# Patient Record
Sex: Female | Born: 1980 | Race: Black or African American | Hispanic: No | Smoking: Current every day smoker
Health system: Southern US, Community
[De-identification: ages and names within clinical notes are randomized; demographics above are authoritative.]

## PROBLEM LIST (undated history)

## (undated) ENCOUNTER — Inpatient Hospital Stay (HOSPITAL_COMMUNITY): Payer: Self-pay

## (undated) DIAGNOSIS — M1990 Unspecified osteoarthritis, unspecified site: Secondary | ICD-10-CM

## (undated) DIAGNOSIS — I10 Essential (primary) hypertension: Secondary | ICD-10-CM

## (undated) DIAGNOSIS — IMO0001 Reserved for inherently not codable concepts without codable children: Secondary | ICD-10-CM

## (undated) DIAGNOSIS — B009 Herpesviral infection, unspecified: Secondary | ICD-10-CM

## (undated) DIAGNOSIS — F419 Anxiety disorder, unspecified: Secondary | ICD-10-CM

## (undated) DIAGNOSIS — E119 Type 2 diabetes mellitus without complications: Secondary | ICD-10-CM

## (undated) DIAGNOSIS — E282 Polycystic ovarian syndrome: Secondary | ICD-10-CM

## (undated) DIAGNOSIS — F17200 Nicotine dependence, unspecified, uncomplicated: Secondary | ICD-10-CM

## (undated) DIAGNOSIS — N83202 Unspecified ovarian cyst, left side: Secondary | ICD-10-CM

## (undated) DIAGNOSIS — O139 Gestational [pregnancy-induced] hypertension without significant proteinuria, unspecified trimester: Secondary | ICD-10-CM

## (undated) HISTORY — PX: WISDOM TOOTH EXTRACTION: SHX21

## (undated) HISTORY — DX: Polycystic ovarian syndrome: E28.2

---

## 2004-05-29 HISTORY — PX: DILATION AND CURETTAGE OF UTERUS: SHX78

## 2008-02-25 ENCOUNTER — Emergency Department (HOSPITAL_COMMUNITY): Admission: EM | Admit: 2008-02-25 | Discharge: 2008-02-26 | Payer: Self-pay | Admitting: *Deleted

## 2009-01-13 ENCOUNTER — Encounter: Admission: RE | Admit: 2009-01-13 | Discharge: 2009-01-13 | Payer: Self-pay | Admitting: Emergency Medicine

## 2009-01-19 ENCOUNTER — Encounter: Admission: RE | Admit: 2009-01-19 | Discharge: 2009-01-19 | Payer: Self-pay | Admitting: Emergency Medicine

## 2009-09-07 ENCOUNTER — Inpatient Hospital Stay (HOSPITAL_COMMUNITY): Admission: AD | Admit: 2009-09-07 | Discharge: 2009-09-07 | Payer: Self-pay | Admitting: Obstetrics and Gynecology

## 2009-10-05 ENCOUNTER — Ambulatory Visit (HOSPITAL_COMMUNITY): Admission: RE | Admit: 2009-10-05 | Discharge: 2009-10-05 | Payer: Self-pay | Admitting: Obstetrics and Gynecology

## 2009-10-08 ENCOUNTER — Encounter: Admission: RE | Admit: 2009-10-08 | Discharge: 2009-10-08 | Payer: Self-pay | Admitting: Obstetrics and Gynecology

## 2009-11-03 ENCOUNTER — Ambulatory Visit (HOSPITAL_COMMUNITY): Admission: RE | Admit: 2009-11-03 | Discharge: 2009-11-03 | Payer: Self-pay | Admitting: Obstetrics and Gynecology

## 2009-12-01 ENCOUNTER — Ambulatory Visit (HOSPITAL_COMMUNITY): Admission: RE | Admit: 2009-12-01 | Discharge: 2009-12-01 | Payer: Self-pay | Admitting: Obstetrics and Gynecology

## 2009-12-08 ENCOUNTER — Encounter: Admission: RE | Admit: 2009-12-08 | Discharge: 2009-12-21 | Payer: Self-pay | Admitting: Obstetrics and Gynecology

## 2010-01-04 ENCOUNTER — Ambulatory Visit (HOSPITAL_COMMUNITY): Admission: RE | Admit: 2010-01-04 | Discharge: 2010-01-04 | Payer: Self-pay | Admitting: Obstetrics and Gynecology

## 2010-02-01 ENCOUNTER — Ambulatory Visit (HOSPITAL_COMMUNITY): Admission: RE | Admit: 2010-02-01 | Discharge: 2010-02-01 | Payer: Self-pay | Admitting: Obstetrics and Gynecology

## 2010-02-04 ENCOUNTER — Inpatient Hospital Stay (HOSPITAL_COMMUNITY): Admission: AD | Admit: 2010-02-04 | Discharge: 2010-02-19 | Payer: Self-pay | Admitting: Obstetrics and Gynecology

## 2010-02-14 ENCOUNTER — Ambulatory Visit (HOSPITAL_COMMUNITY): Admission: RE | Admit: 2010-02-14 | Discharge: 2010-02-14 | Payer: Self-pay | Admitting: Obstetrics and Gynecology

## 2010-02-21 ENCOUNTER — Inpatient Hospital Stay (HOSPITAL_COMMUNITY): Admission: AD | Admit: 2010-02-21 | Discharge: 2010-02-21 | Payer: Self-pay | Admitting: Obstetrics and Gynecology

## 2010-03-01 ENCOUNTER — Inpatient Hospital Stay (HOSPITAL_COMMUNITY): Admission: AD | Admit: 2010-03-01 | Discharge: 2010-03-01 | Payer: Self-pay | Admitting: Obstetrics and Gynecology

## 2010-03-05 ENCOUNTER — Encounter (INDEPENDENT_AMBULATORY_CARE_PROVIDER_SITE_OTHER): Payer: Self-pay | Admitting: Obstetrics and Gynecology

## 2010-03-05 ENCOUNTER — Inpatient Hospital Stay (HOSPITAL_COMMUNITY): Admission: AD | Admit: 2010-03-05 | Discharge: 2010-03-07 | Payer: Self-pay | Admitting: Obstetrics and Gynecology

## 2010-06-18 ENCOUNTER — Encounter: Payer: Self-pay | Admitting: Obstetrics and Gynecology

## 2010-06-19 ENCOUNTER — Encounter: Payer: Self-pay | Admitting: Obstetrics and Gynecology

## 2010-06-19 ENCOUNTER — Encounter: Payer: Self-pay | Admitting: Otolaryngology

## 2010-08-11 LAB — COMPREHENSIVE METABOLIC PANEL
ALT: 14 U/L (ref 0–35)
ALT: 10 U/L (ref 0–35)
ALT: 12 U/L (ref 0–35)
ALT: 18 U/L (ref 0–35)
AST: 16 U/L (ref 0–37)
AST: 14 U/L (ref 0–37)
AST: 8 U/L (ref 0–37)
Albumin: 2.7 g/dL — ABNORMAL LOW (ref 3.5–5.2)
Alkaline Phosphatase: 83 U/L (ref 39–117)
Alkaline Phosphatase: 86 U/L (ref 39–117)
BUN: 8 mg/dL (ref 6–23)
CO2: 22 mEq/L (ref 19–32)
CO2: 20 mEq/L (ref 19–32)
Calcium: 9.2 mg/dL (ref 8.4–10.5)
Calcium: 9.5 mg/dL (ref 8.4–10.5)
Calcium: 9.8 mg/dL (ref 8.4–10.5)
Chloride: 108 mEq/L (ref 96–112)
Chloride: 110 mEq/L (ref 96–112)
Creatinine, Ser: 0.38 mg/dL — ABNORMAL LOW (ref 0.4–1.2)
GFR calc Af Amer: >60 mL/min (ref >60)
GFR calc Af Amer: >60 mL/min (ref >60)
GFR calc Af Amer: >60 mL/min (ref >60)
GFR calc non Af Amer: >60 mL/min (ref >60)
GFR calc non Af Amer: >60 mL/min (ref >60)
Glucose, Bld: 142 mg/dL — ABNORMAL HIGH (ref 70–99)
Glucose, Bld: 441 mg/dL — ABNORMAL HIGH (ref 70–99)
Potassium: 3.2 mEq/L — ABNORMAL LOW (ref 3.5–5.1)
Potassium: 3.7 mEq/L (ref 3.5–5.1)
Sodium: 138 mEq/L (ref 135–145)
Sodium: 127 mEq/L — ABNORMAL LOW (ref 135–145)
Sodium: 131 mEq/L — ABNORMAL LOW (ref 135–145)
Sodium: 136 mEq/L (ref 135–145)
Total Bilirubin: 0.2 mg/dL — ABNORMAL LOW (ref 0.3–1.2)
Total Protein: 6.3 g/dL (ref 6.0–8.3)
Total Protein: 6.1 g/dL (ref 6.0–8.3)
Total Protein: 6.1 g/dL (ref 6.0–8.3)

## 2010-08-11 LAB — GLUCOSE, CAPILLARY
Glucose-Capillary: 118 mg/dL — ABNORMAL HIGH (ref 70–99)
Glucose-Capillary: 76 mg/dL (ref 70–99)
Glucose-Capillary: 78 mg/dL (ref 70–99)
Glucose-Capillary: 82 mg/dL (ref 70–99)
Glucose-Capillary: 103 mg/dL — ABNORMAL HIGH (ref 70–99)
Glucose-Capillary: 106 mg/dL — ABNORMAL HIGH (ref 70–99)
Glucose-Capillary: 113 mg/dL — ABNORMAL HIGH (ref 70–99)
Glucose-Capillary: 113 mg/dL — ABNORMAL HIGH (ref 70–99)
Glucose-Capillary: 114 mg/dL — ABNORMAL HIGH (ref 70–99)
Glucose-Capillary: 115 mg/dL — ABNORMAL HIGH (ref 70–99)
Glucose-Capillary: 117 mg/dL — ABNORMAL HIGH (ref 70–99)
Glucose-Capillary: 119 mg/dL — ABNORMAL HIGH (ref 70–99)
Glucose-Capillary: 124 mg/dL — ABNORMAL HIGH (ref 70–99)
Glucose-Capillary: 131 mg/dL — ABNORMAL HIGH (ref 70–99)
Glucose-Capillary: 132 mg/dL — ABNORMAL HIGH (ref 70–99)
Glucose-Capillary: 139 mg/dL — ABNORMAL HIGH (ref 70–99)
Glucose-Capillary: 140 mg/dL — ABNORMAL HIGH (ref 70–99)
Glucose-Capillary: 141 mg/dL — ABNORMAL HIGH (ref 70–99)
Glucose-Capillary: 142 mg/dL — ABNORMAL HIGH (ref 70–99)
Glucose-Capillary: 147 mg/dL — ABNORMAL HIGH (ref 70–99)
Glucose-Capillary: 154 mg/dL — ABNORMAL HIGH (ref 70–99)
Glucose-Capillary: 155 mg/dL — ABNORMAL HIGH (ref 70–99)
Glucose-Capillary: 161 mg/dL — ABNORMAL HIGH (ref 70–99)
Glucose-Capillary: 161 mg/dL — ABNORMAL HIGH (ref 70–99)
Glucose-Capillary: 162 mg/dL — ABNORMAL HIGH (ref 70–99)
Glucose-Capillary: 163 mg/dL — ABNORMAL HIGH (ref 70–99)
Glucose-Capillary: 170 mg/dL — ABNORMAL HIGH (ref 70–99)
Glucose-Capillary: 175 mg/dL — ABNORMAL HIGH (ref 70–99)
Glucose-Capillary: 178 mg/dL — ABNORMAL HIGH (ref 70–99)
Glucose-Capillary: 70 mg/dL (ref 70–99)
Glucose-Capillary: 81 mg/dL (ref 70–99)
Glucose-Capillary: 87 mg/dL (ref 70–99)
Glucose-Capillary: 90 mg/dL (ref 70–99)
Glucose-Capillary: 92 mg/dL (ref 70–99)
Glucose-Capillary: 94 mg/dL (ref 70–99)
Glucose-Capillary: 119 mg/dL — ABNORMAL HIGH (ref 70–99)
Glucose-Capillary: 125 mg/dL — ABNORMAL HIGH (ref 70–99)
Glucose-Capillary: 136 mg/dL — ABNORMAL HIGH (ref 70–99)
Glucose-Capillary: 138 mg/dL — ABNORMAL HIGH (ref 70–99)
Glucose-Capillary: 147 mg/dL — ABNORMAL HIGH (ref 70–99)
Glucose-Capillary: 153 mg/dL — ABNORMAL HIGH (ref 70–99)
Glucose-Capillary: 183 mg/dL — ABNORMAL HIGH (ref 70–99)
Glucose-Capillary: 190 mg/dL — ABNORMAL HIGH (ref 70–99)
Glucose-Capillary: 196 mg/dL — ABNORMAL HIGH (ref 70–99)
Glucose-Capillary: 206 mg/dL — ABNORMAL HIGH (ref 70–99)
Glucose-Capillary: 220 mg/dL — ABNORMAL HIGH (ref 70–99)
Glucose-Capillary: 252 mg/dL — ABNORMAL HIGH (ref 70–99)
Glucose-Capillary: 264 mg/dL — ABNORMAL HIGH (ref 70–99)
Glucose-Capillary: 265 mg/dL — ABNORMAL HIGH (ref 70–99)
Glucose-Capillary: 279 mg/dL — ABNORMAL HIGH (ref 70–99)
Glucose-Capillary: 301 mg/dL — ABNORMAL HIGH (ref 70–99)
Glucose-Capillary: 302 mg/dL — ABNORMAL HIGH (ref 70–99)
Glucose-Capillary: 320 mg/dL — ABNORMAL HIGH (ref 70–99)
Glucose-Capillary: 324 mg/dL — ABNORMAL HIGH (ref 70–99)
Glucose-Capillary: 333 mg/dL — ABNORMAL HIGH (ref 70–99)
Glucose-Capillary: 346 mg/dL — ABNORMAL HIGH (ref 70–99)
Glucose-Capillary: 357 mg/dL — ABNORMAL HIGH (ref 70–99)
Glucose-Capillary: 425 mg/dL — ABNORMAL HIGH (ref 70–99)

## 2010-08-11 LAB — DIFFERENTIAL
Eosinophils Absolute: 0.1 10*3/uL (ref 0.0–0.7)
Lymphocytes Relative: 18 % (ref 12–46)
Lymphs Abs: 1.6 10*3/uL (ref 0.7–4.0)
Monocytes Relative: 9 % (ref 3–12)
Neutrophils Relative %: 72 % (ref 43–77)

## 2010-08-11 LAB — URINALYSIS, ROUTINE W REFLEX MICROSCOPIC
Bilirubin Urine: NEGATIVE
Glucose, UA: >1000 mg/dL — AB
Specific Gravity, Urine: 1.025 (ref 1.005–1.030)
pH: 5.5 (ref 5.0–8.0)

## 2010-08-11 LAB — STREP B DNA PROBE

## 2010-08-11 LAB — WET PREP, GENITAL
Clue Cells Wet Prep HPF POC: NONE SEEN
Trich, Wet Prep: NONE SEEN
Yeast Wet Prep HPF POC: NONE SEEN

## 2010-08-11 LAB — FETAL FIBRONECTIN: Fetal Fibronectin: NEGATIVE

## 2010-08-11 LAB — URINE MICROSCOPIC-ADD ON

## 2010-08-11 LAB — BASIC METABOLIC PANEL
BUN: 7 mg/dL (ref 6–23)
Chloride: 108 mEq/L (ref 96–112)
Glucose, Bld: 146 mg/dL — ABNORMAL HIGH (ref 70–99)
Potassium: 3.4 mEq/L — ABNORMAL LOW (ref 3.5–5.1)

## 2010-08-11 LAB — CBC
HCT: 33 % — ABNORMAL LOW (ref 36.0–46.0)
HCT: 40.4 % (ref 36.0–46.0)
Hemoglobin: 11.1 g/dL — ABNORMAL LOW (ref 12.0–15.0)
MCHC: 33.5 g/dL (ref 30.0–36.0)
MCHC: 35.7 g/dL (ref 30.0–36.0)
MCV: 86.2 fL (ref 78.0–100.0)
RDW: 17 % — ABNORMAL HIGH (ref 11.5–15.5)
RDW: 17.2 % — ABNORMAL HIGH (ref 11.5–15.5)
RDW: 16.7 % — ABNORMAL HIGH (ref 11.5–15.5)
WBC: 11 10*3/uL — ABNORMAL HIGH (ref 4.0–10.5)

## 2010-08-11 LAB — PROTEIN, URINE, 24 HOUR
Collection Interval-UPROT: 24 hours
Urine Total Volume-UPROT: 1900 mL

## 2010-08-11 LAB — WOUND CULTURE

## 2010-08-11 LAB — LACTATE DEHYDROGENASE: LDH: 164 U/L (ref 94–250)

## 2010-08-11 LAB — AMYLASE: Amylase: 62 U/L (ref 0–105)

## 2010-08-16 LAB — BASIC METABOLIC PANEL
CO2: 24 mEq/L (ref 19–32)
Chloride: 104 mEq/L (ref 96–112)
GFR calc non Af Amer: >60 mL/min (ref >60)
Glucose, Bld: 212 mg/dL — ABNORMAL HIGH (ref 70–99)
Potassium: 3.5 mEq/L (ref 3.5–5.1)
Sodium: 134 mEq/L — ABNORMAL LOW (ref 135–145)

## 2010-08-16 LAB — CBC
HCT: 35.3 % — ABNORMAL LOW (ref 36.0–46.0)
Hemoglobin: 12 g/dL (ref 12.0–15.0)
MCHC: 34 g/dL (ref 30.0–36.0)
RDW: 16.3 % — ABNORMAL HIGH (ref 11.5–15.5)

## 2010-08-16 LAB — GLUCOSE, CAPILLARY

## 2010-08-17 LAB — WET PREP, GENITAL
Trich, Wet Prep: NONE SEEN
Yeast Wet Prep HPF POC: NONE SEEN

## 2010-08-21 ENCOUNTER — Inpatient Hospital Stay (HOSPITAL_COMMUNITY)
Admission: AD | Admit: 2010-08-21 | Discharge: 2010-08-22 | Disposition: A | Discharge status: Home / Self Care | Payer: 59 | Source: Ambulatory Visit | Attending: Obstetrics and Gynecology | Admitting: Obstetrics and Gynecology

## 2010-08-21 DIAGNOSIS — I10 Essential (primary) hypertension: Secondary | ICD-10-CM | POA: Insufficient documentation

## 2010-08-21 DIAGNOSIS — K5900 Constipation, unspecified: Secondary | ICD-10-CM | POA: Insufficient documentation

## 2010-08-21 DIAGNOSIS — G44209 Tension-type headache, unspecified, not intractable: Secondary | ICD-10-CM | POA: Insufficient documentation

## 2010-08-21 LAB — URINALYSIS, ROUTINE W REFLEX MICROSCOPIC
Glucose, UA: NEGATIVE mg/dL
Ketones, ur: NEGATIVE mg/dL
Leukocytes, UA: NEGATIVE
pH: 5.5 (ref 5.0–8.0)

## 2010-08-21 LAB — URINE MICROSCOPIC-ADD ON

## 2010-08-21 LAB — POCT PREGNANCY, URINE: Preg Test, Ur: NEGATIVE

## 2010-08-22 LAB — CBC
HCT: 40.2 % (ref 36.0–46.0)
MCHC: 32.8 g/dL (ref 30.0–36.0)
MCV: 83.9 fL (ref 78.0–100.0)
Platelets: 225 10*3/uL (ref 150–400)
RDW: 15 % (ref 11.5–15.5)

## 2010-08-22 LAB — COMPREHENSIVE METABOLIC PANEL
BUN: 10 mg/dL (ref 6–23)
Calcium: 9.3 mg/dL (ref 8.4–10.5)
Creatinine, Ser: 0.68 mg/dL (ref 0.4–1.2)
Glucose, Bld: 114 mg/dL — ABNORMAL HIGH (ref 70–99)
Total Protein: 7.3 g/dL (ref 6.0–8.3)

## 2010-08-22 LAB — WET PREP, GENITAL
Trich, Wet Prep: NONE SEEN
Yeast Wet Prep HPF POC: NONE SEEN

## 2010-08-22 LAB — LIPASE, BLOOD: Lipase: 31 U/L (ref 11–59)

## 2010-08-22 LAB — AMYLASE: Amylase: 52 U/L (ref 0–105)

## 2010-08-23 LAB — GC/CHLAMYDIA PROBE AMP, GENITAL
Chlamydia, DNA Probe: NEGATIVE
GC Probe Amp, Genital: NEGATIVE

## 2011-01-15 ENCOUNTER — Emergency Department (HOSPITAL_COMMUNITY)
Admission: EM | Admit: 2011-01-15 | Discharge: 2011-01-15 | Disposition: A | Discharge status: Home / Self Care | Payer: No Typology Code available for payment source | Attending: Emergency Medicine | Admitting: Emergency Medicine

## 2011-01-15 DIAGNOSIS — R51 Headache: Secondary | ICD-10-CM | POA: Insufficient documentation

## 2011-02-27 LAB — POCT I-STAT, CHEM 8
BUN: 9
Calcium, Ion: 1.21
Chloride: 108
Creatinine, Ser: 1
Glucose, Bld: 92

## 2011-02-27 LAB — HEMOGLOBIN AND HEMATOCRIT, BLOOD: Hemoglobin: 13.5

## 2011-02-27 LAB — APTT: aPTT: 34

## 2011-05-21 ENCOUNTER — Encounter (HOSPITAL_COMMUNITY): Payer: Self-pay | Admitting: *Deleted

## 2011-05-21 ENCOUNTER — Inpatient Hospital Stay (HOSPITAL_COMMUNITY)
Admission: AD | Admit: 2011-05-21 | Discharge: 2011-05-21 | Disposition: A | Discharge status: Home / Self Care | Payer: Medicaid Other | Source: Ambulatory Visit | Attending: Obstetrics and Gynecology | Admitting: Obstetrics and Gynecology

## 2011-05-21 DIAGNOSIS — N949 Unspecified condition associated with female genital organs and menstrual cycle: Secondary | ICD-10-CM | POA: Insufficient documentation

## 2011-05-21 DIAGNOSIS — N938 Other specified abnormal uterine and vaginal bleeding: Secondary | ICD-10-CM | POA: Insufficient documentation

## 2011-05-21 DIAGNOSIS — A5901 Trichomonal vulvovaginitis: Secondary | ICD-10-CM | POA: Insufficient documentation

## 2011-05-21 DIAGNOSIS — A599 Trichomoniasis, unspecified: Secondary | ICD-10-CM

## 2011-05-21 HISTORY — DX: Essential (primary) hypertension: I10

## 2011-05-21 HISTORY — DX: Gestational (pregnancy-induced) hypertension without significant proteinuria, unspecified trimester: O13.9

## 2011-05-21 HISTORY — DX: Herpesviral infection, unspecified: B00.9

## 2011-05-21 LAB — URINALYSIS, ROUTINE W REFLEX MICROSCOPIC
Bilirubin Urine: NEGATIVE
Glucose, UA: NEGATIVE mg/dL
Ketones, ur: NEGATIVE mg/dL
Leukocytes, UA: NEGATIVE
Nitrite: NEGATIVE
Protein, ur: NEGATIVE mg/dL
Specific Gravity, Urine: >1.03 — ABNORMAL HIGH (ref 1.005–1.030)
Urobilinogen, UA: 0.2 mg/dL (ref 0.0–1.0)
pH: 6 (ref 5.0–8.0)

## 2011-05-21 LAB — CBC
HCT: 42.4 % (ref 36.0–46.0)
MCHC: 32.3 g/dL (ref 30.0–36.0)
MCV: 85 fL (ref 78.0–100.0)
RDW: 14.8 % (ref 11.5–15.5)

## 2011-05-21 LAB — WET PREP, GENITAL

## 2011-05-21 LAB — HCG, SERUM, QUALITATIVE: Preg, Serum: NEGATIVE

## 2011-05-21 MED ORDER — METRONIDAZOLE 500 MG PO TABS: 500.0000 mg | ORAL_TABLET | Freq: Two times a day (BID) | ORAL | Status: AC

## 2011-05-21 NOTE — ED Provider Notes (Signed)
History     Chief Complaint  Patient presents with  . Vaginal Bleeding   HPI  Pt is not pregnant and is concerned that she started her period this month one week early on Dec 13 only spotting since then ; she has had mild cramps.  She is concerned that she might be pregnant.  She took 2 days ago- one negative 2 days ago and faint positive yesterday.  She denies vaginal discharge.  She had intercourse 2 days ago without pain.  She denies pain with urination or constipation or diarrhea.  She feels like she is breaking out in a sweat- which she felt like when she was pregnant with her daughter.   She is a pt of [facility]- last seen November because she was one week late for her period.  She had a serum blood test that was negative.  She has a history of PCOS with irregular periods every 3 months but has has had regular periods since Oct 2011 when her daughter was born. She had gestational diabetes.  She has a history of taking Metformin prior to her pregnancy and then through her pregnancy.   She has gained about 30 to 40 lbs since her daughter was born.  She also complains of being extra tired.  She has an appointment on Friday Dec 28 with [facility].  Past Medical History  Diagnosis Date  . Gestational diabetes   . Hypertension   . Pregnancy induced hypertension   . Herpes     Past Surgical History  Procedure Date  . Cesarean section   . Wisdom tooth extraction     Family History  Problem Relation Age of Onset  . Hypertension Mother   . Diabetes Father   . Stroke Father   . Cancer Maternal Uncle   . Cancer Maternal Grandmother     History  Substance Use Topics  . Smoking status: Current Everyday Smoker -- 0.5 packs/day for 13 years    Types: Cigarettes  . Smokeless tobacco: Not on file  . Alcohol Use: Yes     socially and rarely    Allergies: Allergies not on file  No prescriptions prior to admission    ROS Physical Exam   Blood pressure 157/86, pulse 100, temperature 98.7 F  (37.1 C), temperature source Oral, resp. rate 18, height 5' 7.5" (1.715 m), weight 310 lb (140.615 kg), last menstrual period 04/19/2011, SpO2 96.00%.  Physical Exam  Nursing note and vitals reviewed. Constitutional: She is oriented to person, place, and time. She appears well-developed and well-nourished.  Eyes: Pupils are equal, round, and reactive to light.  Neck: Normal range of motion. Neck supple.  Respiratory: Effort normal.  GI: Soft. She exhibits no distension and no mass. There is no tenderness. There is no rebound and no guarding.  Genitourinary:       Small amount of frothy blood in vault; cervix clean, NT; uterus nontender- unable to appreciate size due to habitus; adnexa without palpable enlargement; nonender  Musculoskeletal: Normal range of motion.  Neurological: She is alert and oriented to person, place, and time.  Skin: Skin is warm and dry.  Psychiatric: She has a normal mood and affect.    MAU Course  Procedures Urine pregnancy test negative but since pt has had a faint positive pregnancy test, a serum pregnancy test was ordered CBC Wet prep GC/chlamdyia-pending Care turned over to [facility], [place] G/chlm Assessment and Plan    LINEBERRY,SUSAN 05/21/2011, 8:01 PM   I accepted  care of this pt from Pamelia Hoit, FNP.  Results for orders placed during the hospital encounter of 05/21/11 (from the past 24 hour(s))  URINALYSIS, ROUTINE W REFLEX MICROSCOPIC     Status: Abnormal   Collection Time   05/21/11  7:40 PM      Component Value Range   Color, Urine YELLOW  YELLOW    APPearance CLEAR  CLEAR    Specific Gravity, Urine >1.030 (*) 1.005 - 1.030    pH 6.0  5.0 - 8.0    Glucose, UA NEGATIVE  NEGATIVE (mg/dL)   Hgb urine dipstick MODERATE (*) NEGATIVE    Bilirubin Urine NEGATIVE  NEGATIVE    Ketones, ur NEGATIVE  NEGATIVE (mg/dL)   Protein, ur NEGATIVE  NEGATIVE (mg/dL)   Urobilinogen, UA 0.2  0.0 - 1.0 (mg/dL)   Nitrite NEGATIVE  NEGATIVE     Leukocytes, UA NEGATIVE  NEGATIVE   URINE MICROSCOPIC-ADD ON     Status: Abnormal   Collection Time   05/21/11  7:40 PM      Component Value Range   Squamous Epithelial / LPF FEW (*) RARE    WBC, UA 0-2  <3 (WBC/hpf)   RBC / HPF 3-6  <3 (RBC/hpf)   Bacteria, UA FEW (*) RARE   POCT PREGNANCY, URINE     Status: Normal   Collection Time   05/21/11  8:00 PM      Component Value Range   Preg Test, Ur NEGATIVE    HCG, SERUM, QUALITATIVE     Status: Normal   Collection Time   05/21/11  8:26 PM      Component Value Range   Preg, Serum NEGATIVE  NEGATIVE   CBC     Status: Normal   Collection Time   05/21/11  8:26 PM      Component Value Range   WBC 8.7  4.0 - 10.5 (K/uL)   RBC 4.99  3.87 - 5.11 (MIL/uL)   Hemoglobin 13.7  12.0 - 15.0 (g/dL)   HCT 16.1  09.6 - 04.5 (%)   MCV 85.0  78.0 - 100.0 (fL)   MCH 27.5  26.0 - 34.0 (pg)   MCHC 32.3  30.0 - 36.0 (g/dL)   RDW 40.9  81.1 - 91.4 (%)   Platelets 203  150 - 400 (K/uL)  WET PREP, GENITAL     Status: Abnormal   Collection Time   05/21/11  8:40 PM      Component Value Range   Yeast, Wet Prep NONE SEEN  NONE SEEN    Trich, Wet Prep FEW (*) NONE SEEN    Clue Cells, Wet Prep MODERATE (*) NONE SEEN    WBC, Wet Prep HPF POC FEW (*) NONE SEEN      A/P: 1) Trichomonas: discussed with pt at length. Will give Rx for Flagyl. Warned of antabuse reaction. She has f/u scheduled with Dr. Cloretta Ned office. Discussed safe sex practices. Discussed diet, activity, risks, and precautions.  Clinton Gallant. Hatcher Froning III, DrHSc, MPAS, PA-C   Henrietta Hoover, [place] 05/21/11 2121

## 2011-05-21 NOTE — Progress Notes (Signed)
Last normal period was 18 Apr 2011, has been spotting with light cramping for the past 10 days.

## 2011-05-21 NOTE — Progress Notes (Signed)
Pt states she has had spotting for the last week-she also has nausea at times followed by sweating and tingling in her fingers

## 2011-11-01 ENCOUNTER — Emergency Department (HOSPITAL_COMMUNITY)
Admission: EM | Admit: 2011-11-01 | Discharge: 2011-11-01 | Disposition: A | Discharge status: Home / Self Care | Payer: Self-pay | Attending: Emergency Medicine | Admitting: Emergency Medicine

## 2011-11-01 ENCOUNTER — Encounter (HOSPITAL_COMMUNITY): Payer: Self-pay | Admitting: *Deleted

## 2011-11-01 DIAGNOSIS — R7309 Other abnormal glucose: Secondary | ICD-10-CM | POA: Insufficient documentation

## 2011-11-01 DIAGNOSIS — N643 Galactorrhea not associated with childbirth: Secondary | ICD-10-CM

## 2011-11-01 LAB — DIFFERENTIAL
Basophils Absolute: 0 10*3/uL (ref 0.0–0.1)
Eosinophils Relative: 1 % (ref 0–5)
Lymphocytes Relative: 30 % (ref 12–46)
Lymphs Abs: 2.6 10*3/uL (ref 0.7–4.0)
Neutro Abs: 5.5 10*3/uL (ref 1.7–7.7)

## 2011-11-01 LAB — BASIC METABOLIC PANEL
CO2: 27 mEq/L (ref 19–32)
Chloride: 104 mEq/L (ref 96–112)
Creatinine, Ser: 0.66 mg/dL (ref 0.50–1.10)
GFR calc Af Amer: >90 mL/min (ref >90)
Potassium: 4.1 mEq/L (ref 3.5–5.1)
Sodium: 138 mEq/L (ref 135–145)

## 2011-11-01 LAB — URINALYSIS, ROUTINE W REFLEX MICROSCOPIC
Glucose, UA: NEGATIVE mg/dL
Leukocytes, UA: NEGATIVE
Nitrite: NEGATIVE
Specific Gravity, Urine: 1.024 (ref 1.005–1.030)
pH: 5.5 (ref 5.0–8.0)

## 2011-11-01 LAB — CBC
HCT: 39.9 % (ref 36.0–46.0)
Hemoglobin: 13.3 g/dL (ref 12.0–15.0)
MCH: 28.1 pg (ref 26.0–34.0)
MCHC: 33.3 g/dL (ref 30.0–36.0)
Platelets: 238 10*3/uL (ref 150–400)

## 2011-11-01 LAB — URINE MICROSCOPIC-ADD ON

## 2011-11-01 LAB — GLUCOSE, CAPILLARY: Glucose-Capillary: 112 mg/dL — ABNORMAL HIGH (ref 70–99)

## 2011-11-01 LAB — POCT PREGNANCY, URINE: Preg Test, Ur: NEGATIVE

## 2011-11-01 MED ORDER — SODIUM CHLORIDE 0.9 % IV SOLN: 1000.0000 mL | Freq: Once | INTRAVENOUS | Status: DC

## 2011-11-01 MED ORDER — SODIUM CHLORIDE 0.9 % IV SOLN: 1000.0000 mL | INTRAVENOUS | Status: DC

## 2011-11-01 MED ORDER — SODIUM CHLORIDE 0.9 % IV BOLUS (SEPSIS): 1000.0000 mL | Freq: Once | INTRAVENOUS | Status: DC

## 2011-11-01 NOTE — ED Notes (Signed)
Pt reports taking her BG this am after eating cereal and was elevated, denies hx of DM

## 2011-11-01 NOTE — ED Provider Notes (Signed)
History     CSN: [id number]  Arrival date & time 11/01/11  1019   First MD Initiated Contact with Patient 11/01/11 1021      Chief Complaint  Patient presents with  . Hyperglycemia    (Consider location/radiation/quality/duration/timing/severity/associated sxs/prior treatment) HPI Comments: Patient is a 31 year old female with a history of gestational diabetes and hypertension presents to the emergency department with a chief complaint of hyperglycemia.  Patient states that she checked her blood sugar after eating cereal this morning and that it was 214 which made her nervous and she decided to come to the emergency department.  Patient denies any symptoms including abdominal pain, nausea, vomiting, nocturia, dysuria, urinary frequency, back pain, vaginal discharge, or abdominal cramping.  They're been no changes in her bowel movements and she genuinely feels asymptomatic.  Patient also denies shortness of breath, chest pain, fevers night sweats or chills.  Patient states she did not have a PCP in requests a resource guide at discharge.  The history is provided by the patient.    Past Medical History  Diagnosis Date  . Gestational diabetes   . Hypertension   . Pregnancy induced hypertension   . Herpes     Past Surgical History  Procedure Date  . Cesarean section   . Wisdom tooth extraction     Family History  Problem Relation Age of Onset  . Hypertension Mother   . Diabetes Father   . Stroke Father   . Cancer Maternal Uncle   . Cancer Maternal Grandmother     History  Substance Use Topics  . Smoking status: Current Everyday Smoker -- 0.5 packs/day for 13 years    Types: Cigarettes  . Smokeless tobacco: Not on file  . Alcohol Use: Yes     socially and rarely    OB History    Grav Para Term Preterm Abortions TAB SAB Ect Mult Living   4 2  2 1  1   2       Review of Systems  All other systems reviewed and are negative.    Allergies  Review of patient's  allergies indicates no known allergies.  Home Medications   No current outpatient prescriptions on file.  BP 159/88  Pulse 77  Temp(Src) 98.6 F (37 C) (Oral)  Resp 20  SpO2 98%  Physical Exam  Nursing note and vitals reviewed. Constitutional: She is oriented to person, place, and time. She appears well-developed and well-nourished.       Morbidly obese patient that is nondiaphoretic in no acute distress.  Patient sitting and resting comfortably.  HENT:  Head: Normocephalic and atraumatic.  Eyes: Conjunctivae and EOM are normal.  Neck: Normal range of motion.  Cardiovascular:       Regular rate and rhythm, no aberrancy and auscultation, intact distal pulses.  Pulmonary/Chest: Effort normal.  Abdominal:       Obese, soft, nontender abdomen with bowel sounds present.  Musculoskeletal: Normal range of motion.  Neurological: She is alert and oriented to person, place, and time.  Skin: Skin is warm and dry. No rash noted.  Psychiatric: She has a normal mood and affect. Her behavior is normal.    ED Course  Procedures (including critical care time)  Labs Reviewed  URINALYSIS, ROUTINE W REFLEX MICROSCOPIC - Abnormal; Notable for the following:    Hgb urine dipstick TRACE (*)    All other components within normal limits  BASIC METABOLIC PANEL - Abnormal; Notable for the following:    Glucose,  Bld 117 (*)    All other components within normal limits  GLUCOSE, CAPILLARY - Abnormal; Notable for the following:    Glucose-Capillary 112 (*)    All other components within normal limits  URINE MICROSCOPIC-ADD ON - Abnormal; Notable for the following:    Squamous Epithelial / LPF FEW (*)    Bacteria, UA FEW (*)    Crystals URIC ACID CRYSTALS (*)    All other components within normal limits  CBC  DIFFERENTIAL  POCT PREGNANCY, URINE  HEMOGLOBIN A1C   No results found.   No diagnosis found.    MDM  Hyperglycemia, asymptomatic   Patient presents emergency department for  hyperglycemia, asymptomatic throughout her hospital stay. Labs reviewed, A1C pending. She did not have a history of diabetes.  It was recommended that the patient followup with a primary care provider.  Patient has been asymptomatic throughout stay and is medically stable in no acute distress.  Patient will be discharged with resource guide.      Jaci Carrel, [place] 11/01/11 1239

## 2011-11-01 NOTE — Discharge Instructions (Signed)
RESOURCE GUIDE  Chronic Pain Problems: Contact [facility]  [phone] Patients need to be referred by their primary care doctor.  Insufficient Money for Medicine: Contact United Way:  call "211" or [facility] [phone].  No Primary Care Doctor: - Call [facility]  [phone] - can help you locate a primary care doctor that  accepts your insurance, provides certain services, etc. - Physician Referral Service- 1-800-533-3463  Agencies that provide inexpensive medical care: - [facility]  [phone] - [facility]  [phone] - [facility]  [phone] - [facility] Clinic  [phone] - Planned Parenthood  [phone] - [facility]  [phone]  Medicaid-accepting [place] Providers: - [facility]- [address]  [phone], Mon-Fri 9am-7pm, Sat 9am-1pm - [facility]- [address], [address]  [phone] - [facility]- [address], [address]  [phone] - [facility]- [address] [place] [address]  [phone] - Veita [facility]- [address], [address], [phone]  Only accepts [place] Access Medicaid patients after they have their name  applied to their card  Self Pay (no insurance) in [place]: - Sickle Cell Patients: Dr Eric Dean, [facility]  [address], [phone] - [facility]- [address]  [phone]       -     [facility]- [address]       -     [facility]- see information above (Speak to Pam H if you do not have insurance)       -  [facility]- [address], [phone]       -  [facility] [place]- [address],  [phone]       -  [facility]- [address] [place] [address], [phone]       -  Dr Osei-Bonsu-  [address], [place], [phone]       -  [facility]- [address], [phone]       -  [facility]- [address] [place] [address], [phone], also [address], [phone]       -    [facility]- [address], [phone], 1st & 3rd Saturday   every month, 10am-1pm  1) Find a Doctor and Pay Out of Pocket Although you won't have to find out who is covered by your insurance plan, it is a good idea to ask around and get recommendations. You will then need to call the office and see if the doctor you have chosen will accept you as a new patient and what types of options they offer for patients who are self-pay. Some doctors offer discounts or will set up payment plans for their patients who do not have insurance, but you will need to ask so you aren't surprised when you get to your appointment.  2) Contact Your Local Health Department Not all health departments have doctors that can see patients for sick visits, but many do, so it is worth a call to see if yours does. If you don't know where your local health department is, you can check in your phone book. The CDC also has a tool to help you locate your state's health department, and many state websites also have   listings of all of their local health departments.  3) Find a Walk-in Clinic If your illness is not likely to be very severe or complicated, you may want to try a walk in clinic. These are popping up all over the country in pharmacies, drugstores, and shopping centers. They're usually staffed by nurse practitioners or physician assistants that have been trained to treat common illnesses and complaints. They're usually fairly quick and inexpensive. However, if you have serious medical issues or chronic medical problems, these are probably not your best option  STD Testing - [place] [facility] [place], [facility] Clinic, [address], [place], phone [phone] or 1-877-539-9860.  Monday - Friday, call for an appointment. - [place]  [facility] [place], [facility] Clinic, [address], [place], phone [phone] or 1-877-539-9860.  Monday - Friday, call for an appointment.  Abuse/Neglect: - [place] Child Abuse Hotline (336) 641-3795 - [place] Child Abuse Hotline 800-378-5315 (After Hours)  Emergency Shelter:  [facility] (336) 271-5985  Maternity Homes: - [facility] (336) 275-9566 - [facility] (704) 372-4663  MRSA Hotline #:   832-7006  [place] Resources  [facility] [place]  [employer] [place] [facility] [address]                 [address]         [address]  [place]                                               [place]                              [place] Phone:  [phone]                                  Phone:  [phone]                   Phone:  [phone]  [place] [facility], [phone] - [place] Services - [facility]- 1-888-581-9988       -     [facility] in [place], [address],                                  336-349-4454, Insurance  [place] Child Abuse Hotline (336) 342-1394 or (336) 342-3537 (After Hours)   Behavioral Health Services  Substance Abuse Resources: - [facility]  336-882-2125 - [facility] 336-784-9470 - [facility] 336-285-9073 - [facility] 336-845-3988 - Residential & Outpatient Substance Abuse Program  800-659-3381  Psychological Services: - [facility]  [phone] - [facility]  [phone] - [place] [facility], [address], [place], ACCESS LINE: 1-800-853-5163 or 336-641-4981, Http://www.[facility].com/services/adult.htm  Dental Assistance  If unable to pay or uninsured, contact:  [facility] or [place] [facility] to become qualified for the adult dental  clinic.  Patients with Medicaid: [facility] [address], [phone], [phone]  If unable   to pay, or uninsured, contact [facility] ([phone]) or [place] [facility] ([phone] in [place], [phone] in [place]) to become qualified for the adult dental clinic  Other Low-Cost Community Dental Services: - [facility]- [address], [place], [place], [address], [phone], Ext. 123, 2nd and 4th Thursday of the month at 6:30am.  10 clients each day by appointment, can sometimes see walk-in patients if someone does not show for an appointment. - [facility]- [address], [place], [place], [address], [phone] - [facility]- [address], [place], [place], [address], [phone] - [place] [facility]- [phone] - [facility]- [phone] - [facility]- [phone]    

## 2011-11-02 LAB — HEMOGLOBIN A1C
Hgb A1c MFr Bld: 6.7 % — ABNORMAL HIGH (ref <5.7)
Mean Plasma Glucose: 146 mg/dL — ABNORMAL HIGH (ref <117)

## 2011-11-04 NOTE — ED Provider Notes (Signed)
History/physical exam/procedure(s) were performed by non-physician practitioner and as supervising physician I was immediately available for consultation/collaboration. I have reviewed all notes and am in agreement with care and plan.   Hilario Quarry, MD 11/04/11 [phone]

## 2011-11-28 ENCOUNTER — Encounter (HOSPITAL_COMMUNITY): Payer: Self-pay | Admitting: *Deleted

## 2011-11-28 ENCOUNTER — Inpatient Hospital Stay (HOSPITAL_COMMUNITY)
Admission: AD | Admit: 2011-11-28 | Discharge: 2011-11-29 | Disposition: A | Discharge status: Home / Self Care | Payer: Self-pay | Source: Ambulatory Visit | Attending: Obstetrics and Gynecology | Admitting: Obstetrics and Gynecology

## 2011-11-28 DIAGNOSIS — N83209 Unspecified ovarian cyst, unspecified side: Secondary | ICD-10-CM | POA: Insufficient documentation

## 2011-11-28 DIAGNOSIS — B9689 Other specified bacterial agents as the cause of diseases classified elsewhere: Secondary | ICD-10-CM | POA: Insufficient documentation

## 2011-11-28 DIAGNOSIS — I10 Essential (primary) hypertension: Secondary | ICD-10-CM | POA: Insufficient documentation

## 2011-11-28 DIAGNOSIS — E282 Polycystic ovarian syndrome: Secondary | ICD-10-CM

## 2011-11-28 DIAGNOSIS — A499 Bacterial infection, unspecified: Secondary | ICD-10-CM | POA: Insufficient documentation

## 2011-11-28 DIAGNOSIS — R109 Unspecified abdominal pain: Secondary | ICD-10-CM | POA: Insufficient documentation

## 2011-11-28 DIAGNOSIS — N83202 Unspecified ovarian cyst, left side: Secondary | ICD-10-CM

## 2011-11-28 DIAGNOSIS — N760 Acute vaginitis: Secondary | ICD-10-CM | POA: Insufficient documentation

## 2011-11-28 DIAGNOSIS — R1084 Generalized abdominal pain: Secondary | ICD-10-CM

## 2011-11-28 DIAGNOSIS — D72829 Elevated white blood cell count, unspecified: Secondary | ICD-10-CM | POA: Insufficient documentation

## 2011-11-28 DIAGNOSIS — E119 Type 2 diabetes mellitus without complications: Secondary | ICD-10-CM | POA: Insufficient documentation

## 2011-11-28 HISTORY — DX: Nicotine dependence, unspecified, uncomplicated: F17.200

## 2011-11-28 HISTORY — DX: Unspecified ovarian cyst, left side: N83.202

## 2011-11-28 LAB — URINALYSIS, ROUTINE W REFLEX MICROSCOPIC
Specific Gravity, Urine: >1.03 — ABNORMAL HIGH (ref 1.005–1.030)
Urobilinogen, UA: 0.2 mg/dL (ref 0.0–1.0)
pH: 5.5 (ref 5.0–8.0)

## 2011-11-28 LAB — URINE MICROSCOPIC-ADD ON

## 2011-11-28 LAB — POCT PREGNANCY, URINE: Preg Test, Ur: NEGATIVE

## 2011-11-28 NOTE — MAU Note (Signed)
"  i have been having real bad pains in my stomach for the last month". Worsening today per pt, states it is upper, lower, and radiating to her back. Nausea and vomiting today, dysuria. LMP  11/21/2011

## 2011-11-29 ENCOUNTER — Inpatient Hospital Stay (HOSPITAL_COMMUNITY): Payer: Self-pay

## 2011-11-29 ENCOUNTER — Encounter (HOSPITAL_COMMUNITY): Payer: Self-pay

## 2011-11-29 DIAGNOSIS — F17200 Nicotine dependence, unspecified, uncomplicated: Secondary | ICD-10-CM

## 2011-11-29 DIAGNOSIS — N83202 Unspecified ovarian cyst, left side: Secondary | ICD-10-CM

## 2011-11-29 HISTORY — DX: Unspecified ovarian cyst, left side: N83.202

## 2011-11-29 HISTORY — DX: Nicotine dependence, unspecified, uncomplicated: F17.200

## 2011-11-29 LAB — DIFFERENTIAL
Basophils Relative: 0 % (ref 0–1)
Eosinophils Absolute: 0 10*3/uL (ref 0.0–0.7)
Eosinophils Relative: 0 % (ref 0–5)
Monocytes Absolute: 0.5 10*3/uL (ref 0.1–1.0)
Monocytes Relative: 3 % (ref 3–12)

## 2011-11-29 LAB — GC/CHLAMYDIA PROBE AMP, GENITAL: Chlamydia, DNA Probe: NEGATIVE

## 2011-11-29 LAB — CBC
HCT: 40.2 % (ref 36.0–46.0)
MCHC: 32.8 g/dL (ref 30.0–36.0)
MCV: 84.5 fL (ref 78.0–100.0)
Platelets: 272 10*3/uL (ref 150–400)
RDW: 15.1 % (ref 11.5–15.5)

## 2011-11-29 MED ORDER — OXYCODONE-ACETAMINOPHEN 5-325 MG PO TABS
2.0000 | ORAL_TABLET | Freq: Once | ORAL | Status: AC
  Administered 2011-11-29: 2 via ORAL
  Filled 2011-11-29: qty 2

## 2011-11-29 MED ORDER — METRONIDAZOLE 500 MG PO TABS: 500.0000 mg | ORAL_TABLET | Freq: Two times a day (BID) | ORAL | Status: AC

## 2011-11-29 NOTE — MAU Provider Note (Signed)
History   Robyn [name] is a 31y.o. Morbidly obese BF P0222 who presents unannounced w/ CC of worsening abdominal pain over the last month.  She reports pain as "contraction-like" and when present is constant.  Pain originates in lower abdomen, varies which side, and then radiates to mid and upper abdomen.  Tried 2 Tylenol w/ last dose around 2100.  Normal appetite and no recent wt fluctuation.  Trying to lose weight and reports "walking."  No regular medications.  No UTI s/s.  Previous c/s x2, w/ h/o vertical incision w/ 1st c/s.  Oldest child is 16y.o.a. And youngest born Oct. 2011.  States," I think my PCOS is flaring up again."  Desires pregnancy (twins even).  H/o infertility despite clomid.  Reports monthly periods.  LMP 11/21/11.  No contraception.  Seen in ED 6/513 for "hyperglycemia."  Reports previous use of "Metformin," but not taking and no f/u s/p delivery for Type II DM; stopped taking med b/c of GI upset.  HgA1c=6.7 on 11/01/11.  Pt presented at that time for increased fatigue and had been checking sugars at that time and in "200's," but reports they were "normal" when she got to ED.   Hx r/f: 1.  PCOS 2.  Morbidly obese 3.  Type II DM and on insulin during last pregnancy 4.  CHTN--no meds, but aldomet last pregnancy 5.  H/o HSV positive titer (no h/o outbreak) 6.  H/o infertility 7.  H/o Positive ANA 8.  H/o PTD x2 9. H/o previous c/s x2  CSN: [id number]  Arrival date and time: 11/28/11 2311   First Provider Initiated Contact with Patient 11/29/11 0003      Chief Complaint  Patient presents with  . Abdominal Pain   HPI  OB History    Grav Para Term Preterm Abortions TAB SAB Ect Mult Living   4 2  2 1  1   2       Past Medical History  Diagnosis Date  . Gestational diabetes   . Hypertension   . Pregnancy induced hypertension   . Herpes     Past Surgical History  Procedure Date  . Cesarean section   . Wisdom tooth extraction     Family History  Problem  Relation Age of Onset  . Hypertension Mother   . Diabetes Father   . Stroke Father   . Cancer Maternal Uncle   . Cancer Maternal Grandmother     History  Substance Use Topics  . Smoking status: Current Everyday Smoker -- 0.5 packs/day for 13 years    Types: Cigarettes  . Smokeless tobacco: Not on file  . Alcohol Use: Yes     socially and rarely    Allergies: No Known Allergies  Prescriptions prior to admission  Medication Sig Dispense Refill  . acetaminophen (TYLENOL) 500 MG tablet Take 1,000 mg by mouth every 6 (six) hours as needed.        Review of Systems  Constitutional:       "cold sweat" tonight  HENT: Negative.   Eyes: Negative.   Respiratory: Negative.   Cardiovascular: Positive for leg swelling.       LLE swelling recently following trip back home from [place]; thinks related to decreased movement from drive  Gastrointestinal: Positive for nausea and abdominal pain.       No BM today; typically daily; last one yesterday and normal  Genitourinary: Negative.   Musculoskeletal: Negative.   Skin: Negative.   Neurological: Negative.    Physical  Exam   Blood pressure 146/84, pulse 92, temperature 99.6 F (37.6 C), temperature source Oral, resp. rate 20, height 5\' 7"  (1.702 m), weight 314 lb (142.429 kg), last menstrual period 11/21/2011, SpO2 98.00%.  Physical Exam  Constitutional: She is oriented to person, place, and time. She appears well-developed. No distress.       Morbidly obese  HENT:  Head: Normocephalic and atraumatic.  Eyes: Pupils are equal, round, and reactive to light.  Cardiovascular: Normal rate.   Respiratory: Effort normal.  GI: Soft. Bowel sounds are normal. She exhibits no distension. There is no tenderness. There is no rebound and no guarding.       Large pannus and difficulty assessing for organomegaly.  Genitourinary:       Scant white d/c in vault; positive whiff.  No VB; no lesions; unable to assess adnexa & ovaries secondary to  habitus. No CMT.  Neurological: She is alert and oriented to person, place, and time.  Skin: Skin is warm and dry.   *RADIOLOGY REPORT*  Clinical Data: Dysuria, abdominal pain. History of polycystic  ovarian syndrome.  TRANSABDOMINAL AND TRANSVAGINAL ULTRASOUND OF PELVIS  Technique: Both transabdominal and transvaginal ultrasound  examinations of the pelvis were performed. Transabdominal technique  was performed for global imaging of the pelvis including uterus,  ovaries, adnexal regions, and pelvic cul-de-sac.  It was necessary to proceed with endovaginal exam following the  transabdominal exam to visualize the endometrium and adnexa.  Comparison: None  Findings:  Uterus: Normal in size and appearance, measuring 9.0 x 3.9 x 4.0  cm.  Endometrium: Normal in thickness and appearance, measuring 6 mm.  Right ovary: Normal appearance/no adnexal mass. Measures 3.9 x  2.2 x 2.0 cm.  Left ovary: Measures 6.4 x 7.1 x 4.9 cm, containing an anechoic  cyst measuring 6.8 x 5.8 x 4.9 cm.  Other findings: No free fluid  IMPRESSION:  Simple appearing left ovarian cyst measures up to 6.8 cm. Per the  Society of Radiologists in Ultrasound recommendation, this is  almost certainly benign and a 1-year follow-up ultrasound is  recommended.  Original Report Authenticated By: Waneta Martins, M.D.      Marland Kitchen. Results for orders placed during the hospital encounter of 11/28/11 (from the past 24 hour(s))  URINALYSIS, ROUTINE W REFLEX MICROSCOPIC     Status: Abnormal   Collection Time   11/28/11 11:20 PM      Component Value Range   Color, Urine YELLOW  YELLOW   APPearance CLEAR  CLEAR   Specific Gravity, Urine >1.030 (*) 1.005 - 1.030   pH 5.5  5.0 - 8.0   Glucose, UA NEGATIVE  NEGATIVE mg/dL   Hgb urine dipstick MODERATE (*) NEGATIVE   Bilirubin Urine NEGATIVE  NEGATIVE   Ketones, ur NEGATIVE  NEGATIVE mg/dL   Protein, ur NEGATIVE  NEGATIVE mg/dL   Urobilinogen, UA 0.2  0.0 - 1.0 mg/dL    Nitrite NEGATIVE  NEGATIVE   Leukocytes, UA NEGATIVE  NEGATIVE  URINE MICROSCOPIC-ADD ON     Status: Abnormal   Collection Time   11/28/11 11:20 PM      Component Value Range   Squamous Epithelial / LPF FEW (*) RARE   WBC, UA 0-2  <3 WBC/hpf   RBC / HPF 3-6  <3 RBC/hpf   Bacteria, UA FEW (*) RARE  POCT PREGNANCY, URINE     Status: Normal   Collection Time   11/28/11 11:31 PM      Component Value Range  Preg Test, Ur NEGATIVE  NEGATIVE  CBC     Status: Abnormal   Collection Time   11/29/11 12:15 AM      Component Value Range   WBC 17.7 (*) 4.0 - 10.5 K/uL   RBC 4.76  3.87 - 5.11 MIL/uL   Hemoglobin 13.2  12.0 - 15.0 g/dL   HCT 11.9  14.7 - 82.9 %   MCV 84.5  78.0 - 100.0 fL   MCH 27.7  26.0 - 34.0 pg   MCHC 32.8  30.0 - 36.0 g/dL   RDW 56.2  13.0 - 86.5 %   Platelets 272  150 - 400 K/uL  WET PREP, GENITAL     Status: Abnormal   Collection Time   11/29/11 12:24 AM      Component Value Range   Yeast Wet Prep HPF POC NONE SEEN  NONE SEEN   Trich, Wet Prep NONE SEEN  NONE SEEN   Clue Cells Wet Prep HPF POC FEW (*) NONE SEEN   WBC, Wet Prep HPF POC FEW (*) NONE SEEN    MAU Course  Procedures 1.  U/a, UPT, urine cx 2.  Wet prep & gc/ct 3.  Cbc 4.  Pelvic u/s  Assessment and Plan  1.  Lt ovarian simple cyst 2.  BV 3.  Noncompliant w/ f/u r/e presumptive CHTN & Type II DM 4.  Desires pregnancy 5.  H/o PCOS/infertility 6.  Elevated WBC count  1.  D/c home per c/w AVS after 2 percocet tabs po.  Rx'd Flagyl 1 tab po bid x7d.  Rec'd motrin and tylenol prn pain.   2.  cx's pending and differential added to cbc 3.  Per AVS, f/u at [facility] in 1 month or f/u prn worsening s/s 4.  Disc'd diet at length and importance of exercise and rec'd new POC r/e CHTN, PCOS, and DM before conception.  Rec'd pt picking a PCP to follow in future.  Also disc'd importance of f/u if periods become irregular again and to call if ever longer than 3months in between bleeds.  Jacqulyn Barresi H 11/29/2011,  1:05 AM

## 2011-12-01 LAB — URINE CULTURE: Colony Count: NO GROWTH

## 2012-02-20 ENCOUNTER — Ambulatory Visit (INDEPENDENT_AMBULATORY_CARE_PROVIDER_SITE_OTHER): Payer: Managed Care, Other (non HMO) | Admitting: Obstetrics and Gynecology

## 2012-02-20 ENCOUNTER — Encounter: Payer: Self-pay | Admitting: Obstetrics and Gynecology

## 2012-02-20 VITALS — BP 170/90 | Wt 313.0 lb

## 2012-02-20 DIAGNOSIS — N926 Irregular menstruation, unspecified: Secondary | ICD-10-CM

## 2012-02-20 DIAGNOSIS — N644 Mastodynia: Secondary | ICD-10-CM

## 2012-02-20 DIAGNOSIS — E669 Obesity, unspecified: Secondary | ICD-10-CM

## 2012-02-20 MED ORDER — METFORMIN HCL 500 MG PO TABS: 500.0000 mg | ORAL_TABLET | Freq: Three times a day (TID) | ORAL | Status: DC

## 2012-02-20 NOTE — Progress Notes (Signed)
Subjective:    Robyn [name] is a 31 y.o. female, [phone], who presents for right side breast pain. Described as a tingling sensation. She denies discharge and bleeding from her breast.  She has a history of irregular menstrual cycles.  She has a history of anovulation.  Metformin regulates her cycles and also helps her to lose weight.   The following portions of the patient's history were reviewed and updated as appropriate: allergies, current medications, past family history.  Review of Systems Pertinent items are noted in HPI. Breast:Negative for breast lump,nipple discharge or nipple retraction Gastrointestinal: Negative for abdominal pain, change in bowel habits or rectal bleeding Urinary:negative   Objective:    There were no vitals taken for this visit.    Weight:  Wt Readings from Last 1 Encounters:  11/28/11 314 lb (142.429 kg)          BMI: There is no height or weight on file to calculate BMI.  General Appearance: Alert, appropriate appearance for age. No acute distress Chest: Clear Heart: Regular rate and rhythm Breasts: No masses, skin changes, or other lesions seen   Assessment:    breasts pain  Obesity Irregular cycles   Plan:   The patient will decrease her caffeine intake and also try vitamin E to help her breasts still better. Metformin 500 mg 3 times a day. Continue exercise and weight loss. Prenatal vitamins. Return to office in 3 months or as needed.  Dr. Stefano Gaul

## 2012-05-20 ENCOUNTER — Ambulatory Visit (INDEPENDENT_AMBULATORY_CARE_PROVIDER_SITE_OTHER): Payer: Managed Care, Other (non HMO) | Admitting: Obstetrics and Gynecology

## 2012-05-20 ENCOUNTER — Encounter: Payer: Self-pay | Admitting: Obstetrics and Gynecology

## 2012-05-20 VITALS — BP 130/84 | Wt 317.0 lb

## 2012-05-20 DIAGNOSIS — N644 Mastodynia: Secondary | ICD-10-CM

## 2012-05-20 DIAGNOSIS — E669 Obesity, unspecified: Secondary | ICD-10-CM

## 2012-05-20 DIAGNOSIS — N898 Other specified noninflammatory disorders of vagina: Secondary | ICD-10-CM

## 2012-05-20 DIAGNOSIS — N979 Female infertility, unspecified: Secondary | ICD-10-CM

## 2012-05-20 LAB — POCT WET PREP (WET MOUNT)
Whiff Test: NEGATIVE
pH: 4.5

## 2012-05-20 LAB — POCT URINE PREGNANCY: Preg Test, Ur: NEGATIVE

## 2012-05-20 NOTE — Progress Notes (Signed)
HISTORY OF PRESENT ILLNESS  Robyn [name] is a 31 y.o. year old female,G4P0212, who presents for a problem visit. The patient has a history of breast pain that is now improved.  The patient has a history of infertility.  She has a history of polycystic ovary syndrome.  She has anovulation that has not responded to metformin.  Her last period was in September 2013.  Subjective:  The patient reports that her breast pain is better.  She continues to have a discharge with occasional odor.   Objective:  BP 130/84  Wt 317 lb (143.79 kg)  LMP 02/05/2012   General: no distress GI: soft and nontender  External genitalia: normal general appearance Vaginal: normal without tenderness, induration or masses Cervix: normal appearance Adnexa: normal bimanual exam Uterus: difficult to outline due to obesity  Wet prep: Within normal limits  Pregnancy test: Negative  Assessment:  Improved breast pain  Irregular cycles  Polycystic ovary syndrome  Infertility  Obesity  Vaginal discharge  Plan:  Options for care were reviewed.  I recommended the patient see a reproductive endocrinologist since ovulation was not achieved with metformin nor with Clomid in the past.  The patient agrees.  Return to office in 1 month(s) for annual exam.   Leonard Schwartz M.D.  05/20/2012 9:19 AM

## 2012-06-04 ENCOUNTER — Encounter: Payer: Self-pay | Admitting: Obstetrics and Gynecology

## 2012-06-04 ENCOUNTER — Ambulatory Visit (INDEPENDENT_AMBULATORY_CARE_PROVIDER_SITE_OTHER): Payer: Managed Care, Other (non HMO) | Admitting: Obstetrics and Gynecology

## 2012-06-04 VITALS — BP 120/80 | Resp 20 | Wt 319.0 lb

## 2012-06-04 DIAGNOSIS — Z01419 Encounter for gynecological examination (general) (routine) without abnormal findings: Secondary | ICD-10-CM

## 2012-06-04 DIAGNOSIS — Z124 Encounter for screening for malignant neoplasm of cervix: Secondary | ICD-10-CM

## 2012-06-04 DIAGNOSIS — E282 Polycystic ovarian syndrome: Secondary | ICD-10-CM

## 2012-06-04 MED ORDER — MEDROXYPROGESTERONE ACETATE 10 MG PO TABS: 10.0000 mg | ORAL_TABLET | Freq: Every day | ORAL | Status: DC

## 2012-06-04 NOTE — Progress Notes (Signed)
ANNUAL GYNECOLOGIC EXAMINATION   Robyn [name] is a 32 y.o. female, [phone], who presents for an annual exam. The patient has PCO S.  She has long-standing anovulation.  She is taking metformin.  She has infertility.  She wants to postpone treatment for now.  She is scheduled for gastric surgery.    History   Social History  . Marital Status: Single    Spouse Name: N/A    Number of Children: N/A  . Years of Education: N/A   Social History Main Topics  . Smoking status: Current Every Day Smoker -- 0.5 packs/day for 13 years    Types: Cigarettes  . Smokeless tobacco: Never Used  . Alcohol Use: Yes     Comment: socially and rarely  . Drug Use: No  . Sexually Active: Yes -- Female partner(s)    Birth Control/ Protection: None   Other Topics Concern  . None   Social History Narrative  . None    Menstrual cycle:   LMP: Patient's last menstrual period was 02/05/2012.             The following portions of the patient's history were reviewed and updated as appropriate: allergies, current medications, past family history, past medical history, past social history, past surgical history and problem list.  Review of Systems Pertinent items are noted in HPI. Breast:Negative for breast lump,nipple discharge or nipple retraction Gastrointestinal: Negative for abdominal pain, change in bowel habits or rectal bleeding Urinary:negative   Objective:    BP 120/80  Resp 20  Wt 319 lb (144.697 kg)  LMP 02/05/2012    Weight:  Wt Readings from Last 1 Encounters:  06/04/12 319 lb (144.697 kg)          BMI: There is no height on file to calculate BMI.  General Appearance: Alert, appropriate appearance for age. No acute distress HEENT: Grossly normal except for hair on her face Neck / Thyroid: Supple, no masses, nodes or enlargement Lungs: clear to auscultation bilaterally Back: No CVA tenderness Breast Exam: No masses or nodes.No dimpling, nipple retraction or  discharge. Cardiovascular: Regular rate and rhythm. S1, S2, no murmur Gastrointestinal: Soft, non-tender, no masses or organomegaly  ++++++++++++++++++++++++++++++++++++++++++++++++++++++++  Pelvic Exam: External genitalia: normal general appearance Vaginal: normal without tenderness, induration or masses. Relaxation: yes Cervix: normal appearance Adnexa: normal bimanual exam Uterus: normal size, shape, and consistency Rectovaginal: not indicated  ++++++++++++++++++++++++++++++++++++++++++++++++++++++++  Lymphatic Exam: Non-palpable nodes in neck, clavicular, axillary, or inguinal regions Neurologic: Normal speech, no tremor  Psychiatric: Alert and oriented, appropriate affect.  Assessment:    Normal gyn exam   Overweight or obese: Yes   Pelvic relaxation: Yes  Polycystic ovary syndrome  Amenorrhea  Hirsutism   Plan:    pap smear return annually or prn Contraception:the patient will need contraception.  Depo-Provera is her first choice but there is concern for weight gain associated to water retention.  She will discuss this with her gastric surgeon.   Medications prescribed: Provera 10 mg each day for 10 days.  She will take this every 3 months that she does not have a cycle on her own.  STD screen request: No   The updated Pap smear screening guidelines were discussed with the patient. The patient requested that I obtain a Pap smear: Yes.  Kegel exercises discussed: Yes.  Proper diet and regular exercise were reviewed.  Annual mammograms recommended starting at age 53. Proper breast care was discussed.  Screening colonoscopy is recommended beginning at age 6.  Regular health maintenance was reviewed.  Sleep hygiene was discussed.  Adequate calcium and vitamin D intake was emphasized.  Leonard Schwartz M.D.    Regular Periods: no PCOS Mammogram: no  Monthly Breast Ex.: yes Exercise: no  Tetanus < 10 years: yes Seatbelts: yes  NI. Bladder  Functn.: yes Abuse at home: no  Daily BM's: yes Stressful Work: no  Healthy Diet: yes Sigmoid-Colonoscopy: never  Calcium: no Medical problems this year: Pt requests BC pill or Depo recommended before future Sleeve Gastrectomy.   LAST PAP: 01/10/11 WNL pap thin prep ASCUS rflx   Contraception: none  Mammogram:  never  PCP: Dr. Providence Lanius ([facility])  PMH:  No change  FMH:  No change  Last Bone Scan: never

## 2012-06-05 LAB — PAP IG W/ RFLX HPV ASCU

## 2012-07-29 ENCOUNTER — Telehealth: Payer: Self-pay | Admitting: Obstetrics and Gynecology

## 2012-07-29 NOTE — Telephone Encounter (Signed)
TC call to pt regarding RX request for [facility] pills. Pt last AEX note from 06-04-2012 return annually or prn  Contraception:the patient will need contraception. Depo-Provera is her first choice but there is concern for weight gain associated to water retention. She will discuss this with her gastric surgeon. Pt voicemail is too full to leave a message.   [facility] CMA

## 2012-07-30 ENCOUNTER — Telehealth: Payer: Self-pay

## 2012-07-30 NOTE — Telephone Encounter (Signed)
Tc to pt regarding BC pills pt states she does not want Injections She also consulted w/ her gastric surgeon and they recommend oral contraceptives. Made pt aware AVS is not in the office this month and I will have to await him to F/u through message or consult w/ another provider. Pt voiced understanding  [facility] CMA

## 2012-08-01 ENCOUNTER — Telehealth: Payer: Self-pay

## 2012-08-01 NOTE — Telephone Encounter (Signed)
Pt request for oral contraceptives Per [facility] route message to her and she will advise.  LC

## 2012-08-04 NOTE — Telephone Encounter (Signed)
Pt requesting BCPs for contraception.  Also has PCOS with irregular menses and obesity.  Has a prior hx of cigarette smoking Rec: Robyn [name] 1 po daily, starting first day of next menses. Disp #1 cycle with 2 refills   BUM with condoms for 1 month  Smoking cessation as cigarette smoking increases the risk of clotting complications with BCPs  Follow up with Dr. Stefano Gaul in 3 months to evaluate for continuation of BCPs

## 2012-08-06 ENCOUNTER — Telehealth: Payer: Self-pay

## 2012-08-06 ENCOUNTER — Other Ambulatory Visit: Payer: Self-pay

## 2012-08-06 MED ORDER — LEVONORGESTREL-ETHINYL ESTRAD 0.1-20 MG-MCG PO TABS: 1.0000 | ORAL_TABLET | Freq: Every day | ORAL | Status: DC

## 2012-08-06 NOTE — Telephone Encounter (Signed)
Alesse sent to pt pharm per [facility] Pt called to be made aware Pt not ava. LMOVM for pt to call back  To be made aware of further details from [facility].  [facility] CMA

## 2012-08-07 ENCOUNTER — Telehealth: Payer: Self-pay

## 2012-08-07 NOTE — Telephone Encounter (Signed)
Pt made aware that BC pills has been sent to pharm. And made aware of [facility] response. Pt agreeable. [facility] CMA

## 2013-06-09 ENCOUNTER — Emergency Department (HOSPITAL_BASED_OUTPATIENT_CLINIC_OR_DEPARTMENT_OTHER): Payer: PRIVATE HEALTH INSURANCE

## 2013-06-09 ENCOUNTER — Encounter (HOSPITAL_BASED_OUTPATIENT_CLINIC_OR_DEPARTMENT_OTHER): Payer: Self-pay | Admitting: Emergency Medicine

## 2013-06-09 ENCOUNTER — Emergency Department (HOSPITAL_BASED_OUTPATIENT_CLINIC_OR_DEPARTMENT_OTHER)
Admission: EM | Admit: 2013-06-09 | Discharge: 2013-06-09 | Disposition: A | Discharge status: Home / Self Care | Payer: PRIVATE HEALTH INSURANCE | Attending: Emergency Medicine | Admitting: Emergency Medicine

## 2013-06-09 DIAGNOSIS — Z8632 Personal history of gestational diabetes: Secondary | ICD-10-CM | POA: Insufficient documentation

## 2013-06-09 DIAGNOSIS — Z3202 Encounter for pregnancy test, result negative: Secondary | ICD-10-CM | POA: Insufficient documentation

## 2013-06-09 DIAGNOSIS — I10 Essential (primary) hypertension: Secondary | ICD-10-CM | POA: Insufficient documentation

## 2013-06-09 DIAGNOSIS — R0789 Other chest pain: Secondary | ICD-10-CM | POA: Insufficient documentation

## 2013-06-09 DIAGNOSIS — Z862 Personal history of diseases of the blood and blood-forming organs and certain disorders involving the immune mechanism: Secondary | ICD-10-CM | POA: Insufficient documentation

## 2013-06-09 DIAGNOSIS — Z8742 Personal history of other diseases of the female genital tract: Secondary | ICD-10-CM | POA: Insufficient documentation

## 2013-06-09 DIAGNOSIS — Z8639 Personal history of other endocrine, nutritional and metabolic disease: Secondary | ICD-10-CM | POA: Insufficient documentation

## 2013-06-09 DIAGNOSIS — R0602 Shortness of breath: Secondary | ICD-10-CM | POA: Insufficient documentation

## 2013-06-09 DIAGNOSIS — Z79899 Other long term (current) drug therapy: Secondary | ICD-10-CM | POA: Insufficient documentation

## 2013-06-09 DIAGNOSIS — Z8619 Personal history of other infectious and parasitic diseases: Secondary | ICD-10-CM | POA: Insufficient documentation

## 2013-06-09 DIAGNOSIS — F17200 Nicotine dependence, unspecified, uncomplicated: Secondary | ICD-10-CM | POA: Insufficient documentation

## 2013-06-09 LAB — BASIC METABOLIC PANEL
BUN: 11 mg/dL (ref 6–23)
CO2: 27 mEq/L (ref 19–32)
CREATININE: 0.8 mg/dL (ref 0.50–1.10)
Calcium: 9.6 mg/dL (ref 8.4–10.5)
Chloride: 103 mEq/L (ref 96–112)
Glucose, Bld: 99 mg/dL (ref 70–99)
Potassium: 3.9 mEq/L (ref 3.7–5.3)
Sodium: 143 mEq/L (ref 137–147)

## 2013-06-09 LAB — CBC WITH DIFFERENTIAL/PLATELET
BASOS ABS: 0 10*3/uL (ref 0.0–0.1)
BASOS PCT: 0 % (ref 0–1)
EOS ABS: 0.1 10*3/uL (ref 0.0–0.7)
EOS PCT: 1 % (ref 0–5)
HEMATOCRIT: 41.9 % (ref 36.0–46.0)
Hemoglobin: 13.4 g/dL (ref 12.0–15.0)
Lymphocytes Relative: 27 % (ref 12–46)
Lymphs Abs: 2.6 10*3/uL (ref 0.7–4.0)
MCH: 27.2 pg (ref 26.0–34.0)
MCHC: 32 g/dL (ref 30.0–36.0)
MCV: 85 fL (ref 78.0–100.0)
MONO ABS: 0.7 10*3/uL (ref 0.1–1.0)
Monocytes Relative: 7 % (ref 3–12)
Neutro Abs: 6.3 10*3/uL (ref 1.7–7.7)
Neutrophils Relative %: 66 % (ref 43–77)
PLATELETS: 232 10*3/uL (ref 150–400)
RBC: 4.93 MIL/uL (ref 3.87–5.11)
RDW: 15.3 % (ref 11.5–15.5)
WBC: 9.7 10*3/uL (ref 4.0–10.5)

## 2013-06-09 LAB — PREGNANCY, URINE: Preg Test, Ur: NEGATIVE

## 2013-06-09 LAB — TROPONIN I

## 2013-06-09 LAB — D-DIMER, QUANTITATIVE: D-Dimer, Quant: <0.27 ug/mL-FEU (ref 0.00–0.48)

## 2013-06-09 MED ORDER — ASPIRIN 81 MG PO CHEW
324.0000 mg | CHEWABLE_TABLET | Freq: Once | ORAL | Status: AC
  Administered 2013-06-09: 324 mg via ORAL
  Filled 2013-06-09: qty 4

## 2013-06-09 NOTE — Discharge Instructions (Signed)
Your caregiver has diagnosed you as having chest pain that is not specific for one problem, but does not require admission.  You are at low risk for an acute heart condition or other serious illness. Chest pain comes from many different causes.  °SEEK IMMEDIATE MEDICAL ATTENTION IF: °You have severe chest pain, especially if the pain is crushing or pressure-like and spreads to the arms, back, neck, or jaw, or if you have sweating, nausea (feeling sick to your stomach), or shortness of breath. THIS IS AN EMERGENCY. Don't wait to see if the pain will go away. Get medical help at once. Call 911 or 0 (operator). DO NOT drive yourself to the hospital.  °Your chest pain gets worse and does not go away with rest.  °You have an attack of chest pain lasting longer than usual, despite rest and treatment with the medications your caregiver has prescribed.  °You wake from sleep with chest pain or shortness of breath.  °You feel dizzy or faint.  °You have chest pain not typical of your usual pain for which you originally saw your caregiver. ° °Chest Pain (Nonspecific) °It is often hard to give a specific diagnosis for the cause of chest pain. There is always a chance that your pain could be related to something serious, such as a heart attack or a blood clot in the lungs. You need to follow up with your caregiver for further evaluation. °CAUSES  °· Heartburn. °· Pneumonia or bronchitis. °· Anxiety or stress. °· Inflammation around your heart (pericarditis) or lung (pleuritis or pleurisy). °· A blood clot in the lung. °· A collapsed lung (pneumothorax). It can develop suddenly on its own (spontaneous pneumothorax) or from injury (trauma) to the chest. °· Shingles infection (herpes zoster virus). °The chest wall is composed of bones, muscles, and cartilage. Any of these can be the source of the pain. °· The bones can be bruised by injury. °· The muscles or cartilage can be strained by coughing or overwork. °· The cartilage can be  affected by inflammation and become sore (costochondritis). °DIAGNOSIS  °Lab tests or other studies, such as X-rays, electrocardiography, stress testing, or cardiac imaging, may be needed to find the cause of your pain.  °TREATMENT  °· Treatment depends on what may be causing your chest pain. Treatment may include: °· Acid blockers for heartburn. °· Anti-inflammatory medicine. °· Pain medicine for inflammatory conditions. °· Antibiotics if an infection is present. °· You may be advised to change lifestyle habits. This includes stopping smoking and avoiding alcohol, caffeine, and chocolate. °· You may be advised to keep your head raised (elevated) when sleeping. This reduces the chance of acid going backward from your stomach into your esophagus. °· Most of the time, nonspecific chest pain will improve within 2 to 3 days with rest and mild pain medicine. °HOME CARE INSTRUCTIONS  °· If antibiotics were prescribed, take your antibiotics as directed. Finish them even if you start to feel better. °· For the next few days, avoid physical activities that bring on chest pain. Continue physical activities as directed. °· Do not smoke. °· Avoid drinking alcohol. °· Only take over-the-counter or prescription medicine for pain, discomfort, or fever as directed by your caregiver. °· Follow your caregiver's suggestions for further testing if your chest pain does not go away. °· Keep any follow-up appointments you made. If you do not go to an appointment, you could develop lasting (chronic) problems with pain. If there is any problem keeping an appointment,   you must call to reschedule. °SEEK MEDICAL CARE IF:  °· You think you are having problems from the medicine you are taking. Read your medicine instructions carefully. °· Your chest pain does not go away, even after treatment. °· You develop a rash with blisters on your chest. °SEEK IMMEDIATE MEDICAL CARE IF:  °· You have increased chest pain or pain that spreads to your arm,  neck, jaw, back, or abdomen. °· You develop shortness of breath, an increasing cough, or you are coughing up blood. °· You have severe back or abdominal pain, feel nauseous, or vomit. °· You develop severe weakness, fainting, or chills. °· You have a fever. °THIS IS AN EMERGENCY. Do not wait to see if the pain will go away. Get medical help at once. Call your local emergency services (911 in [place] Do not drive yourself to the hospital. °MAKE SURE YOU:  °· Understand these instructions. °· Will watch your condition. °· Will get help right away if you are not doing well or get worse. °Document Released: 02/22/2005 Document Revised: 08/07/2011 Document Reviewed: 12/19/2007 °[employer]® Patient Information ©2014 [employer]. ° °

## 2013-06-09 NOTE — ED Notes (Signed)
Patient transported to X-ray 

## 2013-06-09 NOTE — ED Notes (Signed)
Pt c/o right sided chest pain and SOB x 3 days

## 2013-06-09 NOTE — ED Notes (Signed)
MD at bedside. 

## 2013-06-09 NOTE — ED Provider Notes (Signed)
CSN: [id number]     Arrival date & time 06/09/13  1730 History  This chart was scribed for Audree Camel, MD by Greggory Stallion, ED Scribe. This patient was seen in room MH09/MH09 and the patient's care was started at 6:00 PM.   Chief Complaint  Patient presents with  . Chest Pain   The history is provided by the patient. No language interpreter was used.   HPI Comments: Robyn [name] is a 33 y.o. female who presents to the Emergency Department complaining of intermittent right sided pressurized chest pain and SOB that started 2 days ago. She states the pain has been constant since yesterday. Rates the pain 5/10. Laying down makes the pain a little better. Certain movements worsen the pain. Denies fever, cough, leg swelling, rash, abdominal pain, nausea. Denies recently travel or illness. Pt states she is not currently on birth control. The last time she took birth control was 10/2012.  Past Medical History  Diagnosis Date  . Gestational diabetes   . Hypertension   . Pregnancy induced hypertension   . Herpes   . Left ovarian cyst 11/29/2011  . Smoker 11/29/2011  . PCOS (polycystic ovarian syndrome)    Past Surgical History  Procedure Laterality Date  . Cesarean section    . Wisdom tooth extraction     Family History  Problem Relation Age of Onset  . Hypertension Mother   . Diabetes Father   . Stroke Father   . Cancer Maternal Uncle   . Cancer Maternal Grandmother    History  Substance Use Topics  . Smoking status: Current Every Day Smoker -- 0.50 packs/day for 13 years    Types: Cigarettes  . Smokeless tobacco: Never Used  . Alcohol Use: Yes     Comment: socially and rarely   OB History   Grav Para Term Preterm Abortions TAB SAB Ect Mult Living   4 2  2 1  1   2      Review of Systems  Constitutional: Negative for fever.  Respiratory: Positive for shortness of breath. Negative for cough.   Cardiovascular: Positive for chest pain. Negative for leg swelling.   Gastrointestinal: Negative for nausea and abdominal pain.  Skin: Negative for rash.  All other systems reviewed and are negative.    Allergies  Review of patient's allergies indicates no known allergies.  Home Medications   Current Outpatient Rx  Name  Route  Sig  Dispense  Refill  . acetaminophen (TYLENOL) 500 MG tablet   Oral   Take 1,000 mg by mouth every 6 (six) hours as needed.         Marland Kitchen levonorgestrel-ethinyl estradiol (AVIANE,ALESSE,LESSINA) 0.1-20 MG-MCG tablet   Oral   Take 1 tablet by mouth daily.   1 Package   2   . medroxyPROGESTERone (PROVERA) 10 MG tablet   Oral   Take 1 tablet (10 mg total) by mouth daily.   30 tablet   3   . metFORMIN (GLUCOPHAGE) 500 MG tablet   Oral   Take 1 tablet (500 mg total) by mouth 3 (three) times daily after meals.   100 tablet   5    BP 191/94  Pulse 95  Temp(Src) 99.3 F (37.4 C) (Oral)  Resp 18  Ht 5\' 7"  (1.702 m)  Wt 314 lb (142.429 kg)  BMI 49.17 kg/m2  SpO2 100%  LMP 05/20/2013  Physical Exam  Nursing note and vitals reviewed. Constitutional: She is oriented to person, place, and time. She  appears well-developed and well-nourished. No distress.  HENT:  Head: Normocephalic and atraumatic.  Eyes: EOM are normal.  Neck: Neck supple. No tracheal deviation present.  Cardiovascular: Normal rate, regular rhythm and normal heart sounds.   No murmur heard. Pulmonary/Chest: Effort normal and breath sounds normal. No respiratory distress. She has no wheezes. She has no rales. She exhibits no tenderness.  Abdominal: Soft. There is no tenderness.  Musculoskeletal: Normal range of motion.  Neurological: She is alert and oriented to person, place, and time.  Skin: Skin is warm and dry.  Psychiatric: She has a normal mood and affect. Her behavior is normal.    ED Course  Procedures (including critical care time)  DIAGNOSTIC STUDIES: Oxygen Saturation is 100% on RA, normal by my interpretation.    COORDINATION  OF CARE: 6:05 PM-Discussed treatment plan which includes blood work with pt at bedside and pt agreed to plan.   Labs Review Labs Reviewed  CBC WITH DIFFERENTIAL  BASIC METABOLIC PANEL  TROPONIN I  D-DIMER, QUANTITATIVE  PREGNANCY, URINE   Imaging Review Dg Chest 2 View  06/09/2013   CLINICAL DATA:  Right-sided chest pain.  EXAM: CHEST  2 VIEW  COMPARISON:  None.  FINDINGS: Cardiopericardial silhouette within normal limits. Mediastinal contours normal. Trachea midline. No airspace disease or effusion.  IMPRESSION: No active cardiopulmonary disease.   Electronically Signed   By: Andreas NewportGeoffrey  Lamke M.D.   On: 06/09/2013 18:56    EKG Interpretation    Date/Time:  Monday June 09 2013 17:44:15 EST Ventricular Rate:  93 PR Interval:  158 QRS Duration: 94 QT Interval:  360 QTC Calculation: 447 R Axis:   42 Text Interpretation:  Normal sinus rhythm Nonspecific T wave abnormality Abnormal ECG No old tracing to compare Confirmed by Kacee Koren  MD, Sheldon Sem ([id number]) on 06/09/2013 7:18:04 PM            MDM   1. Atypical chest pain    Patient is low risk for PE, and has negative Ddimer. Feel this effectively rules out PE. CXR benign. EKG nonspecific with some nonspecific T waves. No signs of MI as she's had over 12 hours of pain with negative troponin. I discussed her case with Dr. Diona BrownerMcDowell of cards, who feels EKG is benign and there is no old to compare to. Recommends PCP f/u for outpatient workup. I estimate she is low risk for ACS and safe to be discharged at this time.   I personally performed the services described in this documentation, which was scribed in my presence. The recorded information has been reviewed and is accurate.   Audree CamelScott T Jasmyn Picha, MD 06/10/13 [phone]

## 2013-08-19 DIAGNOSIS — E1165 Type 2 diabetes mellitus with hyperglycemia: Secondary | ICD-10-CM | POA: Insufficient documentation

## 2014-03-30 ENCOUNTER — Encounter (HOSPITAL_BASED_OUTPATIENT_CLINIC_OR_DEPARTMENT_OTHER): Payer: Self-pay | Admitting: Emergency Medicine

## 2014-08-11 ENCOUNTER — Encounter (HOSPITAL_COMMUNITY): Payer: Self-pay | Admitting: *Deleted

## 2014-08-11 ENCOUNTER — Inpatient Hospital Stay (HOSPITAL_COMMUNITY): Payer: Medicaid Other

## 2014-08-11 ENCOUNTER — Inpatient Hospital Stay (HOSPITAL_COMMUNITY)
Admission: AD | Admit: 2014-08-11 | Discharge: 2014-08-11 | Disposition: A | Discharge status: Home / Self Care | Payer: Medicaid Other | Source: Ambulatory Visit | Attending: Obstetrics & Gynecology | Admitting: Obstetrics & Gynecology

## 2014-08-11 DIAGNOSIS — R51 Headache: Secondary | ICD-10-CM | POA: Insufficient documentation

## 2014-08-11 DIAGNOSIS — Z3201 Encounter for pregnancy test, result positive: Secondary | ICD-10-CM | POA: Insufficient documentation

## 2014-08-11 DIAGNOSIS — R1030 Lower abdominal pain, unspecified: Secondary | ICD-10-CM | POA: Insufficient documentation

## 2014-08-11 DIAGNOSIS — O26899 Other specified pregnancy related conditions, unspecified trimester: Secondary | ICD-10-CM

## 2014-08-11 DIAGNOSIS — O26851 Spotting complicating pregnancy, first trimester: Secondary | ICD-10-CM

## 2014-08-11 DIAGNOSIS — R109 Unspecified abdominal pain: Secondary | ICD-10-CM

## 2014-08-11 HISTORY — DX: Type 2 diabetes mellitus without complications: E11.9

## 2014-08-11 LAB — URINE MICROSCOPIC-ADD ON

## 2014-08-11 LAB — URINALYSIS, ROUTINE W REFLEX MICROSCOPIC
Bilirubin Urine: NEGATIVE
Glucose, UA: NEGATIVE mg/dL
KETONES UR: NEGATIVE mg/dL
LEUKOCYTES UA: NEGATIVE
Nitrite: NEGATIVE
Protein, ur: NEGATIVE mg/dL
Specific Gravity, Urine: 1.015 (ref 1.005–1.030)
Urobilinogen, UA: 0.2 mg/dL (ref 0.0–1.0)
pH: 6 (ref 5.0–8.0)

## 2014-08-11 LAB — OB RESULTS CONSOLE ABO/RH: RH TYPE: POSITIVE

## 2014-08-11 LAB — CBC
HEMATOCRIT: 39 % (ref 36.0–46.0)
HEMOGLOBIN: 12.6 g/dL (ref 12.0–15.0)
MCH: 27.2 pg (ref 26.0–34.0)
MCHC: 32.3 g/dL (ref 30.0–36.0)
MCV: 84.2 fL (ref 78.0–100.0)
Platelets: 244 10*3/uL (ref 150–400)
RBC: 4.63 MIL/uL (ref 3.87–5.11)
RDW: 15.7 % — ABNORMAL HIGH (ref 11.5–15.5)
WBC: 13 10*3/uL — ABNORMAL HIGH (ref 4.0–10.5)

## 2014-08-11 LAB — WET PREP, GENITAL
Clue Cells Wet Prep HPF POC: NONE SEEN
Trich, Wet Prep: NONE SEEN
Yeast Wet Prep HPF POC: NONE SEEN

## 2014-08-11 LAB — OB RESULTS CONSOLE HIV ANTIBODY (ROUTINE TESTING): HIV: NONREACTIVE

## 2014-08-11 LAB — POCT PREGNANCY, URINE: Preg Test, Ur: POSITIVE — AB

## 2014-08-11 LAB — GLUCOSE, CAPILLARY: GLUCOSE-CAPILLARY: 106 mg/dL — AB (ref 70–99)

## 2014-08-11 LAB — HCG, QUANTITATIVE, PREGNANCY: HCG, BETA CHAIN, QUANT, S: 1102 m[IU]/mL — AB (ref <5)

## 2014-08-11 LAB — OB RESULTS CONSOLE GC/CHLAMYDIA: GC PROBE AMP, GENITAL: NEGATIVE

## 2014-08-11 NOTE — MAU Note (Signed)
Pt reports lower abd cramping and spotting for the last week off/on. Also reports headaches off.on. LMP 06/23/2014, had a positive preg test at home.

## 2014-08-11 NOTE — Discharge Instructions (Signed)
Abdominal Pain During Pregnancy Abdominal pain is common in pregnancy. Most of the time, it does not cause harm. There are many causes of abdominal pain. Some causes are more serious than others. Some of the causes of abdominal pain in pregnancy are easily diagnosed. Occasionally, the diagnosis takes time to understand. Other times, the cause is not determined. Abdominal pain can be a sign that something is very wrong with the pregnancy, or the pain may have nothing to do with the pregnancy at all. For this reason, always tell your health care provider if you have any abdominal discomfort. HOME CARE INSTRUCTIONS  Monitor your abdominal pain for any changes. The following actions may help to alleviate any discomfort you are experiencing:  Do not have sexual intercourse or put anything in your vagina until your symptoms go away completely.  Get plenty of rest until your pain improves.  Drink clear fluids if you feel nauseous. Avoid solid food as long as you are uncomfortable or nauseous.  Only take over-the-counter or prescription medicine as directed by your health care provider.  Keep all follow-up appointments with your health care provider. SEEK IMMEDIATE MEDICAL CARE IF:  You are bleeding, leaking fluid, or passing tissue from the vagina.  You have increasing pain or cramping.  You have persistent vomiting.  You have painful or bloody urination.  You have a fever.  You notice a decrease in your baby's movements.  You have extreme weakness or feel faint.  You have shortness of breath, with or without abdominal pain.  You develop a severe headache with abdominal pain.  You have abnormal vaginal discharge with abdominal pain.  You have persistent diarrhea.  You have abdominal pain that continues even after rest, or gets worse. MAKE SURE YOU:   Understand these instructions.  Will watch your condition.  Will get help right away if you are not doing well or get  worse. Document Released: 05/15/2005 Document Revised: 03/05/2013 Document Reviewed: 12/12/2012 [facility] Patient Information 2015 [place], [place]. This information is not intended to replace advice given to you by your health care provider. Make sure you discuss any questions you have with your health care provider.  Abdominal Pain During Pregnancy Abdominal pain is common in pregnancy. Most of the time, it does not cause harm. There are many causes of abdominal pain. Some causes are more serious than others. Some of the causes of abdominal pain in pregnancy are easily diagnosed. Occasionally, the diagnosis takes time to understand. Other times, the cause is not determined. Abdominal pain can be a sign that something is very wrong with the pregnancy, or the pain may have nothing to do with the pregnancy at all. For this reason, always tell your health care provider if you have any abdominal discomfort. HOME CARE INSTRUCTIONS  Monitor your abdominal pain for any changes. The following actions may help to alleviate any discomfort you are experiencing:  Do not have sexual intercourse or put anything in your vagina until your symptoms go away completely.  Get plenty of rest until your pain improves.  Drink clear fluids if you feel nauseous. Avoid solid food as long as you are uncomfortable or nauseous.  Only take over-the-counter or prescription medicine as directed by your health care provider.  Keep all follow-up appointments with your health care provider. SEEK IMMEDIATE MEDICAL CARE IF:  You are bleeding, leaking fluid, or passing tissue from the vagina.  You have increasing pain or cramping.  You have persistent vomiting.  You have painful  or bloody urination.  You have a fever.  You notice a decrease in your baby's movements.  You have extreme weakness or feel faint.  You have shortness of breath, with or without abdominal pain.  You develop a severe headache with abdominal  pain.  You have abnormal vaginal discharge with abdominal pain.  You have persistent diarrhea.  You have abdominal pain that continues even after rest, or gets worse. MAKE SURE YOU:   Understand these instructions.  Will watch your condition.  Will get help right away if you are not doing well or get worse. Document Released: 05/15/2005 Document Revised: 03/05/2013 Document Reviewed: 12/12/2012 [facility] Patient [facility] 2015 [place], [place]. This information is not intended to replace advice given to you by your health care provider. Make sure you discuss any questions you have with your health care provider.  Abdominal Pain During Pregnancy Abdominal pain is common in pregnancy. Most of the time, it does not cause harm. There are many causes of abdominal pain. Some causes are more serious than others. Some of the causes of abdominal pain in pregnancy are easily diagnosed. Occasionally, the diagnosis takes time to understand. Other times, the cause is not determined. Abdominal pain can be a sign that something is very wrong with the pregnancy, or the pain may have nothing to do with the pregnancy at all. For this reason, always tell your health care provider if you have any abdominal discomfort. HOME CARE INSTRUCTIONS  Monitor your abdominal pain for any changes. The following actions may help to alleviate any discomfort you are experiencing:  Do not have sexual intercourse or put anything in your vagina until your symptoms go away completely.  Get plenty of rest until your pain improves.  Drink clear fluids if you feel nauseous. Avoid solid food as long as you are uncomfortable or nauseous.  Only take over-the-counter or prescription medicine as directed by your health care provider.  Keep all follow-up appointments with your health care provider. SEEK IMMEDIATE MEDICAL CARE IF:  You are bleeding, leaking fluid, or passing tissue from the vagina.  You have increasing pain or  cramping.  You have persistent vomiting.  You have painful or bloody urination.  You have a fever.  You notice a decrease in your baby's movements.  You have extreme weakness or feel faint.  You have shortness of breath, with or without abdominal pain.  You develop a severe headache with abdominal pain.  You have abnormal vaginal discharge with abdominal pain.  You have persistent diarrhea.  You have abdominal pain that continues even after rest, or gets worse. MAKE SURE YOU:   Understand these instructions.  Will watch your condition.  Will get help right away if you are not doing well or get worse. Document Released: 05/15/2005 Document Revised: 03/05/2013 Document Reviewed: 12/12/2012 [facility] Patient Information 2015 [place], [place]. This information is not intended to replace advice given to you by your health care provider. Make sure you discuss any questions you have with your health care provider.  Abdominal Pain During Pregnancy Abdominal pain is common in pregnancy. Most of the time, it does not cause harm. There are many causes of abdominal pain. Some causes are more serious than others. Some of the causes of abdominal pain in pregnancy are easily diagnosed. Occasionally, the diagnosis takes time to understand. Other times, the cause is not determined. Abdominal pain can be a sign that something is very wrong with the pregnancy, or the pain may have nothing to do  with the pregnancy at all. For this reason, always tell your health care provider if you have any abdominal discomfort. HOME CARE INSTRUCTIONS  Monitor your abdominal pain for any changes. The following actions may help to alleviate any discomfort you are experiencing:  Do not have sexual intercourse or put anything in your vagina until your symptoms go away completely.  Get plenty of rest until your pain improves.  Drink clear fluids if you feel nauseous. Avoid solid food as long as you are uncomfortable  or nauseous.  Only take over-the-counter or prescription medicine as directed by your health care provider.  Keep all follow-up appointments with your health care provider. SEEK IMMEDIATE MEDICAL CARE IF:  You are bleeding, leaking fluid, or passing tissue from the vagina.  You have increasing pain or cramping.  You have persistent vomiting.  You have painful or bloody urination.  You have a fever.  You notice a decrease in your baby's movements.  You have extreme weakness or feel faint.  You have shortness of breath, with or without abdominal pain.  You develop a severe headache with abdominal pain.  You have abnormal vaginal discharge with abdominal pain.  You have persistent diarrhea.  You have abdominal pain that continues even after rest, or gets worse. MAKE SURE YOU:   Understand these instructions.  Will watch your condition.  Will get help right away if you are not doing well or get worse. Document Released: 05/15/2005 Document Revised: 03/05/2013 Document Reviewed: 12/12/2012 [facility] Patient Information 2015 [place], [place]. This information is not intended to replace advice given to you by your health care provider. Make sure you discuss any questions you have with your health care provider. First Trimester of Pregnancy The first trimester of pregnancy is from week 1 until the end of week 12 (months 1 through 3). A week after a sperm fertilizes an egg, the egg will implant on the wall of the uterus. This embryo will begin to develop into a baby. Genes from you and your partner are forming the baby. The female genes determine whether the baby is a boy or a girl. At 6-8 weeks, the eyes and face are formed, and the heartbeat can be seen on ultrasound. At the end of 12 weeks, all the baby's organs are formed.  Now that you are pregnant, you will want to do everything you can to have a healthy baby. Two of the most important things are to get good prenatal care and to  follow your health care provider's instructions. Prenatal care is all the medical care you receive before the baby's birth. This care will help prevent, find, and treat any problems during the pregnancy and childbirth. BODY CHANGES Your body goes through many changes during pregnancy. The changes vary from woman to woman.   You may gain or lose a couple of pounds at first.  You may feel sick to your stomach (nauseous) and throw up (vomit). If the vomiting is uncontrollable, call your health care provider.  You may tire easily.  You may develop headaches that can be relieved by medicines approved by your health care provider.  You may urinate more often. Painful urination may mean you have a bladder infection.  You may develop heartburn as a result of your pregnancy.  You may develop constipation because certain hormones are causing the muscles that push waste through your intestines to slow down.  You may develop hemorrhoids or swollen, bulging veins (varicose veins).  Your breasts may begin to  grow larger and become tender. Your nipples may stick out more, and the tissue that surrounds them (areola) may become darker.  Your gums may bleed and may be sensitive to brushing and flossing.  Dark spots or blotches (chloasma, mask of pregnancy) may develop on your face. This will likely fade after the baby is born.  Your menstrual periods will stop.  You may have a loss of appetite.  You may develop cravings for certain kinds of food.  You may have changes in your emotions from day to day, such as being excited to be pregnant or being concerned that something may go wrong with the pregnancy and baby.  You may have more vivid and strange dreams.  You may have changes in your hair. These can include thickening of your hair, rapid growth, and changes in texture. Some women also have hair loss during or after pregnancy, or hair that feels dry or thin. Your hair will most likely return to  normal after your baby is born. WHAT TO EXPECT AT YOUR PRENATAL VISITS During a routine prenatal visit:  You will be weighed to make sure you and the baby are growing normally.  Your blood pressure will be taken.  Your abdomen will be measured to track your baby's growth.  The fetal heartbeat will be listened to starting around week 10 or 12 of your pregnancy.  Test results from any previous visits will be discussed. Your health care provider may ask you:  How you are feeling.  If you are feeling the baby move.  If you have had any abnormal symptoms, such as leaking fluid, bleeding, severe headaches, or abdominal cramping.  If you have any questions. Other tests that may be performed during your first trimester include:  Blood tests to find your blood type and to check for the presence of any previous infections. They will also be used to check for low iron levels (anemia) and Rh antibodies. Later in the pregnancy, blood tests for diabetes will be done along with other tests if problems develop.  Urine tests to check for infections, diabetes, or protein in the urine.  An ultrasound to confirm the proper growth and development of the baby.  An amniocentesis to check for possible genetic problems.  Fetal screens for spina bifida and Down syndrome.  You may need other tests to make sure you and the baby are doing well. HOME CARE INSTRUCTIONS  Medicines  Follow your health care provider's instructions regarding medicine use. Specific medicines may be either safe or unsafe to take during pregnancy.  Take your prenatal vitamins as directed.  If you develop constipation, try taking a stool softener if your health care provider approves. Diet  Eat regular, well-balanced meals. Choose a variety of foods, such as meat or vegetable-based protein, fish, milk and low-fat dairy products, vegetables, fruits, and whole grain breads and cereals. Your health care provider will help you  determine the amount of weight gain that is right for you.  Avoid raw meat and uncooked cheese. These carry germs that can cause birth defects in the baby.  Eating four or five small meals rather than three large meals a day may help relieve nausea and vomiting. If you start to feel nauseous, eating a few soda crackers can be helpful. Drinking liquids between meals instead of during meals also seems to help nausea and vomiting.  If you develop constipation, eat more high-fiber foods, such as fresh vegetables or fruit and whole grains. Drink enough fluids  to keep your urine clear or pale yellow. Activity and Exercise  Exercise only as directed by your health care provider. Exercising will help you:  Control your weight.  Stay in shape.  Be prepared for labor and delivery.  Experiencing pain or cramping in the lower abdomen or low back is a good sign that you should stop exercising. Check with your health care provider before continuing normal exercises.  Try to avoid standing for long periods of time. Move your legs often if you must stand in one place for a long time.  Avoid heavy lifting.  Wear low-heeled shoes, and practice good posture.  You may continue to have sex unless your health care provider directs you otherwise. Relief of Pain or Discomfort  Wear a good support bra for breast tenderness.   Take warm sitz baths to soothe any pain or discomfort caused by hemorrhoids. Use hemorrhoid cream if your health care provider approves.   Rest with your legs elevated if you have leg cramps or low back pain.  If you develop varicose veins in your legs, wear support hose. Elevate your feet for 15 minutes, 3-4 times a day. Limit salt in your diet. Prenatal Care  Schedule your prenatal visits by the twelfth week of pregnancy. They are usually scheduled monthly at first, then more often in the last 2 months before delivery.  Write down your questions. Take them to your prenatal  visits.  Keep all your prenatal visits as directed by your health care provider. Safety  Wear your seat belt at all times when driving.  Make a list of emergency phone numbers, including numbers for family, friends, the hospital, and police and fire departments. General Tips  Ask your health care provider for a referral to a local prenatal education class. Begin classes no later than at the beginning of month 6 of your pregnancy.  Ask for help if you have counseling or nutritional needs during pregnancy. Your health care provider can offer advice or refer you to specialists for help with various needs.  Do not use hot tubs, steam rooms, or saunas.  Do not douche or use tampons or scented sanitary pads.  Do not cross your legs for long periods of time.  Avoid cat litter boxes and soil used by cats. These carry germs that can cause birth defects in the baby and possibly loss of the fetus by miscarriage or stillbirth.  Avoid all smoking, herbs, alcohol, and medicines not prescribed by your health care provider. Chemicals in these affect the formation and growth of the baby.  Schedule a dentist appointment. At home, brush your teeth with a soft toothbrush and be gentle when you floss. SEEK MEDICAL CARE IF:   You have dizziness.  You have mild pelvic cramps, pelvic pressure, or nagging pain in the abdominal area.  You have persistent nausea, vomiting, or diarrhea.  You have a bad smelling vaginal discharge.  You have pain with urination.  You notice increased swelling in your face, hands, legs, or ankles. SEEK IMMEDIATE MEDICAL CARE IF:   You have a fever.  You are leaking fluid from your vagina.  You have spotting or bleeding from your vagina.  You have severe abdominal cramping or pain.  You have rapid weight gain or loss.  You vomit blood or material that looks like coffee grounds.  You are exposed to [place] measles and have never had them.  You are exposed to  fifth disease or chickenpox.  You develop a severe  headache.  You have shortness of breath.  You have any kind of trauma, such as from a fall or a car accident. Document Released: 05/09/2001 Document Revised: 09/29/2013 Document Reviewed: 03/25/2013 [facility] Patient Information 2015 [place], [place]. This information is not intended to replace advice given to you by your health care provider. Make sure you discuss any questions you have with your health care provider. Abdominal Pain During Pregnancy Abdominal pain is common in pregnancy. Most of the time, it does not cause harm. There are many causes of abdominal pain. Some causes are more serious than others. Some of the causes of abdominal pain in pregnancy are easily diagnosed. Occasionally, the diagnosis takes time to understand. Other times, the cause is not determined. Abdominal pain can be a sign that something is very wrong with the pregnancy, or the pain may have nothing to do with the pregnancy at all. For this reason, always tell your health care provider if you have any abdominal discomfort. HOME CARE INSTRUCTIONS  Monitor your abdominal pain for any changes. The following actions may help to alleviate any discomfort you are experiencing:  Do not have sexual intercourse or put anything in your vagina until your symptoms go away completely.  Get plenty of rest until your pain improves.  Drink clear fluids if you feel nauseous. Avoid solid food as long as you are uncomfortable or nauseous.  Only take over-the-counter or prescription medicine as directed by your health care provider.  Keep all follow-up appointments with your health care provider. SEEK IMMEDIATE MEDICAL CARE IF:  You are bleeding, leaking fluid, or passing tissue from the vagina.  You have increasing pain or cramping.  You have persistent vomiting.  You have painful or bloody urination.  You have a fever.  You notice a decrease in your baby's  movements.  You have extreme weakness or feel faint.  You have shortness of breath, with or without abdominal pain.  You develop a severe headache with abdominal pain.  You have abnormal vaginal discharge with abdominal pain.  You have persistent diarrhea.  You have abdominal pain that continues even after rest, or gets worse. MAKE SURE YOU:   Understand these instructions.  Will watch your condition.  Will get help right away if you are not doing well or get worse. Document Released: 05/15/2005 Document Revised: 03/05/2013 Document Reviewed: 12/12/2012 [facility] Patient Information 2015 [place], [place]. This information is not intended to replace advice given to you by your health care provider. Make sure you discuss any questions you have with your health care provider. Ectopic Pregnancy An ectopic pregnancy is when the fertilized egg attaches (implants) outside the uterus. Most ectopic pregnancies occur in the fallopian tube. Rarely do ectopic pregnancies occur on the ovary, intestine, pelvis, or cervix. In an ectopic pregnancy, the fertilized egg does not have the ability to develop into a normal, healthy baby.  A ruptured ectopic pregnancy is one in which the fallopian tube gets torn or bursts and results in internal bleeding. Often there is intense abdominal pain, and sometimes, vaginal bleeding. Having an ectopic pregnancy can be life threatening. If left untreated, this dangerous condition can lead to a blood transfusion, abdominal surgery, or even death. CAUSES  Damage to the fallopian tubes is the suspected cause in most ectopic pregnancies.  RISK FACTORS Depending on your circumstances, the risk of having an ectopic pregnancy will vary. The level of risk can be divided into three categories. High Risk  You have gone through infertility treatment.  You have had a previous ectopic pregnancy.  You have had previous tubal surgery.  You have had previous surgery to have the  fallopian tubes tied (tubal ligation).  You have tubal problems or diseases.  You have been exposed to DES. DES is a medicine that was used until 1971 and had effects on babies whose mothers took the medicine.  You become pregnant while using an intrauterine device (IUD) for birth control. Moderate Risk  You have a history of infertility.  You have a history of a sexually transmitted infection (STI).  You have a history of pelvic inflammatory disease (PID).  You have scarring from endometriosis.  You have multiple sexual partners.  You smoke. Low Risk  You have had previous pelvic surgery.  You use vaginal douching.  You became sexually active before 35 years of age. SIGNS AND SYMPTOMS  An ectopic pregnancy should be suspected in anyone who has missed a period and has abdominal pain or bleeding.  You may experience normal pregnancy symptoms, such as:  Nausea.  Tiredness.  Breast tenderness.  Other symptoms may include:  Pain with intercourse.  Irregular vaginal bleeding or spotting.  Cramping or pain on one side or in the lower abdomen.  Fast heartbeat.  Passing out while having a bowel movement.  Symptoms of a ruptured ectopic pregnancy and internal bleeding may include:  Sudden, severe pain in the abdomen and pelvis.  Dizziness or fainting.  Pain in the shoulder area. DIAGNOSIS  Tests that may be performed include:  A pregnancy test.  An ultrasound test.  Testing the specific level of pregnancy hormone in the bloodstream.  Taking a sample of uterus tissue (dilation and curettage, D&C).  Surgery to perform a visual exam of the inside of the abdomen using a thin, lighted tube with a tiny camera on the end (laparoscope). TREATMENT  An injection of a medicine called methotrexate may be given. This medicine causes the pregnancy tissue to be absorbed. It is given if:  The diagnosis is made early.  The fallopian tube has not ruptured.  You are  considered to be a good candidate for the medicine. Usually, pregnancy hormone blood levels are checked after methotrexate treatment. This is to be sure the medicine is effective. It may take 4-6 weeks for the pregnancy to be absorbed (though most pregnancies will be absorbed by 3 weeks). Surgical treatment may be needed. A laparoscope may be used to remove the pregnancy tissue. If severe internal bleeding occurs, a cut (incision) may be made in the lower abdomen (laparotomy), and the ectopic pregnancy is removed. This stops the bleeding. Part of the fallopian tube, or the whole tube, may be removed as well (salpingectomy). After surgery, pregnancy hormone tests may be done to be sure there is no pregnancy tissue left. You may receive a Rho (D) immune globulin shot if you are Rh negative and the father is Rh positive, or if you do not know the Rh type of the father. This is to prevent problems with any future pregnancy. SEEK IMMEDIATE MEDICAL CARE IF:  You have any symptoms of an ectopic pregnancy. This is a medical emergency. MAKE SURE YOU:  Understand these instructions.  Will watch your condition.  Will get help right away if you are not doing well or get worse. Document Released: 06/22/2004 Document Revised: 09/29/2013 Document Reviewed: 12/12/2012 [facility] Patient Information 2015 [place], [place]. This information is not intended to replace advice given to you by your health care provider. Make sure you  discuss any questions you have with your health care provider. ° °

## 2014-08-11 NOTE — MAU Note (Signed)
Pt states she kept feeling like she was going  To pass out.Pt states she has been having cramping and a little bit of spotting

## 2014-08-12 LAB — GC/CHLAMYDIA PROBE AMP (~~LOC~~) NOT AT ARMC
Chlamydia: NEGATIVE
NEISSERIA GONORRHEA: NEGATIVE

## 2014-08-12 LAB — ABO/RH: ABO/RH(D): O POS

## 2014-08-12 LAB — HIV ANTIBODY (ROUTINE TESTING W REFLEX): HIV Screen 4th Generation wRfx: NONREACTIVE

## 2014-08-13 ENCOUNTER — Inpatient Hospital Stay (HOSPITAL_COMMUNITY)
Admission: AD | Admit: 2014-08-13 | Discharge: 2014-08-13 | Disposition: A | Discharge status: Home / Self Care | Payer: Medicaid Other | Source: Ambulatory Visit | Attending: Family Medicine | Admitting: Family Medicine

## 2014-08-13 DIAGNOSIS — O10011 Pre-existing essential hypertension complicating pregnancy, first trimester: Secondary | ICD-10-CM | POA: Diagnosis not present

## 2014-08-13 DIAGNOSIS — R109 Unspecified abdominal pain: Secondary | ICD-10-CM | POA: Diagnosis not present

## 2014-08-13 DIAGNOSIS — Z3A01 Less than 8 weeks gestation of pregnancy: Secondary | ICD-10-CM | POA: Diagnosis not present

## 2014-08-13 DIAGNOSIS — O9989 Other specified diseases and conditions complicating pregnancy, childbirth and the puerperium: Secondary | ICD-10-CM | POA: Insufficient documentation

## 2014-08-13 DIAGNOSIS — O10911 Unspecified pre-existing hypertension complicating pregnancy, first trimester: Secondary | ICD-10-CM

## 2014-08-13 DIAGNOSIS — O26899 Other specified pregnancy related conditions, unspecified trimester: Secondary | ICD-10-CM

## 2014-08-13 LAB — HCG, QUANTITATIVE, PREGNANCY: hCG, Beta Chain, Quant, S: 3568 m[IU]/mL — ABNORMAL HIGH (ref <5)

## 2014-08-13 MED ORDER — CONCEPT OB 130-92.4-1 MG PO CAPS: 1.0000 | ORAL_CAPSULE | Freq: Every day | ORAL | Status: DC

## 2014-08-13 MED ORDER — LABETALOL HCL 200 MG PO TABS: 200.0000 mg | ORAL_TABLET | Freq: Two times a day (BID) | ORAL | Status: DC

## 2014-08-13 NOTE — MAU Provider Note (Signed)
S: 34 y.o. [id number] @[redacted]w[redacted]d  by LMP presents to MAU for repeat hcg.  She denies abdominal pain or vaginal bleeding today.    Her quant hcg on 3/15 was 1102 and ultrasound showed no GS or YS and soft tissue abnormality near right ovary.  O: BP 157/80 mmHg  Pulse 89  Temp(Src) 98.6 F (37 C) (Oral)  Resp 18  Ht 5\' 7"  (1.702 m)  Wt 136.986 kg (302 lb)  BMI 47.29 kg/m2  LMP 06/23/2014  Patient Vitals for the past 24 hrs:  BP Temp Temp src Pulse Resp Height Weight  08/13/14 1858 157/80 mmHg - - 89 - 5\' 7"  (1.702 m) (!) 136.986 kg (302 lb)  08/13/14 1856 166/91 mmHg 98.6 F (37 C) Oral 91 18 - -    Physical Examination: General appearance - alert, well appearing, and in no distress, oriented to person, place, and time and acyanotic, in no respiratory distress  Results for orders placed or performed during the hospital encounter of 08/13/14 (from the past 24 hour(s))  hCG, quantitative, pregnancy     Status: Abnormal   Collection Time: 08/13/14  7:13 PM  Result Value Ref Range   hCG, Beta Chain, Quant, S 3568 (H) <5 mIU/mL    A:  1. Abdominal pain affecting pregnancy   2. 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, first trimester     P: D/C home with ectopic/bleeding precautions F/U with outpatient ultrasound as ordered Prenatal vitamins D/C Lisinopril (pt has not taken in 1 month) Labetalol 200 mg BID Return to MAU as needed for emergencies  LEFTWICH-KIRBY, Zaedyn Covin, CNM 2:18 PM

## 2014-08-13 NOTE — Discharge Instructions (Signed)
First Trimester of Pregnancy The first trimester of pregnancy is from week 1 until the end of week 12 (months 1 through 3). A week after a sperm fertilizes an egg, the egg will implant on the wall of the uterus. This embryo will begin to develop into a baby. Genes from you and your partner are forming the baby. The female genes determine whether the baby is a boy or a girl. At 6-8 weeks, the eyes and face are formed, and the heartbeat can be seen on ultrasound. At the end of 12 weeks, all the baby's organs are formed.  Now that you are pregnant, you will want to do everything you can to have a healthy baby. Two of the most important things are to get good prenatal care and to follow your health care provider's instructions. Prenatal care is all the medical care you receive before the baby's birth. This care will help prevent, find, and treat any problems during the pregnancy and childbirth. BODY CHANGES Your body goes through many changes during pregnancy. The changes vary from woman to woman.   You may gain or lose a couple of pounds at first.  You may feel sick to your stomach (nauseous) and throw up (vomit). If the vomiting is uncontrollable, call your health care provider.  You may tire easily.  You may develop headaches that can be relieved by medicines approved by your health care provider.  You may urinate more often. Painful urination may mean you have a bladder infection.  You may develop heartburn as a result of your pregnancy.  You may develop constipation because certain hormones are causing the muscles that push waste through your intestines to slow down.  You may develop hemorrhoids or swollen, bulging veins (varicose veins).  Your breasts may begin to grow larger and become tender. Your nipples may stick out more, and the tissue that surrounds them (areola) may become darker.  Your gums may bleed and may be sensitive to brushing and flossing.  Dark spots or blotches (chloasma,  mask of pregnancy) may develop on your face. This will likely fade after the baby is born.  Your menstrual periods will stop.  You may have a loss of appetite.  You may develop cravings for certain kinds of food.  You may have changes in your emotions from day to day, such as being excited to be pregnant or being concerned that something may go wrong with the pregnancy and baby.  You may have more vivid and strange dreams.  You may have changes in your hair. These can include thickening of your hair, rapid growth, and changes in texture. Some women also have hair loss during or after pregnancy, or hair that feels dry or thin. Your hair will most likely return to normal after your baby is born. WHAT TO EXPECT AT YOUR PRENATAL VISITS During a routine prenatal visit:  You will be weighed to make sure you and the baby are growing normally.  Your blood pressure will be taken.  Your abdomen will be measured to track your baby's growth.  The fetal heartbeat will be listened to starting around week 10 or 12 of your pregnancy.  Test results from any previous visits will be discussed. Your health care provider may ask you:  How you are feeling.  If you are feeling the baby move.  If you have had any abnormal symptoms, such as leaking fluid, bleeding, severe headaches, or abdominal cramping.  If you have any questions. Other tests   that may be performed during your first trimester include:  Blood tests to find your blood type and to check for the presence of any previous infections. They will also be used to check for low iron levels (anemia) and Rh antibodies. Later in the pregnancy, blood tests for diabetes will be done along with other tests if problems develop.  Urine tests to check for infections, diabetes, or protein in the urine.  An ultrasound to confirm the proper growth and development of the baby.  An amniocentesis to check for possible genetic problems.  Fetal screens for  spina bifida and Down syndrome.  You may need other tests to make sure you and the baby are doing well. HOME CARE INSTRUCTIONS  Medicines  Follow your health care provider's instructions regarding medicine use. Specific medicines may be either safe or unsafe to take during pregnancy.  Take your prenatal vitamins as directed.  If you develop constipation, try taking a stool softener if your health care provider approves. Diet  Eat regular, well-balanced meals. Choose a variety of foods, such as meat or vegetable-based protein, fish, milk and low-fat dairy products, vegetables, fruits, and whole grain breads and cereals. Your health care provider will help you determine the amount of weight gain that is right for you.  Avoid raw meat and uncooked cheese. These carry germs that can cause birth defects in the baby.  Eating four or five small meals rather than three large meals a day may help relieve nausea and vomiting. If you start to feel nauseous, eating a few soda crackers can be helpful. Drinking liquids between meals instead of during meals also seems to help nausea and vomiting.  If you develop constipation, eat more high-fiber foods, such as fresh vegetables or fruit and whole grains. Drink enough fluids to keep your urine clear or pale yellow. Activity and Exercise  Exercise only as directed by your health care provider. Exercising will help you:  Control your weight.  Stay in shape.  Be prepared for labor and delivery.  Experiencing pain or cramping in the lower abdomen or low back is a good sign that you should stop exercising. Check with your health care provider before continuing normal exercises.  Try to avoid standing for long periods of time. Move your legs often if you must stand in one place for a long time.  Avoid heavy lifting.  Wear low-heeled shoes, and practice good posture.  You may continue to have sex unless your health care provider directs you  otherwise. Relief of Pain or Discomfort  Wear a good support bra for breast tenderness.   Take warm sitz baths to soothe any pain or discomfort caused by hemorrhoids. Use hemorrhoid cream if your health care provider approves.   Rest with your legs elevated if you have leg cramps or low back pain.  If you develop varicose veins in your legs, wear support hose. Elevate your feet for 15 minutes, 3-4 times a day. Limit salt in your diet. Prenatal Care  Schedule your prenatal visits by the twelfth week of pregnancy. They are usually scheduled monthly at first, then more often in the last 2 months before delivery.  Write down your questions. Take them to your prenatal visits.  Keep all your prenatal visits as directed by your health care provider. Safety  Wear your seat belt at all times when driving.  Make a list of emergency phone numbers, including numbers for family, friends, the hospital, and police and fire departments. General Tips    Ask your health care provider for a referral to a local prenatal education class. Begin classes no later than at the beginning of month 6 of your pregnancy.  Ask for help if you have counseling or nutritional needs during pregnancy. Your health care provider can offer advice or refer you to specialists for help with various needs.  Do not use hot tubs, steam rooms, or saunas.  Do not douche or use tampons or scented sanitary pads.  Do not cross your legs for long periods of time.  Avoid cat litter boxes and soil used by cats. These carry germs that can cause birth defects in the baby and possibly loss of the fetus by miscarriage or stillbirth.  Avoid all smoking, herbs, alcohol, and medicines not prescribed by your health care provider. Chemicals in these affect the formation and growth of the baby.  Schedule a dentist appointment. At home, brush your teeth with a soft toothbrush and be gentle when you floss. SEEK MEDICAL CARE IF:   You have  dizziness.  You have mild pelvic cramps, pelvic pressure, or nagging pain in the abdominal area.  You have persistent nausea, vomiting, or diarrhea.  You have a bad smelling vaginal discharge.  You have pain with urination.  You notice increased swelling in your face, hands, legs, or ankles. SEEK IMMEDIATE MEDICAL CARE IF:   You have a fever.  You are leaking fluid from your vagina.  You have spotting or bleeding from your vagina.  You have severe abdominal cramping or pain.  You have rapid weight gain or loss.  You vomit blood or material that looks like coffee grounds.  You are exposed to German measles and have never had them.  You are exposed to fifth disease or chickenpox.  You develop a severe headache.  You have shortness of breath.  You have any kind of trauma, such as from a fall or a car accident. Document Released: 05/09/2001 Document Revised: 09/29/2013 Document Reviewed: 03/25/2013 [employer] Patient Information 2015 [employer]. This information is not intended to replace advice given to you by your health care provider. Make sure you discuss any questions you have with your health care provider.  

## 2014-08-13 NOTE — MAU Note (Signed)
Here for repeat blood work. No pain or bleeding.

## 2014-08-16 ENCOUNTER — Inpatient Hospital Stay (HOSPITAL_COMMUNITY): Payer: Medicaid Other

## 2014-08-16 ENCOUNTER — Inpatient Hospital Stay (HOSPITAL_COMMUNITY)
Admission: AD | Admit: 2014-08-16 | Discharge: 2014-08-16 | Disposition: A | Discharge status: Home / Self Care | Payer: Medicaid Other | Source: Ambulatory Visit | Attending: Obstetrics & Gynecology | Admitting: Obstetrics & Gynecology

## 2014-08-16 ENCOUNTER — Encounter (HOSPITAL_COMMUNITY): Payer: Self-pay

## 2014-08-16 DIAGNOSIS — Z3A01 Less than 8 weeks gestation of pregnancy: Secondary | ICD-10-CM | POA: Insufficient documentation

## 2014-08-16 DIAGNOSIS — R109 Unspecified abdominal pain: Secondary | ICD-10-CM | POA: Diagnosis present

## 2014-08-16 DIAGNOSIS — O4690 Antepartum hemorrhage, unspecified, unspecified trimester: Secondary | ICD-10-CM | POA: Diagnosis present

## 2014-08-16 DIAGNOSIS — O3421 Maternal care for scar from previous cesarean delivery: Secondary | ICD-10-CM | POA: Insufficient documentation

## 2014-08-16 DIAGNOSIS — Z87891 Personal history of nicotine dependence: Secondary | ICD-10-CM | POA: Diagnosis not present

## 2014-08-16 DIAGNOSIS — O99211 Obesity complicating pregnancy, first trimester: Secondary | ICD-10-CM | POA: Insufficient documentation

## 2014-08-16 DIAGNOSIS — O3431 Maternal care for cervical incompetence, first trimester: Secondary | ICD-10-CM | POA: Insufficient documentation

## 2014-08-16 DIAGNOSIS — O10011 Pre-existing essential hypertension complicating pregnancy, first trimester: Secondary | ICD-10-CM | POA: Insufficient documentation

## 2014-08-16 DIAGNOSIS — O209 Hemorrhage in early pregnancy, unspecified: Secondary | ICD-10-CM

## 2014-08-16 LAB — URINALYSIS, ROUTINE W REFLEX MICROSCOPIC
Bilirubin Urine: NEGATIVE
Glucose, UA: NEGATIVE mg/dL
KETONES UR: NEGATIVE mg/dL
LEUKOCYTES UA: NEGATIVE
Nitrite: NEGATIVE
PH: 5 (ref 5.0–8.0)
Protein, ur: 30 mg/dL — AB
Urobilinogen, UA: 0.2 mg/dL (ref 0.0–1.0)

## 2014-08-16 LAB — URINE MICROSCOPIC-ADD ON

## 2014-08-16 LAB — HCG, QUANTITATIVE, PREGNANCY: HCG, BETA CHAIN, QUANT, S: 7500 m[IU]/mL — AB (ref <5)

## 2014-08-16 LAB — CBC WITH DIFFERENTIAL/PLATELET
Basophils Absolute: 0 10*3/uL (ref 0.0–0.1)
Basophils Relative: 0 % (ref 0–1)
Eosinophils Absolute: 0.1 10*3/uL (ref 0.0–0.7)
Eosinophils Relative: 0 % (ref 0–5)
HCT: 36.5 % (ref 36.0–46.0)
Hemoglobin: 11.9 g/dL — ABNORMAL LOW (ref 12.0–15.0)
LYMPHS PCT: 23 % (ref 12–46)
Lymphs Abs: 2.9 10*3/uL (ref 0.7–4.0)
MCH: 27.4 pg (ref 26.0–34.0)
MCHC: 32.6 g/dL (ref 30.0–36.0)
MCV: 84.1 fL (ref 78.0–100.0)
Monocytes Absolute: 0.9 10*3/uL (ref 0.1–1.0)
Monocytes Relative: 7 % (ref 3–12)
NEUTROS ABS: 8.9 10*3/uL — AB (ref 1.7–7.7)
Neutrophils Relative %: 70 % (ref 43–77)
Platelets: 210 10*3/uL (ref 150–400)
RBC: 4.34 MIL/uL (ref 3.87–5.11)
RDW: 15.5 % (ref 11.5–15.5)
WBC: 12.7 10*3/uL — AB (ref 4.0–10.5)

## 2014-08-16 MED ORDER — ACETAMINOPHEN 325 MG PO TABS
650.0000 mg | ORAL_TABLET | Freq: Once | ORAL | Status: AC
  Administered 2014-08-16: 650 mg via ORAL
  Filled 2014-08-16: qty 2

## 2014-08-16 NOTE — Discharge Instructions (Signed)
Vaginal Bleeding During Pregnancy, First Trimester  A small amount of bleeding (spotting) from the vagina is relatively common in early pregnancy. It usually stops on its own. Various things may cause bleeding or spotting in early pregnancy. Some bleeding may be related to the pregnancy, and some may not. In most cases, the bleeding is normal and is not a problem. However, bleeding can also be a sign of something serious. Be sure to tell your health care provider about any vaginal bleeding right away.  Some possible causes of vaginal bleeding during the first trimester include:  · Infection or inflammation of the cervix.  · Growths (polyps) on the cervix.  · Miscarriage or threatened miscarriage.  · Pregnancy tissue has developed outside of the uterus and in a fallopian tube (tubal pregnancy).  · Tiny cysts have developed in the uterus instead of pregnancy tissue (molar pregnancy).  HOME CARE INSTRUCTIONS   Watch your condition for any changes. The following actions may help to lessen any discomfort you are feeling:  · Follow your health care provider's instructions for limiting your activity. If your health care provider orders bed rest, you may need to stay in bed and only get up to use the bathroom. However, your health care provider may allow you to continue light activity.  · If needed, make plans for someone to help with your regular activities and responsibilities while you are on bed rest.  · Keep track of the number of pads you use each day, how often you change pads, and how soaked (saturated) they are. Write this down.  · Do not use tampons. Do not douche.  · Do not have sexual intercourse or orgasms until approved by your health care provider.  · If you pass any tissue from your vagina, save the tissue so you can show it to your health care provider.  · Only take over-the-counter or prescription medicines as directed by your health care provider.  · Do not take aspirin because it can make you  bleed.  · Keep all follow-up appointments as directed by your health care provider.  SEEK MEDICAL CARE IF:  · You have any vaginal bleeding during any part of your pregnancy.  · You have cramps or labor pains.  · You have a fever, not controlled by medicine.  SEEK IMMEDIATE MEDICAL CARE IF:   · You have severe cramps in your back or belly (abdomen).  · You pass large clots or tissue from your vagina.  · Your bleeding increases.  · You feel light-headed or weak, or you have fainting episodes.  · You have chills.  · You are leaking fluid or have a gush of fluid from your vagina.  · You pass out while having a bowel movement.  MAKE SURE YOU:  · Understand these instructions.  · Will watch your condition.  · Will get help right away if you are not doing well or get worse.  Document Released: 02/22/2005 Document Revised: 05/20/2013 Document Reviewed: 01/20/2013  Exit[employer]® Patient Information ©2015 [employer]. This information is not intended to replace advice given to you by your health care provider. Make sure you discuss any questions you have with your health care provider.

## 2014-08-16 NOTE — MAU Note (Signed)
Yesterday I was cramping some. This morning woke up with worse cramping on bottom L side of stomach and pink spotting. Have hx PCOS.

## 2014-08-16 NOTE — MAU Provider Note (Signed)
Robyn [name] is a 34 y.o. G5P2 at 7.5 weeks presents to MAU unannounced c/o cramping and VB   History     Patient Active Problem List   Diagnosis Date Noted  . Obesity 05/20/2012  . Smoker 11/29/2011    Chief Complaint  Patient presents with  . Vaginal Bleeding  . Abdominal Cramping   HPI  OB History    Gravida Para Term Preterm AB TAB SAB Ectopic Multiple Living   5 2  2 1  1   2       Past Medical History  Diagnosis Date  . Hypertension   . Pregnancy induced hypertension   . Herpes   . Left ovarian cyst 11/29/2011  . Smoker 11/29/2011  . PCOS (polycystic ovarian syndrome)   . Diabetes mellitus without complication     Past Surgical History  Procedure Laterality Date  . Cesarean section    . Wisdom tooth extraction      Family History  Problem Relation Age of Onset  . Hypertension Mother   . Diabetes Father   . Stroke Father   . Cancer Maternal Uncle   . Cancer Maternal Grandmother     History  Substance Use Topics  . Smoking status: Former Smoker -- 0.50 packs/day for 13 years    Types: Cigarettes    Quit date: 08/14/2014  . Smokeless tobacco: Never Used  . Alcohol Use: Yes     Comment: socially and rarely    Allergies: No Known Allergies  Prescriptions prior to admission  Medication Sig Dispense Refill Last Dose  . labetalol (NORMODYNE) 200 MG tablet Take 1 tablet (200 mg total) by mouth 2 (two) times daily. 60 tablet 2 08/15/2014 at Unknown time  . Prenat w/o A Vit-FeFum-FePo-FA (CONCEPT OB) 130-92.4-1 MG CAPS Take 1 capsule by mouth daily. 30 capsule 5 08/15/2014 at Unknown time    ROS See HPI above, all other systems are negative  Physical Exam   Blood pressure 140/81, pulse 87, temperature 98.9 F (37.2 C), resp. rate 20, height 5\' 7"  (1.702 m), weight 304 lb 9.6 oz (138.166 kg), last menstrual period 06/23/2014.  Physical Exam Ext:  WNL ABD: Soft, non tender to palpation, no rebound or guarding SVE: cervix closed, bright red blood in  the vault   ED Course  Assessment: IUP at  7.5 weeks Membranes: intact    Plan:  US [employer]   Venus Standard, CNM, MSN 08/16/2014. 5:32 AM  Addendum: Returned from US--minimal bleeding now, cramping subsiding.  Filed Vitals:   08/16/14 0514 08/16/14 0625  BP: 140/81 142/53  Pulse: 87 77  Temp: 98.9 F (37.2 C)   Resp: 20 16  Height: 5\' 7"  (1.702 m)   Weight: 304 lb 9.6 oz (138.166 kg)     Results for orders placed or performed during the hospital encounter of 08/16/14 (from the past 24 hour(s))  Urinalysis, Routine w reflex microscopic     Status: Abnormal   Collection Time: 08/16/14  5:20 AM  Result Value Ref Range   Color, Urine YELLOW YELLOW   APPearance TURBID (A) CLEAR   Specific Gravity, Urine >1.030 (H) 1.005 - 1.030   pH 5.0 5.0 - 8.0   Glucose, UA NEGATIVE NEGATIVE mg/dL   Hgb urine dipstick LARGE (A) NEGATIVE   Bilirubin Urine NEGATIVE NEGATIVE   Ketones, ur NEGATIVE NEGATIVE mg/dL   Protein, ur 30 (A) NEGATIVE mg/dL   Urobilinogen, UA 0.2 0.0 - 1.0 mg/dL   Nitrite NEGATIVE NEGATIVE   Leukocytes,  UA NEGATIVE NEGATIVE  Urine microscopic-add on     Status: Abnormal   Collection Time: 08/16/14  5:20 AM  Result Value Ref Range   Squamous Epithelial / LPF FEW (A) RARE   WBC, UA 0-2 <3 WBC/hpf   RBC / HPF TOO NUMEROUS TO COUNT <3 RBC/hpf   Bacteria, UA FEW (A) RARE  hCG, quantitative, pregnancy     Status: Abnormal   Collection Time: 08/16/14  6:25 AM  Result Value Ref Range   hCG, Beta Chain, Quant, S 7500 (H) <5 mIU/mL  CBC with Differential     Status: Abnormal   Collection Time: 08/16/14  6:25 AM  Result Value Ref Range   WBC 12.7 (H) 4.0 - 10.5 K/uL   RBC 4.34 3.87 - 5.11 MIL/uL   Hemoglobin 11.9 (L) 12.0 - 15.0 g/dL   HCT 16.1 09.6 - 04.5 %   MCV 84.1 78.0 - 100.0 fL   MCH 27.4 26.0 - 34.0 pg   MCHC 32.6 30.0 - 36.0 g/dL   RDW 40.9 81.1 - 91.4 %   Platelets 210 150 - 400 K/uL   Neutrophils Relative % 70 43 - 77 %   Neutro Abs 8.9 (H) 1.7  - 7.7 K/uL   Lymphocytes Relative 23 12 - 46 %   Lymphs Abs 2.9 0.7 - 4.0 K/uL   Monocytes Relative 7 3 - 12 %   Monocytes Absolute 0.9 0.1 - 1.0 K/uL   Eosinophils Relative 0 0 - 5 %   Eosinophils Absolute 0.1 0.0 - 0.7 K/uL   Basophils Relative 0 0 - 1 %   Basophils Absolute 0.0 0.0 - 0.1 K/uL    [place] results: FINDINGS: Intrauterine gestational sac: Small probable gestational sac is now evident in the upper uterine segment.  Yolk sac: No  Embryo: No  Cardiac Activity: Not applicable  MSD: 7 mm 5 w 2 d  Maternal uterus/adnexae: Area soft tissue seen abutting the right ovary on the prior study appears more consistent with an area of normal ovarian tissue on the current exam. There is no convincing extra ovarian mass. Right ovary measures 4.8 cm x 3.1 cm by 2.3 cm. Left ovary not visualized. No left adnexal mass. No subchorionic hemorrhage or uterine abnormality.  IMPRESSION: 1. Small cystic structure in the upper uterine segment is consistent with an early intrauterine gestational sac. This supports the normal evolution of an early intrauterine pregnancy although at this time no yolk sac or embryo seen. 2. Soft tissue abnormality seen abutting the right ovary on the prior study now appears more consistent with an area of normal ovarian tissue. No convincing ectopic pregnancy.  3. Probable early intrauterine gestational sac, but no yolk sac, fetal pole, or cardiac activity yet visualized. Recommend follow-up quantitative B-HCG levels and follow-up US in 14 days to confirm and assess viability. This recommendation follows SRU consensus guidelines: Diagnostic Criteria for Nonviable Pregnancy Early in the First Trimester. Malva Limes Med 2013; 782:9562-13.   Electronically Signed  By: Amie Portland M.D.  On: 08/16/2014 07:18  Consulted with Dr. Su Hilt.  Impression: 1st trimester bleeding Chronic hypertension--currently on Labetalol 200 mg po BID,  started 3/17. Previously on Lisinopril, had not taken in several weeks. Type 2 DM--no meds Morbid obesity Hx + ANA titer Previous C/S x 2, with hx vertical incision with 1st surgery Hx incompetent cervix Hx infertility Hx HSV PCOS, with irregular cycles O+  Plan to repeat US in 1 week Bleeding precautions reviewed.  Discussed findings of  early IUGS, no evidence ectopic, but no ability to predict outcome of pregnancy. Office will contact patient tomorrow to schedule f/u US on 08/24/14.  Nigel Bridgeman, CNM 08/16/14 8:45a

## 2014-08-17 LAB — URINE CULTURE

## 2014-08-20 ENCOUNTER — Ambulatory Visit (HOSPITAL_COMMUNITY)
Admission: RE | Admit: 2014-08-20 | Discharge: 2014-08-20 | Disposition: A | Discharge status: Home / Self Care | Payer: Medicaid Other | Source: Ambulatory Visit | Attending: Advanced Practice Midwife | Admitting: Advanced Practice Midwife

## 2014-08-20 ENCOUNTER — Inpatient Hospital Stay (HOSPITAL_COMMUNITY)
Admission: AD | Admit: 2014-08-20 | Discharge: 2014-08-20 | Disposition: A | Discharge status: Home / Self Care | Payer: Medicaid Other | Source: Ambulatory Visit | Attending: Obstetrics and Gynecology | Admitting: Obstetrics and Gynecology

## 2014-08-20 DIAGNOSIS — E669 Obesity, unspecified: Secondary | ICD-10-CM | POA: Diagnosis not present

## 2014-08-20 DIAGNOSIS — B009 Herpesviral infection, unspecified: Secondary | ICD-10-CM | POA: Diagnosis not present

## 2014-08-20 DIAGNOSIS — Z36 Encounter for antenatal screening of mother: Secondary | ICD-10-CM | POA: Insufficient documentation

## 2014-08-20 DIAGNOSIS — O99211 Obesity complicating pregnancy, first trimester: Secondary | ICD-10-CM | POA: Insufficient documentation

## 2014-08-20 DIAGNOSIS — R768 Other specified abnormal immunological findings in serum: Secondary | ICD-10-CM

## 2014-08-20 DIAGNOSIS — Z3A01 Less than 8 weeks gestation of pregnancy: Secondary | ICD-10-CM | POA: Diagnosis not present

## 2014-08-20 DIAGNOSIS — Z8751 Personal history of pre-term labor: Secondary | ICD-10-CM | POA: Diagnosis not present

## 2014-08-20 DIAGNOSIS — E282 Polycystic ovarian syndrome: Secondary | ICD-10-CM | POA: Diagnosis not present

## 2014-08-20 DIAGNOSIS — R109 Unspecified abdominal pain: Secondary | ICD-10-CM

## 2014-08-20 DIAGNOSIS — O9989 Other specified diseases and conditions complicating pregnancy, childbirth and the puerperium: Secondary | ICD-10-CM | POA: Diagnosis not present

## 2014-08-20 DIAGNOSIS — O161 Unspecified maternal hypertension, first trimester: Secondary | ICD-10-CM | POA: Insufficient documentation

## 2014-08-20 DIAGNOSIS — O26851 Spotting complicating pregnancy, first trimester: Secondary | ICD-10-CM | POA: Insufficient documentation

## 2014-08-20 DIAGNOSIS — O09299 Supervision of pregnancy with other poor reproductive or obstetric history, unspecified trimester: Secondary | ICD-10-CM

## 2014-08-20 DIAGNOSIS — I10 Essential (primary) hypertension: Secondary | ICD-10-CM | POA: Diagnosis present

## 2014-08-20 DIAGNOSIS — IMO0001 Reserved for inherently not codable concepts without codable children: Secondary | ICD-10-CM

## 2014-08-20 DIAGNOSIS — O24911 Unspecified diabetes mellitus in pregnancy, first trimester: Secondary | ICD-10-CM | POA: Diagnosis not present

## 2014-08-20 DIAGNOSIS — E1165 Type 2 diabetes mellitus with hyperglycemia: Secondary | ICD-10-CM

## 2014-08-20 DIAGNOSIS — O34219 Maternal care for unspecified type scar from previous cesarean delivery: Secondary | ICD-10-CM

## 2014-08-20 DIAGNOSIS — O26899 Other specified pregnancy related conditions, unspecified trimester: Secondary | ICD-10-CM

## 2014-08-20 LAB — US OB FOLLOW UP

## 2014-08-20 LAB — HCG, QUANTITATIVE, PREGNANCY: hCG, Beta Chain, Quant, S: 21322 m[IU]/mL — ABNORMAL HIGH (ref <5)

## 2014-08-21 ENCOUNTER — Encounter (HOSPITAL_COMMUNITY): Payer: Self-pay | Admitting: Obstetrics and Gynecology

## 2014-08-21 DIAGNOSIS — B009 Herpesviral infection, unspecified: Secondary | ICD-10-CM | POA: Diagnosis not present

## 2014-08-21 DIAGNOSIS — R768 Other specified abnormal immunological findings in serum: Secondary | ICD-10-CM

## 2014-08-21 DIAGNOSIS — E282 Polycystic ovarian syndrome: Secondary | ICD-10-CM | POA: Diagnosis not present

## 2014-08-21 DIAGNOSIS — O34219 Maternal care for unspecified type scar from previous cesarean delivery: Secondary | ICD-10-CM

## 2014-08-21 DIAGNOSIS — D6862 Lupus anticoagulant syndrome: Secondary | ICD-10-CM | POA: Insufficient documentation

## 2014-08-21 NOTE — MAU Note (Signed)
Patient here for f/u US for continuing assessment of viability. 34 yo [id number] with occasional spotting.   Patient Active Problem List   Diagnosis Date Noted  . PCOS (polycystic ovarian syndrome) 08/21/2014  . Hypertension 08/21/2014  . Herpes 08/21/2014  . Diabetes mellitus without complication 08/21/2014  . ANA positive 08/21/2014  . Hx of incompetent cervix, currently pregnant 08/21/2014  . Previous cesarean delivery, antepartum condition or complication x 2--previous vertical incision 08/21/2014  . History of preterm delivery x 2 08/21/2014  . Vaginal bleeding in pregnancy 08/16/2014  . Obesity 05/20/2012  . Smoker 11/29/2011   Previous evaluations/results: MAU 08/12/14:  Positive UPT, QHCG 1102, CBC WNL, O+, HIV NR, CBG 106, wet prep negative, GC/chlamydia negative.  US showed no GS or YS, and "soft tissue abnormality near right ovary.  Seen by [facility].  MAU 08/13/14:  Repeat QHCG 3568.  Scheduled for US 3/24. Instructed to D/C lisinopril, Rx'd Labetalol 200 mg po BID. BP 157/80, 166/91  MAU 08/16/14--presented with increased bleeding.  Seen by [facility] (previous patient, had scheduled NOB w/u).  QHCG 7500, CBC WNL, UA + for high SG, 30 protein, blood.  Culture f/u >100K mixed species.  BP 140/81.  Pelvic showed bright red blood in vault. US results: IMPRESSION: 1. Small cystic structure in the upper uterine segment is consistent with an early intrauterine gestational sac. This supports the normal evolution of an early intrauterine pregnancy although at this time no yolk sac or embryo seen. 2. Soft tissue abnormality seen abutting the right ovary on the prior study now appears more consistent with an area of normal ovarian tissue. No convincing ectopic pregnancy.  3. Probable early intrauterine gestational sac, but no yolk sac, fetal pole, or cardiac activity yet visualized. Recommend follow-up quantitative B-HCG levels and follow-up US in 14 days to confirm and assess  viability. This recommendation follows SRU consensus guidelines: Diagnostic Criteria for Nonviable Pregnancy Early in the First Trimester. Malva Limes Engl J Med 2013; 454:0981-19; 369:1443-51.  Plan made for repeat US in 1 week at [facility]--scheduled 08/31/14.  [facility] previously scheduled by [facility] staff. Reports small amount spotting, no pain.  US today: FINDINGS: Intrauterine gestational sac: Visualized/normal in shape.  Yolk sac: Present  Embryo: Not visualized  MSD: 15.8 mm 6 w 3 d  Maternal uterus/adnexae: No subchronic hemorrhage.  Bilateral ovaries are within normal limits.  No free fluid.  IMPRESSION: Single intrauterine gestational sac with yolk sac. No fetal pole is visualized.  Consider follow-up pelvic ultrasound in 14 days as clinically Warranted.  [id number]  Reviewed findings with patient. [facility] Has NOB w/u scheduled same week as US. Precautions reviewed. Will f/u with [facility] with any increase in bleeding or pain.  Nigel BridgemanVicki Arelie Kuzel, CNM 08/20/14 6p

## 2014-08-26 ENCOUNTER — Inpatient Hospital Stay (HOSPITAL_COMMUNITY)
Admission: AD | Admit: 2014-08-26 | Discharge: 2014-08-26 | Disposition: A | Discharge status: Home / Self Care | Payer: Medicaid Other | Source: Ambulatory Visit | Attending: Obstetrics & Gynecology | Admitting: Obstetrics & Gynecology

## 2014-08-26 ENCOUNTER — Encounter (HOSPITAL_COMMUNITY): Payer: Self-pay | Admitting: *Deleted

## 2014-08-26 ENCOUNTER — Inpatient Hospital Stay (HOSPITAL_COMMUNITY): Payer: Medicaid Other

## 2014-08-26 DIAGNOSIS — E669 Obesity, unspecified: Secondary | ICD-10-CM | POA: Insufficient documentation

## 2014-08-26 DIAGNOSIS — O24311 Unspecified pre-existing diabetes mellitus in pregnancy, first trimester: Secondary | ICD-10-CM | POA: Diagnosis not present

## 2014-08-26 DIAGNOSIS — O99331 Smoking (tobacco) complicating pregnancy, first trimester: Secondary | ICD-10-CM | POA: Insufficient documentation

## 2014-08-26 DIAGNOSIS — E119 Type 2 diabetes mellitus without complications: Secondary | ICD-10-CM | POA: Diagnosis not present

## 2014-08-26 DIAGNOSIS — O26851 Spotting complicating pregnancy, first trimester: Secondary | ICD-10-CM | POA: Diagnosis not present

## 2014-08-26 DIAGNOSIS — O9989 Other specified diseases and conditions complicating pregnancy, childbirth and the puerperium: Secondary | ICD-10-CM | POA: Insufficient documentation

## 2014-08-26 DIAGNOSIS — R1013 Epigastric pain: Secondary | ICD-10-CM | POA: Diagnosis present

## 2014-08-26 DIAGNOSIS — O99211 Obesity complicating pregnancy, first trimester: Secondary | ICD-10-CM | POA: Diagnosis not present

## 2014-08-26 DIAGNOSIS — F17210 Nicotine dependence, cigarettes, uncomplicated: Secondary | ICD-10-CM | POA: Diagnosis not present

## 2014-08-26 DIAGNOSIS — N939 Abnormal uterine and vaginal bleeding, unspecified: Secondary | ICD-10-CM

## 2014-08-26 DIAGNOSIS — Z3A01 Less than 8 weeks gestation of pregnancy: Secondary | ICD-10-CM | POA: Insufficient documentation

## 2014-08-26 LAB — URINALYSIS, ROUTINE W REFLEX MICROSCOPIC
BILIRUBIN URINE: NEGATIVE
Glucose, UA: NEGATIVE mg/dL
KETONES UR: NEGATIVE mg/dL
Leukocytes, UA: NEGATIVE
NITRITE: NEGATIVE
PH: 6 (ref 5.0–8.0)
PROTEIN: NEGATIVE mg/dL
SPECIFIC GRAVITY, URINE: 1.025 (ref 1.005–1.030)
UROBILINOGEN UA: 1 mg/dL (ref 0.0–1.0)

## 2014-08-26 LAB — COMPREHENSIVE METABOLIC PANEL
ALT: 13 U/L (ref 0–35)
AST: 13 U/L (ref 0–37)
Albumin: 3.7 g/dL (ref 3.5–5.2)
Alkaline Phosphatase: 53 U/L (ref 39–117)
Anion gap: 6 (ref 5–15)
BUN: 11 mg/dL (ref 6–23)
CO2: 25 mmol/L (ref 19–32)
Calcium: 9.2 mg/dL (ref 8.4–10.5)
Chloride: 106 mmol/L (ref 96–112)
Creatinine, Ser: 0.57 mg/dL (ref 0.50–1.10)
GFR calc Af Amer: >90 mL/min (ref >90)
GLUCOSE: 97 mg/dL (ref 70–99)
Potassium: 3.4 mmol/L — ABNORMAL LOW (ref 3.5–5.1)
SODIUM: 137 mmol/L (ref 135–145)
Total Bilirubin: 0.2 mg/dL — ABNORMAL LOW (ref 0.3–1.2)
Total Protein: 7.4 g/dL (ref 6.0–8.3)

## 2014-08-26 LAB — CBC
HCT: 34.9 % — ABNORMAL LOW (ref 36.0–46.0)
Hemoglobin: 11.2 g/dL — ABNORMAL LOW (ref 12.0–15.0)
MCH: 27.2 pg (ref 26.0–34.0)
MCHC: 32.1 g/dL (ref 30.0–36.0)
MCV: 84.7 fL (ref 78.0–100.0)
PLATELETS: 242 10*3/uL (ref 150–400)
RBC: 4.12 MIL/uL (ref 3.87–5.11)
RDW: 16 % — AB (ref 11.5–15.5)
WBC: 14.1 10*3/uL — AB (ref 4.0–10.5)

## 2014-08-26 LAB — AMYLASE: Amylase: 46 U/L (ref 0–105)

## 2014-08-26 LAB — LIPASE, BLOOD: Lipase: 25 U/L (ref 11–59)

## 2014-08-26 LAB — URINE MICROSCOPIC-ADD ON

## 2014-08-26 MED ORDER — PANTOPRAZOLE SODIUM 20 MG PO TBEC
20.0000 mg | DELAYED_RELEASE_TABLET | Freq: Every day | ORAL | Status: DC
  Administered 2014-08-26: 20 mg via ORAL
  Filled 2014-08-26 (×2): qty 1

## 2014-08-26 MED ORDER — OXYCODONE-ACETAMINOPHEN 5-325 MG PO TABS: 2.0000 | ORAL_TABLET | Freq: Once | ORAL | Status: DC

## 2014-08-26 MED ORDER — ACETAMINOPHEN 500 MG PO TABS
1000.0000 mg | ORAL_TABLET | Freq: Once | ORAL | Status: AC
  Administered 2014-08-26: 1000 mg via ORAL
  Filled 2014-08-26: qty 2

## 2014-08-26 MED ORDER — MORPHINE SULFATE 4 MG/ML IJ SOLN
4.0000 mg | Freq: Once | INTRAMUSCULAR | Status: AC
  Administered 2014-08-26: 4 mg via INTRAMUSCULAR
  Filled 2014-08-26: qty 1

## 2014-08-26 NOTE — MAU Note (Signed)
For the last 2 or 3 days, every time she  Eats, her stomach hurts and cramps at the top. Doesn't want to keep taking tylenol if something is wrong.

## 2014-08-26 NOTE — Discharge Instructions (Signed)

## 2014-08-26 NOTE — MAU Provider Note (Signed)
MAU Addendum Note  Results for orders placed or performed during the hospital encounter of 08/26/14 (from the past 24 hour(s))  Urinalysis, Routine w reflex microscopic     Status: Abnormal   Collection Time: 08/26/14 12:30 PM  Result Value Ref Range   Color, Urine YELLOW YELLOW   APPearance CLEAR CLEAR   Specific Gravity, Urine 1.025 1.005 - 1.030   pH 6.0 5.0 - 8.0   Glucose, UA NEGATIVE NEGATIVE mg/dL   Hgb urine dipstick LARGE (A) NEGATIVE   Bilirubin Urine NEGATIVE NEGATIVE   Ketones, ur NEGATIVE NEGATIVE mg/dL   Protein, ur NEGATIVE NEGATIVE mg/dL   Urobilinogen, UA 1.0 0.0 - 1.0 mg/dL   Nitrite NEGATIVE NEGATIVE   Leukocytes, UA NEGATIVE NEGATIVE  Urine microscopic-add on     Status: Abnormal   Collection Time: 08/26/14 12:30 PM  Result Value Ref Range   Squamous Epithelial / LPF FEW (A) RARE   RBC / HPF 21-50 <3 RBC/hpf  CBC     Status: Abnormal   Collection Time: 08/26/14  5:00 PM  Result Value Ref Range   WBC 14.1 (H) 4.0 - 10.5 K/uL   RBC 4.12 3.87 - 5.11 MIL/uL   Hemoglobin 11.2 (L) 12.0 - 15.0 g/dL   HCT 10.9 (L) 60.4 - 54.0 %   MCV 84.7 78.0 - 100.0 fL   MCH 27.2 26.0 - 34.0 pg   MCHC 32.1 30.0 - 36.0 g/dL   RDW 98.1 (H) 19.1 - 47.8 %   Platelets 242 150 - 400 K/uL  Comprehensive metabolic panel     Status: Abnormal   Collection Time: 08/26/14  5:00 PM  Result Value Ref Range   Sodium 137 135 - 145 mmol/L   Potassium 3.4 (L) 3.5 - 5.1 mmol/L   Chloride 106 96 - 112 mmol/L   CO2 25 19 - 32 mmol/L   Glucose, Bld 97 70 - 99 mg/dL   BUN 11 6 - 23 mg/dL   Creatinine, Ser 2.95 0.50 - 1.10 mg/dL   Calcium 9.2 8.4 - 62.1 mg/dL   Total Protein 7.4 6.0 - 8.3 g/dL   Albumin 3.7 3.5 - 5.2 g/dL   AST 13 0 - 37 U/L   ALT 13 0 - 35 U/L   Alkaline Phosphatase 53 39 - 117 U/L   Total Bilirubin 0.2 (L) 0.3 - 1.2 mg/dL   GFR calc non Af Amer >90 >90 mL/min   GFR calc Af Amer >90 >90 mL/min   Anion gap 6 5 - 15  Amylase     Status: None   Collection Time: 08/26/14   5:00 PM  Result Value Ref Range   Amylase 46 0 - 105 U/L  Lipase, blood     Status: None   Collection Time: 08/26/14  5:00 PM  Result Value Ref Range   Lipase 25 11 - 59 U/L   US FINDINGS: Gallbladder: No gallstones, wall thickening or pericholecystic fluid.  Common bile duct: Diameter: 4.8 mm. No evidence of choledocholithiasis.  Liver: No focal lesion identified. Within normal limits in parenchymal echogenicity. Examination is mildly limited by body habitus.  IMPRESSION: Negative right upper quadrant abdominal ultrasound. No evidence of gallbladder or biliary disease.    Plan: -encouraged to eat small meals -do not lay down for 60 minutes after eating -limit chocolate and dairy products -Discussed need to follow up in office on August 31, 2014 -Bleeding and cramping precautions -tylenol ok but limit to  q6hr PRN -Encouraged to call if any  questions or concerns arise prior to next scheduled office visit.  -Discharged to home in stable condition   Savannaha Stonerock, CNM, MSN 08/26/2014. 8:01 PM

## 2014-08-26 NOTE — MAU Note (Signed)
Pt agrees to stay for abd U/S around 1830.  Aware of NPO status.

## 2014-08-26 NOTE — Progress Notes (Signed)
Dr. Sallye OberKulwa informed that pt has had no relief from her meds, orders received.

## 2014-08-26 NOTE — Progress Notes (Signed)
Dr. Sallye OberKulwa informed that pt wants to leave, says she has appt on Monday & that she just wants to be seen then.

## 2014-08-26 NOTE — MAU Provider Note (Signed)
History    34 year old G5 para 6102 22 with early pregnancy (about [redacted] weeks pregnant) who presented complaining of epigastric pain after eating that had been present for the past 2 days. She also complained of vaginal spotting. She had presented to the MAU several days ago with complaints of cramping and spotting and had had follow-up quant hCGs which had been rising appropriately, last HCG on 08/16/14 was 7500 and the last ultrasound on 3/20 showed an intrauterine gestational sac consistent with 5 weeks 2 days EGA but no fetal pole or yolk sac. Patient has an appointment on 08/31/14 for another ultrasound at the office. She reports that her current pain usually improves with Tylenol use.  Pain has achy sensation. 6/10 intensity now.   Patient Active Problem List   Diagnosis Date Noted  . Abdominal pain, epigastric 08/26/2014  . Vaginal spotting 08/26/2014  . PCOS (polycystic ovarian syndrome) 08/21/2014  . Hypertension 08/21/2014  . Herpes 08/21/2014  . Diabetes mellitus without complication 08/21/2014  . ANA positive 08/21/2014  . Hx of incompetent cervix, currently pregnant 08/21/2014  . Previous cesarean delivery, antepartum condition or complication x 2--previous vertical incision 08/21/2014  . History of preterm delivery x 2 08/21/2014  . Vaginal bleeding in pregnancy 08/16/2014  . Obesity 05/20/2012  . Smoker 11/29/2011    Chief Complaint  Patient presents with  . Abdominal Pain   HPI  OB History    Gravida Para Term Preterm AB TAB SAB Ectopic Multiple Living   5 2  2 2 1 1   2       Past Medical History  Diagnosis Date  . Hypertension   . Pregnancy induced hypertension   . Herpes   . Left ovarian cyst 11/29/2011  . Smoker 11/29/2011  . PCOS (polycystic ovarian syndrome)   . Diabetes mellitus without complication     Past Surgical History  Procedure Laterality Date  . Wisdom tooth extraction    . Cesarean section      C/S x 2    Family History  Problem Relation Age of  Onset  . Hypertension Mother   . Diabetes Father   . Stroke Father   . Cancer Maternal Uncle   . Cancer Maternal Grandmother     History  Substance Use Topics  . Smoking status: Current Every Day Smoker -- 0.25 packs/day for 13 years    Types: Cigarettes    Last Attempt to Quit: 08/14/2014  . Smokeless tobacco: Never Used  . Alcohol Use: No     Comment: socially and rarely    Allergies: No Known Allergies  Prescriptions prior to admission  Medication Sig Dispense Refill Last Dose  . calcium carbonate (TUMS - DOSED IN MG ELEMENTAL CALCIUM) 500 MG chewable tablet Chew 2 tablets by mouth daily as needed for indigestion or heartburn.   08/15/2014 at Unknown time  . labetalol (NORMODYNE) 200 MG tablet Take 1 tablet (200 mg total) by mouth 2 (two) times daily. 60 tablet 2 08/15/2014 at Unknown time  . Prenat w/o A Vit-FeFum-FePo-FA (CONCEPT OB) 130-92.4-1 MG CAPS Take 1 capsule by mouth daily. 30 capsule 5 08/15/2014 at Unknown time    ROS Physical Exam   Blood pressure 154/69, pulse 91, temperature 98.9 F (37.2 C), temperature source Oral, resp. rate 18, weight 307 lb (139.254 kg), last menstrual period 06/23/2014.    Physical Exam  Gen.: Moderate distress complaining of pain Abdomen: Soft, obese, tender to palpation in epigastric region, no rebound, no guarding. No tenderness  on other parts of the abdomen. Bimanual exam: Cervix closed and long. Small dark red blood on glove when removed.  Blood group: O pos.   ED Course  Tylenol and Protonix were ordered for suspected gastroesophageal reflux disease.  Patient did not have any improvement in her symptoms. Therefore further workup was obtained including CBC CMP and also upper abdominal ultrasounds were ordered. She also received intramuscular morphine for pain management.   Assessment: Upper abdominal pain likely gastroesophageal reflux disease need to rule out other causes, also with first trimester vaginal  spotting   Plan: Has upcoming office appointment on 08/31/2014 for a follow-up ultrasound and provider visits. Pain and bleeding precautions reviewed  I discussed gastroesophageal reflux disease prevention and management  Follow-up on pending labs and also abdominal ultrasound. This case was signed out to CNM Standard who will follow on the results.   Konrad Felix MD.  08/26/2014 2:56 PM

## 2014-10-01 ENCOUNTER — Other Ambulatory Visit: Payer: Self-pay | Admitting: Obstetrics and Gynecology

## 2014-10-02 ENCOUNTER — Encounter (HOSPITAL_COMMUNITY): Payer: Self-pay

## 2014-10-02 ENCOUNTER — Encounter (HOSPITAL_COMMUNITY)
Admission: RE | Admit: 2014-10-02 | Discharge: 2014-10-02 | Disposition: A | Discharge status: Home / Self Care | Payer: BLUE CROSS/BLUE SHIELD | Source: Ambulatory Visit | Attending: Obstetrics and Gynecology | Admitting: Obstetrics and Gynecology

## 2014-10-02 DIAGNOSIS — Z01818 Encounter for other preprocedural examination: Secondary | ICD-10-CM | POA: Insufficient documentation

## 2014-10-02 DIAGNOSIS — N883 Incompetence of cervix uteri: Secondary | ICD-10-CM | POA: Insufficient documentation

## 2014-10-02 HISTORY — DX: Reserved for inherently not codable concepts without codable children: IMO0001

## 2014-10-02 LAB — BASIC METABOLIC PANEL
ANION GAP: 7 (ref 5–15)
BUN: 7 mg/dL (ref 6–20)
CO2: 24 mmol/L (ref 22–32)
CREATININE: 0.58 mg/dL (ref 0.44–1.00)
Calcium: 8.9 mg/dL (ref 8.9–10.3)
Chloride: 105 mmol/L (ref 101–111)
GFR calc Af Amer: >60 mL/min (ref >60)
GFR calc non Af Amer: >60 mL/min (ref >60)
GLUCOSE: 194 mg/dL — AB (ref 70–99)
POTASSIUM: 3.9 mmol/L (ref 3.5–5.1)
Sodium: 136 mmol/L (ref 135–145)

## 2014-10-02 LAB — CBC
HCT: 36.3 % (ref 36.0–46.0)
Hemoglobin: 11.9 g/dL — ABNORMAL LOW (ref 12.0–15.0)
MCH: 27.4 pg (ref 26.0–34.0)
MCHC: 32.8 g/dL (ref 30.0–36.0)
MCV: 83.6 fL (ref 78.0–100.0)
PLATELETS: 233 10*3/uL (ref 150–400)
RBC: 4.34 MIL/uL (ref 3.87–5.11)
RDW: 15.1 % (ref 11.5–15.5)
WBC: 11.3 10*3/uL — ABNORMAL HIGH (ref 4.0–10.5)

## 2014-10-02 NOTE — Patient Instructions (Addendum)
Your procedure is scheduled on: Oct 09, 2014  [facility] at:11:30  Pick up the phone at the desk and dial [phone].  Call this number if you have problems the morning of surgery: [phone].  Remember: Do NOT eat food: after midnight on Thursday  Do NOT drink clear liquids after: 0900 day of surgery Take these medicines the morning of surgery with a SIP OF WATER:  Labetolol   Do NOT wear jewelry (body piercing), metal hair clips/bobby pins, make-up, or nail polish. Do NOT wear lotions, powders, or perfumes.  You may wear deoderant. Do NOT shave for 48 hours prior to surgery. Do NOT bring valuables to the hospital. Contacts, dentures, or bridgework may not be worn into surgery.. Have a responsible adult drive you home and stay with you for 24 hours after your procedure.

## 2014-10-08 MED ORDER — LACTATED RINGERS IV SOLN: INTRAVENOUS | Status: DC

## 2014-10-08 NOTE — Anesthesia Preprocedure Evaluation (Addendum)
Anesthesia Evaluation  Patient identified by MRN, date of birth, ID band Patient awake    Reviewed: Allergy & Precautions, NPO status , Patient's Chart, lab work & pertinent test results  History of Anesthesia Complications Negative for: history of anesthetic complications  Airway Mallampati: III  TM Distance: >3 FB Neck ROM: Full    Dental no notable dental hx. (+) Dental Advisory Given   Pulmonary Current Smoker,  breath sounds clear to auscultation  Pulmonary exam normal       Cardiovascular hypertension, Pt. on medications and Pt. on home beta blockers Normal cardiovascular examRhythm:Regular Rate:Normal     Neuro/Psych negative neurological ROS  negative psych ROS   GI/Hepatic negative GI ROS, Neg liver ROS,   Endo/Other  diabetesMorbid obesity  Renal/GU negative Renal ROS  negative genitourinary   Musculoskeletal negative musculoskeletal ROS (+)   Abdominal   Peds negative pediatric ROS (+)  Hematology negative hematology ROS (+)   Anesthesia Other Findings   Reproductive/Obstetrics (+) Pregnancy                            Anesthesia Physical Anesthesia Plan  ASA: III  Anesthesia Plan: Spinal   Post-op Pain Management:    Induction:   Airway Management Planned:   Additional Equipment:   Intra-op Plan:   Post-operative Plan:   Informed Consent: I have reviewed the patients History and Physical, chart, labs and discussed the procedure including the risks, benefits and alternatives for the proposed anesthesia with the patient or authorized representative who has indicated his/her understanding and acceptance.   Dental advisory given  Plan Discussed with: CRNA  Anesthesia Plan Comments:         Anesthesia Quick Evaluation

## 2014-10-09 ENCOUNTER — Encounter (HOSPITAL_COMMUNITY): Payer: Self-pay | Admitting: Anesthesiology

## 2014-10-09 ENCOUNTER — Ambulatory Visit (HOSPITAL_COMMUNITY)
Admission: RE | Admit: 2014-10-09 | Discharge: 2014-10-09 | Disposition: A | Discharge status: Home / Self Care | Payer: BLUE CROSS/BLUE SHIELD | Source: Ambulatory Visit | Attending: Obstetrics and Gynecology | Admitting: Obstetrics and Gynecology

## 2014-10-09 ENCOUNTER — Ambulatory Visit (HOSPITAL_COMMUNITY): Payer: BLUE CROSS/BLUE SHIELD | Admitting: Anesthesiology

## 2014-10-09 ENCOUNTER — Encounter (HOSPITAL_COMMUNITY)
Admission: RE | Disposition: A | Discharge status: Home / Self Care | Payer: Self-pay | Source: Ambulatory Visit | Attending: Obstetrics and Gynecology

## 2014-10-09 DIAGNOSIS — O3432 Maternal care for cervical incompetence, second trimester: Secondary | ICD-10-CM | POA: Insufficient documentation

## 2014-10-09 DIAGNOSIS — O162 Unspecified maternal hypertension, second trimester: Secondary | ICD-10-CM | POA: Insufficient documentation

## 2014-10-09 DIAGNOSIS — O99332 Smoking (tobacco) complicating pregnancy, second trimester: Secondary | ICD-10-CM | POA: Diagnosis not present

## 2014-10-09 DIAGNOSIS — O3482 Maternal care for other abnormalities of pelvic organs, second trimester: Secondary | ICD-10-CM | POA: Diagnosis not present

## 2014-10-09 DIAGNOSIS — O99212 Obesity complicating pregnancy, second trimester: Secondary | ICD-10-CM | POA: Insufficient documentation

## 2014-10-09 DIAGNOSIS — Z3A17 17 weeks gestation of pregnancy: Secondary | ICD-10-CM | POA: Diagnosis not present

## 2014-10-09 DIAGNOSIS — Z6841 Body Mass Index (BMI) 40.0 and over, adult: Secondary | ICD-10-CM | POA: Diagnosis not present

## 2014-10-09 DIAGNOSIS — O24410 Gestational diabetes mellitus in pregnancy, diet controlled: Secondary | ICD-10-CM | POA: Insufficient documentation

## 2014-10-09 DIAGNOSIS — E282 Polycystic ovarian syndrome: Secondary | ICD-10-CM | POA: Diagnosis not present

## 2014-10-09 HISTORY — PX: CERVICAL CERCLAGE: SHX1329

## 2014-10-09 LAB — GLUCOSE, CAPILLARY
GLUCOSE-CAPILLARY: 108 mg/dL — AB (ref 65–99)
Glucose-Capillary: 81 mg/dL (ref 65–99)

## 2014-10-09 SURGERY — CERCLAGE, CERVIX, VAGINAL APPROACH
Anesthesia: Spinal | Site: Vagina

## 2014-10-09 MED ORDER — BUPIVACAINE IN DEXTROSE 0.75-8.25 % IT SOLN
INTRATHECAL | Status: DC | PRN
  Administered 2014-10-09: 1.2 mg via INTRATHECAL

## 2014-10-09 MED ORDER — LACTATED RINGERS IV SOLN
INTRAVENOUS | Status: DC
  Administered 2014-10-09 (×2): via INTRAVENOUS

## 2014-10-09 MED ORDER — SODIUM CHLORIDE 0.9 % IJ SOLN
Freq: Once | INTRAMUSCULAR | Status: AC
  Administered 2014-10-09: 15:00:00 via VAGINAL
  Filled 2014-10-09: qty 1

## 2014-10-09 SURGICAL SUPPLY — 19 items
CLOTH BEACON ORANGE TIMEOUT ST (SAFETY) ×3 IMPLANT
COUNTER NEEDLE 1200 MAGNETIC (NEEDLE) IMPLANT
GLOVE BIO SURGEON STRL SZ7.5 (GLOVE) ×3 IMPLANT
GLOVE BIOGEL PI IND STRL 7.5 (GLOVE) ×1 IMPLANT
GLOVE BIOGEL PI INDICATOR 7.5 (GLOVE) ×2
GOWN STRL REUS W/TWL LRG LVL3 (GOWN DISPOSABLE) ×6 IMPLANT
NS IRRIG 1000ML POUR BTL (IV SOLUTION) ×3 IMPLANT
PACK VAGINAL MINOR WOMEN LF (CUSTOM PROCEDURE TRAY) ×3 IMPLANT
PAD OB MATERNITY 4.3X12.25 (PERSONAL CARE ITEMS) ×3 IMPLANT
PAD PREP 24X48 CUFFED NSTRL (MISCELLANEOUS) ×3 IMPLANT
SUT MERSILENE 5MM BP 1 12 (SUTURE) ×3 IMPLANT
SUT PROLENE 1 CT 1 30 (SUTURE) ×3 IMPLANT
SYR BULB IRRIGATION 50ML (SYRINGE) ×3 IMPLANT
TOWEL OR 17X24 6PK STRL BLUE (TOWEL DISPOSABLE) ×6 IMPLANT
TRAY FOLEY CATH SILVER 14FR (SET/KITS/TRAYS/PACK) ×3 IMPLANT
TUBING NON-CON 1/4 X 20 CONN (TUBING) IMPLANT
TUBING NON-CON 1/4 X 20' CONN (TUBING)
WATER STERILE IRR 1000ML POUR (IV SOLUTION) ×3 IMPLANT
YANKAUER SUCT BULB TIP NO VENT (SUCTIONS) IMPLANT

## 2014-10-09 NOTE — H&P (Signed)
Robyn [name] is an 34 y.o. female.  Pt with h/o 17wk delivery and 2 prior c/s with type 2 DM and CHTN.  OB History: G5, P2   Patient's last menstrual period was 06/23/2014.    Past Medical History  Diagnosis Date  . Hypertension   . Pregnancy induced hypertension   . Herpes   . Left ovarian cyst 11/29/2011  . Smoker 11/29/2011  . PCOS (polycystic ovarian syndrome)   . Shortness of breath dyspnea     with exertion  . Diabetes mellitus without complication     diet controlled     Past Surgical History  Procedure Laterality Date  . Wisdom tooth extraction    . Cesarean section      C/S x 2    Family History  Problem Relation Age of Onset  . Hypertension Mother   . Diabetes Father   . Stroke Father   . Cancer Maternal Uncle   . Cancer Maternal Grandmother     Social History:  reports that she has been smoking Cigarettes.  She has a 3.25 pack-year smoking history. She has never used smokeless tobacco. She reports that she does not drink alcohol or use illicit drugs.  Allergies: No Known Allergies  Prescriptions prior to admission  Medication Sig Dispense Refill Last Dose  . labetalol (NORMODYNE) 200 MG tablet Take 1 tablet (200 mg total) by mouth 2 (two) times daily. 60 tablet 2 10/09/2014 at 0830  . Prenat w/o A Vit-FeFum-FePo-FA (CONCEPT OB) 130-92.4-1 MG CAPS Take 1 capsule by mouth daily. 30 capsule 5 10/08/2014 at Unknown time  . calcium carbonate (TUMS - DOSED IN MG ELEMENTAL CALCIUM) 500 MG chewable tablet Chew 2 tablets by mouth daily as needed for indigestion or heartburn.   Past Week at Unknown time    ROS Non-contributory  Blood pressure 131/73, pulse 86, temperature 99 F (37.2 C), temperature source Oral, resp. rate 16, last menstrual period 06/23/2014, SpO2 99 %. Physical Exam Lungs CTA bilaterally CV RRR Abd soft, NT FHT  150 by doppler Results for orders placed or performed during the hospital encounter of 10/09/14 (from the past 24 hour(s))   Glucose, capillary     Status: Abnormal   Collection Time: 10/09/14 11:59 AM  Result Value Ref Range   Glucose-Capillary 108 (H) 65 - 99 mg/dL    No results found.  Assessment/Plan: P2 at 3413 1/7wks with h/o incomptenet cervix scheduled for cerclage today.  Risks benefits and alternatives discussed with the patient including but not limited to bleeding infection and injury.  Questions answered and consent signed and witnessed.   Purcell NailsROBERTS,Ola Raap Y 10/09/2014, 1:38 PM

## 2014-10-09 NOTE — Anesthesia Postprocedure Evaluation (Signed)
  Anesthesia Post-op Note  Patient: Robyn RubensteinRasherra [name]  Procedure(s) Performed: Procedure(s): CERCLAGE CERVICAL (N/A)  Patient Location: PACU  Anesthesia Type:Spinal  Level of Consciousness: awake, alert  and oriented  Airway and Oxygen Therapy: Patient Spontanous Breathing  Post-op Pain: none  Post-op Assessment: Post-op Vital signs reviewed, Patient's Cardiovascular Status Stable, Respiratory Function Stable, Patent Airway, No signs of Nausea or vomiting, Adequate PO intake, Pain level controlled, No headache, No backache, No residual numbness and No residual motor weakness  Post-op Vital Signs: Reviewed and stable  Last Vitals:  Filed Vitals:   10/09/14 1630  BP: 143/66  Pulse: 79  Temp:   Resp: 25    Complications: No apparent anesthesia complications

## 2014-10-09 NOTE — Op Note (Signed)
Preop Diagnosis: 1.13 1/7wks 2.Cervical Incompetence   Postop Diagnosis: 1.13 1/7wks 2.Cervical Incompetence   Procedure: CERCLAGE CERVICAL   Anesthesia: Spinal   Anesthesiologist: Mal AmabileMichael Foster, MD   Attending: Osborn CohoAngela Laikyn Gewirtz, MD   Assistant: N/a  Findings:  Pathology: N/a  Fluids: 1500 cc  UOP: 200 cc  EBL: 10cc  Complications: None  Procedure:Then patient was taken to the operating room after the risks, benefits and alternatives discussed with the patient and consent signed and witnessed.  The patient was given a spinal per anesthesia and placed in the dorsal lithotomy position.  The patient was prepped and draped in the usual sterile fashion.  A cervical cerclage stitch was placed using Mersilene and the knot was tied anteriorly on the cervix with a stitch of 1 prolene at the base to help elevate knot if necessary when it comes time for removal.  Clindamycin douche was performed.  Membranes remained intact and post procedure fetal heart rate was 158.  Sponge, lap and needle count was correct and the patient was transferred to the recovery room in good condition.

## 2014-10-09 NOTE — Anesthesia Procedure Notes (Signed)
Spinal Patient location during procedure: OR Staffing Anesthesiologist: Korion Cuevas Performed by: anesthesiologist  Preanesthetic Checklist Completed: patient identified, site marked, surgical consent, pre-op evaluation, timeout performed, IV checked, risks and benefits discussed and monitors and equipment checked Spinal Block Patient position: sitting Prep: ChloraPrep Patient monitoring: continuous pulse ox, blood pressure and heart rate Approach: midline Location: L3-4 Injection technique: single-shot Needle Needle type: Sprotte  Needle gauge: 24 G Needle length: 9 cm Additional Notes Functioning IV was confirmed and monitors were applied. Sterile prep and drape, including hand hygiene and sterile gloves were used. The patient was positioned and the spine was prepped. The skin was anesthetized with lidocaine.  Free flow of clear CSF was obtained prior to injecting local anesthetic into the CSF.  The spinal needle aspirated freely following injection.  The needle was carefully withdrawn.  The patient tolerated the procedure well.  Maycel Riffe, MD    

## 2014-10-09 NOTE — Transfer of Care (Signed)
Immediate Anesthesia Transfer of Care Note  Patient: Robyn [name]  Procedure(s) Performed: Procedure(s): CERCLAGE CERVICAL (N/A)  Patient Location: PACU  Anesthesia Type:Regional and Spinal  Level of Consciousness: awake, alert , oriented and patient cooperative  Airway & Oxygen Therapy: Patient Spontanous Breathing  Post-op Assessment: Report given to RN and Post -op Vital signs reviewed and stable  Post vital signs: Reviewed and stable  Last Vitals:  Filed Vitals:   10/09/14 1141  BP: 131/73  Pulse: 86  Temp: 37.2 C  Resp: 16    Complications: No apparent anesthesia complications

## 2014-10-12 ENCOUNTER — Encounter (HOSPITAL_COMMUNITY): Payer: Self-pay | Admitting: Obstetrics and Gynecology

## 2014-11-19 ENCOUNTER — Encounter: Payer: BLUE CROSS/BLUE SHIELD | Attending: Family Medicine | Admitting: *Deleted

## 2014-11-19 ENCOUNTER — Encounter: Payer: Self-pay | Admitting: *Deleted

## 2014-11-19 VITALS — Ht 67.0 in | Wt 315.2 lb

## 2014-11-19 DIAGNOSIS — E1165 Type 2 diabetes mellitus with hyperglycemia: Secondary | ICD-10-CM | POA: Insufficient documentation

## 2014-11-19 DIAGNOSIS — IMO0001 Reserved for inherently not codable concepts without codable children: Secondary | ICD-10-CM

## 2014-11-19 DIAGNOSIS — Z713 Dietary counseling and surveillance: Secondary | ICD-10-CM | POA: Diagnosis not present

## 2014-11-19 DIAGNOSIS — Z6841 Body Mass Index (BMI) 40.0 and over, adult: Secondary | ICD-10-CM | POA: Diagnosis not present

## 2014-11-19 DIAGNOSIS — Z794 Long term (current) use of insulin: Secondary | ICD-10-CM | POA: Diagnosis not present

## 2014-11-19 NOTE — Progress Notes (Signed)
Insulin Instruction  Patient was seen on 11/22/14 for insulin instruction.  The following learning objectives were met by the patient during this visit:   Insulin Action of Regular and NPH insulins  Reviewed syringe & vial VS pen including # units per syringe,    length of needles, vial VS Pen cartridge and needles  Hygiene and storage  Drawing up single and mixed doses if using vials   Single dose   Mixed dose:   Rotation of Sites  Hypoglycemia- symptoms, causes , treatment choices  Record keeping and MD follow up  Hypoglycemia, causes, symptoms and treatment   Patient demonstrated understanding of insulin administration by return demonstration.  Patient received the following handouts:  Insulin Instruction Handout  Insulin Injection Site Handout                                        Patient to start on insulin as Rx'd by MD  Patient will be seen for follow-up as needed.

## 2014-11-22 ENCOUNTER — Inpatient Hospital Stay (HOSPITAL_COMMUNITY)
Admission: AD | Admit: 2014-11-22 | Discharge: 2014-11-22 | Disposition: A | Discharge status: Home / Self Care | Payer: BLUE CROSS/BLUE SHIELD | Source: Ambulatory Visit | Attending: Obstetrics and Gynecology | Admitting: Obstetrics and Gynecology

## 2014-11-22 ENCOUNTER — Encounter (HOSPITAL_COMMUNITY): Payer: Self-pay

## 2014-11-22 DIAGNOSIS — IMO0001 Reserved for inherently not codable concepts without codable children: Secondary | ICD-10-CM | POA: Diagnosis present

## 2014-11-22 DIAGNOSIS — Z8751 Personal history of pre-term labor: Secondary | ICD-10-CM

## 2014-11-22 DIAGNOSIS — O09299 Supervision of pregnancy with other poor reproductive or obstetric history, unspecified trimester: Secondary | ICD-10-CM

## 2014-11-22 DIAGNOSIS — Z3493 Encounter for supervision of normal pregnancy, unspecified, third trimester: Secondary | ICD-10-CM | POA: Diagnosis present

## 2014-11-22 DIAGNOSIS — O09529 Supervision of elderly multigravida, unspecified trimester: Secondary | ICD-10-CM

## 2014-11-22 DIAGNOSIS — Z3A19 19 weeks gestation of pregnancy: Secondary | ICD-10-CM | POA: Insufficient documentation

## 2014-11-22 DIAGNOSIS — I10 Essential (primary) hypertension: Secondary | ICD-10-CM | POA: Diagnosis present

## 2014-11-22 DIAGNOSIS — E1165 Type 2 diabetes mellitus with hyperglycemia: Secondary | ICD-10-CM

## 2014-11-22 LAB — URINALYSIS, ROUTINE W REFLEX MICROSCOPIC
Bilirubin Urine: NEGATIVE
KETONES UR: 15 mg/dL — AB
LEUKOCYTES UA: NEGATIVE
Nitrite: NEGATIVE
Protein, ur: NEGATIVE mg/dL
SPECIFIC GRAVITY, URINE: 1.025 (ref 1.005–1.030)
UROBILINOGEN UA: 1 mg/dL (ref 0.0–1.0)
pH: 6 (ref 5.0–8.0)

## 2014-11-22 LAB — BASIC METABOLIC PANEL
Anion gap: 4 — ABNORMAL LOW (ref 5–15)
BUN: 6 mg/dL (ref 6–20)
CO2: 22 mmol/L (ref 22–32)
Calcium: 9 mg/dL (ref 8.9–10.3)
Chloride: 109 mmol/L (ref 101–111)
Creatinine, Ser: 0.42 mg/dL — ABNORMAL LOW (ref 0.44–1.00)
GFR calc Af Amer: >60 mL/min (ref >60)
GFR calc non Af Amer: >60 mL/min (ref >60)
GLUCOSE: 179 mg/dL — AB (ref 65–99)
POTASSIUM: 3.6 mmol/L (ref 3.5–5.1)
SODIUM: 135 mmol/L (ref 135–145)

## 2014-11-22 LAB — URINE MICROSCOPIC-ADD ON

## 2014-11-22 MED ORDER — SODIUM CHLORIDE 0.9 % IV BOLUS (SEPSIS)
1000.0000 mL | Freq: Once | INTRAVENOUS | Status: AC
  Administered 2014-11-22: 1000 mL via INTRAVENOUS

## 2014-11-22 NOTE — Discharge Instructions (Signed)

## 2014-11-22 NOTE — MAU Note (Signed)
Pt here with c/o high blood sugar on her last check at 1830, 262. Had eaten at 1500.

## 2014-11-22 NOTE — MAU Provider Note (Signed)
History  The pt is a 34 yo [id number] @ 19.3 wks w/ h/o Type 2 DM who presents to MAU w/ c/o BG 262 at 18:30 this evening. States fatigue is what prompted the fingerstick.  Normally checks sugar fasting, midday and at night. Did not bring log to review.  Reports fasting sugars are normally 160-180, midday 150-190 and night 190-248. Fasting this morning was 165. Did not check midday because she was asleep.  Last meal was late morning and consisted of eggs, bacon, waffles and biscuit.  Saw Dietician on 6/23 for consultation. States will soon be starting Regular Insulin. Currently takes NPH 18 units every am and at night.   Denies N/V, abdominal pain, hyperventilation or obtunded mental state. Does report polyuria and polydipsia.  Stopped smoking 4 mos ago and was praised for doing so.  +Quickening. Denies leaking, bleeding or cramping.  Patient Active Problem List   Diagnosis Date Noted  . Hypertension - on Labetalol 200 mg po bid 11/22/2014  . Diabetes mellitus type 2, uncontrolled, without complications -- dx'd in 2011 11/22/2014  . Hx of incompetent cervix, currently pregnant - cerclage placed on 10/09/14 by Dr. Su Hilt 11/22/2014  . History of preterm delivery x 2 - weekly 17P injections - first injection at 16.5 wks 11/22/2014  . Elderly multigravida 11/22/2014  . PCOS (polycystic ovarian syndrome) 08/21/2014  . Herpes 08/21/2014  . ANA positive 08/21/2014  . Previous cesarean delivery, antepartum condition or complication x 2--previous vertical incision 08/21/2014  . Obesity 05/20/2012    Chief Complaint  Patient presents with  . Hyperglycemia   HPI As above OB History    Gravida Para Term Preterm AB TAB SAB Ectopic Multiple Living   5 2  2 2 1 1   2       Past Medical History  Diagnosis Date  . Hypertension   . Pregnancy induced hypertension   . Herpes   . Left ovarian cyst 11/29/2011  . Smoker 11/29/2011  . PCOS (polycystic ovarian syndrome)   . Shortness of breath  dyspnea     with exertion  . Diabetes mellitus without complication     diet controlled    Quit smoking 4 months ago  Past Surgical History  Procedure Laterality Date  . Wisdom tooth extraction    . Cesarean section      C/S x 2  . Cervical cerclage N/A 10/09/2014    Procedure: CERCLAGE CERVICAL;  Surgeon: Osborn Coho, MD;  Location: [facility];  Service: Gynecology;  Laterality: N/A;    Family History  Problem Relation Age of Onset  . Hypertension Mother   . Diabetes Father   . Stroke Father   . Cancer Maternal Uncle   . Cancer Maternal Grandmother     History  Substance Use Topics  . Smoking status: Current Every Day Smoker -- 0.25 packs/day for 13 years    Types: Cigarettes    Last Attempt to Quit: 08/14/2014  . Smokeless tobacco: Never Used  . Alcohol Use: No     Comment: socially and rarely    Allergies: No Known Allergies  No prescriptions prior to admission    ROS  As detailed in History Physical Exam   Results for orders placed or performed during the hospital encounter of 11/22/14 (from the past 24 hour(s))  Urinalysis, Routine w reflex microscopic (not at [facility])     Status: Abnormal   Collection Time: 11/22/14  8:35 PM  Result Value Ref Range   Color, Urine YELLOW  YELLOW   APPearance CLEAR CLEAR   Specific Gravity, Urine 1.025 1.005 - 1.030   pH 6.0 5.0 - 8.0   Glucose, UA >1000 (A) NEGATIVE mg/dL   Hgb urine dipstick TRACE (A) NEGATIVE   Bilirubin Urine NEGATIVE NEGATIVE   Ketones, ur 15 (A) NEGATIVE mg/dL   Protein, ur NEGATIVE NEGATIVE mg/dL   Urobilinogen, UA 1.0 0.0 - 1.0 mg/dL   Nitrite NEGATIVE NEGATIVE   Leukocytes, UA NEGATIVE NEGATIVE  Urine microscopic-add on     Status: Abnormal   Collection Time: 11/22/14  8:35 PM  Result Value Ref Range   Squamous Epithelial / LPF FEW (A) RARE   WBC, UA 3-6 <3 WBC/hpf   Bacteria, UA FEW (A) RARE  Basic metabolic panel     Status: Abnormal   Collection Time: 11/22/14  9:30 PM  Result Value Ref  Range   Sodium 135 135 - 145 mmol/L   Potassium 3.6 3.5 - 5.1 mmol/L   Chloride 109 101 - 111 mmol/L   CO2 22 22 - 32 mmol/L   Glucose, Bld 179 (H) 65 - 99 mg/dL   BUN 6 6 - 20 mg/dL   Creatinine, Ser 1.61 (L) 0.44 - 1.00 mg/dL   Calcium 9.0 8.9 - 09.6 mg/dL   GFR calc non Af Amer >60 >60 mL/min   GFR calc Af Amer >60 >60 mL/min   Anion gap 4 (L) 5 - 15   Blood pressure 131/59, pulse 81, temperature 98.1 F (36.7 C), temperature source Oral, resp. rate 20, height  (1.702 m), weight 144.697 kg (319 lb), last menstrual period 06/23/2014.  Today's Vitals   11/22/14 2041 11/22/14 2045 11/22/14 2141 11/22/14 2237  BP:  126/63  131/59  Pulse:  85  81  Temp: 98.1 F (36.7 C)   98.1 F (36.7 C)  TempSrc: Oral   Oral  Resp: 20   20  Height:    (1.702 m)   Weight:   144.697 kg (319 lb)     Physical Exam Gen: NAD Doptones: 162 bpm ED Course  Assessment: Poorly controlled pregestational DM. No evidence of DKA. IVFs administered.  Plan: Await lab results.  ADDENDUM: Elevated blood glucose with otherwise normal BMP. Discussed w/ Dr. Stefano Gaul -- no additional management at this time. Pt d/c'd home with SAB precautions. She is to keep appt on Tuesday 11/24/14 for 17P injection. Continue diabetic regimen, but encouraged dietary modification/better food choices.   Sherre Scarlet CNM, MS 11/22/2014 11:12 PM

## 2014-11-25 LAB — CULTURE, OB URINE

## 2014-12-01 LAB — OB RESULTS CONSOLE ANTIBODY SCREEN: ANTIBODY SCREEN: NEGATIVE

## 2014-12-08 ENCOUNTER — Ambulatory Visit: Payer: BLUE CROSS/BLUE SHIELD | Attending: Obstetrics and Gynecology

## 2014-12-08 DIAGNOSIS — M5431 Sciatica, right side: Secondary | ICD-10-CM | POA: Insufficient documentation

## 2014-12-08 DIAGNOSIS — O2690 Pregnancy related conditions, unspecified, unspecified trimester: Secondary | ICD-10-CM | POA: Insufficient documentation

## 2014-12-08 DIAGNOSIS — M545 Low back pain: Secondary | ICD-10-CM

## 2014-12-08 DIAGNOSIS — M5387 Other specified dorsopathies, lumbosacral region: Secondary | ICD-10-CM

## 2014-12-08 DIAGNOSIS — O26899 Other specified pregnancy related conditions, unspecified trimester: Secondary | ICD-10-CM

## 2014-12-08 DIAGNOSIS — M25552 Pain in left hip: Secondary | ICD-10-CM | POA: Diagnosis present

## 2014-12-08 NOTE — Therapy (Signed)
[facility] [address] [place], [place], [address] Phone: [phone]   Fax:  [phone]  Physical Therapy Evaluation  Patient Details  Name: Robyn [name] MRN: [id number] Date of Birth: [birth year] Referring Provider:  Osborn Coho, MD  Encounter Date: 12/08/2014      PT End of Session - 12/08/14 1139    Visit Number 1   Date for PT Re-Evaluation 02/02/15   PT Start Time 1100   PT Stop Time 1135   PT Time Calculation (min) 35 min   Activity Tolerance Patient tolerated treatment well   Behavior During Therapy [facility] for tasks assessed/performed      Past Medical History  Diagnosis Date  . Hypertension   . Pregnancy induced hypertension   . Herpes   . Left ovarian cyst 11/29/2011  . Smoker 11/29/2011  . PCOS (polycystic ovarian syndrome)   . Shortness of breath dyspnea     with exertion  . Diabetes mellitus without complication     diet controlled     Past Surgical History  Procedure Laterality Date  . Wisdom tooth extraction    . Cesarean section      C/S x 2  . Cervical cerclage N/A 10/09/2014    Procedure: CERCLAGE CERVICAL;  Surgeon: Osborn Coho, MD;  Location: [facility];  Service: Gynecology;  Laterality: N/A;    There were no vitals filed for this visit.  Visit Diagnosis:  Low back pain during pregnancy, unspecified trimester - Plan: PT plan of care cert/re-cert  Sciatica of right side associated with disorder of lumbosacral spine - Plan: PT plan of care cert/re-cert      Subjective Assessment - 12/08/14 1104    Subjective Pain started a month ago with insidious onset. Had previous sciatic pain with the last pregancy as well.    Limitations Sitting   How long can you sit comfortably? Varies day to day; can come on at 1 hour or at 30 minutes    How long can you stand comfortably? 30 minutes    How long can you walk comfortably? 30 minutes    Patient Stated Goals Reduce the pain, grocery  store, standing, sitting on hard surfaces,    Currently in Pain? Yes   Pain Score 0-No pain   Pain Location Hip   Pain Orientation Right   Pain Descriptors / Indicators Shooting   Pain Type Acute pain   Pain Radiating Towards Lt hip to lt. knee    Pain Onset More than a month ago   Pain Frequency Intermittent   Aggravating Factors  walking, sitting, standing    Pain Relieving Factors lay down, Tylenol,             [facility] PT Assessment - 12/08/14 0001    Assessment   Medical Diagnosis sciatica Lt side (M54.32)   Onset Date/Surgical Date 11/08/14   Next MD Visit Next Tuesday, see DR. every other week    Prior Therapy 4 years, sciatica pain    Precautions   Precautions Other (comment)  Pregnant    Restrictions   Weight Bearing Restrictions No   Balance Screen   Has the patient fallen in the past 6 months No   Has the patient had a decrease in activity level because of a fear of falling?  No   Is the patient reluctant to leave their home because of a fear of falling?  No   Home [employer] information [employer] residence  Living Arrangements Spouse/significant other;Children   Type of Home House   Home Access Stairs to enter  3 steps    Prior Function   Level of Independence Independent   Vocation Unemployed   Leisure Watch TV   Cognition   Overall Cognitive Status Within Functional Limits for tasks assessed   Observation/Other Assessments   Focus on Therapeutic Outcomes (FOTO)  54% limitation   ROM / Strength   AROM / PROM / Strength AROM;PROM;Strength   AROM   Overall AROM  Within functional limits for tasks performed;Deficits   Overall AROM Comments 50% deficit with forward flexion    PROM   Overall PROM  Deficits   Overall PROM Comments Bil. hip IR limited by 50%    Strength   Overall Strength Deficits   Overall Strength Comments 4/5 Rt. hamstring strength; limited due to pain   5/5 for all other LE    Special Tests    Special Tests --  Slump test:  + on Rt.    Ambulation/Gait   Ambulation/Gait Yes   Gait Comments Wide BOS                            PT Education - 12/08/14 1138    Education provided Yes   Education Details Body mechanics for the back, piriformis and hamstring stretch    Person(s) Educated Patient   Methods Explanation;Demonstration;Handout   Comprehension Verbalized understanding;Returned demonstration          PT Short Term Goals - 12/08/14 1307    PT SHORT TERM GOAL #1   Title Be independent with HEP    Time 4   Period Weeks   Status New   PT SHORT TERM GOAL #2   Title Verbalize understanding of proper spine body mechanics    Time 4   Period Weeks   Status New   PT SHORT TERM GOAL #3   Title Report 30% decrease in low back and Rt. leg pain with walking activites    Time 4   Period Weeks   Status New   PT SHORT TERM GOAL #4   Title Increase standing time to 35 minutes without being limited by Rt. leg pain    Time 4   Period Weeks   Status New           PT Long Term Goals - 12/08/14 1313    PT LONG TERM GOAL #1   Title Be independent of advanced HEP    Time 8   Period Weeks   Status New   PT LONG TERM GOAL #2   Title Reduce FOTO limitation to > or = 36% limitation    Time 8   Period Weeks   Status New   PT LONG TERM GOAL #3   Title Report 50% decrease in Rt. leg pain with walking at the grocery store    Time 8   Period Weeks   Status New   PT LONG TERM GOAL #4   Title Increase standing time to 45 minutes without increase in Rt. leg pain.    Time 8   Period Weeks   Status New               Plan - 12/08/14 1139    Clinical Impression Statement 34 y.o woman presents with sciatica pain due to pregnancy. Pain increases with increased standing/walking for more than 30 minutes; comes intermittently when she is sitting and  typically radiates from right hip to above the rt. knee. Pt reports that pain started about a month ago; she had the same sciatica pain  with previous pregancy 4 years ago. Pain is reproduced with Slump test and is alleviated with lumbar extension. Will benefit from skilled PT for flexiblity exercises and lumbar/core stabiliazation to decrease symptoms.    Pt will benefit from skilled therapeutic intervention in order to improve on the following deficits Obesity;Decreased endurance;Decreased activity tolerance;Pain;Decreased strength   PT Frequency 2x / week   PT Duration 8 weeks   PT Treatment/Interventions Moist Heat;Therapeutic exercise;Therapeutic activities;Neuromuscular re-education;Functional mobility training;Patient/family education;Passive range of motion;Energy conservation   PT Next Visit Plan Review use of ice for pain relief, review stretches, begin core stabilization in sitting/standing, begin with arm bike on sit fit    Consulted and Agree with Plan of Care Patient         Problem List Patient Active Problem List   Diagnosis Date Noted  . Hypertension - on Labetalol 200 mg po bid 11/22/2014  . Diabetes mellitus type 2, uncontrolled, without complications -- dx'd in 2011 11/22/2014  . Hx of incompetent cervix, currently pregnant - cerclage placed on 10/09/14 by Dr. Su Hiltoberts 11/22/2014  . History of preterm delivery x 2 - weekly 17P injections - first injection at 16.5 wks 11/22/2014  . Elderly multigravida 11/22/2014  . PCOS (polycystic ovarian syndrome) 08/21/2014  . Herpes 08/21/2014  . ANA positive 08/21/2014  . Previous cesarean delivery, antepartum condition or complication x 2--previous vertical incision 08/21/2014  . Obesity 05/20/2012   Reyes IvanMarian Ramondo Dietze, SPT 12/08/2014 2:34 PM During this treatment session, the therapist was present, participating in, and directing the treatment. TAKACS,KELLY, PT 12/08/2014, 2:34 PM  [facility] [address] [place], [place], [address] Phone: [phone]   Fax:  [phone]

## 2014-12-08 NOTE — Patient Instructions (Addendum)
   Lifting Principles  .Maintain proper posture and head alignment. .Slide object as close as possible before lifting. .Move obstacles out of the way. .Test before lifting; ask for help if too heavy. .Tighten stomach muscles without holding breath. .Use smooth movements; do not jerk. .Use legs to do the work, and pivot with feet. .Distribute the work load symmetrically and close to the center of trunk. .Push instead of pull whenever possible.   Squat down and hold basket close to stand. Use leg muscles to do the work.    Avoid twisting or bending back. Pivot around using foot movements, and bend at knees if needed when reaching for articles.        Getting Into / Out of Bed   Lower self to lie down on one side by raising legs and lowering head at the same time. Use arms to assist moving without twisting. Bend both knees to roll onto back if desired. To sit up, start from lying on side, and use same move-ments in reverse. Keep trunk aligned with legs.    Shift weight from front foot to back foot as item is lifted off shelf.    When leaning forward to pick object up from floor, extend one leg out behind. Keep back straight. Hold onto a sturdy support with other hand.      Sit upright, head facing forward. Try using a roll to support lower back. Keep shoulders relaxed, and avoid rounded back. Keep hips level with knees. Avoid crossing legs for long periods.  HIP: Hamstrings - Short Sitting   Rest leg on raised surface. Keep knee straight. Lift chest. Hold _20__ seconds. _2__ reps per set, 3x a day.   Copyright  VHI. All rights reserved.  Piriformis (Supine)   Put right ankle of left knee.  Hold __20__ seconds. Repeat __2__ times per set. Do __3__ sets per session. Do _3x___ sessions per day.  http://orth.exer.us/677   Copyright  VHI. All rights reserved.      [facility] [address], [address] [place], [place] [address] Phone #  [phone] Fax [phone]

## 2014-12-10 ENCOUNTER — Ambulatory Visit: Payer: BLUE CROSS/BLUE SHIELD

## 2014-12-16 ENCOUNTER — Ambulatory Visit: Payer: BLUE CROSS/BLUE SHIELD

## 2014-12-16 DIAGNOSIS — O26899 Other specified pregnancy related conditions, unspecified trimester: Principal | ICD-10-CM

## 2014-12-16 DIAGNOSIS — M5387 Other specified dorsopathies, lumbosacral region: Secondary | ICD-10-CM

## 2014-12-16 DIAGNOSIS — O2690 Pregnancy related conditions, unspecified, unspecified trimester: Secondary | ICD-10-CM | POA: Diagnosis not present

## 2014-12-16 DIAGNOSIS — M545 Low back pain, unspecified: Secondary | ICD-10-CM

## 2014-12-16 NOTE — Therapy (Signed)
[facility] [address], [address] [place], [place], [address] Phone: [phone]   Fax:  [phone]  Physical Therapy Treatment  Patient Details  Name: Robyn [name] MRN: [id number] Date of Birth: [birth year] Referring Provider:  Osborn Coho, MD  Encounter Date: 12/16/2014      PT End of Session - 12/16/14 1218    Visit Number 2   Date for PT Re-Evaluation 02/02/15   PT Start Time 1150   PT Stop Time 1230   PT Time Calculation (min) 40 min   Activity Tolerance Patient tolerated treatment well   Behavior During Therapy [facility] for tasks assessed/performed      Past Medical History  Diagnosis Date  . Hypertension   . Pregnancy induced hypertension   . Herpes   . Left ovarian cyst 11/29/2011  . Smoker 11/29/2011  . PCOS (polycystic ovarian syndrome)   . Shortness of breath dyspnea     with exertion  . Diabetes mellitus without complication     diet controlled     Past Surgical History  Procedure Laterality Date  . Wisdom tooth extraction    . Cesarean section      C/S x 2  . Cervical cerclage N/A 10/09/2014    Procedure: CERCLAGE CERVICAL;  Surgeon: Robyn Coho, MD;  Location: [facility];  Service: Gynecology;  Laterality: N/A;    There were no vitals filed for this visit.  Visit Diagnosis:  Low back pain during pregnancy, unspecified trimester  Sciatica of right side associated with disorder of lumbosacral spine      Subjective Assessment - 12/16/14 1154    Subjective Pt is currently in 2nd trimester. Has been doing stretches at home and they have been helpful. Has been doing a lot of lying down.    How long can you stand comfortably? 30 minutes    How long can you walk comfortably? 30 minutes    Currently in Pain? Other (Comment)  6/10 pain walking in the door, but 0/10 with sitting                          [facility] Adult PT Treatment/Exercise - 12/16/14 0001    Exercises   Exercises  Lumbar;Knee/Hip   Lumbar Exercises: Stretches   Active Hamstring Stretch 20 seconds;2 reps   Piriformis Stretch 20 seconds;2 reps  each side   Lumbar Exercises: Aerobic   UBE (Upper Arm Bike) 3 min forward/3 min backward, resistance 0    Lumbar Exercises: Standing   Heel Raises 10 reps  3 sets    Other Standing Lumbar Exercises step ups 3x10/lateral step up 3x10   1 UE support; rest breaks between each set    Other Standing Lumbar Exercises standing hip abduction/extension 3x10   rest break in between sets    Lumbar Exercises: Supine   Other Supine Lumbar Exercises TA activation; 5x hold for 5 seconds    Knee/Hip Exercises: Standing   SLS --                  PT Short Term Goals - 12/16/14 1157    PT SHORT TERM GOAL #1   Title Be independent with HEP   Reports performing stretching exercises at home daily    Time 4   Period Weeks   Status Achieved   PT SHORT TERM GOAL #2   Title Verbalize understanding of proper spine body mechanics   Verbalized understanding of proper body mechanics;  reports that she knew had this information from a previous job    Time 4   Period Weeks   Status On-going   PT SHORT TERM GOAL #3   Title Report 30% decrease in low back and Rt. leg pain with walking activites   Does not report any decrease in pain.    Time 4   Period Weeks   Status On-going   PT SHORT TERM GOAL #4   Title Increase standing time to 35 minutes without being limited by Rt. leg pain   Only able to stand for about 20-30 minutes before pain sets in    Time 4   Period Weeks   Status On-going           PT Long Term Goals - 12/08/14 1313    PT LONG TERM GOAL #1   Title Be independent of advanced HEP    Time 8   Period Weeks   Status New   PT LONG TERM GOAL #2   Title Reduce FOTO limitation to > or = 36% limitation    Time 8   Period Weeks   Status New   PT LONG TERM GOAL #3   Title Report 50% decrease in Rt. leg pain with walking at the grocery store     Time 8   Period Weeks   Status New   PT LONG TERM GOAL #4   Title Increase standing time to 45 minutes without increase in Rt. leg pain.    Time 8   Period Weeks   Status New               Plan - 12/16/14 1224    Clinical Impression Statement Reports that stretching has helped a little bit but continues to have pain with standing and walking. Reports increased pain with weight shifting when standing. Performed standing LE exercises; pt required frequent rest breaks secondary to fatigue and reported 1/10 low back pain after 30 minutes of standing exercises. Pt demonstrated appropriate stretching exercises for HEP. Will continue to benefit from continued core stabilization exericses and LE strengthening to increase tolerance for standing/ambulation activities.   Pt will benefit from skilled therapeutic intervention in order to improve on the following deficits Obesity;Decreased endurance;Decreased activity tolerance;Pain;Decreased strength   Rehab Potential Good   PT Frequency 2x / week   PT Duration 8 weeks   PT Treatment/Interventions Moist Heat;Therapeutic exercise;Therapeutic activities;Neuromuscular re-education;Functional mobility training;Patient/family education;Passive range of motion;Energy conservation   PT Next Visit Plan Try arm bike with sit fit, continue with core stabilization exercises in supine (TA with abduction), standing child's pose stretch, pelvic tilts at the wall, SLS    Consulted and Agree with Plan of Care Patient        Problem List Patient Active Problem List   Diagnosis Date Noted  . Hypertension - on Labetalol 200 mg po bid 11/22/2014  . Diabetes mellitus type 2, uncontrolled, without complications -- dx'd in 2011 11/22/2014  . Hx of incompetent cervix, currently pregnant - cerclage placed on 10/09/14 by Dr. Su Hilt 11/22/2014  . History of preterm delivery x 2 - weekly 17P injections - first injection at 16.5 wks 11/22/2014  . Elderly multigravida  11/22/2014  . PCOS (polycystic ovarian syndrome) 08/21/2014  . Herpes 08/21/2014  . ANA positive 08/21/2014  . Previous cesarean delivery, antepartum condition or complication x 2--previous vertical incision 08/21/2014  . Obesity 05/20/2012   Reyes Ivan, SPT 12/16/2014 1:17 PM   During this treatment session, the  therapist was present, participating in, and directing the treatment. TAKACS,KELLY, PT 12/16/2014, 1:17 PM  [facility] [address], [address] [place], [place], [address] Phone: [phone]   Fax:  [phone]

## 2014-12-17 ENCOUNTER — Other Ambulatory Visit (HOSPITAL_COMMUNITY): Payer: Self-pay | Admitting: Obstetrics and Gynecology

## 2014-12-17 DIAGNOSIS — O3432 Maternal care for cervical incompetence, second trimester: Secondary | ICD-10-CM

## 2014-12-17 DIAGNOSIS — Z794 Long term (current) use of insulin: Secondary | ICD-10-CM

## 2014-12-17 DIAGNOSIS — E119 Type 2 diabetes mellitus without complications: Secondary | ICD-10-CM

## 2014-12-17 DIAGNOSIS — Z3A24 24 weeks gestation of pregnancy: Secondary | ICD-10-CM

## 2014-12-17 DIAGNOSIS — IMO0001 Reserved for inherently not codable concepts without codable children: Secondary | ICD-10-CM

## 2014-12-17 DIAGNOSIS — O09212 Supervision of pregnancy with history of pre-term labor, second trimester: Secondary | ICD-10-CM

## 2014-12-17 DIAGNOSIS — Z3689 Encounter for other specified antenatal screening: Secondary | ICD-10-CM

## 2014-12-23 ENCOUNTER — Ambulatory Visit: Payer: BLUE CROSS/BLUE SHIELD | Admitting: Physical Therapy

## 2014-12-23 ENCOUNTER — Encounter: Payer: Self-pay | Admitting: Physical Therapy

## 2014-12-23 DIAGNOSIS — O2690 Pregnancy related conditions, unspecified, unspecified trimester: Secondary | ICD-10-CM | POA: Diagnosis not present

## 2014-12-23 DIAGNOSIS — M5387 Other specified dorsopathies, lumbosacral region: Secondary | ICD-10-CM

## 2014-12-23 DIAGNOSIS — M545 Low back pain: Secondary | ICD-10-CM

## 2014-12-23 DIAGNOSIS — O26899 Other specified pregnancy related conditions, unspecified trimester: Principal | ICD-10-CM

## 2014-12-23 NOTE — Therapy (Signed)
[facility] [address], [address] [place], [place], [address] Phone: [phone]   Fax:  [phone]  Physical Therapy Treatment  Patient Details  Name: Robyn [name] MRN: [id number] Date of Birth: 1980-12-09 Referring Provider:  Osborn Coho, MD  Encounter Date: 12/23/2014      PT End of Session - 12/23/14 1158    Visit Number 3   Date for PT Re-Evaluation 02/02/15   PT Start Time 1106   PT Stop Time 1147   PT Time Calculation (min) 41 min   Activity Tolerance Patient tolerated treatment well   Behavior During Therapy [facility] for tasks assessed/performed      Past Medical History  Diagnosis Date  . Hypertension   . Pregnancy induced hypertension   . Herpes   . Left ovarian cyst 11/29/2011  . Smoker 11/29/2011  . PCOS (polycystic ovarian syndrome)   . Shortness of breath dyspnea     with exertion  . Diabetes mellitus without complication     diet controlled     Past Surgical History  Procedure Laterality Date  . Wisdom tooth extraction    . Cesarean section      C/S x 2  . Cervical cerclage N/A 10/09/2014    Procedure: CERCLAGE CERVICAL;  Surgeon: Osborn Coho, MD;  Location: [facility];  Service: Gynecology;  Laterality: N/A;    There were no vitals filed for this visit.  Visit Diagnosis:  Low back pain during pregnancy, unspecified trimester  Sciatica of right side associated with disorder of lumbosacral spine      Subjective Assessment - 12/23/14 1128    Subjective Pt is in 2nd trimester (24 weeks). she complains of Left lhip and leg pain, what disturbs her night sleep   Limitations Sitting  slleping   Pain Score 8    Pain Location Hip   Pain Orientation Left   Pain Descriptors / Indicators Aching   Pain Type Acute pain   Pain Radiating Towards Lt hip to left knee   Pain Onset More than a month ago   Pain Frequency Intermittent   Multiple Pain Sites No                          [facility] Adult PT Treatment/Exercise - 12/23/14 0001    Exercises   Exercises Lumbar;Knee/Hip   Lumbar Exercises: Stretches   Active Hamstring Stretch 20 seconds;2 reps   Piriformis Stretch 20 seconds;2 reps  in supine   Lumbar Exercises: Aerobic   UBE (Upper Arm Bike) 8 min (4/4)   Lumbar Exercises: Seated   Other Seated Lumbar Exercises sitting on green ball pelvic ant/post, side/side x 1 min x 2    Other Seated Lumbar Exercises breathing awarness, pt was with wrong pattern   Lumbar Exercises: Supine   Other Supine Lumbar Exercises Sitting on green physioball D2 2x10 each side, and horizontal abduction 2x10   Manual Therapy   Manual Therapy Soft tissue mobilization  to Left gluteal and hip area in supine and RT sidelying                  PT Short Term Goals - 12/16/14 1157    PT SHORT TERM GOAL #1   Title Be independent with HEP   Reports performing stretching exercises at home daily    Time 4   Period Weeks   Status Achieved   PT SHORT TERM GOAL #2   Title Verbalize understanding of  proper spine body mechanics   Verbalized understanding of proper body mechanics; reports that she knew had this information from a previous job    Time 4   Period Weeks   Status On-going   PT SHORT TERM GOAL #3   Title Report 30% decrease in low back and Rt. leg pain with walking activites   Does not report any decrease in pain.    Time 4   Period Weeks   Status On-going   PT SHORT TERM GOAL #4   Title Increase standing time to 35 minutes without being limited by Rt. leg pain   Only able to stand for about 20-30 minutes before pain sets in    Time 4   Period Weeks   Status On-going           PT Long Term Goals - 12/08/14 1313    PT LONG TERM GOAL #1   Title Be independent of advanced HEP    Time 8   Period Weeks   Status New   PT LONG TERM GOAL #2   Title Reduce FOTO limitation to > or = 36% limitation    Time 8   Period Weeks   Status New   PT LONG TERM GOAL #3    Title Report 50% decrease in Rt. leg pain with walking at the grocery store    Time 8   Period Weeks   Status New   PT LONG TERM GOAL #4   Title Increase standing time to 45 minutes without increase in Rt. leg pain.    Time 8   Period Weeks   Status New               Plan - 12/23/14 1159    Clinical Impression Statement Pt reports sleep is disturbed, and standing and walking is limited due to pain. Pt felt relieve with sitting on physioball and perfroming activities   Pt will benefit from skilled therapeutic intervention in order to improve on the following deficits Obesity;Decreased endurance;Decreased activity tolerance;Pain;Decreased strength   Rehab Potential Good   PT Frequency 2x / week   PT Duration 8 weeks   PT Treatment/Interventions Moist Heat;Therapeutic exercise;Therapeutic activities;Neuromuscular re-education;Functional mobility training;Patient/family education;Passive range of motion;Energy conservation   PT Next Visit Plan Use sit fit with UBE, continue with core stabilization exercises in supine (TA with abduction), and physioball    Consulted and Agree with Plan of Care Patient        Problem List Patient Active Problem List   Diagnosis Date Noted  . Hypertension - on Labetalol 200 mg po bid 11/22/2014  . Diabetes mellitus type 2, uncontrolled, without complications -- dx'd in 2011 11/22/2014  . Hx of incompetent cervix, currently pregnant - cerclage placed on 10/09/14 by Dr. Su Hilt 11/22/2014  . History of preterm delivery x 2 - weekly 17P injections - first injection at 16.5 wks 11/22/2014  . Elderly multigravida 11/22/2014  . PCOS (polycystic ovarian syndrome) 08/21/2014  . Herpes 08/21/2014  . ANA positive 08/21/2014  . Previous cesarean delivery, antepartum condition or complication x 2--previous vertical incision 08/21/2014  . Obesity 05/20/2012    NAUMANN-HOUEGNIFIO,Jesson Foskey PTA 12/23/2014, 12:18 PM  [facility] [address] [place], [place], [address] Phone: [phone]   Fax:  [phone]

## 2014-12-24 ENCOUNTER — Ambulatory Visit: Payer: BLUE CROSS/BLUE SHIELD

## 2014-12-24 ENCOUNTER — Ambulatory Visit: Payer: BLUE CROSS/BLUE SHIELD | Admitting: Physical Therapy

## 2014-12-24 DIAGNOSIS — O2690 Pregnancy related conditions, unspecified, unspecified trimester: Secondary | ICD-10-CM | POA: Diagnosis not present

## 2014-12-24 DIAGNOSIS — O26899 Other specified pregnancy related conditions, unspecified trimester: Principal | ICD-10-CM

## 2014-12-24 DIAGNOSIS — M545 Low back pain: Secondary | ICD-10-CM

## 2014-12-24 DIAGNOSIS — M5387 Other specified dorsopathies, lumbosacral region: Secondary | ICD-10-CM

## 2014-12-24 DIAGNOSIS — M25552 Pain in left hip: Secondary | ICD-10-CM

## 2014-12-24 NOTE — Therapy (Signed)
[facility] [address] [place], [place], [address] Phone: [phone]   Fax:  [phone]  Physical Therapy Treatment  Patient Details  Name: Robyn [name] MRN: [id number] Date of Birth: [birth year] Referring Provider:  Devra Dopp, MD  Encounter Date: 12/24/2014      PT End of Session - 12/24/14 1614    Visit Number 4   Date for PT Re-Evaluation 02/02/15   PT Start Time 1535   PT Stop Time 1614   PT Time Calculation (min) 39 min   Activity Tolerance Patient tolerated treatment well   Behavior During Therapy [facility] for tasks assessed/performed      Past Medical History  Diagnosis Date  . Hypertension   . Pregnancy induced hypertension   . Herpes   . Left ovarian cyst 11/29/2011  . Smoker 11/29/2011  . PCOS (polycystic ovarian syndrome)   . Shortness of breath dyspnea     with exertion  . Diabetes mellitus without complication     diet controlled     Past Surgical History  Procedure Laterality Date  . Wisdom tooth extraction    . Cesarean section      C/S x 2  . Cervical cerclage N/A 10/09/2014    Procedure: CERCLAGE CERVICAL;  Surgeon: Osborn Coho, MD;  Location: [facility];  Service: Gynecology;  Laterality: N/A;    There were no vitals filed for this visit.  Visit Diagnosis:  Low back pain during pregnancy, unspecified trimester  Sciatica of right side associated with disorder of lumbosacral spine  Hip pain, left      Subjective Assessment - 12/24/14 1537    Subjective Pt felt better after last session but pain returned at night.     Currently in Pain? Other (Comment)  9/10 pain with sleep on the Lt side.   Pain Score 9    Pain Location Hip   Pain Orientation Left   Pain Descriptors / Indicators Aching   Pain Type Acute pain                         [facility] Adult PT Treatment/Exercise - 12/24/14 0001    Lumbar Exercises: Stretches   Active Hamstring Stretch 20  seconds;2 reps   Lumbar Exercises: Aerobic   UBE (Upper Arm Bike) Level 1x 8 min (4/4)   Lumbar Exercises: Seated   Other Seated Lumbar Exercises sitting on green ball pelvic ant/post, side/side x 1 min x 2    Lumbar Exercises: Supine   Other Supine Lumbar Exercises Sitting on green physioball D2 2x10 each side, and horizontal abduction 2x10   Manual Therapy   Manual Therapy Soft tissue mobilization  to Left gluteal and hip area in supine and RT sidelying   Manual therapy comments used orange roller                  PT Short Term Goals - 12/24/14 1538    PT SHORT TERM GOAL #1   Title Be independent with HEP    Time 4   Period Weeks   Status On-going  independent in current HEP   PT SHORT TERM GOAL #2   Title Verbalize understanding of proper spine body mechanics    Time 4   Status Achieved   PT SHORT TERM GOAL #3   Title Report 30% decrease in low back and Rt. leg pain with walking activites    Time 4   Period Weeks  Status On-going  no change in Rt leg pain with standing and walking           PT Long Term Goals - 12/08/14 1313    PT LONG TERM GOAL #1   Title Be independent of advanced HEP    Time 8   Period Weeks   Status New   PT LONG TERM GOAL #2   Title Reduce FOTO limitation to > or = 36% limitation    Time 8   Period Weeks   Status New   PT LONG TERM GOAL #3   Title Report 50% decrease in Rt. leg pain with walking at the grocery store    Time 8   Period Weeks   Status New   PT LONG TERM GOAL #4   Title Increase standing time to 45 minutes without increase in Rt. leg pain.    Time 8   Period Weeks   Status New               Plan - 12/24/14 1542    Clinical Impression Statement Pt with continued Lt hip pain with sleep at night that is rated 9/10 and Rt LE pain with standing activities.  Pt denies any change in pain levels yet with PT.  Pt is independent in HEP. Pt will benefit from skilled PT for continued flexibility, core strength  and mobility.   Pt will benefit from skilled therapeutic intervention in order to improve on the following deficits Obesity;Decreased endurance;Decreased activity tolerance;Pain;Decreased strength   Rehab Potential Good   PT Frequency 2x / week   PT Duration 8 weeks   PT Treatment/Interventions Moist Heat;Therapeutic exercise;Therapeutic activities;Neuromuscular re-education;Functional mobility training;Patient/family education;Passive range of motion;Energy conservation   PT Next Visit Plan Core stab, physioball, stretching   Consulted and Agree with Plan of Care Patient        Problem List Patient Active Problem List   Diagnosis Date Noted  . Hypertension - on Labetalol 200 mg po bid 11/22/2014  . Diabetes mellitus type 2, uncontrolled, without complications -- dx'd in 2011 11/22/2014  . Hx of incompetent cervix, currently pregnant - cerclage placed on 10/09/14 by Dr. Su Hilt 11/22/2014  . History of preterm delivery x 2 - weekly 17P injections - first injection at 16.5 wks 11/22/2014  . Elderly multigravida 11/22/2014  . PCOS (polycystic ovarian syndrome) 08/21/2014  . Herpes 08/21/2014  . ANA positive 08/21/2014  . Previous cesarean delivery, antepartum condition or complication x 2--previous vertical incision 08/21/2014  . Obesity 05/20/2012    Laylee Schooley, PT7/28/2016, 4:16 PM  [facility] [address], [address] [place], [place], [address] Phone: [phone]   Fax:  [phone]

## 2014-12-29 ENCOUNTER — Ambulatory Visit: Payer: BLUE CROSS/BLUE SHIELD | Attending: Obstetrics and Gynecology | Admitting: Physical Therapy

## 2014-12-29 ENCOUNTER — Other Ambulatory Visit (HOSPITAL_COMMUNITY): Payer: BLUE CROSS/BLUE SHIELD

## 2014-12-29 ENCOUNTER — Encounter: Payer: Self-pay | Admitting: Physical Therapy

## 2014-12-29 DIAGNOSIS — M5431 Sciatica, right side: Secondary | ICD-10-CM | POA: Insufficient documentation

## 2014-12-29 DIAGNOSIS — M25552 Pain in left hip: Secondary | ICD-10-CM | POA: Diagnosis present

## 2014-12-29 DIAGNOSIS — M545 Low back pain, unspecified: Secondary | ICD-10-CM

## 2014-12-29 DIAGNOSIS — O2690 Pregnancy related conditions, unspecified, unspecified trimester: Secondary | ICD-10-CM | POA: Insufficient documentation

## 2014-12-29 DIAGNOSIS — O26899 Other specified pregnancy related conditions, unspecified trimester: Secondary | ICD-10-CM

## 2014-12-29 DIAGNOSIS — M5387 Other specified dorsopathies, lumbosacral region: Secondary | ICD-10-CM

## 2014-12-29 NOTE — Therapy (Addendum)
[facility] [address], [place], [place], [address] Phone: [phone]   Fax:  [phone]  Physical Therapy Treatment  Patient Details  Name: Robyn [name] MRN: [id number] Date of Birth: 1980/07/23 Referring Provider:  Everett Graff, MD  Encounter Date: 12/29/2014      PT End of Session - 12/29/14 1319    Visit Number 5   Date for PT Re-Evaluation 02/02/15   PT Start Time 1243  pt arrived late   PT Stop Time 1316   PT Time Calculation (min) 33 min   Activity Tolerance Patient tolerated treatment well   Behavior During Therapy [facility] for tasks assessed/performed      Past Medical History  Diagnosis Date  . Hypertension   . Pregnancy induced hypertension   . Herpes   . Left ovarian cyst 11/29/2011  . Smoker 11/29/2011  . PCOS (polycystic ovarian syndrome)   . Shortness of breath dyspnea     with exertion  . Diabetes mellitus without complication     diet controlled     Past Surgical History  Procedure Laterality Date  . Wisdom tooth extraction    . Cesarean section      C/S x 2  . Cervical cerclage N/A 10/09/2014    Procedure: CERCLAGE CERVICAL;  Surgeon: Everett Graff, MD;  Location: [facility];  Service: Gynecology;  Laterality: N/A;    There were no vitals filed for this visit.  Visit Diagnosis:  Low back pain during pregnancy, unspecified trimester  Sciatica of right side associated with disorder of lumbosacral spine  Hip pain, left      Subjective Assessment - 12/29/14 1254    Subjective Pt reports 505 improvement with low back pain.  Pt reports has bought a physioball.   Limitations Sitting   Currently in Pain? Other (Comment)  last night 8-9/10   Pain Score 8    Pain Location Leg   Pain Orientation Left   Pain Descriptors / Indicators Aching   Pain Type Acute pain   Pain Radiating Towards Left hip to left knee   Pain Onset More than a month ago   Pain Frequency Intermittent   Multiple Pain Sites No                         [facility] Adult PT Treatment/Exercise - 12/29/14 0001    Exercises   Exercises Lumbar;Knee/Hip   Lumbar Exercises: Stretches   Active Hamstring Stretch 20 seconds;2 reps   Lumbar Exercises: Aerobic   UBE (Upper Arm Bike) Level 1x 49mn (3/3)  sitting on green physioball   Lumbar Exercises: Seated   Other Seated Lumbar Exercises sitting on green ball pelvic ant/post, side/side x 1 min x 2    Lumbar Exercises: Supine   Other Supine Lumbar Exercises Sitting on green physioball D2 2x10 each side, and horizontal abduction 2x10   Manual Therapy   Manual Therapy Soft tissue mobilization  to left gluteal and hip area in Rt sidelying   Manual therapy comments used orange roller                  PT Short Term Goals - 12/29/14 1303    PT SHORT TERM GOAL #1   Title Be independent with HEP    Time 4   Period Weeks   Status On-going   PT SHORT TERM GOAL #2   Title Verbalize understanding of proper spine body mechanics    Time 4  Period Weeks   Status Achieved   PT SHORT TERM GOAL #3   Title Report 30% decrease in low back and Rt. leg pain with walking activites    Time 4   Period Weeks   Status Achieved   PT SHORT TERM GOAL #4   Title Increase standing time to 35 minutes without being limited by Rt. leg pain    Time 4   Period Weeks   Status On-going           PT Long Term Goals - 12/29/14 1304    PT LONG TERM GOAL #1   Title Be independent of advanced HEP    Time 8   Period Weeks   Status On-going   PT LONG TERM GOAL #2   Title Reduce FOTO limitation to > or = 36% limitation    Time 8   Period Weeks   Status On-going   PT LONG TERM GOAL #3   Title Report 50% decrease in Rt. leg pain with walking at the grocery store    Time 8   Period Weeks   Status On-going   PT LONG TERM GOAL #4   Title Increase standing time to 45 minutes without increase in Rt. leg pain.    Time 8   Period Weeks   Status  On-going               Plan - 12/29/14 1320    Clinical Impression Statement Pt continues to have disturb sleep due to pain in left hip down to knee rated as 8-9/10 despite changing position and positioning with pillows. Pt reports all over improvement as 50%. Pt will continue to benefit from skilled PT  to address core strength, flexibility and mobility.   Pt will benefit from skilled therapeutic intervention in order to improve on the following deficits Obesity;Decreased endurance;Decreased activity tolerance;Pain;Decreased strength   Rehab Potential Good   PT Frequency 2x / week   PT Duration 8 weeks   PT Treatment/Interventions Moist Heat;Therapeutic exercise;Therapeutic activities;Neuromuscular re-education;Functional mobility training;Patient/family education;Passive range of motion;Energy conservation   PT Next Visit Plan Core stab, physioball, stretching   Consulted and Agree with Plan of Care Patient        Problem List Patient Active Problem List   Diagnosis Date Noted  . Hypertension - on Labetalol 200 mg po bid 11/22/2014  . Diabetes mellitus type 2, uncontrolled, without complications -- dx'd in 2011 11/22/2014  . Hx of incompetent cervix, currently pregnant - cerclage placed on 10/09/14 by Dr. Mancel Bale 11/22/2014  . History of preterm delivery x 2 - weekly 17P injections - first injection at 16.5 wks 11/22/2014  . Elderly multigravida 11/22/2014  . PCOS (polycystic ovarian syndrome) 08/21/2014  . Herpes 08/21/2014  . ANA positive 08/21/2014  . Previous cesarean delivery, antepartum condition or complication x 2--previous vertical incision 08/21/2014  . Obesity 05/20/2012    NAUMANN-HOUEGNIFIO,Symphoni Helbling PTA 12/29/2014, 1:24 PM PHYSICAL THERAPY DISCHARGE SUMMARY  Visits from Start of Care: 5  Current functional level related to goals / functional outcomes: See above for most current status.    Remaining deficits: Unknown as pt didn't return after appointment on  12/29/14.     Education / Equipment: HEP, Economist education Plan: Patient agrees to discharge.  Patient goals were partially met. Patient is being discharged due to not returning since the last visit.  ?????   Sigurd Sos, PT 02/02/2015 7:36 AM  [facility] [address]  [place], [place], [address] Phone: [phone]   Fax:  [phone]

## 2014-12-30 ENCOUNTER — Ambulatory Visit (HOSPITAL_COMMUNITY)
Admission: RE | Admit: 2014-12-30 | Discharge: 2014-12-30 | Disposition: A | Discharge status: Home / Self Care | Payer: BLUE CROSS/BLUE SHIELD | Source: Ambulatory Visit | Attending: Obstetrics and Gynecology | Admitting: Obstetrics and Gynecology

## 2014-12-30 ENCOUNTER — Other Ambulatory Visit (HOSPITAL_COMMUNITY): Payer: Self-pay | Admitting: Obstetrics and Gynecology

## 2014-12-30 ENCOUNTER — Encounter (HOSPITAL_COMMUNITY): Payer: Self-pay

## 2014-12-30 DIAGNOSIS — E119 Type 2 diabetes mellitus without complications: Secondary | ICD-10-CM | POA: Insufficient documentation

## 2014-12-30 DIAGNOSIS — O09212 Supervision of pregnancy with history of pre-term labor, second trimester: Secondary | ICD-10-CM | POA: Insufficient documentation

## 2014-12-30 DIAGNOSIS — Z794 Long term (current) use of insulin: Secondary | ICD-10-CM | POA: Insufficient documentation

## 2014-12-30 DIAGNOSIS — O3432 Maternal care for cervical incompetence, second trimester: Secondary | ICD-10-CM | POA: Insufficient documentation

## 2014-12-30 DIAGNOSIS — Z36 Encounter for antenatal screening of mother: Secondary | ICD-10-CM | POA: Diagnosis not present

## 2014-12-30 DIAGNOSIS — IMO0001 Reserved for inherently not codable concepts without codable children: Secondary | ICD-10-CM

## 2014-12-30 DIAGNOSIS — Z3A24 24 weeks gestation of pregnancy: Secondary | ICD-10-CM | POA: Diagnosis not present

## 2014-12-30 DIAGNOSIS — Z3689 Encounter for other specified antenatal screening: Secondary | ICD-10-CM

## 2014-12-30 DIAGNOSIS — O24312 Unspecified pre-existing diabetes mellitus in pregnancy, second trimester: Secondary | ICD-10-CM | POA: Diagnosis not present

## 2014-12-31 ENCOUNTER — Other Ambulatory Visit (HOSPITAL_COMMUNITY): Payer: Self-pay | Admitting: Obstetrics and Gynecology

## 2014-12-31 ENCOUNTER — Encounter (HOSPITAL_COMMUNITY): Payer: Self-pay | Admitting: Obstetrics and Gynecology

## 2015-01-01 ENCOUNTER — Ambulatory Visit: Payer: BLUE CROSS/BLUE SHIELD | Admitting: Physical Therapy

## 2015-01-13 ENCOUNTER — Ambulatory Visit (INDEPENDENT_AMBULATORY_CARE_PROVIDER_SITE_OTHER): Payer: BLUE CROSS/BLUE SHIELD | Admitting: Endocrinology

## 2015-01-13 VITALS — BP 142/62 | HR 93 | Temp 98.1°F | Wt 319.0 lb

## 2015-01-13 DIAGNOSIS — E119 Type 2 diabetes mellitus without complications: Secondary | ICD-10-CM | POA: Diagnosis not present

## 2015-01-13 LAB — POCT GLYCOSYLATED HEMOGLOBIN (HGB A1C): Hemoglobin A1C: 8.2

## 2015-01-13 MED ORDER — INSULIN ASPART PROT & ASPART (70-30 MIX) 100 UNIT/ML PEN: 40.0000 [IU] | PEN_INJECTOR | Freq: Two times a day (BID) | SUBCUTANEOUS | Status: DC

## 2015-01-13 MED ORDER — GLUCOSE BLOOD VI STRP: 1.0000 | ORAL_STRIP | Freq: Three times a day (TID) | Status: DC

## 2015-01-13 NOTE — Patient Instructions (Addendum)
good diet and exercise significantly improve the control of your diabetes.  please let me know if you wish to be referred to a dietician.  high blood sugar is very risky  to your health.  you should see an eye doctor and dentist every year.  It is very important to get all recommended vaccinations.  controlling your blood pressure and cholesterol drastically reduces the damage diabetes does to your body.  Those who smoke should quit.  please discuss these  with your doctor.  check your blood sugar 4 times a day: before the 3 meals, and at bedtime.  also check if you have symptoms of your blood sugar being too high or too low.  please  keep a record of the readings and bring it to your next appointment here.  You can write it on any piece of paper.  please call us sooner if your blood sugar goes  below 70, or if you have a lot of readings over 200.   During pregnancy, we want to push the sugar lower than outside of pregnancy, and we need to get there faster.  i have sent a prescription to your pharmacy, to change to "70/30."  Here is a discount card. Please come back for a follow-up appointment in 1 week.

## 2015-01-13 NOTE — Progress Notes (Signed)
Pre visit review using our clinic review tool, if applicable. No additional management support is needed unless otherwise documented below in the visit note. 

## 2015-01-13 NOTE — Progress Notes (Signed)
Subjective:    Patient ID: Robyn [name], female    DOB: [birth year], 34 y.o.   MRN: [id number]  HPI pt states DM was dx'ed in 2011, during a pregnancy, but it persisted afterward; she has mild if any neuropathy of the lower extremities; she is unaware of any associated chronic complications; she has been back on insulin since mid-2016, for another pregnancy (now 27 weeks); pt says her diet and exercise are "ok."; she has never had pancreatitis, severe hypoglycemia or DKA.  She takes NPH, 28 units bid.  She says cbg's vary from 118-267.  There is no trend throughout the day.   Past Medical History  Diagnosis Date  . Hypertension   . Pregnancy induced hypertension   . Herpes   . Left ovarian cyst 11/29/2011  . Smoker 11/29/2011  . PCOS (polycystic ovarian syndrome)   . Shortness of breath dyspnea     with exertion  . Diabetes mellitus without complication     diet controlled     Past Surgical History  Procedure Laterality Date  . Wisdom tooth extraction    . Cesarean section      C/S x 2  . Cervical cerclage N/A 10/09/2014    Procedure: CERCLAGE CERVICAL;  Surgeon: Osborn Coho, MD;  Location: [facility];  Service: Gynecology;  Laterality: N/A;    Social History   Social History  . Marital Status: Single    Spouse Name: N/A  . Number of Children: N/A  . Years of Education: N/A   Occupational History  . Not on file.   Social History Main Topics  . Smoking status: Current Every Day Smoker -- 0.25 packs/day for 13 years    Types: Cigarettes    Last Attempt to Quit: 08/14/2014  . Smokeless tobacco: Never Used  . Alcohol Use: No     Comment: socially and rarely  . Drug Use: No  . Sexual Activity:    Partners: Male    Birth Control/ Protection: None   Other Topics Concern  . Not on file   Social History Narrative    Current Outpatient Prescriptions on File Prior to Visit  Medication Sig Dispense Refill  . labetalol (NORMODYNE) 200 MG tablet Take 1 tablet (200 mg  total) by mouth 2 (two) times daily. 60 tablet 2  . Prenat w/o A Vit-FeFum-FePo-FA (CONCEPT OB) 130-92.4-1 MG CAPS Take 1 capsule by mouth daily. 30 capsule 5  . calcium carbonate (TUMS - DOSED IN MG ELEMENTAL CALCIUM) 500 MG chewable tablet Chew 2 tablets by mouth daily as needed for indigestion or heartburn.     No current facility-administered medications on file prior to visit.    No Known Allergies  Family History  Problem Relation Age of Onset  . Hypertension Mother   . Diabetes Father   . Stroke Father   . Cancer Maternal Uncle   . Cancer Maternal Grandmother     BP 142/62 mmHg  Pulse 93  Temp(Src) 98.1 F (36.7 C) (Oral)  Wt 319 lb (144.697 kg)  SpO2 95%  LMP 06/23/2014  Review of Systems denies blurry vision, headache, chest pain, sob, n/v, urinary frequency, muscle cramps, depression, cold intolerance, rhinorrhea, and easy bruising.  she has gained 20 lbs with this pregnancy .  She has intermittent excessive diaphoresis.     Objective:   Physical Exam VS: see vs page GEN: no distress.  Morbid obesity HEAD: head: no deformity eyes: no periorbital swelling, no proptosis external nose and ears are normal  mouth: no lesion seen NECK: supple, thyroid is not enlarged CHEST WALL: no deformity LUNGS:  Clear to auscultation CV: reg rate and rhythm, no murmur ABD: gravid; soft, nontender.  no hepatosplenomegaly.  not distended.  no hernia MUSCULOSKELETAL: muscle bulk and strength are grossly normal.  no obvious joint swelling.  gait is normal and steady EXTEMITIES: no deformity.  no ulcer on the feet.  feet are of normal color and temp.  1+ bilat leg edema. PULSES: dorsalis pedis intact bilat.  no carotid bruit.   NEURO:  cn 2-12 grossly intact.   readily moves all 4's.  sensation is intact to touch on the feet SKIN:  Normal texture and temperature.  No rash or suspicious lesion is visible.   NODES:  None palpable at the neck PSYCH: alert, well-oriented.  Does not  appear anxious nor depressed.    A1c=8.2%  i personally reviewed electrocardiogram tracing (06/09/13): a few inverted t-waves.  Radiol: fetal US: increase amniotic fluid    Assessment & Plan:  DM: severe exacerbation, considering her pregnancy.  HTN: pt will continue to follow up with OB.  Patient is advised the following: Patient Instructions  good diet and exercise significantly improve the control of your diabetes.  please let me know if you wish to be referred to a dietician.  high blood sugar is very risky  to your health.  you should see an eye doctor and dentist every year.  It is very important to get all recommended vaccinations.  controlling your blood pressure and cholesterol drastically reduces the damage diabetes does to your body.  Those who smoke should quit.  please discuss these  with your doctor.  check your blood sugar 4 times a day: before the 3 meals, and at bedtime.  also check if you have symptoms of your blood sugar being too high or too low.  please  keep a record of the readings and bring it to your next appointment here.  You can write it on any piece of paper.  please call us sooner if your blood sugar goes  below 70, or if you have a lot of readings over 200.   During pregnancy, we want to push the sugar lower than outside of pregnancy, and we need to get there faster.  i have sent a prescription to your pharmacy, to change to "70/30."  Here is a discount card. Please come back for a follow-up appointment in 1 week.

## 2015-01-20 ENCOUNTER — Ambulatory Visit: Payer: BLUE CROSS/BLUE SHIELD | Admitting: Endocrinology

## 2015-01-22 ENCOUNTER — Ambulatory Visit (HOSPITAL_COMMUNITY)
Admission: RE | Admit: 2015-01-22 | Discharge: 2015-01-22 | Disposition: A | Discharge status: Home / Self Care | Payer: BLUE CROSS/BLUE SHIELD | Source: Ambulatory Visit | Attending: Obstetrics and Gynecology | Admitting: Obstetrics and Gynecology

## 2015-01-22 ENCOUNTER — Encounter (HOSPITAL_COMMUNITY): Payer: Self-pay

## 2015-01-22 DIAGNOSIS — Z9641 Presence of insulin pump (external) (internal): Secondary | ICD-10-CM | POA: Diagnosis not present

## 2015-01-22 DIAGNOSIS — O3433 Maternal care for cervical incompetence, third trimester: Secondary | ICD-10-CM | POA: Diagnosis not present

## 2015-01-22 DIAGNOSIS — O10913 Unspecified pre-existing hypertension complicating pregnancy, third trimester: Secondary | ICD-10-CM | POA: Insufficient documentation

## 2015-01-22 DIAGNOSIS — Z3A28 28 weeks gestation of pregnancy: Secondary | ICD-10-CM | POA: Diagnosis not present

## 2015-01-22 DIAGNOSIS — O24113 Pre-existing diabetes mellitus, type 2, in pregnancy, third trimester: Secondary | ICD-10-CM | POA: Diagnosis not present

## 2015-01-22 DIAGNOSIS — F17210 Nicotine dependence, cigarettes, uncomplicated: Secondary | ICD-10-CM | POA: Insufficient documentation

## 2015-01-22 DIAGNOSIS — O99333 Smoking (tobacco) complicating pregnancy, third trimester: Secondary | ICD-10-CM | POA: Insufficient documentation

## 2015-01-22 DIAGNOSIS — O3421 Maternal care for scar from previous cesarean delivery: Secondary | ICD-10-CM | POA: Diagnosis not present

## 2015-01-22 DIAGNOSIS — E1165 Type 2 diabetes mellitus with hyperglycemia: Secondary | ICD-10-CM | POA: Diagnosis not present

## 2015-01-22 NOTE — Consult Note (Signed)
Maternal Fetal Medicine Consultation  Requesting Provider(s): Osborn Coho, MD  Primary OB: [facility]  Reason for consultation: History of previous low vertical C-section - recommendations on timing of delivery and use of betamethasone  HPI: Robyn [name] is a 34 year old [id number], EDD 04/16/2015 who is currently at 28w 0d - seen for consultation due to a previous history of a low vertical C-section.  Her OB history is as follows:  1) 1996 - 34 week delivery via low vertical C-section; PROM, chorioamnionitis 2) 1998 - TAB at 12 weeks 3) 2006 - PROM at 25 weeks, induction of labor, D&C 4) 2011 - Repeat C-section at 36 weeks. cerclage  Robyn [name] underwent a prophylactic cerclage this pregnancy and has done well.  She has a history of type 2 diabetes that has been difficult to control.  She was recently seen by endocrinology and started on 70/30 insulin BID.  She has a scheduled follow up with Endocrinology early next week for insulin adjustments.  The patient also has a history of chronic hypertension and is currently on Labetalol 200 mg BID.  She is without complaints today.  OB History: OB History    Gravida Para Term Preterm AB TAB SAB Ectopic Multiple Living   5 2  2 2 1 1   2       PMH:  Past Medical History  Diagnosis Date  . Hypertension   . Pregnancy induced hypertension   . Herpes   . Left ovarian cyst 11/29/2011  . Smoker 11/29/2011  . PCOS (polycystic ovarian syndrome)   . Shortness of breath dyspnea     with exertion  . Diabetes mellitus without complication     diet controlled     PSH:  Past Surgical History  Procedure Laterality Date  . Wisdom tooth extraction    . Cesarean section      C/S x 2  . Cervical cerclage N/A 10/09/2014    Procedure: CERCLAGE CERVICAL;  Surgeon: Osborn Coho, MD;  Location: [facility];  Service: Gynecology;  Laterality: N/A;   Meds:  Current Outpatient Prescriptions on File Prior to Encounter  Medication Sig Dispense Refill  . BD  INSULIN SYRINGE ULTRAFINE 31G X 5/16" 0.3 ML MISC 2 (two) times daily.  6  . calcium carbonate (TUMS - DOSED IN MG ELEMENTAL CALCIUM) 500 MG chewable tablet Chew 2 tablets by mouth daily as needed for indigestion or heartburn.    Marland Kitchen glucose blood (ONETOUCH VERIO) test strip 1 each by Other route 4 (four) times daily -  before meals and at bedtime. And lancets 4/day 120 each 5  . insulin aspart protamine - aspart (NOVOLOG 70/30 MIX) (70-30) 100 UNIT/ML FlexPen Inject 0.4 mLs (40 Units total) into the skin 2 (two) times daily. And pen needles 2/day 30 mL 11  . labetalol (NORMODYNE) 200 MG tablet Take 1 tablet (200 mg total) by mouth 2 (two) times daily. 60 tablet 2  . Prenat w/o A Vit-FeFum-FePo-FA (CONCEPT OB) 130-92.4-1 MG CAPS Take 1 capsule by mouth daily. 30 capsule 5   No current facility-administered medications on file prior to encounter.   Allergies: No Known Allergies   FH:  Family History  Problem Relation Age of Onset  . Hypertension Mother   . Diabetes Father   . Stroke Father   . Cancer Maternal Uncle   . Cancer Maternal Grandmother    Soc:  Social History   Social History  . Marital Status: Single    Spouse Name: N/A  .  Number of Children: N/A  . Years of Education: N/A   Occupational History  . Not on file.   Social History Main Topics  . Smoking status: Current Every Day Smoker -- 0.25 packs/day for 13 years    Types: Cigarettes    Last Attempt to Quit: 08/14/2014  . Smokeless tobacco: Never Used  . Alcohol Use: No     Comment: socially and rarely  . Drug Use: No  . Sexual Activity:    Partners: Male    Birth Control/ Protection: None   Other Topics Concern  . Not on file   Social History Narrative   PE:   Filed Vitals:   01/22/15 1304  BP: 139/70  Pulse: 84   A/P: 1) Single IUP at 28w 0d  2) Hx of cervical insufficiency s/p cerclage  3) Chronic hypertension on Labetalol 200 mg BID  4) Type 2 diabetes - HbA1C in April was 6.5.  The patient  reports that she has become increasingly difficult to manage her blood sugars and was recently sent to Endocrinology for further evaluation and treatment.  She was recently placed on 70/30 insulin and has follow up with Endocrinology next week.  I will defer management of her diabetes to them. If unable to get the patient is reasonable control, would entertain a brief hospitalization for diabetic patterning.  Please let us know if we can be of further assistance in the is area.  5) Advanced maternal age - low risk NIPS  6) History of previous low vertical C-section and Repeat C-section x 1  - With this history, the patient is not a candidate for vaginal delivery, and would recommend a repeat C-section prior to the onset of labor.  In our group, we generally offer scheduled C-section at 36 weeks - although I am not sure that there is any significant benefit for offering a scheduled C-section at 36 as opposed to 37 weeks.  I explained this to the patient, and she would prefer to proceed at 36 weeks which is very reasonable given her history.  I would offer a course of late preterm betamethasone some 48 hours prior to her scheduled C-section.  I feel that this can be offered as an outpatient, but may result in a temporary worsening of her blood sugars - some patients may even require an insulin drip as an inpatient.  Would recommend very close follow up if this is done as an outpatient - consider inpatient management if blood sugars acutely worsen (BS >200-250 range).  If the patient is stable, would remove the cerclage immediately following her C-section while still under anesthesia.   Thank you for the opportunity to be a part of the care of Robyn [name]. Please contact our office if we can be of further assistance.   I spent approximately 30 minutes with this patient with over 50% of time spent in face-to-face counseling.  Alpha Gula, MD Maternal Fetal Medicine

## 2015-01-25 ENCOUNTER — Ambulatory Visit (INDEPENDENT_AMBULATORY_CARE_PROVIDER_SITE_OTHER): Payer: BLUE CROSS/BLUE SHIELD | Admitting: Endocrinology

## 2015-01-25 ENCOUNTER — Encounter: Payer: Self-pay | Admitting: Endocrinology

## 2015-01-25 VITALS — BP 138/80 | HR 83 | Temp 98.3°F | Ht 67.0 in | Wt 322.0 lb

## 2015-01-25 DIAGNOSIS — E1165 Type 2 diabetes mellitus with hyperglycemia: Secondary | ICD-10-CM | POA: Diagnosis not present

## 2015-01-25 DIAGNOSIS — IMO0001 Reserved for inherently not codable concepts without codable children: Secondary | ICD-10-CM

## 2015-01-25 MED ORDER — INSULIN ASPART PROT & ASPART (70-30 MIX) 100 UNIT/ML PEN: PEN_INJECTOR | SUBCUTANEOUS | Status: DC

## 2015-01-25 NOTE — Patient Instructions (Addendum)
check your blood sugar 4 times a day: before the 3 meals, and at bedtime.  also check if you have symptoms of your blood sugar being too high or too low.  please  keep a record of the readings and bring it to your next appointment here.  You can write it on any piece of paper.  please call us sooner if your blood sugar goes  below 70, or if you have a lot of readings over 200.   During pregnancy, we want to push the sugar lower than outside of pregnancy, and we need to get there faster.  Please increase the insulin to 60 units with breakfast, and 40 units with the evening meal.   Please come back for a follow-up appointment in 4 days.

## 2015-01-25 NOTE — Progress Notes (Signed)
Subjective:    Patient ID: Robyn [name], female    DOB: 1980-08-18, 34 y.o.   MRN: [id number]  HPI Pt returns for f/u of diabetes mellitus: DM type: Insulin-requiring type 2 Dx'ed: 2011, during a pregnancy, but it persisted afterward Complications: none Therapy: back on insulin since mid-2016 pregnancy DKA: never Severe hypoglycemia: never Pancreatitis: never Other: she is on a simple BID insulin regimen. Interval history:  She is now at 29 weeks.  pt states she feels well in general.  She says she takes the insulin as rx'ed, but is overdue for f/u, as has not called as advised for high cbg's.  Past Medical History  Diagnosis Date  . Hypertension   . Pregnancy induced hypertension   . Herpes   . Left ovarian cyst 11/29/2011  . Smoker 11/29/2011  . PCOS (polycystic ovarian syndrome)   . Shortness of breath dyspnea     with exertion  . Diabetes mellitus without complication     diet controlled     Past Surgical History  Procedure Laterality Date  . Wisdom tooth extraction    . Cesarean section      C/S x 2  . Cervical cerclage N/A 10/09/2014    Procedure: CERCLAGE CERVICAL;  Surgeon: Osborn Coho, MD;  Location: [facility];  Service: Gynecology;  Laterality: N/A;    Social History   Social History  . Marital Status: Single    Spouse Name: N/A  . Number of Children: N/A  . Years of Education: N/A   Occupational History  . Not on file.   Social History Main Topics  . Smoking status: Current Every Day Smoker -- 0.25 packs/day for 13 years    Types: Cigarettes    Last Attempt to Quit: 08/14/2014  . Smokeless tobacco: Never Used  . Alcohol Use: No     Comment: socially and rarely  . Drug Use: No  . Sexual Activity:    Partners: Male    Birth Control/ Protection: None   Other Topics Concern  . Not on file   Social History Narrative    Current Outpatient Prescriptions on File Prior to Visit  Medication Sig Dispense Refill  . BD INSULIN SYRINGE ULTRAFINE 31G X  5/16" 0.3 ML MISC 2 (two) times daily.  6  . glucose blood (ONETOUCH VERIO) test strip 1 each by Other route 4 (four) times daily -  before meals and at bedtime. And lancets 4/day 120 each 5  . labetalol (NORMODYNE) 200 MG tablet Take 1 tablet (200 mg total) by mouth 2 (two) times daily. 60 tablet 2  . Prenat w/o A Vit-FeFum-FePo-FA (CONCEPT OB) 130-92.4-1 MG CAPS Take 1 capsule by mouth daily. 30 capsule 5   No current facility-administered medications on file prior to visit.   No Known Allergies  Family History  Problem Relation Age of Onset  . Hypertension Mother   . Diabetes Father   . Stroke Father   . Cancer Maternal Uncle   . Cancer Maternal Grandmother     BP 138/80 mmHg  Pulse 83  Temp(Src) 98.3 F (36.8 C) (Oral)  Ht  (1.702 m)  Wt 322 lb (146.058 kg)  BMI 50.42 kg/m2  SpO2 95%  LMP 06/23/2014  Review of Systems She denies hypoglycemia    Objective:   Physical Exam VITAL SIGNS:  See vs page GENERAL: no distress Pulses: dorsalis pedis intact bilat.   MSK: no deformity of the feet CV: 1+ bilat leg edema Skin:  no ulcer  on the feet.  normal color and temp on the feet. Neuro: sensation is intact to touch on the feet   she brings meter today, which is downloaded, and scanned into the record.  cbg varies from 120-247.  It is in general higher as the day goes on  Lab Results  Component Value Date   HGBA1C 8.2 01/13/2015      Assessment & Plan:  DM: she needs increased rx.  Pt declines pump rx.  i don't think this is right for her either, as she needs to concentrate on taking enough insulin, and calling if cbg's are high  Patient is advised the following: Patient Instructions  check your blood sugar 4 times a day: before the 3 meals, and at bedtime.  also check if you have symptoms of your blood sugar being too high or too low.  please  keep a record of the readings and bring it to your next appointment here.  You can write it on any piece of paper.   please call us sooner if your blood sugar goes  below 70, or if you have a lot of readings over 200.   During pregnancy, we want to push the sugar lower than outside of pregnancy, and we need to get there faster.  Please increase the insulin to 60 units with breakfast, and 40 units with the evening meal.   Please come back for a follow-up appointment in 4 days.

## 2015-01-29 ENCOUNTER — Encounter (HOSPITAL_COMMUNITY): Payer: Self-pay | Admitting: Obstetrics and Gynecology

## 2015-02-15 ENCOUNTER — Other Ambulatory Visit: Payer: Self-pay | Admitting: Obstetrics and Gynecology

## 2015-02-26 ENCOUNTER — Telehealth: Payer: Self-pay | Admitting: Endocrinology

## 2015-02-26 NOTE — Telephone Encounter (Signed)
I contacted the pt. Pt scheduled for 10/3 at 9:00 am.

## 2015-02-26 NOTE — Telephone Encounter (Signed)
please call patient: Ov mon or tues please

## 2015-03-01 ENCOUNTER — Ambulatory Visit (INDEPENDENT_AMBULATORY_CARE_PROVIDER_SITE_OTHER): Payer: BLUE CROSS/BLUE SHIELD | Admitting: Endocrinology

## 2015-03-01 ENCOUNTER — Encounter: Payer: Self-pay | Admitting: Endocrinology

## 2015-03-01 VITALS — BP 144/88 | HR 87 | Temp 97.0°F | Ht 67.0 in | Wt 319.0 lb

## 2015-03-01 DIAGNOSIS — E1165 Type 2 diabetes mellitus with hyperglycemia: Secondary | ICD-10-CM | POA: Diagnosis not present

## 2015-03-01 DIAGNOSIS — IMO0001 Reserved for inherently not codable concepts without codable children: Secondary | ICD-10-CM

## 2015-03-01 LAB — GLUCOSE, POCT (MANUAL RESULT ENTRY): POC Glucose: 186 mg/dl — AB (ref 70–99)

## 2015-03-01 MED ORDER — INSULIN ASPART PROT & ASPART (70-30 MIX) 100 UNIT/ML PEN: PEN_INJECTOR | SUBCUTANEOUS | Status: DC

## 2015-03-01 NOTE — Patient Instructions (Addendum)
Please check your blood sugar 4 times a day: before the 3 meals, and at bedtime.  also check if you have symptoms of your blood sugar being too high or too low.  Please keep a record of the readings and bring it to your next appointment here.  You can write it on any piece of paper.  please call us sooner if your blood sugar goes below 70, or if you have a lot of readings over 200.   During pregnancy, we want to push the sugar lower than outside of pregnancy, and we need to get there faster.  Please increase the insulin to 70 units with breakfast, and 50 units with the evening meal.   Please come back for a follow-up appointment in 4 days.   Please continue the same blood pressure medication for now, but please see Dr Su Hilt in a few days to recheck it.  You may need to have the medication increased.

## 2015-03-01 NOTE — Progress Notes (Signed)
Subjective:    Patient ID: Robyn [name], female    DOB: 1981-02-08, 34 y.o.   MRN: [id number]  HPI Pt returns for f/u of diabetes mellitus: DM type: Insulin-requiring type 2 Dx'ed: 2011, during a pregnancy, but it persisted afterward Complications: none Therapy: back on insulin since mid-2016 pregnancy DKA: never Severe hypoglycemia: never Pancreatitis: never Other: she is on a BID insulin regimen. She has declined pump rx.  Interval history:  She is now at 34 weeks.  pt states she feels well in general.  She says she takes the insulin as rx'ed, but is again overdue for f/u, as has not called as advised for high cbg's.  Pt forgot her meter today, but says cbg's are in the mid-100's.  There is no trend throughout the day.  Past Medical History  Diagnosis Date  . Hypertension   . Pregnancy induced hypertension   . Herpes   . Left ovarian cyst 11/29/2011  . Smoker 11/29/2011  . PCOS (polycystic ovarian syndrome)   . Shortness of breath dyspnea     with exertion  . Diabetes mellitus without complication (HCC)     diet controlled     Past Surgical History  Procedure Laterality Date  . Wisdom tooth extraction    . Cesarean section      C/S x 2  . Cervical cerclage N/A 10/09/2014    Procedure: CERCLAGE CERVICAL;  Surgeon: Osborn Coho, MD;  Location: [facility];  Service: Gynecology;  Laterality: N/A;    Social History   Social History  . Marital Status: Single    Spouse Name: N/A  . Number of Children: N/A  . Years of Education: N/A   Occupational History  . Not on file.   Social History Main Topics  . Smoking status: Current Every Day Smoker -- 0.25 packs/day for 13 years    Types: Cigarettes    Last Attempt to Quit: 08/14/2014  . Smokeless tobacco: Never Used  . Alcohol Use: No     Comment: socially and rarely  . Drug Use: No  . Sexual Activity:    Partners: Male    Birth Control/ Protection: None   Other Topics Concern  . Not on file   Social History  Narrative    Current Outpatient Prescriptions on File Prior to Visit  Medication Sig Dispense Refill  . BD INSULIN SYRINGE ULTRAFINE 31G X 5/16" 0.3 ML MISC 2 (two) times daily.  6  . glucose blood (ONETOUCH VERIO) test strip 1 each by Other route 4 (four) times daily -  before meals and at bedtime. And lancets 4/day 120 each 5  . labetalol (NORMODYNE) 200 MG tablet Take 1 tablet (200 mg total) by mouth 2 (two) times daily. 60 tablet 2  . Prenat w/o A Vit-FeFum-FePo-FA (CONCEPT OB) 130-92.4-1 MG CAPS Take 1 capsule by mouth daily. 30 capsule 5   No current facility-administered medications on file prior to visit.    No Known Allergies  Family History  Problem Relation Age of Onset  . Hypertension Mother   . Diabetes Father   . Stroke Father   . Cancer Maternal Uncle   . Cancer Maternal Grandmother     BP 144/88 mmHg  Pulse 87  Temp(Src) 97 F (36.1 C) (Oral)  Ht  (1.702 m)  Wt 319 lb (144.697 kg)  BMI 49.95 kg/m2  SpO2 97%  LMP 06/23/2014  Review of Systems She denies hypoglycemia.      Objective:   Physical Exam  VITAL SIGNS:  See vs page GENERAL: no distress Pulses: dorsalis pedis intact bilat.   MSK: no deformity of the feet CV: no leg edema Skin:  no ulcer on the feet.  normal color and temp on the feet. Neuro: sensation is intact to touch on the feet     Assessment & Plan:  DM: she needs increased rx. Pregnancy: I told pt in this context, we have more aggressive goals of glycemic control than outside of pregnancy HTN: slightly worse.  Patient is advised the following: Patient Instructions  Please check your blood sugar 4 times a day: before the 3 meals, and at bedtime.  also check if you have symptoms of your blood sugar being too high or too low.  Please keep a record of the readings and bring it to your next appointment here.  You can write it on any piece of paper.  please call us sooner if your blood sugar goes below 70, or if you have a lot of  readings over 200.   During pregnancy, we want to push the sugar lower than outside of pregnancy, and we need to get there faster.  Please increase the insulin to 70 units with breakfast, and 50 units with the evening meal.   Please come back for a follow-up appointment in 4 days.   Please continue the same blood pressure medication for now, but please see Dr Su Hilt in a few days to recheck it.  You may need to have the medication increased.

## 2015-03-03 ENCOUNTER — Telehealth: Payer: Self-pay | Admitting: Endocrinology

## 2015-03-03 NOTE — Telephone Encounter (Signed)
Robyn [name] from [facility] called stating that they have received the wrong information needing to be sent to them   Please advise   Call back: [phone]

## 2015-03-03 NOTE — Telephone Encounter (Signed)
[phone] °

## 2015-03-03 NOTE — Telephone Encounter (Signed)
Requested call back from [facility] to discuss.

## 2015-03-03 NOTE — Telephone Encounter (Signed)
Adrienne calling back for insulin instructions

## 2015-03-04 NOTE — Telephone Encounter (Signed)
I contacted [facility]. Adrieene stated the day of the pt's C-section she will have to fast 8 hours before. Patient is scheduled to have the C-Section done at 2:30. Adrieene needs a written order on our letter head what the patient insulin instructions will be leading up to the C-Section and after. I advised the Adrieene the patient had a office visit tomorrow.

## 2015-03-05 ENCOUNTER — Ambulatory Visit: Payer: BLUE CROSS/BLUE SHIELD | Admitting: Endocrinology

## 2015-03-05 DIAGNOSIS — Z0289 Encounter for other administrative examinations: Secondary | ICD-10-CM

## 2015-03-08 ENCOUNTER — Telehealth: Payer: Self-pay | Admitting: Endocrinology

## 2015-03-08 NOTE — Telephone Encounter (Signed)
Left voicemail advising the pt to call our office and schedule a office visit.

## 2015-03-08 NOTE — Telephone Encounter (Signed)
please call patient: Ov is needed this week

## 2015-03-08 NOTE — Telephone Encounter (Signed)
Left voicemail advising the patient to call back and schedule appointment.

## 2015-03-09 NOTE — Telephone Encounter (Signed)
Noted, thanks!

## 2015-03-09 NOTE — Telephone Encounter (Signed)
Patient rescheduled for 8 am on 10/12.

## 2015-03-09 NOTE — Telephone Encounter (Signed)
Robyn [name] from [facility] informed Robyn [name] that the pt did not arrive her her appt last Friday and that she rescheduled for tomorrow

## 2015-03-10 ENCOUNTER — Encounter: Payer: Self-pay | Admitting: Endocrinology

## 2015-03-10 ENCOUNTER — Ambulatory Visit (INDEPENDENT_AMBULATORY_CARE_PROVIDER_SITE_OTHER): Payer: BLUE CROSS/BLUE SHIELD | Admitting: Endocrinology

## 2015-03-10 VITALS — BP 130/74 | HR 99 | Temp 97.9°F | Ht 67.0 in | Wt 319.0 lb

## 2015-03-10 DIAGNOSIS — IMO0001 Reserved for inherently not codable concepts without codable children: Secondary | ICD-10-CM

## 2015-03-10 DIAGNOSIS — E1165 Type 2 diabetes mellitus with hyperglycemia: Secondary | ICD-10-CM

## 2015-03-10 LAB — POCT GLYCOSYLATED HEMOGLOBIN (HGB A1C): HEMOGLOBIN A1C: 8.3

## 2015-03-10 MED ORDER — INSULIN ASPART PROT & ASPART (70-30 MIX) 100 UNIT/ML PEN: PEN_INJECTOR | SUBCUTANEOUS | Status: DC

## 2015-03-10 NOTE — Progress Notes (Signed)
Subjective:    Patient ID: Robyn [name], female    DOB: [birth year], 34 y.o.   MRN: [id number]  HPI Pt returns for f/u of diabetes mellitus: DM type: Insulin-requiring type 2 Dx'ed: 2011, during a pregnancy, but it persisted afterward Complications: none Therapy: back on insulin since mid-2016 pregnancy DKA: never Severe hypoglycemia: never Pancreatitis: never Other: she is on a BID insulin regimen. She has declined pump rx.  Interval history:  She is now at 35 weeks.  no cbg record, but states cbg's vary from 109-180.  There is no trend throughout the day.  She will have scheduled C-section on 03/23/15.  She will receive steroids on 03/21/15.  pt states she feels well in general.  Pt says she never misses the insulin.   Past Medical History  Diagnosis Date  . Hypertension   . Pregnancy induced hypertension   . Herpes   . Left ovarian cyst 11/29/2011  . Smoker 11/29/2011  . PCOS (polycystic ovarian syndrome)   . Shortness of breath dyspnea     with exertion  . Diabetes mellitus without complication (HCC)     diet controlled     Past Surgical History  Procedure Laterality Date  . Wisdom tooth extraction    . Cesarean section      C/S x 2  . Cervical cerclage N/A 10/09/2014    Procedure: CERCLAGE CERVICAL;  Surgeon: Osborn Coho, MD;  Location: [facility];  Service: Gynecology;  Laterality: N/A;    Social History   Social History  . Marital Status: Single    Spouse Name: N/A  . Number of Children: N/A  . Years of Education: N/A   Occupational History  . Not on file.   Social History Main Topics  . Smoking status: Current Every Day Smoker -- 0.25 packs/day for 13 years    Types: Cigarettes    Last Attempt to Quit: 08/14/2014  . Smokeless tobacco: Never Used  . Alcohol Use: No     Comment: socially and rarely  . Drug Use: No  . Sexual Activity:    Partners: Male    Birth Control/ Protection: None   Other Topics Concern  . Not on file   Social History  Narrative    Current Outpatient Prescriptions on File Prior to Visit  Medication Sig Dispense Refill  . BD INSULIN SYRINGE ULTRAFINE 31G X 5/16" 0.3 ML MISC 2 (two) times daily.  6  . glucose blood (ONETOUCH VERIO) test strip 1 each by Other route 4 (four) times daily -  before meals and at bedtime. And lancets 4/day 120 each 5  . labetalol (NORMODYNE) 200 MG tablet Take 1 tablet (200 mg total) by mouth 2 (two) times daily. 60 tablet 2  . Prenat w/o A Vit-FeFum-FePo-FA (CONCEPT OB) 130-92.4-1 MG CAPS Take 1 capsule by mouth daily. 30 capsule 5   No current facility-administered medications on file prior to visit.    No Known Allergies  Family History  Problem Relation Age of Onset  . Hypertension Mother   . Diabetes Father   . Stroke Father   . Cancer Maternal Uncle   . Cancer Maternal Grandmother     BP 130/74 mmHg  Pulse 99  Temp(Src) 97.9 F (36.6 C) (Oral)  Ht  (1.702 m)  Wt 319 lb (144.697 kg)  BMI 49.95 kg/m2  SpO2 95%  LMP 06/23/2014  Review of Systems She denies hypoglycemia    Objective:   Physical Exam VITAL SIGNS:  See vs  page GENERAL: no distress Pulses: dorsalis pedis intact bilat.   MSK: no deformity of the feet CV: no leg edema Skin:  no ulcer on the feet.  normal color and temp on the feet. Neuro: sensation is intact to touch on the feet  Lab Results  Component Value Date   HGBA1C 8.3 03/10/2015      Assessment & Plan:  DM: she needs increased rx  Patient is advised the following: Patient Instructions  Please check your blood sugar 4 times a day: before the 3 meals, and at bedtime.  also check if you have symptoms of your blood sugar being too high or too low.  Please keep a record of the readings and bring it to your next appointment here.  You can write it on any piece of paper.  please call us sooner if your blood sugar goes below 70, or if you have a lot of readings over 200.   During pregnancy, we want to push the sugar lower than  outside of pregnancy, and we need to get there faster.  Please increase the insulin to 90 units with breakfast, and 70 units with the evening meal.   Please come back for a follow-up appointment in 1 week.

## 2015-03-10 NOTE — Patient Instructions (Addendum)
Please check your blood sugar 4 times a day: before the 3 meals, and at bedtime.  also check if you have symptoms of your blood sugar being too high or too low.  Please keep a record of the readings and bring it to your next appointment here.  You can write it on any piece of paper.  please call us sooner if your blood sugar goes below 70, or if you have a lot of readings over 200.   During pregnancy, we want to push the sugar lower than outside of pregnancy, and we need to get there faster.  Please increase the insulin to 90 units with breakfast, and 70 units with the evening meal.   Please come back for a follow-up appointment in 1 week.

## 2015-03-12 ENCOUNTER — Encounter: Payer: Self-pay | Admitting: Endocrinology

## 2015-03-19 ENCOUNTER — Encounter (HOSPITAL_COMMUNITY): Payer: Self-pay

## 2015-03-20 ENCOUNTER — Inpatient Hospital Stay (HOSPITAL_COMMUNITY)
Admission: AD | Admit: 2015-03-20 | Discharge: 2015-03-20 | Disposition: A | Discharge status: Home / Self Care | Payer: Medicaid Other | Source: Ambulatory Visit | Attending: Obstetrics and Gynecology | Admitting: Obstetrics and Gynecology

## 2015-03-20 ENCOUNTER — Other Ambulatory Visit: Payer: Self-pay | Admitting: Obstetrics and Gynecology

## 2015-03-20 MED ORDER — BETAMETHASONE SOD PHOS & ACET 6 (3-3) MG/ML IJ SUSP
12.5000 mg | Freq: Once | INTRAMUSCULAR | Status: DC
  Filled 2015-03-20: qty 2.1

## 2015-03-20 MED ORDER — BETAMETHASONE SOD PHOS & ACET 6 (3-3) MG/ML IJ SUSP
12.0000 mg | Freq: Once | INTRAMUSCULAR | Status: AC
  Administered 2015-03-20: 12 mg via INTRAMUSCULAR
  Filled 2015-03-20: qty 2

## 2015-03-20 NOTE — MAU Note (Signed)
Scheduled for c/s on Tues;  Here for betamethasone injection.

## 2015-03-21 ENCOUNTER — Inpatient Hospital Stay (HOSPITAL_COMMUNITY)
Admission: AD | Admit: 2015-03-21 | Discharge: 2015-03-21 | Disposition: A | Discharge status: Home / Self Care | Payer: Medicaid Other | Source: Ambulatory Visit | Attending: Obstetrics and Gynecology | Admitting: Obstetrics and Gynecology

## 2015-03-21 MED ORDER — BETAMETHASONE SOD PHOS & ACET 6 (3-3) MG/ML IJ SUSP
12.0000 mg | Freq: Once | INTRAMUSCULAR | Status: AC
  Administered 2015-03-21: 12 mg via INTRAMUSCULAR
  Filled 2015-03-21: qty 2

## 2015-03-21 NOTE — H&P (Signed)
Robyn [name] is a 34 y.o. female, [phone] at [redacted]w[redacted]d weeks, presenting for removal cerclage, scheduled cesarean section and sterilization,   S/P Betamethasone 03/20/15 & 03/21/15.   Currently under the care of endocrinologist for Type 2 diabetes mellitus.  Chronic hypertension, and Hx Cesarean x 2, initial Emergent C/S low vertical incision which was closed in 3 layers in 1996 at 34wks  . Patient Active Problem List   Diagnosis Date Noted  . Hypertension - on Labetalol 200 mg po bid 11/22/2014  . Diabetes mellitus type 2, uncontrolled, without complications -- dx'd in 2011 11/22/2014  . Hx of incompetent cervix, currently pregnant - cerclage placed on 10/09/14 by Dr. Su Hilt 11/22/2014  . History of preterm delivery x 2 - weekly 17P injections - first injection at 16.5 wks 11/22/2014  . Elderly multigravida 11/22/2014  . PCOS (polycystic ovarian syndrome) 08/21/2014  . Herpes 08/21/2014  . ANA positive 08/21/2014  . Previous cesarean delivery, antepartum condition or complication x 2--previous vertical incision 08/21/2014  . Obesity 05/20/2012    History of present pregnancy: Patient entered care at [redacted]w[redacted]d.   EDC of 04/15/15 was established by early [place],  [redacted]w[redacted]d.   Anatomy scan 11/17/14: [redacted]w[redacted]d  weeks, with normal findings, some anatomy not seen and a posterior placenta.   EFW 0 oz.= 59%tile. 156 bpm. Transverse pres. Head Right. Mason Jim. Posterior placenta. No previa.  Placental edge to cx= 4.0 cm. PCI seen. WNL fluid. P pocket= 4.8 cm. Meas: concordant with established  GA. Anat: Open hands, 5th digit seen, NB seen. Female gender.  Anatomy not seen: RVOT, LVOT, DA, AA  . Need better kidnet views.- technically difficult scan due to Maternal obesity. Cx closed. Cerclage  visualized, measured transvaginally= 4.66 cm.  Additional [place] evaluations:  [redacted]w[redacted]d - 08/20/14- Viability-   Singleton intra-uterine gestational sac with yolk sac, no fetal pole is visualized.  [redacted]w[redacted]d -  08/31/14 - [facility]/Spotting :  anteverted uterus, singleton IUP +FHTs, CRL= GA of [redacted]w[redacted]d, EDD= 04/16/15;  Small  SCH on the left- measures 2.2cm x 0.87cm x 1.4cm; RVOT- WNLs, right adnexa, LTOV/adnexa-  solid mass seen- degenerating suberosal fibroid vs ovarian mass. No color flow seen. Measures 5.9cmX  6.8cmX5.9cm. Study is technically difficult to see due to obesity. No free fluid.  [redacted]w[redacted]d  -09/28/14-  [facility]- Trimester 1  : OB <14  eval LTOV. FHT's 162 bpm. Singleton IUP. GA [redacted]w[redacted]d. CRL=  4.95 cm. It is c/w established GA .  Anteverted Ut. Normal fluid. RTOV- unremarkable. LTOV- complex  cyst. No nodularity. No enhanced color flow.. Meas: 3.4cmx2.4cmx3.4cm. Previously, it was unsure if this  was a fibroid or a cyst. It appears to be an ovarian cyst. Adnexas WNL. No Free fluid. [redacted]w[redacted]d - 12/15/14 - F/U anatomy- Vertex, posterior placenta, fluid- wnls; AP pocket=7.3cm, appropriate interval  growth, technically difficult and limited study due to obesity. Anatomy again not seen RVOT, LOT, AA, DA,  and fetal kidneys.  Cx closed, measured translabially. Cerclage visualized 24 weeks -12/30/14- [facility] OB US detail : FHR 141 Cx length= 3.3 cm. Measured TV. Cerclage visualized;  appears closed, without funneling. Ovaries/adnexas WNL. Single IUP @ [redacted]w[redacted]d. Somewhat limited views  of the fetal heart were obtained. RVOT, DA, AA. Remainder of fetal anatomy appears WNL. EFW @  78%tile. AFI is subjectively increased (max VP: 9.5 cm). TV U/S- Cx length= 3.3 cm Cerclage appears  intact. Fetal echo due to Type 2 DM . Recommend serial U/S's q 4 weeks for growth due to DM and HTN  Please  let us know if you would like these U/S's to be done at [facility]. Antenatal testing starting no later than  32 wks. Gestation. [redacted]w[redacted]d - 02/05/15 -  OB US follow up: EFW= 4 lbs 6 ox= 98 %tile. Position: Cephalic. Spine maternal L. Posterior  placenta. AC meas: Large for dates. AFI= 16.9cm= 75th % tile. Cx seen TV; meas: 2.1-2.4 cm Minimal  funneling seen at internal cervix. 02/12/15- Short  cervix -  Posterior cephalic, cervix measures 2.1-2.2 cm, fundal pressure applied. Minimal  funneling seen at internal cervical os. No previa seen.  32 w0d - 02/18/15- BPP : 8/8 in 20 min, AFI: 12.5, 35%; placental fundal/posterior; position maternal spine right, cervix 2.3 cm, slight funneling seen at the internal cervical os. Cerclage noted.   [redacted]w[redacted]d- 02/24/15 - BPP- Cephalic maternal right, AFI: 08.6 cm, 25%, BOO 6/8, no breathing movements seen, cervix measures 2.3-2.6cm. Slight funneling at internal cervical os. Cerclage noted.  [redacted]W[redacted]D -02/26/15 - BPP 8/8. Single. Vertex. Posterior placenta. AFI normal 65th%. Cx measured TV - mild funneling. Cerclage seen  [redacted]w[redacted]d -03/05/15- BPP-  8/8. Vertex presentation, AFI 65%  [redacted]w[redacted]d- 03/11/15- BPP-   8/8 . Vertex, AFI 75% [redacted]w[redacted]d- 03/18/15- BPP-   8/8  Vertex, AFi 55% Significant prenatal events: Cerclage placement 10/09/14,  Fetal Echo-0902/16- fetal heart appears normal, Last evaluation:  03/18/15  OB History    Gravida Para Term Preterm AB TAB SAB Ectopic Multiple Living   Past Medical History  Diagnosis Date  . Hypertension   . Pregnancy induced hypertension   . Herpes   . Left ovarian cyst 11/29/2011  . Smoker 11/29/2011  . PCOS (polycystic ovarian syndrome)   . Shortness of breath dyspnea     with exertion  . Diabetes mellitus without complication (HCC)     diet controlled    Past Surgical History  Procedure Laterality Date  . Wisdom tooth extraction    . Cesarean section      C/S x 2  . Cervical cerclage N/A 10/09/2014    Procedure: CERCLAGE CERVICAL;  Surgeon: Osborn Coho, MD;  Location: [facility];  Service: Gynecology;  Laterality: N/A;   Family History: family history includes Cancer in her maternal grandmother and maternal uncle; Diabetes in her father; Hypertension in her mother; Stroke in her father. Social History:  reports that she has been smoking Cigarettes.  She has a 3.25 pack-year smoking history. She has never  used smokeless tobacco. She reports that she does not drink alcohol or use illicit drugs.  African American,  [employer]  Maternal Diabetes: Yes:  Diabetes Type:  Insulin/Medication controlled Genetic Screening: Normal AFP maternal screen Maternal Ultrasounds/Referrals: Normal Fetal Ultrasounds or other Referrals:  Fetal echo, Referred to Materal Fetal Medicine  Maternal Substance Abuse:  No Significant Maternal Medications:  Meds include: Other:  labetalol 200 mg twice per day, Novolin N 100 unit/ml, Novolog Mix 70-30 FlexPen 100unit/nl subcutaneous pen Significant Maternal Lab Results: Lab values include: Group B Strep positive  TDAP 01/21/15  Flu no record  ROS:  See above  No Known Allergies     Last menstrual period 06/23/2014.  Chest clear Heart RRR without murmur Abd gravid, NT, FH 38 cm on 03/18/15 Pelvic: deferred Ext: wnl  FHR: 133 bpm on 03/18/15 UCs:  none  Prenatal labs: ABO, Rh: --/--/O POS (03/15 2023) Antibody: Negative (07/05 0000) Rubella:    drawn pre-op results  pending RPR:    Drawn pre-op results pending HBsAg:   drawn pre-op results pending HIV: Non-reactive (03/15 0000)  GBS:   postive Sickle cell/Hgb electrophoresis:   Pap:   06/04/12 Wnl GC:  negative Chlamydia:  negative Genetic screenings: normal Panorama, AFP negative,  Glucola:  Previous diabetic type 2 Other:   Hgb 12.0 at NOB,  at 28 weeks Lupus anticoagulant planel : normal    Assessment/Plan: IUP at [redacted]W[redacted]d Type 2 diabetes mellitus- Sq insulin Morbid obesity Smoker Hx Cervical incompetence - onset 10/02/14- Cerclage intact Chronic Hypertension- labetalol 200 mg po bid Hx Cesarean deliveries x2     Plan: Admit to Birthing Suite per consult with Dr. Su Hiltoberts Routine [facility] orders Removal cerclage, scheduled cesarean section and sterilization   Beatrix Fettersachel StallCNM, [place] 03/21/2015, 2:53 PM

## 2015-03-22 ENCOUNTER — Encounter (HOSPITAL_COMMUNITY): Payer: Self-pay

## 2015-03-22 ENCOUNTER — Encounter (HOSPITAL_COMMUNITY)
Admission: RE | Admit: 2015-03-22 | Discharge: 2015-03-22 | Disposition: A | Discharge status: Home / Self Care | Payer: Medicaid Other | Source: Ambulatory Visit | Attending: Obstetrics and Gynecology | Admitting: Obstetrics and Gynecology

## 2015-03-22 HISTORY — DX: Anxiety disorder, unspecified: F41.9

## 2015-03-22 LAB — CBC
HEMATOCRIT: 36.6 % (ref 36.0–46.0)
Hemoglobin: 12.1 g/dL (ref 12.0–15.0)
MCH: 27.6 pg (ref 26.0–34.0)
MCHC: 33.1 g/dL (ref 30.0–36.0)
MCV: 83.4 fL (ref 78.0–100.0)
Platelets: 242 10*3/uL (ref 150–400)
RBC: 4.39 MIL/uL (ref 3.87–5.11)
RDW: 15.8 % — ABNORMAL HIGH (ref 11.5–15.5)
WBC: 12.2 10*3/uL — ABNORMAL HIGH (ref 4.0–10.5)

## 2015-03-22 LAB — BASIC METABOLIC PANEL
Anion gap: 5 (ref 5–15)
BUN: 8 mg/dL (ref 6–20)
CHLORIDE: 106 mmol/L (ref 101–111)
CO2: 21 mmol/L — ABNORMAL LOW (ref 22–32)
CREATININE: 0.45 mg/dL (ref 0.44–1.00)
Calcium: 9.1 mg/dL (ref 8.9–10.3)
GFR calc Af Amer: >60 mL/min (ref >60)
GFR calc non Af Amer: >60 mL/min (ref >60)
GLUCOSE: 235 mg/dL — AB (ref 65–99)
POTASSIUM: 3.7 mmol/L (ref 3.5–5.1)
SODIUM: 132 mmol/L — AB (ref 135–145)

## 2015-03-22 MED ORDER — DEXTROSE 5 % IV SOLN
3.0000 g | INTRAVENOUS | Status: AC
  Administered 2015-03-23: 3 g via INTRAVENOUS
  Filled 2015-03-22: qty 3000

## 2015-03-22 NOTE — Patient Instructions (Signed)
Your procedure is scheduled on:03/23/15  Enter through the Main Entrance at :1pm Pick up desk phone and dial [phone] and inform us of your arrival.  Please call [phone] if you have any problems the morning of surgery.  Remember: Do not eat food after midnight:tonight Clear liquids are ok until:10 am on Tuesday   You may brush your teeth the morning of surgery.  Take these meds the morning of surgery with a sip of water:Labetalol  DO NOT wear jewelry, eye make-up, lipstick,body lotion, or dark fingernail polish.  (Polished toes are ok) You may wear deodorant.  If you are to be admitted after surgery, leave suitcase in car until your room has been assigned. Patients discharged on the day of surgery will not be allowed to drive home. Wear loose fitting, comfortable clothes for your ride home.

## 2015-03-22 NOTE — Pre-Procedure Instructions (Signed)
Dr. Delrae AlfredM. Judd made aware of pt's glucose result of 235.

## 2015-03-23 ENCOUNTER — Inpatient Hospital Stay (HOSPITAL_COMMUNITY): Payer: Medicaid Other | Admitting: Anesthesiology

## 2015-03-23 ENCOUNTER — Encounter (HOSPITAL_COMMUNITY)
Admission: RE | Disposition: A | Discharge status: Home / Self Care | Payer: Self-pay | Source: Ambulatory Visit | Attending: Obstetrics and Gynecology

## 2015-03-23 ENCOUNTER — Other Ambulatory Visit: Payer: Self-pay | Admitting: Obstetrics and Gynecology

## 2015-03-23 ENCOUNTER — Inpatient Hospital Stay (HOSPITAL_COMMUNITY)
Admission: RE | Admit: 2015-03-23 | Discharge: 2015-03-26 | DRG: 765 | Disposition: A | Discharge status: Home / Self Care | Payer: Medicaid Other | Source: Ambulatory Visit | Attending: Obstetrics and Gynecology | Admitting: Obstetrics and Gynecology

## 2015-03-23 ENCOUNTER — Encounter (HOSPITAL_COMMUNITY): Payer: Self-pay

## 2015-03-23 DIAGNOSIS — O34212 Maternal care for vertical scar from previous cesarean delivery: Principal | ICD-10-CM | POA: Diagnosis present

## 2015-03-23 DIAGNOSIS — Z6841 Body Mass Index (BMI) 40.0 and over, adult: Secondary | ICD-10-CM

## 2015-03-23 DIAGNOSIS — E119 Type 2 diabetes mellitus without complications: Secondary | ICD-10-CM | POA: Diagnosis present

## 2015-03-23 DIAGNOSIS — N736 Female pelvic peritoneal adhesions (postinfective): Secondary | ICD-10-CM | POA: Diagnosis present

## 2015-03-23 DIAGNOSIS — Z3A36 36 weeks gestation of pregnancy: Secondary | ICD-10-CM

## 2015-03-23 DIAGNOSIS — N858 Other specified noninflammatory disorders of uterus: Secondary | ICD-10-CM | POA: Diagnosis present

## 2015-03-23 DIAGNOSIS — O99334 Smoking (tobacco) complicating childbirth: Secondary | ICD-10-CM | POA: Diagnosis present

## 2015-03-23 DIAGNOSIS — Z3483 Encounter for supervision of other normal pregnancy, third trimester: Secondary | ICD-10-CM | POA: Diagnosis present

## 2015-03-23 DIAGNOSIS — O1092 Unspecified pre-existing hypertension complicating childbirth: Secondary | ICD-10-CM | POA: Diagnosis present

## 2015-03-23 DIAGNOSIS — O2412 Pre-existing diabetes mellitus, type 2, in childbirth: Secondary | ICD-10-CM | POA: Diagnosis present

## 2015-03-23 DIAGNOSIS — O99824 Streptococcus B carrier state complicating childbirth: Secondary | ICD-10-CM | POA: Diagnosis present

## 2015-03-23 DIAGNOSIS — O99214 Obesity complicating childbirth: Secondary | ICD-10-CM | POA: Diagnosis present

## 2015-03-23 DIAGNOSIS — O9989 Other specified diseases and conditions complicating pregnancy, childbirth and the puerperium: Secondary | ICD-10-CM | POA: Diagnosis present

## 2015-03-23 DIAGNOSIS — Z98891 History of uterine scar from previous surgery: Secondary | ICD-10-CM

## 2015-03-23 LAB — GLUCOSE, CAPILLARY
GLUCOSE-CAPILLARY: 110 mg/dL — AB (ref 65–99)
GLUCOSE-CAPILLARY: 159 mg/dL — AB (ref 65–99)
Glucose-Capillary: 134 mg/dL — ABNORMAL HIGH (ref 65–99)
Glucose-Capillary: 121 mg/dL — ABNORMAL HIGH (ref 65–99)

## 2015-03-23 LAB — HEPATITIS B SURFACE ANTIGEN: Hepatitis B Surface Ag: NEGATIVE

## 2015-03-23 LAB — PREPARE RBC (CROSSMATCH)

## 2015-03-23 LAB — RPR: RPR: NONREACTIVE

## 2015-03-23 SURGERY — Surgical Case
Anesthesia: Spinal | Laterality: Bilateral

## 2015-03-23 MED ORDER — SIMETHICONE 80 MG PO CHEW
80.0000 mg | CHEWABLE_TABLET | Freq: Three times a day (TID) | ORAL | Status: DC
  Administered 2015-03-23 – 2015-03-26 (×8): 80 mg via ORAL
  Filled 2015-03-23 (×8): qty 1

## 2015-03-23 MED ORDER — DIPHENHYDRAMINE HCL 50 MG/ML IJ SOLN: 12.5000 mg | INTRAMUSCULAR | Status: DC | PRN

## 2015-03-23 MED ORDER — IBUPROFEN 600 MG PO TABS
600.0000 mg | ORAL_TABLET | Freq: Four times a day (QID) | ORAL | Status: DC
Start: 2015-03-24 — End: 2015-03-26
  Administered 2015-03-23 – 2015-03-26 (×10): 600 mg via ORAL
  Filled 2015-03-23 (×12): qty 1

## 2015-03-23 MED ORDER — OXYTOCIN 40 UNITS IN LACTATED RINGERS INFUSION - SIMPLE MED: 62.5000 mL/h | INTRAVENOUS | Status: AC

## 2015-03-23 MED ORDER — PHENYLEPHRINE 40 MCG/ML (10ML) SYRINGE FOR IV PUSH (FOR BLOOD PRESSURE SUPPORT)
PREFILLED_SYRINGE | INTRAVENOUS | Status: AC
  Filled 2015-03-23: qty 10

## 2015-03-23 MED ORDER — FENTANYL CITRATE (PF) 100 MCG/2ML IJ SOLN
INTRAMUSCULAR | Status: AC
  Filled 2015-03-23: qty 4

## 2015-03-23 MED ORDER — DIPHENHYDRAMINE HCL 25 MG PO CAPS
25.0000 mg | ORAL_CAPSULE | Freq: Four times a day (QID) | ORAL | Status: DC | PRN
Start: 2015-03-23 — End: 2015-03-26

## 2015-03-23 MED ORDER — NALBUPHINE HCL 10 MG/ML IJ SOLN
5.0000 mg | INTRAMUSCULAR | Status: DC | PRN
Start: 2015-03-23 — End: 2015-03-26

## 2015-03-23 MED ORDER — LIDOCAINE HCL (PF) 1 % IJ SOLN: INTRAMUSCULAR | Status: DC | PRN

## 2015-03-23 MED ORDER — PRENATAL MULTIVITAMIN CH
1.0000 | ORAL_TABLET | Freq: Every day | ORAL | Status: DC
  Administered 2015-03-24 – 2015-03-25 (×2): 1 via ORAL
  Filled 2015-03-23 (×2): qty 1

## 2015-03-23 MED ORDER — METOCLOPRAMIDE HCL 5 MG/ML IJ SOLN
INTRAMUSCULAR | Status: AC
  Filled 2015-03-23: qty 2

## 2015-03-23 MED ORDER — PHENYLEPHRINE 8 MG IN D5W 100 ML (0.08MG/ML) PREMIX OPTIME
INJECTION | INTRAVENOUS | Status: AC
  Filled 2015-03-23: qty 100

## 2015-03-23 MED ORDER — NALOXONE HCL 2 MG/2ML IJ SOSY: 1.0000 ug/kg/h | PREFILLED_SYRINGE | INTRAVENOUS | Status: DC | PRN

## 2015-03-23 MED ORDER — SCOPOLAMINE 1 MG/3DAYS TD PT72
1.0000 | MEDICATED_PATCH | Freq: Once | TRANSDERMAL | Status: DC
Start: 2015-03-23 — End: 2015-03-23
  Administered 2015-03-23: 1.5 mg via TRANSDERMAL

## 2015-03-23 MED ORDER — LABETALOL HCL 200 MG PO TABS
200.0000 mg | ORAL_TABLET | Freq: Two times a day (BID) | ORAL | Status: DC
  Administered 2015-03-23 – 2015-03-26 (×6): 200 mg via ORAL
  Filled 2015-03-23 (×6): qty 1

## 2015-03-23 MED ORDER — EPINEPHRINE HCL 0.1 MG/ML IJ SOSY
PREFILLED_SYRINGE | INTRAMUSCULAR | Status: AC
  Filled 2015-03-23: qty 10

## 2015-03-23 MED ORDER — WITCH HAZEL-GLYCERIN EX PADS: 1.0000 "application " | MEDICATED_PAD | CUTANEOUS | Status: DC | PRN

## 2015-03-23 MED ORDER — NALBUPHINE HCL 10 MG/ML IJ SOLN: 5.0000 mg | Freq: Once | INTRAMUSCULAR | Status: DC | PRN

## 2015-03-23 MED ORDER — INSULIN ASPART PROT & ASPART (70-30 MIX) 100 UNIT/ML ~~LOC~~ SUSP
45.0000 [IU] | Freq: Every day | SUBCUTANEOUS | Status: DC
  Administered 2015-03-24 – 2015-03-26 (×3): 45 [IU] via SUBCUTANEOUS

## 2015-03-23 MED ORDER — MEPERIDINE HCL 25 MG/ML IJ SOLN: 6.2500 mg | INTRAMUSCULAR | Status: DC | PRN

## 2015-03-23 MED ORDER — GLYCOPYRROLATE 0.2 MG/ML IJ SOLN
INTRAMUSCULAR | Status: AC
  Filled 2015-03-23: qty 1

## 2015-03-23 MED ORDER — KETOROLAC TROMETHAMINE 30 MG/ML IJ SOLN
30.0000 mg | Freq: Four times a day (QID) | INTRAMUSCULAR | Status: AC | PRN
  Administered 2015-03-23: 30 mg via INTRAMUSCULAR

## 2015-03-23 MED ORDER — NALOXONE HCL 0.4 MG/ML IJ SOLN: 0.4000 mg | INTRAMUSCULAR | Status: DC | PRN

## 2015-03-23 MED ORDER — OXYTOCIN 10 UNIT/ML IJ SOLN
INTRAMUSCULAR | Status: AC
  Filled 2015-03-23: qty 4

## 2015-03-23 MED ORDER — ONDANSETRON HCL 4 MG/2ML IJ SOLN
INTRAMUSCULAR | Status: AC
  Filled 2015-03-23: qty 2

## 2015-03-23 MED ORDER — ZOLPIDEM TARTRATE 5 MG PO TABS: 5.0000 mg | ORAL_TABLET | Freq: Every evening | ORAL | Status: DC | PRN

## 2015-03-23 MED ORDER — CARBOPROST TROMETHAMINE 250 MCG/ML IM SOLN
INTRAMUSCULAR | Status: DC | PRN
  Administered 2015-03-23: 250 ug via INTRAMUSCULAR

## 2015-03-23 MED ORDER — SCOPOLAMINE 1 MG/3DAYS TD PT72: 1.0000 | MEDICATED_PATCH | Freq: Once | TRANSDERMAL | Status: DC

## 2015-03-23 MED ORDER — PROMETHAZINE HCL 25 MG/ML IJ SOLN
6.2500 mg | INTRAMUSCULAR | Status: DC | PRN
Start: 2015-03-23 — End: 2015-03-23

## 2015-03-23 MED ORDER — LACTATED RINGERS IV SOLN: INTRAVENOUS | Status: DC

## 2015-03-23 MED ORDER — LANOLIN HYDROUS EX OINT: 1.0000 "application " | TOPICAL_OINTMENT | CUTANEOUS | Status: DC | PRN

## 2015-03-23 MED ORDER — SCOPOLAMINE 1 MG/3DAYS TD PT72
MEDICATED_PATCH | TRANSDERMAL | Status: AC
  Filled 2015-03-23: qty 1

## 2015-03-23 MED ORDER — HYDROCOD POLST-CPM POLST ER 10-8 MG/5ML PO SUER
5.0000 mL | Freq: Two times a day (BID) | ORAL | Status: DC
  Administered 2015-03-23 – 2015-03-26 (×6): 5 mL via ORAL
  Filled 2015-03-23 (×6): qty 5

## 2015-03-23 MED ORDER — BETAMETHASONE SOD PHOS & ACET 6 (3-3) MG/ML IJ SUSP
12.0000 mg | Freq: Once | INTRAMUSCULAR | Status: DC
  Filled 2015-03-23: qty 2

## 2015-03-23 MED ORDER — OXYTOCIN 10 UNIT/ML IJ SOLN
40.0000 [IU] | INTRAMUSCULAR | Status: DC | PRN
  Administered 2015-03-23: 40 [IU] via INTRAVENOUS

## 2015-03-23 MED ORDER — LACTATED RINGERS IV SOLN
INTRAVENOUS | Status: DC
Start: 2015-03-23 — End: 2015-03-23
  Administered 2015-03-23 (×4): via INTRAVENOUS

## 2015-03-23 MED ORDER — ACETAMINOPHEN 325 MG PO TABS
650.0000 mg | ORAL_TABLET | ORAL | Status: DC | PRN
  Administered 2015-03-26: 650 mg via ORAL
  Filled 2015-03-23: qty 2

## 2015-03-23 MED ORDER — LACTATED RINGERS IV SOLN
INTRAVENOUS | Status: DC
  Administered 2015-03-23: 23:00:00 via INTRAVENOUS

## 2015-03-23 MED ORDER — TETANUS-DIPHTH-ACELL PERTUSSIS 5-2.5-18.5 LF-MCG/0.5 IM SUSP: 0.5000 mL | Freq: Once | INTRAMUSCULAR | Status: DC

## 2015-03-23 MED ORDER — MORPHINE SULFATE (PF) 0.5 MG/ML IJ SOLN
INTRAMUSCULAR | Status: DC | PRN
  Administered 2015-03-23: .1 mg via INTRATHECAL

## 2015-03-23 MED ORDER — SODIUM CHLORIDE 0.9 % IJ SOLN: 3.0000 mL | INTRAMUSCULAR | Status: DC | PRN

## 2015-03-23 MED ORDER — GLYCOPYRROLATE 0.2 MG/ML IJ SOLN
INTRAMUSCULAR | Status: DC | PRN
  Administered 2015-03-23: 0.1 mg via INTRAVENOUS

## 2015-03-23 MED ORDER — DIBUCAINE 1 % RE OINT: 1.0000 "application " | TOPICAL_OINTMENT | RECTAL | Status: DC | PRN

## 2015-03-23 MED ORDER — ONDANSETRON HCL 4 MG/2ML IJ SOLN
INTRAMUSCULAR | Status: DC | PRN
  Administered 2015-03-23: 4 mg via INTRAVENOUS

## 2015-03-23 MED ORDER — SIMETHICONE 80 MG PO CHEW
80.0000 mg | CHEWABLE_TABLET | ORAL | Status: DC | PRN
  Filled 2015-03-23: qty 1

## 2015-03-23 MED ORDER — SODIUM BICARBONATE 8.4 % IV SOLN
INTRAVENOUS | Status: AC
  Filled 2015-03-23: qty 50

## 2015-03-23 MED ORDER — KETOROLAC TROMETHAMINE 30 MG/ML IJ SOLN: 30.0000 mg | Freq: Four times a day (QID) | INTRAMUSCULAR | Status: AC | PRN

## 2015-03-23 MED ORDER — OXYCODONE-ACETAMINOPHEN 5-325 MG PO TABS: 2.0000 | ORAL_TABLET | ORAL | Status: DC | PRN

## 2015-03-23 MED ORDER — INSULIN ASPART PROT & ASPART (70-30 MIX) 100 UNIT/ML ~~LOC~~ SUSP
35.0000 [IU] | Freq: Every day | SUBCUTANEOUS | Status: DC
  Administered 2015-03-23 – 2015-03-25 (×3): 35 [IU] via SUBCUTANEOUS
  Filled 2015-03-23: qty 10

## 2015-03-23 MED ORDER — SODIUM BICARBONATE 8.4 % IV SOLN
INTRAVENOUS | Status: DC | PRN
  Administered 2015-03-23: 3 mL via EPIDURAL
  Administered 2015-03-23: 2 mL via EPIDURAL

## 2015-03-23 MED ORDER — MORPHINE SULFATE (PF) 0.5 MG/ML IJ SOLN
INTRAMUSCULAR | Status: AC
  Filled 2015-03-23: qty 100

## 2015-03-23 MED ORDER — INSULIN ASPART 100 UNIT/ML ~~LOC~~ SOLN
0.0000 [IU] | Freq: Three times a day (TID) | SUBCUTANEOUS | Status: DC
  Administered 2015-03-24 – 2015-03-26 (×4): 3 [IU] via SUBCUTANEOUS

## 2015-03-23 MED ORDER — PHENYLEPHRINE 8 MG IN D5W 100 ML (0.08MG/ML) PREMIX OPTIME
INJECTION | INTRAVENOUS | Status: DC | PRN
  Administered 2015-03-23: 30 ug/min via INTRAVENOUS

## 2015-03-23 MED ORDER — BUPIVACAINE IN DEXTROSE 0.75-8.25 % IT SOLN
INTRATHECAL | Status: AC
  Filled 2015-03-23: qty 2

## 2015-03-23 MED ORDER — MENTHOL 3 MG MT LOZG
1.0000 | LOZENGE | OROMUCOSAL | Status: DC | PRN
Start: 2015-03-23 — End: 2015-03-26

## 2015-03-23 MED ORDER — PHENYLEPHRINE HCL 10 MG/ML IJ SOLN
INTRAMUSCULAR | Status: DC | PRN
  Administered 2015-03-23: 40 ug via INTRAVENOUS

## 2015-03-23 MED ORDER — SIMETHICONE 80 MG PO CHEW
80.0000 mg | CHEWABLE_TABLET | ORAL | Status: DC
  Administered 2015-03-23 – 2015-03-25 (×3): 80 mg via ORAL
  Filled 2015-03-23 (×3): qty 1

## 2015-03-23 MED ORDER — FENTANYL CITRATE (PF) 100 MCG/2ML IJ SOLN
INTRAMUSCULAR | Status: DC | PRN
  Administered 2015-03-23: 25 ug via INTRATHECAL

## 2015-03-23 MED ORDER — LIDOCAINE-EPINEPHRINE (PF) 2 %-1:200000 IJ SOLN
INTRAMUSCULAR | Status: AC
  Filled 2015-03-23: qty 20

## 2015-03-23 MED ORDER — METOCLOPRAMIDE HCL 5 MG/ML IJ SOLN
INTRAMUSCULAR | Status: DC | PRN
  Administered 2015-03-23: 10 mg via INTRAVENOUS

## 2015-03-23 MED ORDER — OXYCODONE-ACETAMINOPHEN 5-325 MG PO TABS
1.0000 | ORAL_TABLET | ORAL | Status: DC | PRN
  Administered 2015-03-25: 1 via ORAL
  Filled 2015-03-23: qty 1

## 2015-03-23 MED ORDER — BUPIVACAINE IN DEXTROSE 0.75-8.25 % IT SOLN
INTRATHECAL | Status: DC | PRN
  Administered 2015-03-23: 1.6 mL via INTRATHECAL

## 2015-03-23 MED ORDER — KETOROLAC TROMETHAMINE 30 MG/ML IJ SOLN
INTRAMUSCULAR | Status: AC
Start: 2015-03-23 — End: 2015-03-24
  Filled 2015-03-23: qty 1

## 2015-03-23 MED ORDER — DIPHENHYDRAMINE HCL 25 MG PO CAPS: 25.0000 mg | ORAL_CAPSULE | ORAL | Status: DC | PRN

## 2015-03-23 MED ORDER — SENNOSIDES-DOCUSATE SODIUM 8.6-50 MG PO TABS
2.0000 | ORAL_TABLET | ORAL | Status: DC
  Administered 2015-03-23 – 2015-03-25 (×3): 2 via ORAL
  Filled 2015-03-23 (×3): qty 2

## 2015-03-23 MED ORDER — ONDANSETRON HCL 4 MG/2ML IJ SOLN: 4.0000 mg | Freq: Three times a day (TID) | INTRAMUSCULAR | Status: DC | PRN

## 2015-03-23 SURGICAL SUPPLY — 37 items
BARRIER ADHS 3X4 INTERCEED (GAUZE/BANDAGES/DRESSINGS) ×3 IMPLANT
BENZOIN TINCTURE PRP APPL 2/3 (GAUZE/BANDAGES/DRESSINGS) ×3 IMPLANT
CLAMP CORD UMBIL (MISCELLANEOUS) IMPLANT
CLOSURE WOUND 1/2 X4 (GAUZE/BANDAGES/DRESSINGS) ×1
CLOTH BEACON ORANGE TIMEOUT ST (SAFETY) ×3 IMPLANT
CONTAINER PREFILL 10% NBF 15ML (MISCELLANEOUS) IMPLANT
DRAPE SHEET LG 3/4 BI-LAMINATE (DRAPES) IMPLANT
DRSG OPSITE 6X11 MED (GAUZE/BANDAGES/DRESSINGS) ×3 IMPLANT
DRSG OPSITE POSTOP 4X10 (GAUZE/BANDAGES/DRESSINGS) ×3 IMPLANT
DURAPREP 26ML APPLICATOR (WOUND CARE) ×3 IMPLANT
ELECT REM PT RETURN 9FT ADLT (ELECTROSURGICAL) ×3
ELECTRODE REM PT RTRN 9FT ADLT (ELECTROSURGICAL) ×1 IMPLANT
EXTRACTOR VACUUM M CUP 4 TUBE (SUCTIONS) IMPLANT
EXTRACTOR VACUUM M CUP 4' TUBE (SUCTIONS)
GLOVE BIO SURGEON STRL SZ7.5 (GLOVE) ×3 IMPLANT
GLOVE BIOGEL PI IND STRL 7.5 (GLOVE) ×1 IMPLANT
GLOVE BIOGEL PI INDICATOR 7.5 (GLOVE) ×2
GOWN STRL REUS W/TWL LRG LVL3 (GOWN DISPOSABLE) ×6 IMPLANT
KIT ABG SYR 3ML LUER SLIP (SYRINGE) IMPLANT
NEEDLE HYPO 22GX1.5 SAFETY (NEEDLE) IMPLANT
NEEDLE HYPO 25X5/8 SAFETYGLIDE (NEEDLE) IMPLANT
NS IRRIG 1000ML POUR BTL (IV SOLUTION) ×3 IMPLANT
PACK C SECTION WH (CUSTOM PROCEDURE TRAY) ×3 IMPLANT
PAD OB MATERNITY 4.3X12.25 (PERSONAL CARE ITEMS) ×3 IMPLANT
PENCIL SMOKE EVAC W/HOLSTER (ELECTROSURGICAL) ×3 IMPLANT
RTRCTR C-SECT PINK 25CM LRG (MISCELLANEOUS) ×3 IMPLANT
STRIP CLOSURE SKIN 1/2X4 (GAUZE/BANDAGES/DRESSINGS) ×2 IMPLANT
SUT CHROMIC 2 0 CT 1 (SUTURE) ×6 IMPLANT
SUT MNCRL AB 3-0 PS2 27 (SUTURE) ×3 IMPLANT
SUT PLAIN 0 NONE (SUTURE) IMPLANT
SUT PLAIN 2 0 XLH (SUTURE) ×6 IMPLANT
SUT VIC AB 0 CT1 36 (SUTURE) ×3 IMPLANT
SUT VIC AB 0 CTX 36 (SUTURE) ×6
SUT VIC AB 0 CTX36XBRD ANBCTRL (SUTURE) ×3 IMPLANT
SYR CONTROL 10ML LL (SYRINGE) IMPLANT
TOWEL OR 17X24 6PK STRL BLUE (TOWEL DISPOSABLE) ×3 IMPLANT
TRAY FOLEY CATH SILVER 14FR (SET/KITS/TRAYS/PACK) ×3 IMPLANT

## 2015-03-23 NOTE — Consult Note (Signed)
Neonatology Note:   Attendance at C-section:   I was asked by Dr. Roberts to attend this repeat C/S at 36 4/7 weeks due to gestational hypertension and Type 2 DM. The mother is a G5P2A2 O pos, GBS pos with incompetent cervix with cervical cerclage, chronic HTN (labetalol), Type 2 DM (insulin), cigarette smoking, a history of HSV, and obesity. She was given BMZ on 10/22-23. ROM at delivery, fluid clear. Infant vigorous with good spontaneous cry and tone. Needed only minimal bulb suctioning. Ap 8/9. Lungs clear to ausc in DR. Placed a pulse oximeter at 6 minutes, O2 saturation 77% in room air, so we gave BBO2 for about 1 minute, with prompt increase in O2 sats. Withdrew supplemental O2, baby maintained O2 sats > 90%. Instructed RN to take baby to CN if O2 sats drop below 88% during skin to skin time. To CN to care of Pediatrician.  Adea Geisel C. Princessa Lesmeister, MD 

## 2015-03-23 NOTE — Anesthesia Preprocedure Evaluation (Addendum)
Anesthesia Evaluation  Patient identified by MRN, date of birth, ID band Patient awake    Reviewed: Allergy & Precautions, NPO status , Patient's Chart, lab work & pertinent test results, reviewed documented beta blocker date and time   Airway Mallampati: II  TM Distance: >3 FB Neck ROM: Full    Dental  (+) Teeth Intact   Pulmonary shortness of breath, Current Smoker,    breath sounds clear to auscultation       Cardiovascular hypertension, Pt. on medications and Pt. on home beta blockers  Rhythm:Regular Rate:Normal     Neuro/Psych PSYCHIATRIC DISORDERS Anxiety negative neurological ROS     GI/Hepatic negative GI ROS, Neg liver ROS,   Endo/Other  diabetes, Type 2, Insulin Dependent  Renal/GU negative Renal ROS  negative genitourinary   Musculoskeletal negative musculoskeletal ROS (+)   Abdominal   Peds negative pediatric ROS (+)  Hematology negative hematology ROS (+)   Anesthesia Other Findings   Reproductive/Obstetrics (+) Pregnancy                            Anesthesia Physical Anesthesia Plan  ASA: III  Anesthesia Plan: Spinal   Post-op Pain Management:    Induction:   Airway Management Planned:   Additional Equipment:   Intra-op Plan:   Post-operative Plan:   Informed Consent:   Plan Discussed with:   Anesthesia Plan Comments:         Anesthesia Quick Evaluation

## 2015-03-23 NOTE — Op Note (Signed)
Cesarean Section Procedure Note  Indications: P2 at 51 4/7wks with h/o classical c-section presenting for scheduled repeat c-section, sterilization and removal of cerclage.  Pre-operative Diagnosis: Prior Cesarean Section with Desire for Surgical Sterilzation   Post-operative Diagnosis: Repeat Cesarean Section with NO Bitubal Ligation due to Scar Tissue Cerclage removal  Procedure: 1.CESAREAN SECTION 2.LOA 3.REMOVAL OF CERCLAGE  Surgeon: Osborn Coho, MD    Assistants: 1st Assist - Alphonzo Severance, CNM and 2nd Assist - Nigel Bridgeman, CNM  Anesthesia: Spinal  Anesthesiologist: Dr. Hart Rochester  Procedure Details  The patient was taken to the operating room secondary to h/o classical c-section after the risks, benefits, complications, treatment options, and expected outcomes were discussed with the patient.  The patient concurred with the proposed plan, giving informed consent which was signed and witnessed. The patient was taken to Operating Room C-Section Suite, identified as Robyn [name] and the procedure verified as C-Section Delivery. A Time Out was held and the above information confirmed.  After induction of anesthesia by obtaining a spinal, the patient was prepped and draped in the usual sterile manner. A Pfannenstiel skin incision was made and carried down through the subcutaneous tissue to the underlying layer of fascia.  The fascia was incised bilaterally and extended transversely bilaterally with the Mayo scissors. Kocher clamps were placed on the inferior aspect of the fascial incision and the underlying rectus muscle was separated from the fascia. The same was done on the superior aspect of the fascial incision.  The peritoneum was identified, entered bluntly and extended manually.  An Alexis self-retaining retractor was placed after lysing as many adhesions as safely possible.  Some bleeding was noted upon placement.  The utero-vesical peritoneal reflection was incised transversely and  the bladder flap was sharply dissected from the lower uterine segment. A low transverse uterine incision was made with the scalpel and extended bilaterally with the bandage scissors.  The infant was delivered in vertex position without difficulty.  After the umbilical cord was clamped and cut, the infant was handed to the awaiting pediatricians.  Cord blood was obtained for evaluation.  The placenta was removed intact and appeared to be within normal limits. The uterus was cleared of all clots and debris.  There was some atony noted and hemabate was given with good results.  The uterine incision was closed with running interlocking sutures of 0 Vicryl and a second imbricating layer was performed as well.   Bilateral tubes and ovaries were unable to be identified secondary to many adhesions of bowel and omentum to sides of uterus and fundus.  Good hemostasis was noted of the uterine incision after copious irrigation was performed.  The peritoneum was repaired with 2-0 chromic via a running suture that in part incorported some of the uterine serosa due to adhesions.  The fascia was reapproximated with a running suture of 0 Vicryl. The subcutaneous tissue was reapproximated with 3 interrupted sutures of 2-0 plain.  The skin was reapproximated with a subcuticular suture of 3-0 monocryl.  Steristrips were applied with benzoin.  Instrument, sponge, and needle counts were correct prior to abdominal closure and at the conclusion of the case.  The patient was awaiting transfer to the recovery room in good condition.  Findings: Live female infant with Apgars 8 at one minute and 9 at five minutes.  Bilateral ovaries and fallopian tubes were unable to be identified secondary to adhesions.  Estimated Blood Loss:  900 ml         Drains: foley to  gravity of 300 cc of clear urine         Total IV Fluids: 2400 ml         Specimens to Pathology: Placenta         Complications:  None; patient tolerated the procedure  well.         Disposition: PACU - hemodynamically stable.         Condition: stable  Attending Attestation: I performed the procedure.

## 2015-03-23 NOTE — Anesthesia Procedure Notes (Signed)
Spinal Patient location during procedure: OR Start time: 03/23/2015 2:26 PM End time: 03/23/2015 2:29 PM Staffing Anesthesiologist: Suella Broad D Performed by: anesthesiologist  Preanesthetic Checklist Completed: patient identified, site marked, surgical consent, pre-op evaluation, timeout performed, IV checked, risks and benefits discussed and monitors and equipment checked Spinal Block Patient position: sitting Prep: Betadine Patient monitoring: heart rate, continuous pulse ox, blood pressure and cardiac monitor Approach: midline Location: L4-5 Injection technique: single-shot Needle Needle type: Whitacre and Introducer  Needle gauge: 24 G Needle length: 9 cm Additional Notes Negative paresthesia. Negative blood return. Positive free-flowing CSF. Expiration date of kit checked and confirmed. Patient tolerated procedure well, without complications.  CSE method, epidural catheter in place, negative test dose.

## 2015-03-23 NOTE — Anesthesia Postprocedure Evaluation (Signed)
  Anesthesia Post-op Note  Patient: Robyn [name]  Procedure(s) Performed: Procedure(s) with comments: CESAREAN SECTION WITH BILATERAL TUBAL LIGATION (Bilateral) - Amt OK'd to move 02/25/2015  Patient Location: PACU  Anesthesia Type:Spinal  Level of Consciousness: awake, alert  and oriented  Airway and Oxygen Therapy: Patient Spontanous Breathing  Post-op Pain: none  Post-op Assessment: Post-op Vital signs reviewed and Patient's Cardiovascular Status Stable              Post-op Vital Signs: Reviewed and stable  Last Vitals:  Filed Vitals:   03/23/15 1833  BP: 134/70  Pulse: 67  Temp: 36.3 C  Resp: 20    Complications: No apparent anesthesia complications

## 2015-03-23 NOTE — Transfer of Care (Signed)
Immediate Anesthesia Transfer of Care Note  Patient: Robyn RubensteinRasherra [name]  Procedure(s) Performed: Procedure(s) with comments: CESAREAN SECTION WITH BILATERAL TUBAL LIGATION (Bilateral) - Amt OK'd to move 02/25/2015  Patient Location: PACU  Anesthesia Type:Spinal and Epidural  Level of Consciousness: awake, alert , oriented and patient cooperative  Airway & Oxygen Therapy: Patient Spontanous Breathing  Post-op Assessment: Report given to RN and Post -op Vital signs reviewed and stable  Post vital signs: Reviewed and stable  Last Vitals:  Filed Vitals:   03/23/15 1306  BP: 143/85  Pulse: 93  Temp: 36.7 C  Resp: 20    Complications: No apparent anesthesia complications

## 2015-03-23 NOTE — Interval H&P Note (Signed)
History and Physical Interval Note:  03/23/2015 1:41 PM  Robyn [name]  has presented today for surgery, with the diagnosis of Prior Cesarean Section with Desire for Surgical Sterilzation  The various methods of treatment have been discussed with the patient and family. After consideration of risks, benefits and other options for treatment, the patient has consented to  Procedure(s) with comments: CESAREAN SECTION WITH BILATERAL TUBAL LIGATION/POSSIBLE BILATERAL SALPINGECTOMY/REMOVAL OF CERCLAGE (Bilateral) - Amt OK'd to move 02/25/2015 as a surgical intervention .  The patient's history has been reviewed, patient examined, no change in status, stable for surgery.  I have reviewed the patient's chart and labs.  Questions were answered to the patient's satisfaction.  The risks, benefits and alternatives were discussed with the patient, patient verbalized understanding and consent signed and witnessed.   [name],[name]

## 2015-03-23 NOTE — Lactation Note (Signed)
This note was copied from the chart of Girl Shelly RubensteinRasherra Levengood. Lactation Consultation Note  Patient Name: Girl Shelly RubensteinRasherra Brostrom YNWGN'FToday's Date: 03/23/2015 Reason for consult: Initial assessment BF mom that has a Hx of PCOS and DM. She has limited bf experience but all of her babies were born at 736wk. Went over PTI information, breast changes, feeding frequency, belly size, voids, nipple care, and manual expression. She requests to ebf at this time and does not want to start pumping yet. Given lactation handouts, she is aware of O/P services and support group. She will call as needed for bf help.   Maternal Data Has patient been taught Hand Expression?: Yes Does the patient have breastfeeding experience prior to this delivery?: Yes  Feeding Feeding Type: Breast Fed Length of feed: 5 min  LATCH Score/Interventions Latch: Repeated attempts needed to sustain latch, nipple held in mouth throughout feeding, stimulation needed to elicit sucking reflex. Intervention(s): Adjust position;Assist with latch;Breast compression  Audible Swallowing: Spontaneous and intermittent  Type of Nipple: Everted at rest and after stimulation  Comfort (Breast/Nipple): Soft / non-tender     Hold (Positioning): Full assist, staff holds infant at breast Intervention(s): Breastfeeding basics reviewed;Support Pillows;Position options;Skin to skin  LATCH Score: 7  Lactation Tools Discussed/Used WIC Program: Yes   Consult Status Consult Status: Follow-up Date: 03/24/15 Follow-up type: In-patient    Rulon Eisenmengerlizabeth E Cloa Bushong 03/23/2015, 6:33 PM

## 2015-03-23 NOTE — Interval H&P Note (Signed)
History and Physical Interval Note:  03/23/2015 1:41 PM  Shelly RubensteinRasherra Corp  has presented today for surgery, with the diagnosis of Prior Cesarean Section with Desire for Surgical Sterilzation  The various methods of treatment have been discussed with the patient and family. After consideration of risks, benefits and other options for treatment, the patient has consented to  Procedure(s) with comments: CESAREAN SECTION WITH BILATERAL TUBAL LIGATION/POSSIBLE BILATERAL SALPINGECTOMY/REMOVAL OF CERCLAGE  (Bilateral) - Amt OK'd to move 02/25/2015 as a surgical intervention .  The patient's history has been reviewed, patient examined, no change in status, stable for surgery.  I have reviewed the patient's chart and labs.  Questions were answered to the patient's satisfaction.     [name],Keyshun Elpers Y

## 2015-03-24 LAB — CBC
HEMATOCRIT: 36 % (ref 36.0–46.0)
HEMOGLOBIN: 11.8 g/dL — AB (ref 12.0–15.0)
MCH: 28 pg (ref 26.0–34.0)
MCHC: 32.8 g/dL (ref 30.0–36.0)
MCV: 85.3 fL (ref 78.0–100.0)
PLATELETS: 213 10*3/uL (ref 150–400)
RBC: 4.22 MIL/uL (ref 3.87–5.11)
RDW: 16 % — AB (ref 11.5–15.5)
WBC: 14.6 10*3/uL — AB (ref 4.0–10.5)

## 2015-03-24 LAB — GLUCOSE, CAPILLARY
GLUCOSE-CAPILLARY: 110 mg/dL — AB (ref 65–99)
GLUCOSE-CAPILLARY: 141 mg/dL — AB (ref 65–99)
Glucose-Capillary: 132 mg/dL — ABNORMAL HIGH (ref 65–99)
Glucose-Capillary: 150 mg/dL — ABNORMAL HIGH (ref 65–99)
Glucose-Capillary: 161 mg/dL — ABNORMAL HIGH (ref 65–99)

## 2015-03-24 LAB — RUBELLA ANTIBODY, IGM

## 2015-03-24 MED ORDER — MEASLES, MUMPS & RUBELLA VAC ~~LOC~~ INJ
0.5000 mL | INJECTION | Freq: Once | SUBCUTANEOUS | Status: AC
  Administered 2015-03-26: 0.5 mL via SUBCUTANEOUS
  Filled 2015-03-24 (×2): qty 0.5

## 2015-03-24 NOTE — Anesthesia Postprocedure Evaluation (Signed)
  Anesthesia Post-op Note  Patient: Robyn [name]  Procedure(s) Performed: Procedure(s) with comments: CESAREAN SECTION WITH BILATERAL TUBAL LIGATION (Bilateral) - Amt OK'd to move 02/25/2015  Patient Location: Mother/Baby  Anesthesia Type:Spinal and Epidural  Level of Consciousness: awake, alert  and oriented  Airway and Oxygen Therapy: Patient Spontanous Breathing  Post-op Pain: none  Post-op Assessment: Post-op Vital signs reviewed, Patient's Cardiovascular Status Stable, Respiratory Function Stable, Patent Airway, No signs of Nausea or vomiting, Adequate PO intake, Pain level controlled, No headache and No backache      patient walking        Post-op Vital Signs: Reviewed and stable  Last Vitals:  Filed Vitals:   03/24/15 0430  BP: 108/73  Pulse: 76  Temp: 37.1 C  Resp: 18    Complications: No apparent anesthesia complications

## 2015-03-24 NOTE — Addendum Note (Signed)
Addendum  created 03/24/15 0743 by Shanon PayorSuzanne M Cruze Zingaro, CRNA   Modules edited: Notes Section   Notes Section:  File: [id number]

## 2015-03-24 NOTE — Progress Notes (Signed)
Addendum [id number] Primary nurse called confused about the CBG timing.  Dr Sallye OberKulwa called, orders to nurse DC all other orders and do a Fasting and 2 hr postprandial only.    Addendum [id number] Nurse call to report she is not comfortable doing that schedule of CBG BC the pt is on a sliding scale of insulin.  Murse instructed to call Dr Sallye OberKulwa directly for new orders.

## 2015-03-24 NOTE — Lactation Note (Signed)
This note was copied from the chart of Robyn Shelly RubensteinRasherra Honda. Lactation Consultation Note: LPI at 36.4 week. Infant has only had 1-2 feeding and several attempts. Infant was given 4 ml of ebm and another 1ml.  Mother pumping when I entered the room. Mother pumped 5 ml. Infant placed in football position and latched on the left breast. Mother has large nipples.  Infant sustained latch for 10-15 mins. On and off with some nipple pinching. Infant was given 6 ml of ebm with a curved tip syringe. Advised parents of LPI behaviors and importance of following supplemental guidelines. Infant was fed 5 ml of alementum with a foley cup. Infant tolerated feeding well. Father at bedside and observed cup feeding technique. Advised mother to supplement infant every 2-3 hours . Mother was fit with a #30 flange. Mother advised to pump every 2-3 hours for 20 mins.  Mother is active with WIC. [facility]   Patient Name: Robyn [name]: 03/24/2016     Maternal Data    Feeding Feeding Type: Formula  LATCH Score/Interventions                      Lactation Tools Discussed/Used     Consult Status Consult Status: Follow-up [name]: 03/24/15 Follow-up type: In-patient    Stevan BornKendrick, [name] 03/24/2015, 4:53 PM

## 2015-03-24 NOTE — Progress Notes (Addendum)
Subjective: Postpartum Day 1: Cesarean Delivery due to repeat Patient up ad lib, reports no syncope or dizziness. Feeding:  breast Contraceptive plan:  Failed BTL, planning IUD at 6 weeks  Objective: Vital signs in last 24 hours: Temp:  [97.4 F (36.3 C)-98.9 F (37.2 C)] 98.7 F (37.1 C) (10/26 0430) Pulse Rate:  [62-94] 76 (10/26 0430) Resp:  [12-20] 18 (10/26 0430) BP: (108-143)/(55-85) 108/73 mmHg (10/26 0430) SpO2:  [94 %-100 %] 96 % (10/26 0430)   Filed Vitals:   03/23/15 1935 03/23/15 2030 03/23/15 2341 03/24/15 0430  BP: 142/68 128/58 122/55 108/73  Pulse: 75 77 94 76  Temp: 98.9 F (37.2 C) 98.4 F (36.9 C) 98.6 F (37 C) 98.7 F (37.1 C)  TempSrc: Oral Oral Oral Oral  Resp: 16 18 18 18   SpO2: 100% 100% 99% 96%   Physical Exam:  General: alert and cooperative Lochia: appropriate Uterine Fundus: firm Abdomen:  + bowel sounds, non distended Incision: no significant drainage  Honeycomb dressing CDI DVT Evaluation: No evidence of DVT seen on physical exam. Homan's sign: Negative   Recent Labs  03/22/15 0935 03/24/15 0557  HGB 12.1 11.8*  HCT 36.6 36.0  WBC 12.2* 14.6*  CBG 110 at 0648 Orthostatics stable.  Assessment: Status post Cesarean section day 1. Doing well postoperatively.  Honeycomb dressing in place, no significant drainage Anemia - hemodynamicly stable. HTN on Labetalol DM type 2 on insulin  Plan: Continue current care. Breastfeeding and Lactation consult Iron supplement Insulin with breakfast and 2hr PP   Venus Standard, CNM, MSN 03/24/2015. 8:51 AM  I saw and examined patient at bedside and agree with above findings assessment and plan.  Dr. Sallye OberKulwa.

## 2015-03-25 ENCOUNTER — Encounter (HOSPITAL_COMMUNITY): Payer: Self-pay | Admitting: Obstetrics and Gynecology

## 2015-03-25 LAB — GLUCOSE, CAPILLARY
GLUCOSE-CAPILLARY: 77 mg/dL (ref 65–99)
GLUCOSE-CAPILLARY: 172 mg/dL — AB (ref 65–99)
Glucose-Capillary: 140 mg/dL — ABNORMAL HIGH (ref 65–99)
Glucose-Capillary: 72 mg/dL (ref 65–99)

## 2015-03-25 NOTE — Progress Notes (Signed)
CLINICAL SOCIAL WORK MATERNAL/CHILD NOTE  Patient Details  Name: Robyn [name] MRN: [id number] Date of Birth: 03/23/2016  Date:  03/25/2015  Clinical Social Worker Initiating Note:  Sarah Venning,LCSW and Marisue IvanYazmin Random Dobrowski BSW, MSW intern   Date/ Time Initiated:  03/25/15/1000     Child's Name:  Robyn [name]    Legal Guardian:  Robyn [name] and Robyn [name]    Need for Interpreter:  None   Date of Referral:  03/24/15     Reason for Referral:  Behavioral Health Issues, including SI    [facility]   Address:  [address] [place], [place] [address]  Phone number:  [phone]   Household Members:  Self, Minor Children, Spouse   Natural Supports (not living in the home):  Immediate Family, Spouse/significant other, Children   Professional Supports:     Employment: Full-time   Type of Work:     Education:  IT sales professionalCollege graduate   Financial Resources:  Media plannerrivate Insurance   Other Resources:      Cultural/Religious Considerations Which May Impact Care:  None Reported   Strengths:  Ability to meet basic needs , Home prepared for child , Understanding of illness   Risk Factors/Current Problems:  Mental Health Concerns- MOB presents with a history of anxiety and panic attacks per chart review. However, MOB stated that it was a long time ago (more than 3 years ago, unable to recall specific dates).  and she only experienced two panic attacks and no longer sees it as a concerns. MOB discussed breathing techniques she was thaught by her doctor and voiced she  has found them helpful. MOB denies any mental health concerns during her pregnancy and stated she has not experienced any anxiety symptoms in a few years.   Cognitive State:  Able to Concentrate , Alert , Goal Oriented , Linear Thinking , Insightful    Mood/Affect:  Happy , Interested , Bright , Calm , Comfortable    CSW Assessment:  MSW intern presented in patients room due to a consult  being placed because of a history of anxiety and panic attacks. MOB presented to be in a happy mood as evidence by her bonding with her child and willingness to answer questions. Per MOB, the birthing process went well and she is transitioning well into postpartum. MOB shared she has two children, ages 6220 and 465. MOB expressed her birthing experiences being similar and overall being content with her experience at the hospital so far. MOB shared she is both breastfeeding and formula feeding her infant. MOB reported she has a good small support system from FOB and close friends. MOB shared she is from [place] so she does not have many family members here in [place]. MOB also shared her 35 year old is in college in [place]. However, MOB stated her children are very excited about the new addition to their family and are looking forward to help out as much as they can. MOB shared her transition back home should be easy because she is taking off 6-8 weeks from work and her youngest is in pre-school. MOB voiced she is currently [place] but is hoping to acquire a job closer to home now that she has two young children. MOB reported FOB works full-time as well but gets home early in order to help her out as well. MOB stated she lives with FOB and her 35 year old. MOB expressed having the home prepared  for the infant and meeting all of her basic needs.   MSW intern inquired about MOB's history of anxiety and panic attacks. MOB stated she experienced those symptoms years ago and it was only for a short period of time. MOB explained she experienced shortness of breath and felt her chest getting heavy when she was having panic attacks. MOB only recalled two panic attacks in her life and once she visited her doctor and was taught the appropriate breathing techniques she has not experienced another attack since. MOB was unable to recall the triggers for her panic attacks but did state  at that phase of her life she seemed to "worry" a lot. MOB denied being prescribed medications or attending therapy. MSW intern inquired about MOB's mental health during the pregnancy. MOB voiced she was fine and happy throughout her pregnancy. MOB shared she did not have any symptoms of anxiety and denied any history of perinatal mood disorders in her previous pregnancies. MSW intern provided education on perinatal mood disorders and the hospital's support group, "Feelings After Birth." MSW intern also reviewed some of MOB's breathing techniques and coping mechanisms for future reference just in case she was ever to experience a panic attack again or feel anxious. MOB denied having any concerns in that regard but agreed to contact her OB if needs arise. MOB was appreciative of the information provided by MSW intern and voiced interest in attending the hospital's support group.   MOB denied having any further questions or concerns but agreed to contact CSW if needs arise.   CSW Plan/Description:   Restaurant manager, fast food- MSW intern provided education on perinatal mood disorders and the hospital's support group, "Feelings After Birth."  No Further Intervention Required/No Barriers to Discharge    Kandis Cocking, Student-SW 03/25/2015, 10:21 AM

## 2015-03-25 NOTE — Progress Notes (Signed)
Robyn RubensteinRasherra [name] [id number]  Subjective: Postpartum Day 2:  Repeat C/S due to h/o classical c-section Patient up ad lib, reports no syncope or dizziness. Feeding:  Breast/bottle Contraceptive plan:  Failed BTL,  IUD at 6 weeks  Objective: Temp:  [98.1 F (36.7 C)-98.9 F (37.2 C)] 98.1 F (36.7 C) (10/27 0604) Pulse Rate:  [73-80] 73 (10/27 0604) Resp:  [18-20] 20 (10/27 0604) BP: (119-127)/(59-65) 127/59 mmHg (10/27 0604)  CBC Latest Ref Rng 03/24/2015 03/22/2015 10/02/2014  WBC 4.0 - 10.5 K/uL 14.6(H) 12.2(H) 11.3(H)  Hemoglobin 12.0 - 15.0 g/dL 11.8(L) 12.1 11.9(L)  Hematocrit 36.0 - 46.0 % 36.0 36.6 36.3  Platelets 150 - 400 K/uL 213 242 233   Filed Vitals:   03/23/15 2341 03/24/15 0430 03/24/15 1807 03/25/15 0604  BP: 122/55 108/73 119/65 127/59  Pulse: 94 76 80 73  Temp: 98.6 F (37 C) 98.7 F (37.1 C) 98.9 F (37.2 C) 98.1 F (36.7 C)  TempSrc: Oral Oral Oral Oral  Resp: 18 18 18 20   SpO2: 99% 96%      Physical Exam:  General: alert and cooperative Lochia: appropriate Uterine Fundus: firm Abdomen:  + bowel sounds, NT Incision:  Honeycomb dressing  Changed -  Incision CDI  DVT Evaluation: No evidence of DVT seen on physical exam.   Assessment/Plan: Status post cesarean delivery, day 2. Stable Continue current care. Plan for discharge tomorrow    Robyn SeveranceRachel Nakyah Erdmann MSN, CNM 03/25/2015, 3:33 PM

## 2015-03-25 NOTE — Plan of Care (Signed)
Problem: Discharge Progression Outcomes Goal: Barriers To Progression Addressed/Resolved Outcome: Progressing Diabetic consult done, pt does have endocrinologist to follow her after discharge. Still receiving short acting novolog based on AC blood sugars and 70/30 Novolog twice / day. Pt had only been on metformin at home prior to pregnancy. She understands how to check blood sugars and somewhat how to carb count. Her CBG's today 10/27 have been low enough she did not require short acting insulin dose with either lunch or supper.

## 2015-03-25 NOTE — Progress Notes (Signed)
Inpatient Diabetes Program Recommendations  AACE/ADA: New Consensus Statement on Inpatient Glycemic Control (2015)  Target Ranges:  Prepandial:   less than 140 mg/dL      Peak postprandial:   less than 180 mg/dL (1-2 hours)      Critically ill patients:  140 - 180 mg/dL   Review of Glycemic Control  Diabetes history: DM 2 Current orders for Inpatient glycemic control: 70/30 45 uits QAM, 35 units QPM, Novolog Resistant TID  Inpatient Diabetes Program Recommendations:   In review of patient, since she is post delivery, CBGs to be obtained ACHS and not postprandially at this stage. In review of glucose trend patient will need to continue current inpatient 70/30 dose of 45 units QAM, 35 units QPM as an outpatient due to patients weight of 320 lbs and insulin resistance. If patient is breastfeeding will avoid Metformin for now.  Thanks,  Christena DeemShannon Iceis Knab RN, MSN, [facility] Inpatient Diabetes Coordinator Team Pager [phone] (8a-5p)

## 2015-03-25 NOTE — Progress Notes (Signed)
Patient requested graham crackers with peanut butter and cranberry juice; she is not experiencing symptoms of low blood sugar at this time.

## 2015-03-25 NOTE — Progress Notes (Signed)
Pt HS blood sugar was 172 but she ate bread and fries cheese burger for dinner with no short acting insulin coverage as cbg was too low before supper. Snack ordered no meal coverage insulin given as she just ordered light snack. Will have AC cbg in am prior to breakfast.

## 2015-03-25 NOTE — Lactation Note (Signed)
This note was copied from the chart of Robyn Shelly RubensteinRasherra Brickhouse. Lactation Consultation Note Encouraged mom to increase supplement to 20-30 ml related to HOL.  Also reviewed pumping and hand expression.  Mom expressed 4 ml on the preemie setting at this session.   Patient Name: Robyn [name]     Maternal Data    Feeding Feeding Type: Bottle Fed - Formula Nipple Type: Slow - flow Length of feed: 10 min  LATCH Score/Interventions                      Lactation Tools Discussed/Used     Consult Status      Soyla DryerJoseph, Acea Yagi [name], 5:55 PM

## 2015-03-26 LAB — TYPE AND SCREEN
ABO/RH(D): O POS
Antibody Screen: POSITIVE
DAT, IGG: NEGATIVE
UNIT DIVISION: 0
Unit division: 0

## 2015-03-26 LAB — GLUCOSE, CAPILLARY: Glucose-Capillary: 148 mg/dL — ABNORMAL HIGH (ref 65–99)

## 2015-03-26 MED ORDER — OXYCODONE-ACETAMINOPHEN 5-325 MG PO TABS: 1.0000 | ORAL_TABLET | Freq: Four times a day (QID) | ORAL | Status: DC | PRN

## 2015-03-26 MED ORDER — IBUPROFEN 600 MG PO TABS: 600.0000 mg | ORAL_TABLET | Freq: Four times a day (QID) | ORAL | Status: DC | PRN

## 2015-03-26 NOTE — Lactation Note (Signed)
This note was copied from the chart of Robyn Shelly RubensteinRasherra Knopf. Lactation Consultation Note Follow up visit at 68 hours of age.  Mom is awaiting discharge.  Baby has been bottle fed formula due to GA of 6461w4d.  Mom is pumping and offering EBM with foley cup and getting a few mls with regular pumping.  Mom is latching baby some and plans to transition to more breast feedings at home. Encouraged mom to continue to document feedings and I&Os.  Mom has appt with peds tomorrow for wt check and is [facility] Today to pick up DEBP.  Mom declines outpt appt with lactation at this time and plans to "see how it goes"  And call if she wants to schedule appt.  Encouraged mom to use all support she can to ensure success with breastfeeding.  Mom has discharge resources.  Discussed with mom milk coming to volume and engorgement care as needed.    Patient Name: Robyn Shelly RubensteinRasherra Donado ZOXWR'UToday's Date: 03/26/2015 Reason for consult: Follow-up assessment   Maternal Data    Feeding    LATCH Score/Interventions                      Lactation Tools Discussed/Used WIC Program: Yes (plans to go today for a DEBP)   Consult Status Consult Status: Complete    Shoptaw, Arvella MerlesJana Lynn 03/26/2015, 11:24 AM

## 2015-03-26 NOTE — Discharge Summary (Signed)
Cesarean Section Delivery Discharge Summary  Robyn [name]  DOB:    January 25, 1981 MRN:    [id number] CSN:    [id number]  Date of admission:                  03/23/15  Date of discharge:                   03/26/15  Procedures this admission:    Scheduled repeat low transverse cesarean section and cerclage removal  Date of Delivery:  03/23/15  Newborn Data:  Live born female  Birth Weight: 8 lb 0.8 oz (3650 g) APGAR: 8, 9  Home with mother. Name: [name]   History of Present Illness:  Ms. Robyn [name] is a 34 y.o. female, [phone], who presents at [redacted]w[redacted]d weeks gestation. The patient has been followed at [facility]. She was admitted for cesarean section. Her pregnancy has been complicated by:  Patient Active Problem List   Diagnosis Date Noted  . S/P repeat low transverse C-section 03/23/2015  . Hypertension - on Labetalol 200 mg po bid 11/22/2014  . Diabetes mellitus type 2, uncontrolled, without complications -- dx'd in 2011 11/22/2014  . Hx of incompetent cervix, currently pregnant - cerclage placed on 10/09/14 by Dr. Su Hilt 11/22/2014  . History of preterm delivery x 2 - weekly 17P injections - first injection at 16.5 wks 11/22/2014  . Elderly multigravida 11/22/2014  . PCOS (polycystic ovarian syndrome) 08/21/2014  . Herpes 08/21/2014  . ANA positive 08/21/2014  . Previous cesarean delivery, antepartum condition or complication x 2--previous vertical incision 08/21/2014  . Obesity 05/20/2012      Delivery Outcome: Scheduled Unscheduled C/S  Admitting Dx:    Scheduled repeat cesarean section, bitalteral tubal ligation and cerclage removal Rationale for C/S: Prior Cesarean Section with Desire for Surgical SterilzationPrior Cesarean Section.H /o classical c-section presenting for scheduled repeat c-section, sterilization and removal of cerclage.  Anesthesia:        Spinal Surgeon:         Osborn Coho, MD Complications:  No bilateral tubal ligation due to scar tissue  Additional Information:  Currently under the care of endocrinologist for Type 2 diabetes mellitus.Chronic hypertension, and Hx Cesarean x 2, initial Emergent C/S low vertical incision which was closed in 3 layers in 1996 at 34wks   Intrapartum Procedures: cesarean: low cervical, transverse and cerclage removal Postpartum Procedures: none   Discharge Diagnoses: Term Pregnancy-delivered     Feeding:    breast and bottle  Contraception:  IUD  Hemoglobin Results:  CBC CBC Latest Ref Rng 03/24/2015 03/22/2015 10/02/2014  WBC 4.0 - 10.5 K/uL 14.6(H) 12.2(H) 11.3(H)  Hemoglobin 12.0 - 15.0 g/dL 11.8(L) 12.1 11.9(L)  Hematocrit 36.0 - 46.0 % 36.0 36.6 36.3  Platelets 150 - 400 K/uL 213 242 233     Discharge Physical Exam:   General: alert and cooperative Lochia: appropriate Uterine Fundus: firm Abdomen:  + bowel sounds, NT Incision: Honeycomb dressing CDI DVT Evaluation: No evidence of DVT seen on physical exam.  Discharge Information:  Activity:           pelvic rest Diet:                routine Medications: Ibuprofen and Percocept Condition:      stable Instructions:    Return to [facility] office for blood pressure check in 1 wk Pelvic Rest until 6 wk  Postpartum visit for IUD placement  [facility] [employer] nurse to follow up beginning next week   Schedule Appt with Endocrinologist ASAP Increase morning and evening Novolog Mix 70/30  by 4 units ( 49 u in AM and 39 u PM) Continue taking Labetalol 200mg  BID  Discharge to: home    Alphonzo SeveranceRachel Arjuna Doeden CNM 03/26/2015 8:08 AM

## 2015-03-26 NOTE — Discharge Instructions (Signed)
Postpartum Care After Cesarean Delivery After you deliver your newborn (postpartum period), the usual stay in the hospital is 24-72 hours. If there were problems with your labor or delivery, or if you have other medical problems, you might be in the hospital longer.  While you are in the hospital, you will receive help and instructions on how to care for yourself and your newborn during the postpartum period.  While you are in the hospital:  It is normal for you to have pain or discomfort from the incision in your abdomen. Be sure to tell your nurses when you are having pain, where the pain is located, and what makes the pain worse.  If you are breastfeeding, you may feel uncomfortable contractions of your uterus for a couple of weeks. This is normal. The contractions help your uterus get back to normal size.  It is normal to have some bleeding after delivery.  For the first 1-3 days after delivery, the flow is red and the amount may be similar to a period.  It is common for the flow to start and stop.  In the first few days, you may pass some small clots. Let your nurses know if you begin to pass large clots or your flow increases.  Do not  flush blood clots down the toilet before having the nurse look at them.  During the next 3-10 days after delivery, your flow should become more watery and pink or brown-tinged in color.  Ten to fourteen days after delivery, your flow should be a small amount of yellowish-white discharge.  The amount of your flow will decrease over the first few weeks after delivery. Your flow may stop in 6-8 weeks. Most women have had their flow stop by 12 weeks after delivery.  You should change your sanitary pads frequently.  Wash your hands thoroughly with soap and water for at least 20 seconds after changing pads, using the toilet, or before holding or feeding your newborn.  Your intravenous (IV) tubing will be removed when you are drinking enough fluids.  The  urine drainage tube (urinary catheter) that was inserted before delivery may be removed within 6-8 hours after delivery or when feeling returns to your legs. You should feel like you need to empty your bladder within the first 6-8 hours after the catheter has been removed.  In case you become weak, lightheaded, or faint, call your nurse before you get out of bed for the first time and before you take a shower for the first time.  Within the first few days after delivery, your breasts may begin to feel tender and full. This is called engorgement. Breast tenderness usually goes away within 48-72 hours after engorgement occurs. You may also notice milk leaking from your breasts. If you are not breastfeeding, do not stimulate your breasts. Breast stimulation can make your breasts produce more milk.  Spending as much time as possible with your newborn is very important. During this time, you and your newborn can feel close and get to know each other. Having your newborn stay in your room (rooming in) will help to strengthen the bond with your newborn. It will give you time to get to know your newborn and become comfortable caring for your newborn.  Your hormones change after delivery. Sometimes the hormone changes can temporarily cause you to feel sad or tearful. These feelings should not last more than a few days. If these feelings last longer than that, you should talk to your  caregiver.  If desired, talk to your caregiver about methods of family planning or contraception.  Talk to your caregiver about immunizations. Your caregiver may want you to have the following immunizations before leaving the hospital:  Tetanus, diphtheria, and pertussis (Tdap) or tetanus and diphtheria (Td) immunization. It is very important that you and your family (including grandparents) or others caring for your newborn are up-to-date with the Tdap or Td immunizations. The Tdap or Td immunization can help protect your newborn  from getting ill.  Rubella immunization.  Varicella (chickenpox) immunization.  Influenza immunization. You should receive this annual immunization if you did not receive the immunization during your pregnancy.   This information is not intended to replace advice given to you by your health care provider. Make sure you discuss any questions you have with your health care provider.   Document Released: 02/07/2012 Document Reviewed: 02/07/2012 Elsevier Interactive Patient Education 2016 [employer].  Postpartum Hypertension Postpartum hypertension is high blood pressure after pregnancy that remains higher than normal for more than two days after delivery. You may not realize that you have postpartum hypertension if your blood pressure is not being checked regularly. In some cases, postpartum hypertension will go away on its own, usually within a week of delivery. However, for some women, medical treatment is required to prevent serious complications, such as seizures or stroke. The following things can affect your blood pressure:  The type of delivery you had.  Having received IV fluids or other medicines during or after delivery. CAUSES  Postpartum hypertension may be caused by any of the following or by a combination of any of the following:  Hypertension that existed before pregnancy (chronic hypertension).  Gestational hypertension.  Preeclampsia or eclampsia.  Receiving a lot of fluid through an IV during or after delivery.  Medicines.  HELLP syndrome.  Hyperthyroidism.  Stroke.  Other rare neurological or blood disorders. In some cases, the cause may not be known. RISK FACTORS Postpartum hypertension can be related to one or more risk factors, such as:  Chronic hypertension. In some cases, this may not have been diagnosed before pregnancy.  Obesity.  Type 2 diabetes.  Kidney disease.  Family history of preeclampsia.  Other medical conditions that cause  hormonal imbalances. SIGNS AND SYMPTOMS As with all types of hypertension, postpartum hypertension may not have any symptoms. Depending on how high your blood pressure is, you may experience:  Headaches. These may be mild, moderate, or severe. They may also be steady, constant, or sudden in onset (thunderclap headache).  Visual changes.  Dizziness.  Shortness of breath.  Swelling of your hands, feet, lower legs, or face. In some cases, you may have swelling in more than one of these locations.  Heart palpitations or a racing heartbeat.  Difficulty breathing while lying down.  Decreased urination. Other rare signs and symptoms may include:  Sweating more than usual. This lasts longer than a few days after delivery.  Chest pain.  Sudden dizziness when you get up from sitting or lying down.  Seizures.  Nausea or vomiting.  Abdominal pain. DIAGNOSIS The diagnosis of postpartum hypertension is made through a combination of physical examination findings and testing of your blood and urine. You may also have additional tests, such as a CT scan or an MRI, to check for other complications of postpartum hypertension. TREATMENT When blood pressure is high enough to require treatment, your options may include:  Medicines to reduce blood pressure (antihypertensives). Tell your health care provider if  you are breastfeeding or if you plan to breastfeed. There are many antihypertensive medicines that are safe to take while breastfeeding.  Stopping medicines that may be causing hypertension.  Treating medical conditions that are causing hypertension.  Treating the complications of hypertension, such as seizures, stroke, or kidney problems. Your health care provider will also continue to monitor your blood pressure closely and repeatedly until it is within a safe range for you.  HOME CARE INSTRUCTIONS  Take medicines only as directed by your health care provider.  Get regular exercise  after your health care provider tells you that it is safe.  Follow your health care provider's recommendations on fluid and salt restrictions.  Do not use any tobacco products, including cigarettes, chewing tobacco, or electronic cigarettes. If you need help quitting, ask your health care provider.  Keep all follow-up visits as directed by your health care provider. This is important. SEEK MEDICAL CARE IF:  Your symptoms get worse.  You have new symptoms, such as:  Headache.  Dizziness.  Visual changes. SEEK IMMEDIATE MEDICAL CARE IF:  You develop a severe or sudden headache.  You have seizures.  You develop numbness or weakness on one side of your body.  You have difficulty thinking, speaking, or swallowing.  You develop severe abdominal pain.  You develop difficulty breathing, chest pain, a racing heartbeat, or heart palpitations. These symptoms may represent a serious problem that is an emergency. Do not wait to see if the symptoms will go away. Get medical help right away. Call your local emergency services (911 in the [place] Do not drive yourself to the hospital.   This information is not intended to replace advice given to you by your health care provider. Make sure you discuss any questions you have with your health care provider.   Document Released: 01/16/2014 Document Reviewed: 01/16/2014 Elsevier Interactive Patient Education [employer]2016 Elsevier Inc.

## 2015-04-17 ENCOUNTER — Encounter (HOSPITAL_COMMUNITY): Payer: Self-pay | Admitting: *Deleted

## 2015-04-17 ENCOUNTER — Inpatient Hospital Stay (HOSPITAL_COMMUNITY)
Admission: AD | Admit: 2015-04-17 | Discharge: 2015-04-18 | Disposition: A | Discharge status: Home / Self Care | Payer: Medicaid Other | Source: Ambulatory Visit | Attending: Obstetrics and Gynecology | Admitting: Obstetrics and Gynecology

## 2015-04-17 DIAGNOSIS — M549 Dorsalgia, unspecified: Secondary | ICD-10-CM | POA: Insufficient documentation

## 2015-04-17 DIAGNOSIS — E1165 Type 2 diabetes mellitus with hyperglycemia: Secondary | ICD-10-CM

## 2015-04-17 DIAGNOSIS — R519 Headache, unspecified: Secondary | ICD-10-CM

## 2015-04-17 DIAGNOSIS — Z98891 History of uterine scar from previous surgery: Secondary | ICD-10-CM

## 2015-04-17 DIAGNOSIS — IMO0001 Reserved for inherently not codable concepts without codable children: Secondary | ICD-10-CM

## 2015-04-17 DIAGNOSIS — I10 Essential (primary) hypertension: Secondary | ICD-10-CM | POA: Diagnosis not present

## 2015-04-17 DIAGNOSIS — O9089 Other complications of the puerperium, not elsewhere classified: Secondary | ICD-10-CM | POA: Diagnosis not present

## 2015-04-17 DIAGNOSIS — R51 Headache: Secondary | ICD-10-CM | POA: Diagnosis not present

## 2015-04-17 DIAGNOSIS — M791 Myalgia, unspecified site: Secondary | ICD-10-CM

## 2015-04-17 LAB — URINE MICROSCOPIC-ADD ON: RBC / HPF: NONE SEEN RBC/hpf (ref 0–5)

## 2015-04-17 LAB — URINALYSIS, ROUTINE W REFLEX MICROSCOPIC
BILIRUBIN URINE: NEGATIVE
Glucose, UA: NEGATIVE mg/dL
Ketones, ur: NEGATIVE mg/dL
LEUKOCYTES UA: NEGATIVE
NITRITE: NEGATIVE
Protein, ur: NEGATIVE mg/dL
SPECIFIC GRAVITY, URINE: 1.025 (ref 1.005–1.030)
pH: 5.5 (ref 5.0–8.0)

## 2015-04-17 LAB — GLUCOSE, CAPILLARY: GLUCOSE-CAPILLARY: 96 mg/dL (ref 65–99)

## 2015-04-17 MED ORDER — CYCLOBENZAPRINE HCL 10 MG PO TABS
10.0000 mg | ORAL_TABLET | Freq: Once | ORAL | Status: AC
  Administered 2015-04-17: 10 mg via ORAL
  Filled 2015-04-17: qty 1

## 2015-04-17 MED ORDER — CODEINE SULFATE 30 MG PO TABS: 30.0000 mg | ORAL_TABLET | Freq: Once | ORAL | Status: DC

## 2015-04-17 MED ORDER — BUTALBITAL-APAP-CAFFEINE 50-325-40 MG PO TABS
2.0000 | ORAL_TABLET | Freq: Once | ORAL | Status: AC
  Administered 2015-04-17: 2 via ORAL
  Filled 2015-04-17: qty 2

## 2015-04-17 MED ORDER — LABETALOL HCL 100 MG PO TABS
100.0000 mg | ORAL_TABLET | Freq: Once | ORAL | Status: AC
  Administered 2015-04-17: 100 mg via ORAL
  Filled 2015-04-17: qty 1

## 2015-04-17 NOTE — MAU Provider Note (Signed)
History    Robyn [name] is a 34 y.o. [id number] who s/p C/S on 03/23/2015.  Patient presents, unannounced, for back pain.  Patient states back pain has been ongoing since surgery.  Patient reports pain has increased in the last two days.  Patient denies issues with urination, constipation, or diarrhea.  Patient reports headache started yesterday and has also gradually worsened.  Patient reports that she has been taking percocet and ibuprofen with relief, but feared "getting addicted" and discontinued usage. However, patient states she took a dosage of percocet, this morning, but only had one pill.  Patient reports that she is currently breastfeeding, has adequate intake and hydration, and has had good blood sugars.  Patient further reports taking labetalol 100mg  BID since prior to delivery, but has not had 2nd dose today.   Patient Active Problem List   Diagnosis Date Noted  . S/P repeat low transverse C-section 03/23/2015  . Hypertension - on Labetalol 200 mg po bid 11/22/2014  . Diabetes mellitus type 2, uncontrolled, without complications -- dx'd in 2011 11/22/2014  . Hx of incompetent cervix, currently pregnant - cerclage placed on 10/09/14 by Dr. Su Hiltoberts 11/22/2014  . History of preterm delivery x 2 - weekly 17P injections - first injection at 16.5 wks 11/22/2014  . Elderly multigravida 11/22/2014  . PCOS (polycystic ovarian syndrome) 08/21/2014  . Herpes 08/21/2014  . ANA positive 08/21/2014  . Previous cesarean delivery, antepartum condition or complication x 2--previous vertical incision 08/21/2014  . Obesity 05/20/2012    No chief complaint on file.  HPI  OB History    Gravida Para Term Preterm AB TAB SAB Ectopic Multiple Living   5 3  3 2 1 1   0 3      Past Medical History  Diagnosis Date  . Hypertension   . Pregnancy induced hypertension   . Herpes   . Left ovarian cyst 11/29/2011  . Smoker 11/29/2011  . PCOS (polycystic ovarian syndrome)   . Diabetes mellitus without  complication (HCC)     diet controlled   . Shortness of breath dyspnea     with exertion and while pregnant  . Anxiety     h/o panic attacks    Past Surgical History  Procedure Laterality Date  . Wisdom tooth extraction    . Cesarean section      C/S x 2  . Cervical cerclage N/A 10/09/2014    Procedure: CERCLAGE CERVICAL;  Surgeon: Osborn CohoAngela Roberts, MD;  Location: [facility];  Service: Gynecology;  Laterality: N/A;  . Cesarean section with bilateral tubal ligation Bilateral 03/23/2015    Procedure: CESAREAN SECTION WITH BILATERAL TUBAL LIGATION;  Surgeon: Osborn CohoAngela Roberts, MD;  Location: [facility];  Service: Obstetrics;  Laterality: Bilateral;  Amt OK'd to move 02/25/2015    Family History  Problem Relation Age of Onset  . Hypertension Mother   . Diabetes Father   . Stroke Father   . Cancer Maternal Uncle   . Cancer Maternal Grandmother     Social History  Substance Use Topics  . Smoking status: Current Some Day Smoker -- 0.25 packs/day for 13 years    Types: Cigarettes  . Smokeless tobacco: Never Used  . Alcohol Use: No     Comment: socially and rarely    Allergies: No Known Allergies  Prescriptions prior to admission  Medication Sig Dispense Refill Last Dose  . acetaminophen (TYLENOL) 500 MG tablet Take 500 mg by mouth every 6 (six) hours as needed.   Past Month  at Unknown time  . glucose blood (ONETOUCH VERIO) test strip 1 each by Other route 4 (four) times daily -  before meals and at bedtime. And lancets 4/day 120 each 5 Taking  . ibuprofen (ADVIL,MOTRIN) 600 MG tablet Take 1 tablet (600 mg total) by mouth every 6 (six) hours as needed. 30 tablet 2   . labetalol (NORMODYNE) 200 MG tablet Take 1 tablet (200 mg total) by mouth 2 (two) times daily. 60 tablet 2 03/22/2015 at 2130  . oxyCODONE-acetaminophen (PERCOCET/ROXICET) 5-325 MG tablet Take 1 tablet by mouth every 6 (six) hours as needed for moderate pain (for pain scale 4-7). 30 tablet 0   . Prenat w/o A Vit-FeFum-FePo-FA  (CONCEPT OB) 130-92.4-1 MG CAPS Take 1 capsule by mouth daily. 30 capsule 5 03/22/2015 at Unknown time    ROS  See HPI Above Physical Exam   unknown if currently breastfeeding.  Results for orders placed or performed during the hospital encounter of 04/17/15 (from the past 24 hour(s))  Urinalysis, Routine w reflex microscopic (not at [facility])     Status: Abnormal   Collection Time: 04/17/15 10:35 PM  Result Value Ref Range   Color, Urine YELLOW YELLOW   APPearance CLEAR CLEAR   Specific Gravity, Urine 1.025 1.005 - 1.030   pH 5.5 5.0 - 8.0   Glucose, UA NEGATIVE NEGATIVE mg/dL   Hgb urine dipstick SMALL (A) NEGATIVE   Bilirubin Urine NEGATIVE NEGATIVE   Ketones, ur NEGATIVE NEGATIVE mg/dL   Protein, ur NEGATIVE NEGATIVE mg/dL   Nitrite NEGATIVE NEGATIVE   Leukocytes, UA NEGATIVE NEGATIVE  Urine microscopic-add on     Status: Abnormal   Collection Time: 04/17/15 10:35 PM  Result Value Ref Range   Squamous Epithelial / LPF 0-5 (A) NONE SEEN   WBC, UA 0-5 0 - 5 WBC/hpf   RBC / HPF NONE SEEN 0 - 5 RBC/hpf   Bacteria, UA RARE (A) NONE SEEN    Physical Exam  Constitutional: She is oriented to person, place, and time. She appears well-developed and well-nourished.  Obese  HENT:  Head: Normocephalic and atraumatic.  Eyes: EOM are normal.  Neck: Normal range of motion.  Cardiovascular: Normal rate, regular rhythm and normal heart sounds.   Respiratory: Effort normal and breath sounds normal.  GI: Soft.  Musculoskeletal: Normal range of motion.  Right Side Tenderness at Posterior Aspect of Scapula.   Neurological: She is alert and oriented to person, place, and time.    ED Course  Assessment: 34y.o. Female Muscle Tenderness/Spasms Headache   Plan: -PE as above -Discussed usage of muscle relaxant, flexeril, to assist with discomfort -Labs pending -Will reevaluate after treatment  Follow Up (2328) -Patient reports back pain has subsided, but headache has  increased -Fiorcet 2 tabs now -Reports BS has been stable; obtain now-96 -Currently taking labetalol  BID  -Await to evaluate effects of fiorcet  Follow Up (2350) -Reports headache has not gotten better -States labetalol is due now, but does not have  -Give labetalol  -Start O2 therapy -Oxycodone  now  Follow Up (0015) -Patient reports relief with O2 therapy and oxycodone dosing -Informed of need to follow up with PCP regarding back pain and headaches -Rx for percocet 2.5/325 take 1-2 tablets q 6 hrs prn, Disp 30, RF 0 -Flexeril  take 1-2 tablets q 8 hrs prn, Disp 30, RF 0 -No questions or concerns -Keep appt as scheduled: 05/03/2015 -Encouraged to call if any questions or concerns arise prior to next scheduled office  visit.  -Discharged to home in improved condition  Cherre Robins CNM, MSN 04/17/2015 10:35 PM

## 2015-04-17 NOTE — MAU Note (Signed)
Pt. Has been having back pain since she delivered in October.  Pt. States she has been dealing with the pain, but just can't take it anymore.  Pt. Also has a headache that started yesterday.

## 2015-04-18 MED ORDER — OXYCODONE-ACETAMINOPHEN 2.5-325 MG PO TABS: 1.0000 | ORAL_TABLET | Freq: Four times a day (QID) | ORAL | Status: DC | PRN

## 2015-04-18 MED ORDER — CYCLOBENZAPRINE HCL 5 MG PO TABS: 5.0000 mg | ORAL_TABLET | Freq: Three times a day (TID) | ORAL | Status: DC | PRN

## 2015-04-18 MED ORDER — OXYCODONE HCL 5 MG PO TABS
10.0000 mg | ORAL_TABLET | Freq: Once | ORAL | Status: AC
  Administered 2015-04-18: 10 mg via ORAL
  Filled 2015-04-18: qty 2

## 2015-04-18 NOTE — Discharge Instructions (Signed)
Muscle Cramps and Spasms Muscle cramps and spasms occur when a muscle or muscles tighten and you have no control over this tightening (involuntary muscle contraction). They are a common problem and can develop in any muscle. The most common place is in the calf muscles of the leg. Both muscle cramps and muscle spasms are involuntary muscle contractions, but they also have differences:   Muscle cramps are sporadic and painful. They may last a few seconds to a quarter of an hour. Muscle cramps are often more forceful and last longer than muscle spasms.  Muscle spasms may or may not be painful. They may also last just a few seconds or much longer. CAUSES  It is uncommon for cramps or spasms to be due to a serious underlying problem. In many cases, the cause of cramps or spasms is unknown. Some common causes are:   Overexertion.   Overuse from repetitive motions (doing the same thing over and over).   Remaining in a certain position for a long period of time.   Improper preparation, form, or technique while performing a sport or activity.   Dehydration.   Injury.   Side effects of some medicines.   Abnormally low levels of the salts and ions in your blood (electrolytes), especially potassium and calcium. This could happen if you are taking water pills (diuretics) or you are pregnant.  Some underlying medical problems can make it more likely to develop cramps or spasms. These include, but are not limited to:   Diabetes.   Parkinson disease.   Hormone disorders, such as thyroid problems.   Alcohol abuse.   Diseases specific to muscles, joints, and bones.   Blood vessel disease where not enough blood is getting to the muscles.  HOME CARE INSTRUCTIONS   Stay well hydrated. Drink enough water and fluids to keep your urine clear or pale yellow.  It may be helpful to massage, stretch, and relax the affected muscle.  For tight or tense muscles, use a warm towel, heating  pad, or hot shower water directed to the affected area.  If you are sore or have pain after a cramp or spasm, applying ice to the affected area may relieve discomfort.  Put ice in a plastic bag.  Place a towel between your skin and the bag.  Leave the ice on for 15-20 minutes, 03-04 times a day.  Medicines used to treat a known cause of cramps or spasms may help reduce their frequency or severity. Only take over-the-counter or prescription medicines as directed by your caregiver. SEEK MEDICAL CARE IF:  Your cramps or spasms get more severe, more frequent, or do not improve over time.  MAKE SURE YOU:   Understand these instructions.  Will watch your condition.  Will get help right away if you are not doing well or get worse.   This information is not intended to replace advice given to you by your health care provider. Make sure you discuss any questions you have with your health care provider.   Document Released: 11/04/2001 Document Revised: 09/09/2012 Document Reviewed: 05/01/2012 Elsevier Interactive Patient Education 2016 Elsevier Inc. Tension Headache A tension headache is a feeling of pain, pressure, or aching that is often felt over the front and sides of the head. The pain can be dull, or it can feel tight (constricting). Tension headaches are not normally associated with nausea or vomiting, and they do not get worse with physical activity. Tension headaches can last from 30 minutes to several days.  This is the most common type of headache. CAUSES The exact cause of this condition is not known. Tension headaches often begin after stress, anxiety, or depression. Other triggers may include:  Alcohol.  Too much caffeine, or caffeine withdrawal.  Respiratory infections, such as colds, flu, or sinus infections.  Dental problems or teeth clenching.  Fatigue.  Holding your head and neck in the same position for a long period of time, such as while using a  computer.  Smoking. SYMPTOMS Symptoms of this condition include:  A feeling of pressure around the head.  Dull, aching head pain.  Pain felt over the front and sides of the head.  Tenderness in the muscles of the head, neck, and shoulders. DIAGNOSIS This condition may be diagnosed based on your symptoms and a physical exam. Tests may be done, such as a CT scan or an MRI of your head. These tests may be done if your symptoms are severe or unusual. TREATMENT This condition may be treated with lifestyle changes and medicines to help relieve symptoms. HOME CARE INSTRUCTIONS Managing Pain  Take over-the-counter and prescription medicines only as told by your health care provider.  Lie down in a dark, quiet room when you have a headache.  If directed, apply ice to the head and neck area:  Put ice in a plastic bag.  Place a towel between your skin and the bag.  Leave the ice on for 20 minutes, 2-3 times per day.  Use a heating pad or a hot shower to apply heat to the head and neck area as told by your health care provider. Eating and Drinking  Eat meals on a regular schedule.  Limit alcohol use.  Decrease your caffeine intake, or stop using caffeine. General Instructions  Keep all follow-up visits as told by your health care provider. This is important.  Keep a headache journal to help find out what may trigger your headaches. For example, write down:  What you eat and drink.  How much sleep you get.  Any change to your diet or medicines.  Try massage or other relaxation techniques.  Limit stress.  Sit up straight, and avoid tensing your muscles.  Do not use tobacco products, including cigarettes, chewing tobacco, or e-cigarettes. If you need help quitting, ask your health care provider.  Exercise regularly as told by your health care provider.  Get 7-9 hours of sleep, or the amount recommended by your health care provider. SEEK MEDICAL CARE IF:  Your  symptoms are not helped by medicine.  You have a headache that is different from what you normally experience.  You have nausea or you vomit.  You have a fever. SEEK IMMEDIATE MEDICAL CARE IF:  Your headache becomes severe.  You have repeated vomiting.  You have a stiff neck.  You have a loss of vision.  You have problems with speech.  You have pain in your eye or ear.  You have muscular weakness or loss of muscle control.  You lose your balance or you have trouble walking.  You feel faint or you pass out.  You have confusion.   This information is not intended to replace advice given to you by your health care provider. Make sure you discuss any questions you have with your health care provider.   Document Released: 05/15/2005 Document Revised: 02/03/2015 Document Reviewed: 09/07/2014 Elsevier Interactive Patient Education [employer]2016 [employer]

## 2016-01-19 ENCOUNTER — Ambulatory Visit (INDEPENDENT_AMBULATORY_CARE_PROVIDER_SITE_OTHER): Payer: Medicaid Other | Admitting: Podiatry

## 2016-01-19 ENCOUNTER — Inpatient Hospital Stay (HOSPITAL_COMMUNITY)
Admission: AD | Admit: 2016-01-19 | Discharge: 2016-01-19 | Disposition: A | Discharge status: Home / Self Care | Payer: Medicaid Other | Source: Ambulatory Visit | Attending: Obstetrics and Gynecology | Admitting: Obstetrics and Gynecology

## 2016-01-19 ENCOUNTER — Encounter (HOSPITAL_COMMUNITY): Payer: Self-pay

## 2016-01-19 ENCOUNTER — Encounter: Payer: Self-pay | Admitting: Podiatry

## 2016-01-19 VITALS — BP 172/99 | HR 86 | Resp 16

## 2016-01-19 DIAGNOSIS — Z794 Long term (current) use of insulin: Secondary | ICD-10-CM | POA: Diagnosis not present

## 2016-01-19 DIAGNOSIS — L84 Corns and callosities: Secondary | ICD-10-CM

## 2016-01-19 DIAGNOSIS — G44209 Tension-type headache, unspecified, not intractable: Secondary | ICD-10-CM | POA: Diagnosis not present

## 2016-01-19 DIAGNOSIS — R51 Headache: Secondary | ICD-10-CM | POA: Insufficient documentation

## 2016-01-19 DIAGNOSIS — E119 Type 2 diabetes mellitus without complications: Secondary | ICD-10-CM | POA: Insufficient documentation

## 2016-01-19 DIAGNOSIS — I10 Essential (primary) hypertension: Secondary | ICD-10-CM | POA: Diagnosis not present

## 2016-01-19 DIAGNOSIS — F419 Anxiety disorder, unspecified: Secondary | ICD-10-CM | POA: Diagnosis not present

## 2016-01-19 DIAGNOSIS — Q6650 Congenital pes planus, unspecified foot: Secondary | ICD-10-CM

## 2016-01-19 DIAGNOSIS — F17210 Nicotine dependence, cigarettes, uncomplicated: Secondary | ICD-10-CM | POA: Insufficient documentation

## 2016-01-19 DIAGNOSIS — R030 Elevated blood-pressure reading, without diagnosis of hypertension: Secondary | ICD-10-CM | POA: Diagnosis present

## 2016-01-19 LAB — COMPREHENSIVE METABOLIC PANEL
ALT: 16 U/L (ref 14–54)
AST: 14 U/L — AB (ref 15–41)
Albumin: 4.2 g/dL (ref 3.5–5.0)
Alkaline Phosphatase: 62 U/L (ref 38–126)
Anion gap: 6 (ref 5–15)
BUN: 12 mg/dL (ref 6–20)
CALCIUM: 9.3 mg/dL (ref 8.9–10.3)
CO2: 26 mmol/L (ref 22–32)
CREATININE: 0.69 mg/dL (ref 0.44–1.00)
Chloride: 108 mmol/L (ref 101–111)
Glucose, Bld: 101 mg/dL — ABNORMAL HIGH (ref 65–99)
Potassium: 3.4 mmol/L — ABNORMAL LOW (ref 3.5–5.1)
SODIUM: 140 mmol/L (ref 135–145)
Total Bilirubin: 0.3 mg/dL (ref 0.3–1.2)
Total Protein: 7.9 g/dL (ref 6.5–8.1)

## 2016-01-19 LAB — POCT PREGNANCY, URINE: Preg Test, Ur: NEGATIVE

## 2016-01-19 MED ORDER — AMLODIPINE BESYLATE 5 MG PO TABS: 5.0000 mg | ORAL_TABLET | Freq: Every day | ORAL | Status: DC

## 2016-01-19 MED ORDER — AMLODIPINE BESYLATE 5 MG PO TABS
5.0000 mg | ORAL_TABLET | Freq: Every day | ORAL | Status: DC
  Administered 2016-01-19: 5 mg via ORAL
  Filled 2016-01-19 (×2): qty 1

## 2016-01-19 MED ORDER — HYDRALAZINE HCL 20 MG/ML IJ SOLN: 10.0000 mg | Freq: Once | INTRAMUSCULAR | Status: DC

## 2016-01-19 MED ORDER — AMLODIPINE BESYLATE 5 MG PO TABS: 5.0000 mg | ORAL_TABLET | Freq: Every day | ORAL | 0 refills | Status: DC

## 2016-01-19 MED ORDER — ACETAMINOPHEN 325 MG PO TABS
650.0000 mg | ORAL_TABLET | Freq: Once | ORAL | Status: AC
  Administered 2016-01-19: 650 mg via ORAL
  Filled 2016-01-19: qty 2

## 2016-01-19 NOTE — Discharge Instructions (Signed)
General Headache Without Cause °A headache is pain or discomfort felt around the head or neck area. There are many causes and types of headaches. In some cases, the cause may not be found.  °HOME CARE  °Managing Pain °· Take over-the-counter and prescription medicines only as told by your doctor. °· Lie down in a dark, quiet room when you have a headache. °· If directed, apply ice to the head and neck area: °· Put ice in a plastic bag. °· Place a towel between your skin and the bag. °· Leave the ice on for 20 minutes, 2-3 times per day. °· Use a heating pad or hot shower to apply heat to the head and neck area as told by your doctor. °· Keep lights dim if bright lights bother you or make your headaches worse. °Eating and Drinking °· Eat meals on a regular schedule. °· Lessen how much alcohol you drink. °· Lessen how much caffeine you drink, or stop drinking caffeine. °General Instructions °· Keep all follow-up visits as told by your doctor. This is important. °· Keep a journal to find out if certain things bring on headaches. For example, write down: °· What you eat and drink. °· How much sleep you get. °· Any change to your diet or medicines. °· Relax by getting a massage or doing other relaxing activities. °· Lessen stress. °· Sit up straight. Do not tighten (tense) your muscles. °· Do not use tobacco products. This includes cigarettes, chewing tobacco, or e-cigarettes. If you need help quitting, ask your doctor. °· Exercise regularly as told by your doctor. °· Get enough sleep. This often means 7-9 hours of sleep. °GET HELP IF: °· Your symptoms are not helped by medicine. °· You have a headache that feels different than the other headaches. °· You feel sick to your stomach (nauseous) or you throw up (vomit). °· You have a fever. °GET HELP RIGHT AWAY IF:  °· Your headache becomes really bad. °· You keep throwing up. °· You have a stiff neck. °· You have trouble seeing. °· You have trouble speaking. °· You have  pain in the eye or ear. °· Your muscles are weak or you lose muscle control. °· You lose your balance or have trouble walking. °· You feel like you will pass out (faint) or you pass out. °· You have confusion. °  °This information is not intended to replace advice given to you by your health care provider. Make sure you discuss any questions you have with your health care provider. °  °Document Released: 02/22/2008 Document Revised: 02/03/2015 Document Reviewed: 09/07/2014 °Elsevier Interactive Patient Education ©2016 Elsevier Inc[employer] ° °Hypertension °Hypertension is another name for high blood pressure. High blood pressure forces your heart to work harder to pump blood. A blood pressure reading has two numbers, which includes a higher number over a lower number (example: 110/72). °HOME CARE  °· Have your blood pressure rechecked by your doctor. °· Only take medicine as told by your doctor. Follow the directions carefully. The medicine does not work as well if you skip doses. Skipping doses also puts you at risk for problems. °· Do not smoke. °· Monitor your blood pressure at home as told by your doctor. °GET HELP IF: °· You think you are having a reaction to the medicine you are taking. °· You have repeat headaches or feel dizzy. °· You have puffiness (swelling) in your ankles. °· You have trouble with your vision. °GET HELP RIGHT AWAY IF:  °·   You get a very bad headache and are confused. °· You feel weak, numb, or faint. °· You get chest or belly (abdominal) pain. °· You throw up (vomit). °· You cannot breathe very well. °MAKE SURE YOU:  °· Understand these instructions. °· Will watch your condition. °· Will get help right away if you are not doing well or get worse. °  °This information is not intended to replace advice given to you by your health care provider. Make sure you discuss any questions you have with your health care provider. °  °Document Released: 11/01/2007 Document Revised: 05/20/2013 Document  Reviewed: 03/07/2013 °Elsevier Interactive Patient Education ©2016 Elsevier Inc. ° °

## 2016-01-19 NOTE — MAU Note (Signed)
Pt reports she was seen at her MD this am and her B/P was elevated 175/100, 195/109. States she has not had her B/P meds in 2 weeks.

## 2016-01-19 NOTE — Progress Notes (Signed)
   Subjective:    Patient ID: Robyn [name], female    DOB: [birth year], 35 y.o.   MRN: [id number]  HPI    Review of Systems  All other systems reviewed and are negative.      Objective:   Physical Exam        Assessment & Plan:

## 2016-01-19 NOTE — Progress Notes (Signed)
   Subjective:    Patient ID: Robyn [name], female    DOB: 07-22-1980, 35 y.o.   MRN: [id number]  HPI this patient presents to the office concerned about her feet. She says she has experience numbness on the bottom of her left foot for 4 weeks. She is also a diabetic and there is family history of diabetes. She presents the office today for an evaluation of her feet. She also has a callus under the ball of her left foot, which she has concerns. She presents the office today for an evaluation and treatment of her feet    Review of Systems  All other systems reviewed and are negative.      Objective:   Physical Exam GENERAL APPEARANCE: Alert, conversant. Appropriately groomed. No acute distress.  VASCULAR: Pedal pulses are  palpable at  [facility].  Capillary refill time is immediate to all digits,  Normal temperature gradient.  Digital hair growth is present bilateral  NEUROLOGIC: sensation is normal to 5.07 monofilament at 5/5 sites bilateral.  Light touch is intact bilateral, Muscle strength normal.  MUSCULOSKELETAL: acceptable muscle strength, tone and stability bilateral.  Intrinsic muscluature intact bilateral.  Rectus appearance of foot and digits noted bilateral. Pes planus  B/L  DERMATOLOGIC: skin color, texture, and turgor are within normal limits.  No preulcerative lesions or ulcers  are seen, no interdigital maceration noted.  No open lesions present.  Digital nails are asymptomatic. No drainage noted. Callus with fissure under ball of left foot.         Assessment & Plan:  Callus  Pes planus     IE  Told patient to use Chapstick for her fissured callus left foot. Told patient that the neuropathy. She experiences is due to her flat feet being in sandals for 4-6 months. This should resolve with regular footgear. This is not related to her diabetes foot exam was performed.  Told her to return in one year for follow-up foot exam   Helane GuntherGregory Mayer DPM

## 2016-01-19 NOTE — Progress Notes (Signed)
Pt given Norvasc and instructed to stay for 20 minutes to make sure she doesn't have any reaction. Pt refuses to stay. Pt states she has to get home.

## 2016-01-19 NOTE — MAU Provider Note (Signed)
Chief Complaint: No chief complaint on file.   SUBJECTIVE HPI: Robyn RubensteinRasherra [name] is a 35 y.o. [id number] at Unknown who presents [facility] reporting high blood pressure.   Patient noted today while she was at her podiatrist, she was told she had very elevated BPs, 175/101. [facility] BP, it was 195/109. After that, she came here. Today, she noted a HA starting this morning. Headache comes/go, at worse headache is 8/10 currently a 7/10. Has not taken anything for headache. Denies changes in vision, chest pain, SOB. Denies ataxia or paresthesias.  Patient is a known IDDM 2, on 30units BID. Not on any blood pressure meds, but has had gestational hypertension, never had known HTN outside of pregnancy. Has known PCOS. She is a smoker, about 5-6 cig/day.     Past Medical History:  Diagnosis Date  . Anxiety    h/o panic attacks  . Diabetes mellitus without complication (HCC)    diet controlled   . Herpes   . Hypertension   . Left ovarian cyst 11/29/2011  . PCOS (polycystic ovarian syndrome)   . Pregnancy induced hypertension   . Shortness of breath dyspnea    with exertion and while pregnant  . Smoker 11/29/2011   OB History  Gravida Para Term Preterm AB Living  5 3   3 2 3   SAB TAB Ectopic Multiple Live Births  1 1   0 3    # Outcome Date GA Lbr Len/2nd Weight Sex Delivery Anes PTL Lv  5 Preterm 03/23/15 6538w4d  8 lb 0.8 oz (3.65 kg) F CS-LVertical Spinal  LIV  4 Preterm 03/05/10 3461w0d    CS-Unspec   LIV  3 Preterm 01/28/95 7261w0d    CS-Unspec   LIV  2 TAB           1 SAB              Past Surgical History:  Procedure Laterality Date  . CERVICAL CERCLAGE N/A 10/09/2014   Procedure: CERCLAGE CERVICAL;  Surgeon: Osborn CohoAngela Roberts, MD;  Location: [facility];  Service: Gynecology;  Laterality: N/A;  . CESAREAN SECTION     C/S x 2  . CESAREAN SECTION WITH BILATERAL TUBAL LIGATION Bilateral 03/23/2015   Procedure: CESAREAN SECTION  WITH BILATERAL TUBAL LIGATION;  Surgeon: Osborn CohoAngela Roberts, MD;  Location: [facility];  Service: Obstetrics;  Laterality: Bilateral;  Amt OK'd to move 02/25/2015  . WISDOM TOOTH EXTRACTION     Social History   Social History  . Marital status: Married    Spouse name: N/A  . Number of children: N/A  . Years of education: N/A   Occupational History  . Not on file.   Social History Main Topics  . Smoking status: Current Some Day Smoker    Packs/day: 0.25    Years: 13.00    Types: Cigarettes  . Smokeless tobacco: Never Used  . Alcohol use No     Comment: socially and rarely  . Drug use: No  . Sexual activity: No   Other Topics Concern  . Not on file   Social History Narrative  . No narrative on file   No current facility-administered medications on file prior to encounter.    Current Outpatient Prescriptions on File Prior to Encounter  Medication Sig Dispense Refill  . glucose blood (ONETOUCH VERIO) test strip 1 each by Other route 4 (four) times daily -  before meals and at bedtime. And lancets 4/day 120  each 5  . insulin aspart protamine - aspart (NOVOLOG 70/30 MIX) (70-30) 100 UNIT/ML FlexPen 40 units daily     No Known Allergies  I have reviewed the past Medical Hx, Surgical Hx, Social Hx, Allergies and Medications.   REVIEW OF SYSTEMS  OPHTHALMIC: negative for - blurry vision, decreased vision, double vision, photophobia or scotomata RESPIRATORY: no cough, shortness of breath, or wheezing CARDIOVASCULAR: no chest pain or dyspnea on exertion GASTROINTESTINAL: no abdominal pain, change in bowel habits, or black or bloody stools negative for - epigastric or RUQ pain GENITO-URINARY: no dysuria, trouble voiding, or hematuria negative for - genital discharge, vulvar/vaginal symptoms or vaginal bleeding MUSKULOSKELETAL: negative for - gait disturbance or swelling in ankle - bilateral, foot - bilateral and leg - bilateral NEUROLOGICAL: negative for - dizziness, gait disturbance,  numbness/tingling or visual changes.  +Headache DERMATOLOGICAL: negative   OBJECTIVE Patient Vitals for the past 24 hrs:  BP Temp Temp src Pulse Resp SpO2  01/19/16 1920 184/90 98.7 F (37.1 C) Oral 80 19 98 %    PHYSICAL EXAM Constitutional: Well-developed, well-nourished female in no acute distress.  HEENT: EOMI. Atraumatic, normocephalic. Cardiovascular: normal rate Respiratory: normal rate and effort.  GI: Abd soft, non-tender, gravid appropriate for gestational age. Pos BS x 4 MS: Extremities nontender, no edema, normal ROM Neurologic: Alert and oriented x 4. CN II - XII intact. Cerebellar exams intact. No neurological deficits. GU: Neg CVAT.   LAB RESULTS Results for orders placed or performed during the hospital encounter of 01/19/16 (from the past 24 hour(s))  Pregnancy, urine POC     Status: None   Collection Time: 01/19/16  7:30 PM  Result Value Ref Range   Preg Test, Ur NEGATIVE NEGATIVE    IMAGING No results found.  MAU COURSE Labs Repeat BPs Neuro exam normal  9:25 PM - Repeat BP without meds given is 152/82. Tylenol is given.  10:44 PM - Headache is gone. Spoke with Dr. Su Hiltoberts, who is in agreement to send patient home on Amlodipine and to follow up with PCP for HTN control. If cannot get into PCP, call [facility] office for appt for BP recheck.   MDM Plan of care reviewed with patient, including labs and tests ordered and medical treatment. Reviewed signs/symptoms of stroke and heart attack, and to seek immediate medical attention. Patient is planning on seeing PCP when she is able to get appointment. Encouraged better control of diabetes as well.   ASSESSMENT 1. Essential hypertension   2. Tension-type headache, not intractable, unspecified chronicity pattern     PLAN Discharge home in stable condition. Rx given for Amlodipine 5mg  qday. Gave starting dose before discharge.    Medication List    TAKE these medications   amLODipine 5 MG  tablet Commonly known as:  NORVASC Take 1 tablet (5 mg total) by mouth daily.   glucose blood test strip Commonly known as:  ONETOUCH VERIO 1 each by Other route 4 (four) times daily -  before meals and at bedtime. And lancets 4/day   insulin aspart protamine- aspart (70-30) 100 UNIT/ML injection Commonly known as:  NOVOLOG MIX 70/30 Inject 30 Units into the skin 2 (two) times daily with a meal.        Hiram ComberElizabeth Woodland Haru Shaff, DO 01/19/2016  8:25 PM

## 2016-02-07 ENCOUNTER — Ambulatory Visit: Payer: Medicaid Other | Admitting: Endocrinology

## 2016-02-23 IMAGING — US US OB COMP LESS 14 WK
1 series · 13 of 28 positions shown · non-contrast
Comparison: None.

CLINICAL DATA: Cramping

EXAM:
OBSTETRIC <14 WK US AND TRANSVAGINAL OB US
TECHNIQUE: Both transabdominal and transvaginal ultrasound examinations were
performed for complete evaluation of the gestation as well as the
maternal uterus, adnexal regions, and pelvic cul-de-sac.
Transvaginal technique was performed to assess early pregnancy.

[Series 1: us ob comp less 14 wks · 13 of 52 slices shown]
[im 2/52]
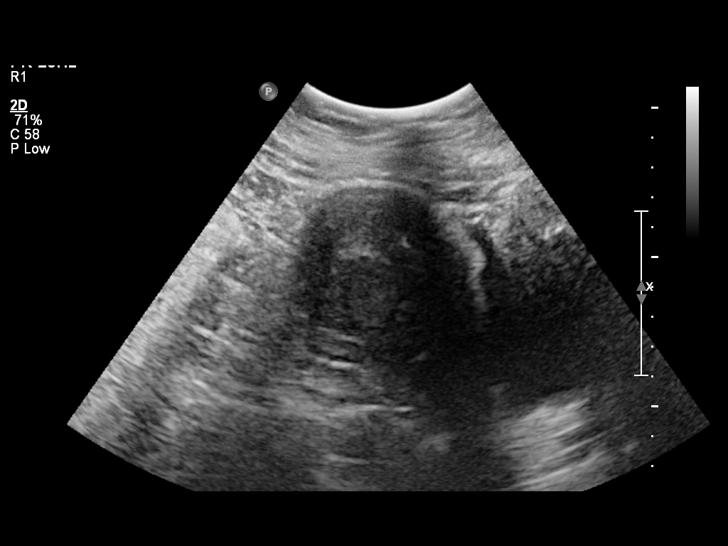
[im 6/52]
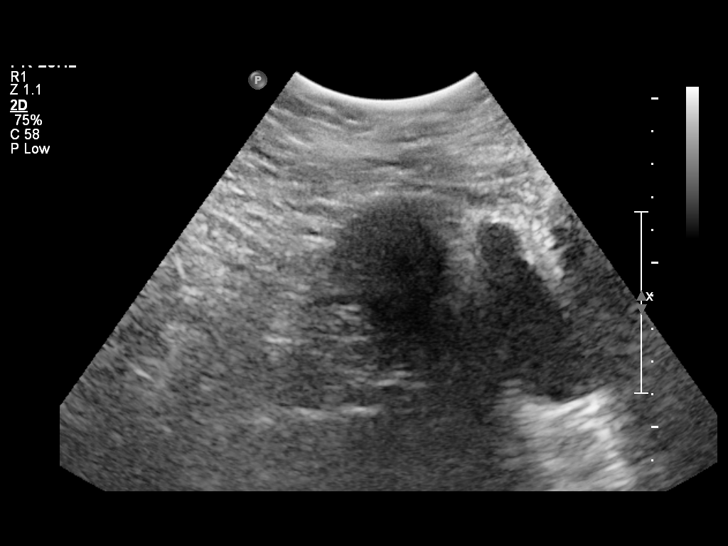
[im 10/52]
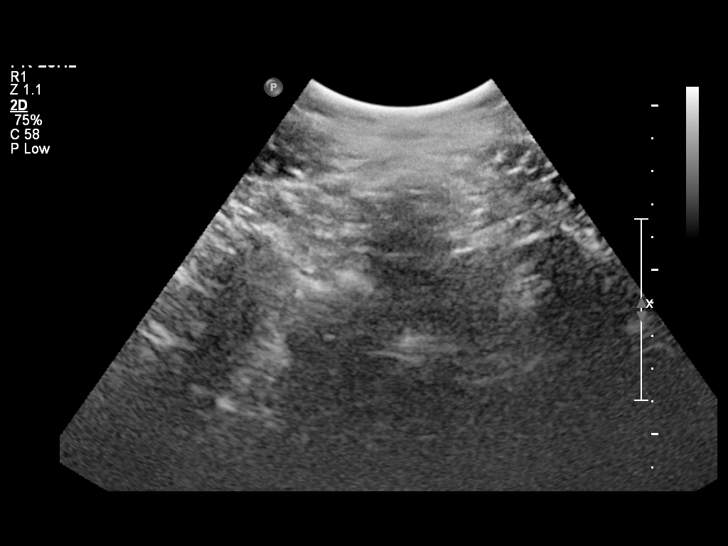
[im 14/52]
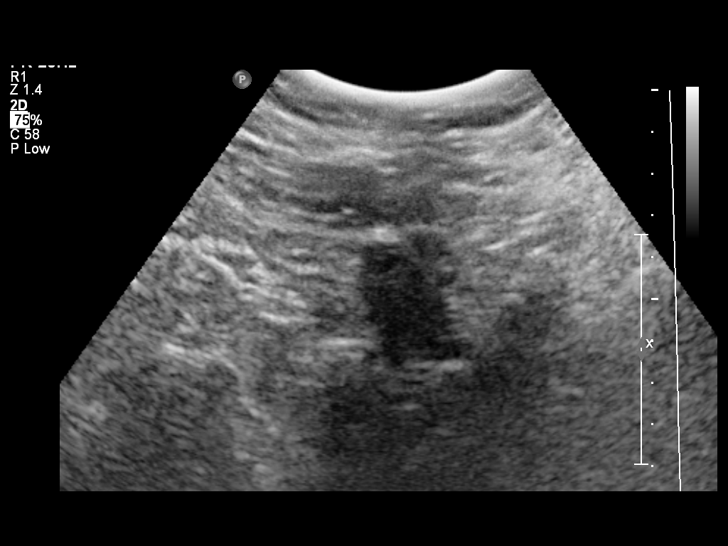
[im 18/52]
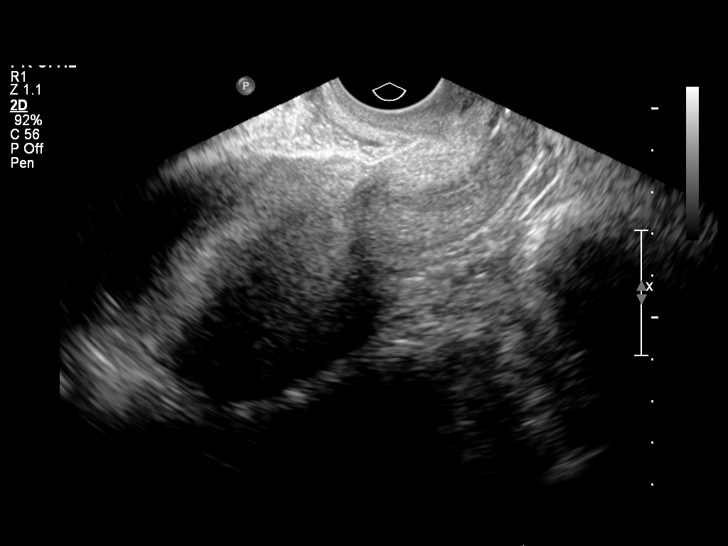
[im 21/52]
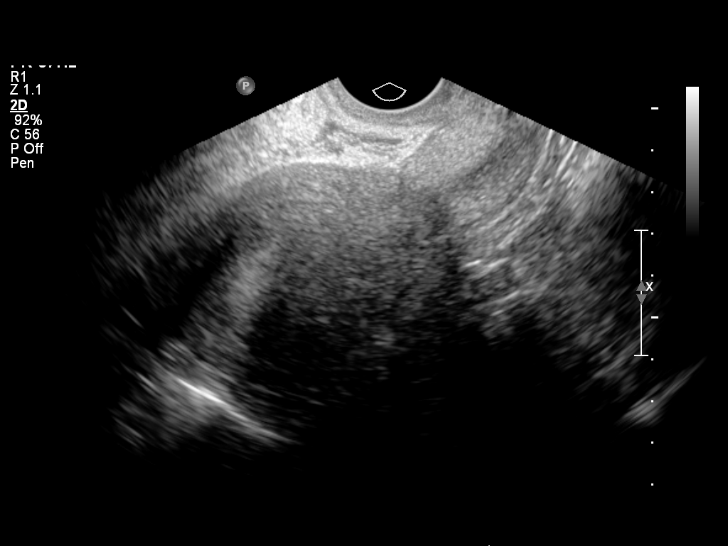
[im 27/52]
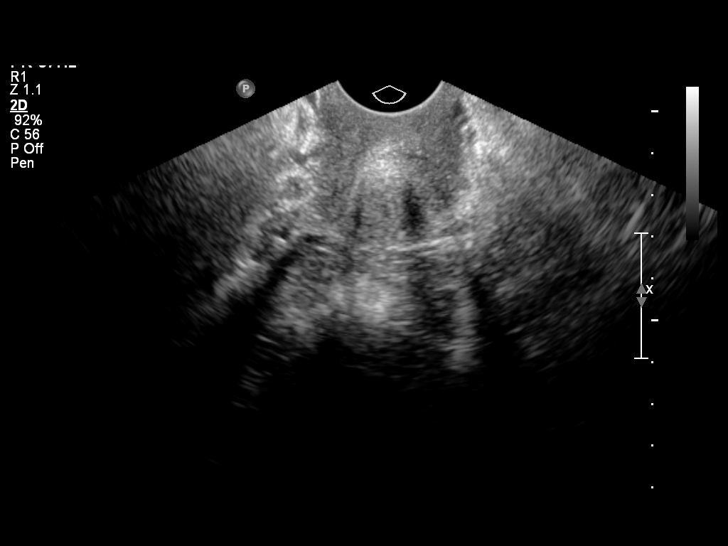
[im 31/52]
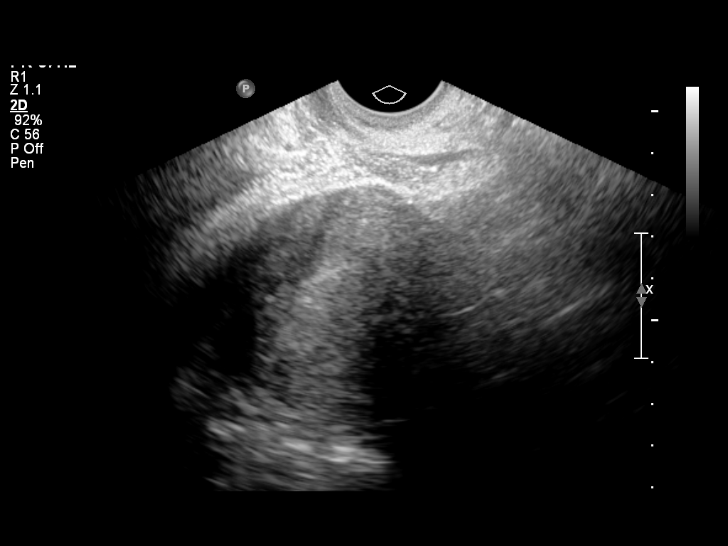
[im 35/52]
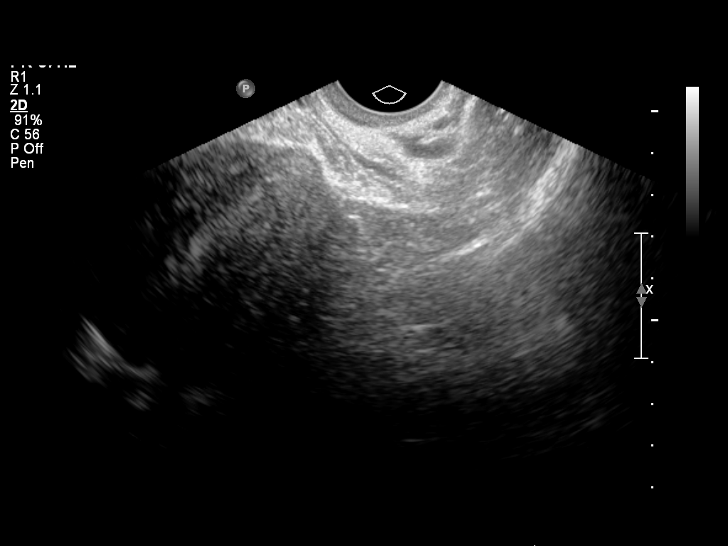
[im 38/52]
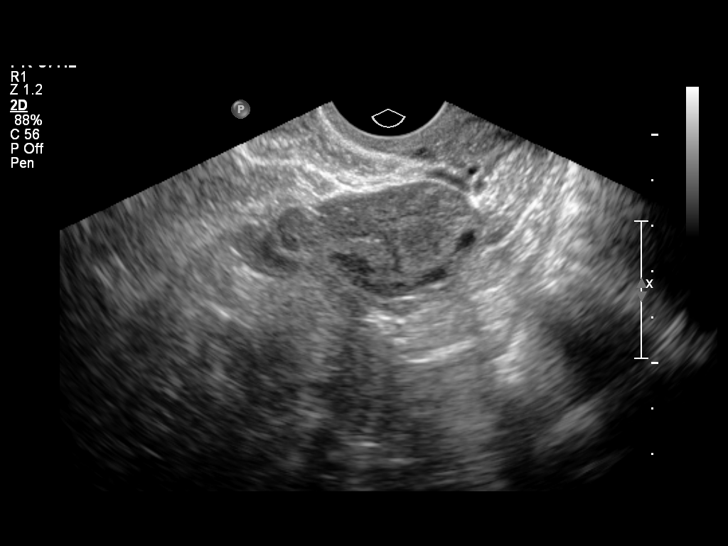
[im 42/52]
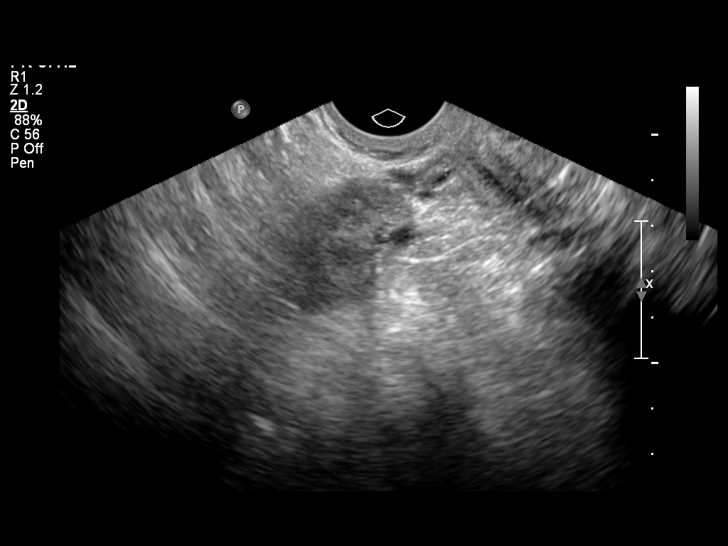
[im 46/52]
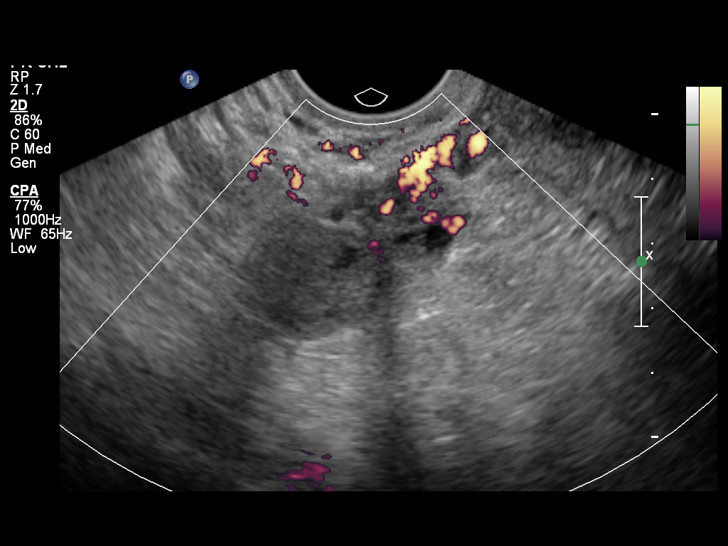
[im 50/52]
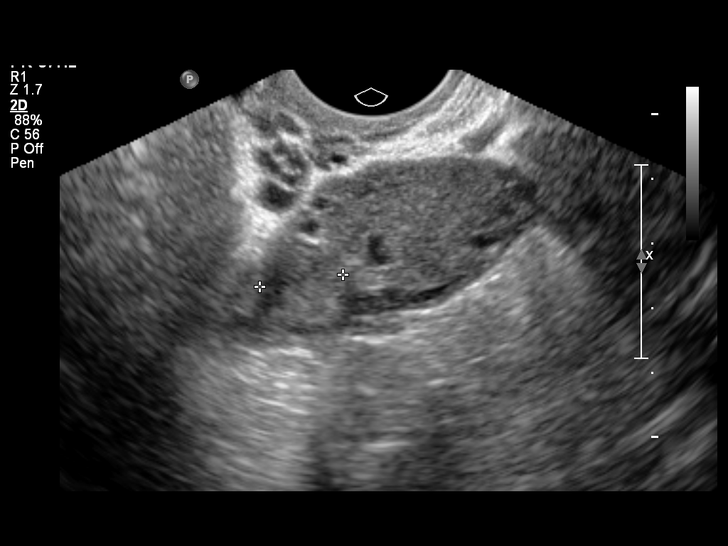

[13 of 28 positions shown; findings below may reference images not displayed]

FINDINGS: Intrauterine gestational sac: Nonvisualized.

Yolk sac:  Not applicable

Embryo:  Not applicable

Cardiac Activity: Not applicable

Heart Rate: Not applicable  bpm

Maternal uterus/adnexae: Left ovary is unremarkable. There is a
x 1.6 x 1.6 cm soft tissue but relatively avascular abnormality
abutting the right ovary.
IMPRESSION: No evidence of gestational sac or fetal pole within the uterus.

1.9 cm soft tissue abnormality abuts the right ovary. It is
difficult to determine if this is a lesion emanating from the ovary
or separate such as ectopic pregnancy.

The above findings are nonspecific and may represent early
pregnancy, spontaneous abortion, or ectopic pregnancy. The soft
tissue area abutting the right ovary does raise the possibility of
ectopic pregnancy. Serial beta HCG levels and follow-up ultrasound
are recommended. Critical Value/emergent results were called by
telephone at the time of interpretation on 08/11/2014 at [DATE] to
Dr. CHAI TIGER , who verbally acknowledged these results.

## 2016-02-28 IMAGING — US US OB TRANSVAGINAL
1 series · 13 of 28 positions shown · non-contrast
Comparison: 08/11/2014.

CLINICAL DATA: Left lower quadrant cramping pain and vaginal
spotting since 5 a.m.. Pregnant patient. Beta HCG level, 3,568 on
08/13/2014. Patient is 7 weeks and 5 days pregnant based on the last
menstrual period.

EXAM:
TRANSVAGINAL OB ULTRASOUND
TECHNIQUE: Transvaginal ultrasound was performed for complete evaluation of the
gestation as well as the maternal uterus, adnexal regions, and
pelvic cul-de-sac.

[Series 1: us ob transvaginal · 33 acquisitions, 13 frames shown]
[im 2/33]
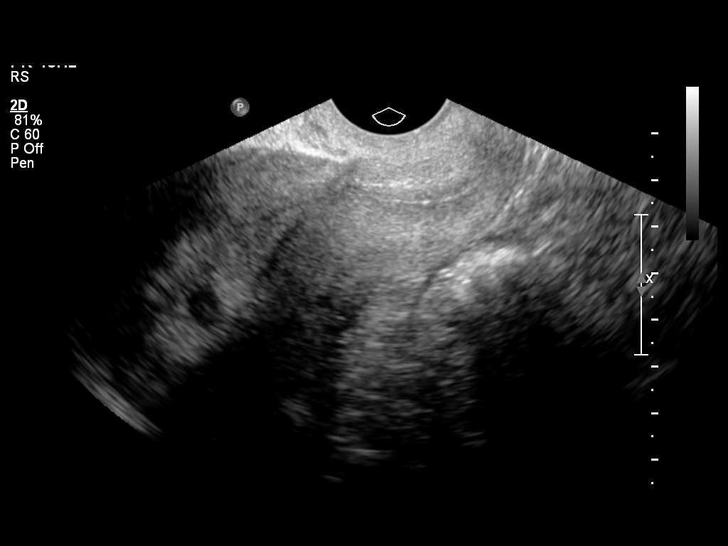
[im 4/33]
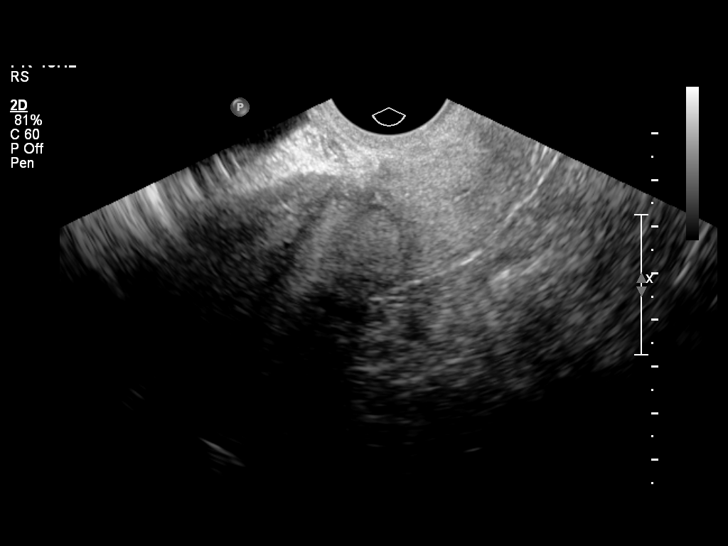
[im 6/33]
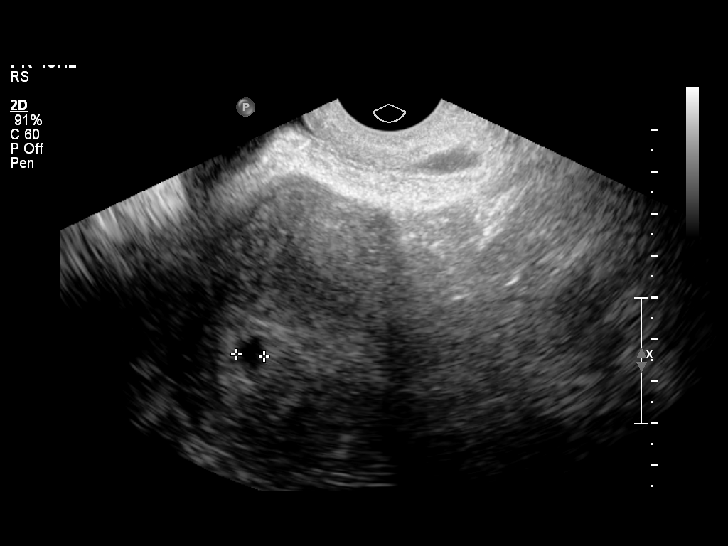
[im 9/33]
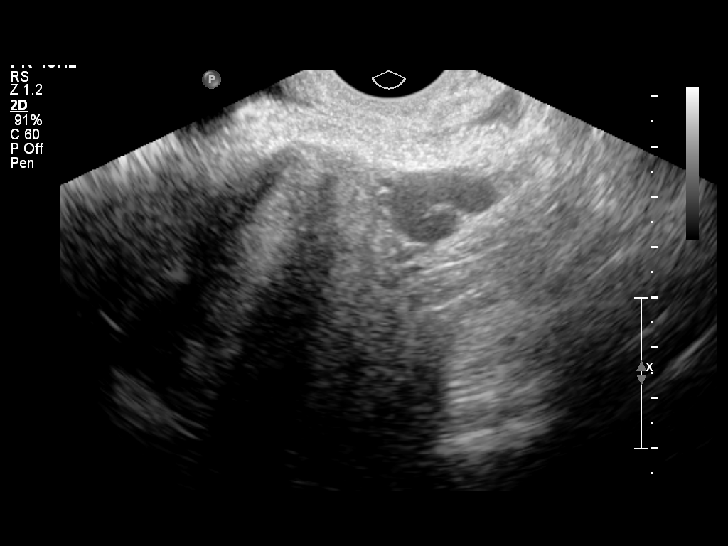
[im 11/33]
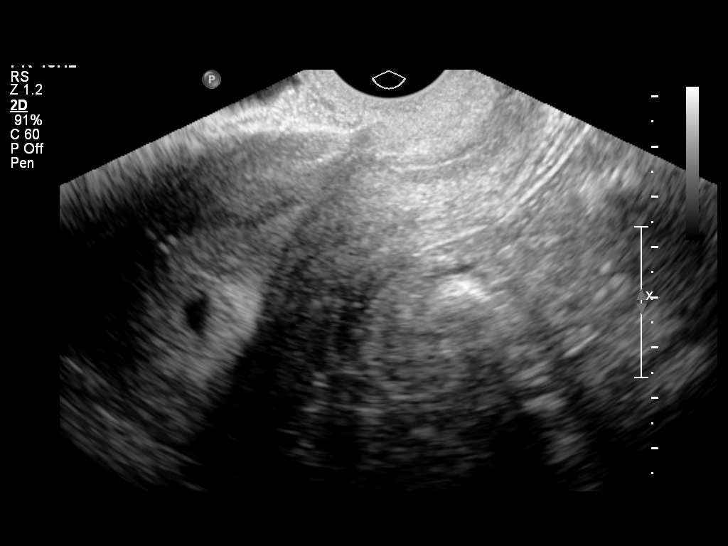
[im 14/33]
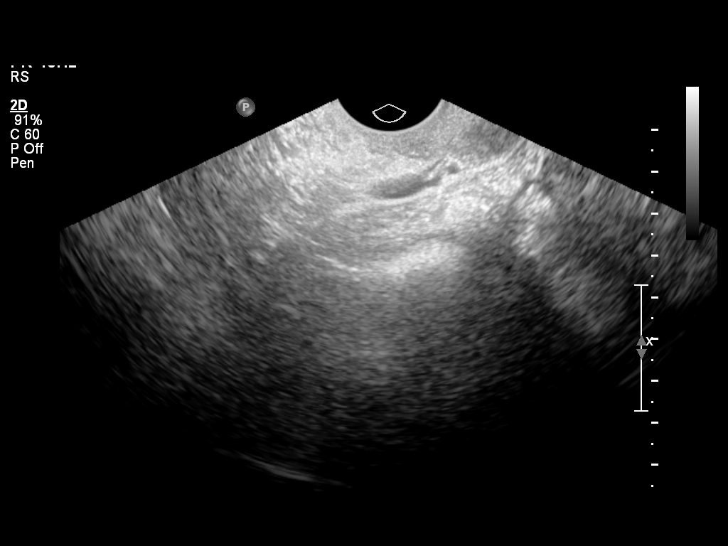
[im 17/33]
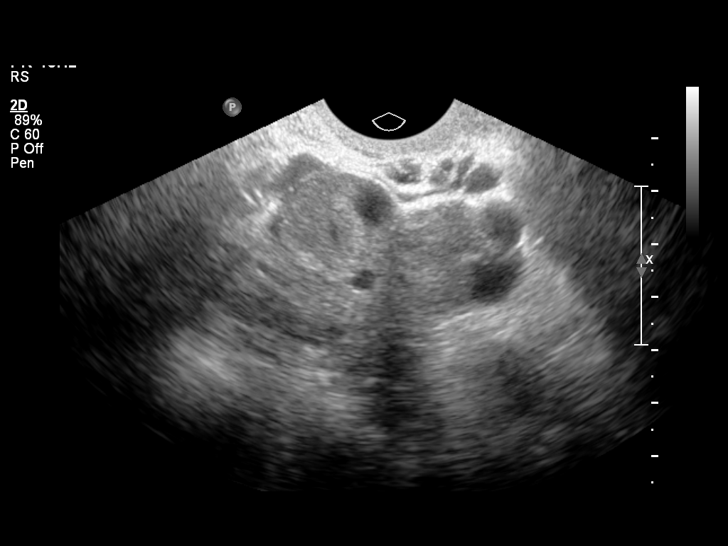
[im 19/33]
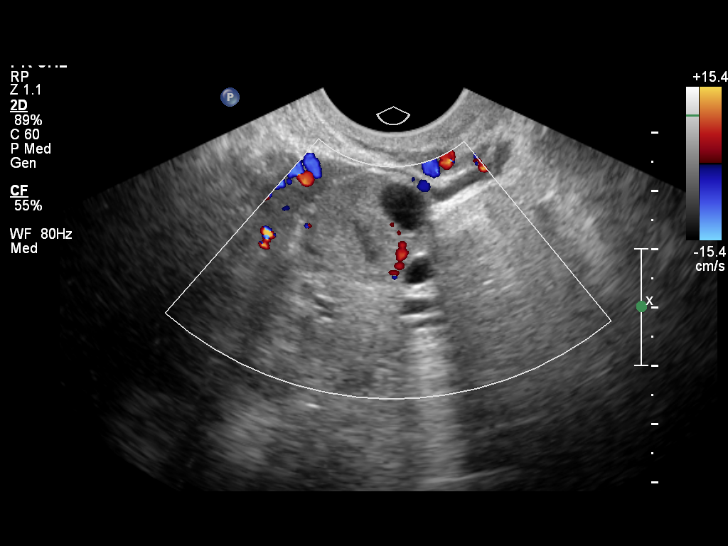
[im 22/33]
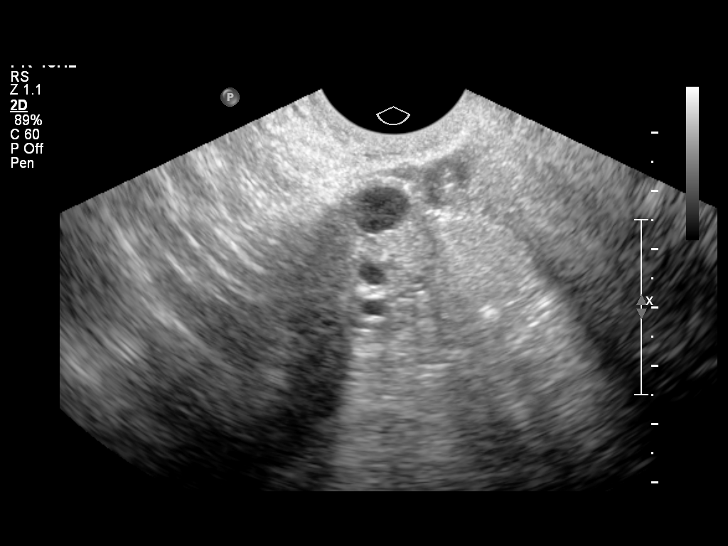
[im 24/33]
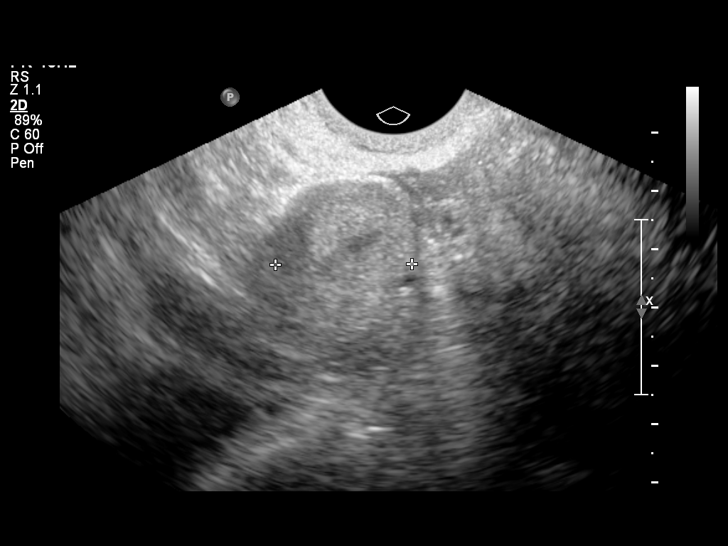
[im 27/33]
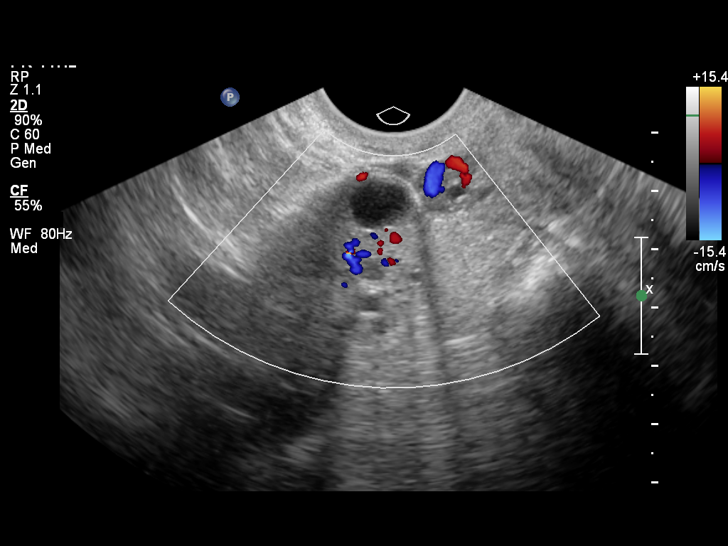
[im 29/33]
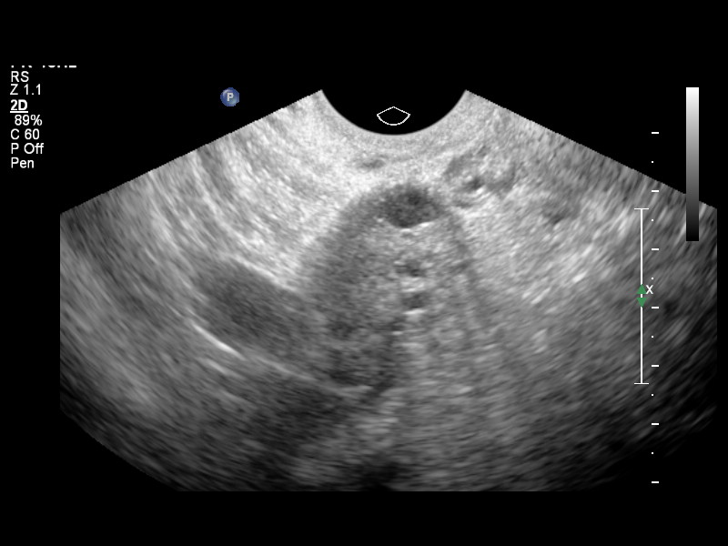
[im 31/33]
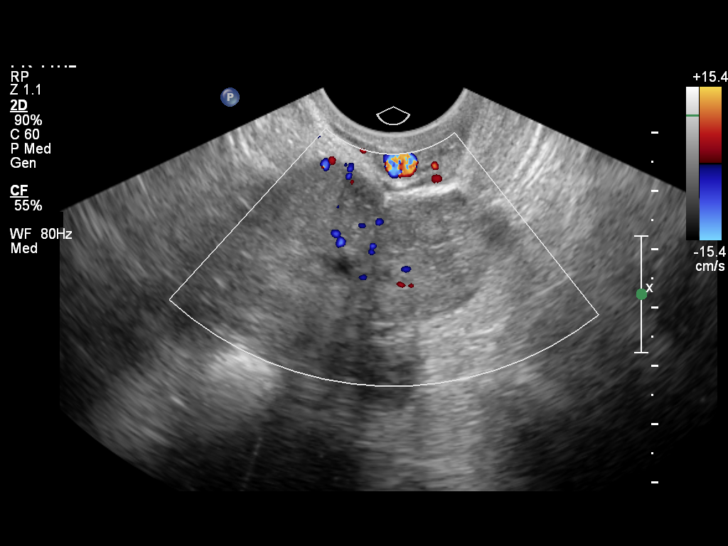

[13 of 28 positions shown; findings below may reference images not displayed]

FINDINGS: Intrauterine gestational sac: Small probable gestational sac is now
evident in the upper uterine segment.

Yolk sac:  No

Embryo:  No

Cardiac Activity: Not applicable

MSD: 7  mm   5 w   2  d

Maternal uterus/adnexae: Area soft tissue seen abutting the right
ovary on the prior study appears more consistent with an area of
normal ovarian tissue on the current exam. There is no convincing
extra ovarian mass. Right ovary measures 4.8 cm x 3.1 cm by 2.3 cm.
Left ovary not visualized. No left adnexal mass. No subchorionic
hemorrhage or uterine abnormality.
IMPRESSION: 1. Small cystic structure in the upper uterine segment is consistent
with an early intrauterine gestational sac. This supports the normal
evolution of an early intrauterine pregnancy although at this time
no yolk sac or embryo seen.
2. Soft tissue abnormality seen abutting the right ovary on the
prior study now appears more consistent with an area of normal
ovarian tissue. No convincing ectopic pregnancy.

3. Probable early intrauterine gestational sac, but no yolk sac,
fetal pole, or cardiac activity yet visualized. Recommend follow-up
quantitative B-HCG levels and follow-up US in 14 days to confirm and
assess viability. This recommendation follows SRU consensus
guidelines: Diagnostic Criteria for Nonviable Pregnancy Early in the
First Trimester. N Engl J Med 3573; [DATE].

## 2016-11-20 DIAGNOSIS — E781 Pure hyperglyceridemia: Secondary | ICD-10-CM | POA: Insufficient documentation

## 2017-05-24 ENCOUNTER — Other Ambulatory Visit: Payer: Self-pay

## 2017-05-24 ENCOUNTER — Telehealth: Payer: Self-pay

## 2017-05-24 MED ORDER — INSULIN ASPART PROT & ASPART (70-30 MIX) 100 UNIT/ML ~~LOC~~ SUSP: 40.0000 [IU] | Freq: Two times a day (BID) | SUBCUTANEOUS | 1 refills | Status: DC

## 2017-05-24 NOTE — Telephone Encounter (Signed)
I spoke with patient & she stated that she had no unusual n/v/sob. Only from her weight did she felt like she had some sob when walking up stairs.I have sent a prescription for novolog into her pharmacy for the 40 units BID.

## 2017-05-24 NOTE — Telephone Encounter (Signed)
Please verify no n/v/sob Please verify insulin is 30 units bid Then increase to 40 units bid Needs ov next available.

## 2017-05-24 NOTE — Telephone Encounter (Signed)
Patient called this morning and would like to be worked into your schedule- she stated her insulin and metformin are not working to bring her bs down and they have been running in the 300-400 range in the morning and evening morning and evening readings for yesterday were 378 before breakfast and 402 before dinner please advise on any changes that need to be made and if she can be added to your schedule

## 2017-05-24 NOTE — Telephone Encounter (Signed)
I called patient & ask her to call back to make sure she has no n/v/sob. Also to verify insulin dosages & to increase.

## 2017-08-27 ENCOUNTER — Emergency Department (HOSPITAL_COMMUNITY)
Admission: EM | Admit: 2017-08-27 | Discharge: 2017-08-27 | Disposition: A | Discharge status: Home / Self Care | Payer: Medicaid Other | Attending: Emergency Medicine | Admitting: Emergency Medicine

## 2017-08-27 ENCOUNTER — Encounter (HOSPITAL_COMMUNITY): Payer: Self-pay | Admitting: Emergency Medicine

## 2017-08-27 DIAGNOSIS — R51 Headache: Secondary | ICD-10-CM | POA: Diagnosis present

## 2017-08-27 DIAGNOSIS — I10 Essential (primary) hypertension: Secondary | ICD-10-CM | POA: Diagnosis not present

## 2017-08-27 DIAGNOSIS — Z5321 Procedure and treatment not carried out due to patient leaving prior to being seen by health care provider: Secondary | ICD-10-CM | POA: Diagnosis not present

## 2017-08-27 NOTE — ED Triage Notes (Addendum)
Per EMS, patient c/o headache and hypertension today. Denies chest pain and SOB. Reports taking BP meds as prescribed. Ambulatory. CBG 123 with EMS. Denies blurred vision and weakness.

## 2017-08-27 NOTE — ED Notes (Signed)
Pt stated to this writer that she did not want to wait any longer and said that she was going to leave

## 2017-11-05 ENCOUNTER — Telehealth: Payer: Self-pay | Admitting: Endocrinology

## 2017-11-05 ENCOUNTER — Other Ambulatory Visit: Payer: Self-pay

## 2017-11-05 MED ORDER — INSULIN LISPRO 100 UNIT/ML ~~LOC~~ SOLN: 40.0000 [IU] | Freq: Two times a day (BID) | SUBCUTANEOUS | 11 refills | Status: DC

## 2017-11-05 MED ORDER — INSULIN ASPART 100 UNIT/ML ~~LOC~~ SOLN: SUBCUTANEOUS | 11 refills | Status: DC

## 2017-11-05 NOTE — Telephone Encounter (Signed)
[facility] stated the insurance company will not cover insulin Humalog but will cover the, Novalog.  Please advise [facility] [phone] [place], [place] - [id number] [facility] [address] [facility] [phone] (Phone) [phone] (Fax)

## 2017-11-05 NOTE — Telephone Encounter (Signed)
I have changed & sent to patient's pharmacy.  

## 2017-12-03 ENCOUNTER — Emergency Department (HOSPITAL_BASED_OUTPATIENT_CLINIC_OR_DEPARTMENT_OTHER): Payer: BLUE CROSS/BLUE SHIELD

## 2017-12-03 ENCOUNTER — Other Ambulatory Visit: Payer: Self-pay

## 2017-12-03 ENCOUNTER — Encounter (HOSPITAL_BASED_OUTPATIENT_CLINIC_OR_DEPARTMENT_OTHER): Payer: Self-pay | Admitting: Emergency Medicine

## 2017-12-03 DIAGNOSIS — K649 Unspecified hemorrhoids: Secondary | ICD-10-CM | POA: Insufficient documentation

## 2017-12-03 DIAGNOSIS — R0789 Other chest pain: Secondary | ICD-10-CM | POA: Insufficient documentation

## 2017-12-03 DIAGNOSIS — E119 Type 2 diabetes mellitus without complications: Secondary | ICD-10-CM | POA: Diagnosis not present

## 2017-12-03 DIAGNOSIS — R05 Cough: Secondary | ICD-10-CM | POA: Insufficient documentation

## 2017-12-03 DIAGNOSIS — I10 Essential (primary) hypertension: Secondary | ICD-10-CM | POA: Diagnosis not present

## 2017-12-03 DIAGNOSIS — J029 Acute pharyngitis, unspecified: Secondary | ICD-10-CM | POA: Diagnosis not present

## 2017-12-03 DIAGNOSIS — F17210 Nicotine dependence, cigarettes, uncomplicated: Secondary | ICD-10-CM | POA: Insufficient documentation

## 2017-12-03 LAB — RAPID STREP SCREEN (MED CTR MEBANE ONLY): Streptococcus, Group A Screen (Direct): NEGATIVE

## 2017-12-03 NOTE — ED Triage Notes (Signed)
PT is c/o chest pain that started 3 days ago  Pain is in the center of the chest and is a dull aching pain that comes and goes  Pt states she has had cold sxs for about a month  Pt has a dry cough she states she has had for the past two weeks  Pt is c/o an aching pain in her throat  Today she states when she used the restroom she had blood in the toilet when she had a bowel movement  States it was dark red in color

## 2017-12-04 ENCOUNTER — Emergency Department (HOSPITAL_BASED_OUTPATIENT_CLINIC_OR_DEPARTMENT_OTHER)
Admission: EM | Admit: 2017-12-04 | Discharge: 2017-12-04 | Disposition: A | Discharge status: Home / Self Care | Payer: BLUE CROSS/BLUE SHIELD | Attending: Emergency Medicine | Admitting: Emergency Medicine

## 2017-12-04 DIAGNOSIS — J029 Acute pharyngitis, unspecified: Secondary | ICD-10-CM

## 2017-12-04 DIAGNOSIS — R05 Cough: Secondary | ICD-10-CM

## 2017-12-04 DIAGNOSIS — R0789 Other chest pain: Secondary | ICD-10-CM

## 2017-12-04 DIAGNOSIS — K649 Unspecified hemorrhoids: Secondary | ICD-10-CM

## 2017-12-04 DIAGNOSIS — R059 Cough, unspecified: Secondary | ICD-10-CM

## 2017-12-04 MED ORDER — ALBUTEROL SULFATE HFA 108 (90 BASE) MCG/ACT IN AERS
2.0000 | INHALATION_SPRAY | RESPIRATORY_TRACT | Status: DC | PRN
  Administered 2017-12-04: 2 via RESPIRATORY_TRACT
  Filled 2017-12-04: qty 6.7

## 2017-12-04 NOTE — ED Provider Notes (Signed)
[facility] Provider Note: Lowella DellJ. Lane Tanaia Hawkey, MD, FACEP  CSN: [id number] MRN: [id number] ARRIVAL: 12/03/17 at 2313 ROOM: [facility]06/[facility]06   CHIEF COMPLAINT  Chest Pain   HISTORY OF PRESENT ILLNESS  12/04/17 3:29 AM Hezzie BumpRasherra Neale BurlyFreeman is a 37 y.o. female who has had a cough and mild shortness of breath for about a month.  She is here with 3 days of chest pain.  The pain is located in her anterior chest and is described as a sensation like she had been hit.  The pain has come and gone over the past 3 days and is not present now.  When the pain is present it is worse with coughing.  She has also had a sore throat recently but no nasal symptoms.  Yesterday she had one episode of rectal bleeding which she describes as dark.  She denies rectal pain.   Past Medical History:  Diagnosis Date  . Anxiety    h/o panic attacks  . Diabetes mellitus without complication (HCC)    diet controlled   . Herpes   . Hypertension   . Left ovarian cyst 11/29/2011  . PCOS (polycystic ovarian syndrome)   . Pregnancy induced hypertension   . Shortness of breath dyspnea    with exertion and while pregnant  . Smoker 11/29/2011    Past Surgical History:  Procedure Laterality Date  . CERVICAL CERCLAGE N/A 10/09/2014   Procedure: CERCLAGE CERVICAL;  Surgeon: Osborn CohoAngela Roberts, MD;  Location: [facility];  Service: Gynecology;  Laterality: N/A;  . CESAREAN SECTION     C/S x 2  . CESAREAN SECTION WITH BILATERAL TUBAL LIGATION Bilateral 03/23/2015   Procedure: CESAREAN SECTION WITH BILATERAL TUBAL LIGATION;  Surgeon: Osborn CohoAngela Roberts, MD;  Location: [facility];  Service: Obstetrics;  Laterality: Bilateral;  Amt OK'd to move 02/25/2015  . WISDOM TOOTH EXTRACTION      Family History  Problem Relation Age of Onset  . Hypertension Mother   . Diabetes Father   . Stroke Father   . Cancer Maternal Grandmother   . Cancer Maternal Uncle     Social History   Tobacco Use  . Smoking status: Current Every Day Smoker   Packs/day: 0.25    Years: 13.00    Pack years: 3.25    Types: Cigarettes  . Smokeless tobacco: Never Used  Substance Use Topics  . Alcohol use: No    Comment: socially and rarely  . Drug use: No    Prior to Admission medications   Medication Sig Start Date End Date Taking? Authorizing Provider  amLODipine (NORVASC) 5 MG tablet Take 1 tablet (5 mg total) by mouth daily. 01/19/16   Mumaw, Hiram ComberElizabeth Woodland, DO  glucose blood (ONETOUCH VERIO) test strip 1 each by Other route 4 (four) times daily -  before meals and at bedtime. And lancets 4/day 01/13/15   Romero BellingEllison, Sean, MD  insulin aspart (NOVOLOG) 100 UNIT/ML injection Inject 40 units two times daily with meals. 11/05/17   Romero BellingEllison, Sean, MD    Allergies Patient has no known allergies.   REVIEW OF SYSTEMS  Negative except as noted here or in the History of Present Illness.   PHYSICAL EXAMINATION  Initial Vital Signs Blood pressure (!) 138/98, pulse 72, temperature 98.9 F (37.2 C), temperature source Oral, resp. rate (!) 9, height 5\' 8"  (1.727 m), weight 131.5 kg (290 lb), SpO2 99 %, unknown if currently breastfeeding.  Examination General: Well-developed, well-nourished female in no acute distress; appearance consistent with age of record  HENT: normocephalic; atraumatic; no pharyngeal erythema or exudate Eyes: pupils equal, round and reactive to light; extraocular muscles intact Neck: supple Heart: regular rate and rhythm Lungs: clear to auscultation bilaterally Chest: Nontender Abdomen: soft; nondistended; nontender; bowel sounds present Rectal: Nontender external hemorrhoids, one of which shows evidence of recent bleeding Extremities: No deformity; full range of motion; pulses normal Neurologic: Awake, alert and oriented; motor function intact in all extremities and symmetric; no facial droop Skin: Warm and dry Psychiatric: Normal mood and affect   RESULTS  Summary of this visit's results, reviewed by myself:   EKG  Interpretation  Date/Time:  Tuesday December 04 2017 02:05:14 EDT Ventricular Rate:  73 PR Interval:    QRS Duration: 97 QT Interval:  402 QTC Calculation: 443 R Axis:   79 Text Interpretation:  Sinus rhythm Low voltage, precordial leads Borderline T wave abnormalities Confirmed by Devion Chriscoe ([id number]) on 12/04/2017 2:30:40 AM      Laboratory Studies: Results for orders placed or performed during the hospital encounter of 12/04/17 (from the past 24 hour(s))  Rapid Strep Screen ([facility] ONLY)     Status: None   Collection Time: 12/03/17 11:37 PM  Result Value Ref Range   Streptococcus, Group A Screen (Direct) NEGATIVE NEGATIVE   Imaging Studies: Dg Chest 2 View  Result Date: 12/03/2017 CLINICAL DATA:  Cough and shortness of breath EXAM: CHEST - 2 VIEW COMPARISON:  06/09/2013 FINDINGS: Normal heart size and mediastinal contours. There is no edema, consolidation, effusion, or pneumothorax. Spondylosis. IMPRESSION: Negative. Electronically Signed   By: Marnee Spring M.D.   On: 12/03/2017 23:59    ED COURSE and MDM  Nursing notes and initial vitals signs, including pulse oximetry, reviewed.  Vitals:   12/03/17 2334 12/04/17 0205  BP: (!) 166/98 (!) 138/98  Pulse: 74 72  Resp: 20 (!) 9  Temp: 98.9 F (37.2 C)   TempSrc: Oral   SpO2: 99% 99%  Weight: 131.5 kg (290 lb)   Height: 5\' 8"  (1.727 m)    The patient's chest discomfort is not consistent with cardiac etiology.  It is reproducible with coughing and is likely due to the patient's persistent cough.  Her chest x-ray is unremarkable.  We will provide her an inhaler and demonstrated its use.   PROCEDURES    ED DIAGNOSES     ICD-10-CM   1. Cough R05   2. Atypical chest pain R07.89   3. Bleeding hemorrhoid K64.9   4. Sore throat J02.9        Laurinda Carreno, Jonny Ruiz, MD 12/04/17 [phone]

## 2017-12-06 LAB — CULTURE, GROUP A STREP (THRC)

## 2019-01-15 DIAGNOSIS — Z91148 Patient's other noncompliance with medication regimen for other reason: Secondary | ICD-10-CM | POA: Insufficient documentation

## 2019-02-01 ENCOUNTER — Emergency Department (HOSPITAL_COMMUNITY)
Admission: AD | Admit: 2019-02-01 | Discharge: 2019-02-02 | Disposition: A | Discharge status: Home / Self Care | Payer: Self-pay | Attending: Emergency Medicine | Admitting: Emergency Medicine

## 2019-02-01 ENCOUNTER — Inpatient Hospital Stay
Admission: AD | Admit: 2019-02-01 | Payer: BLUE CROSS/BLUE SHIELD | Source: Home / Self Care | Admitting: Internal Medicine

## 2019-02-01 ENCOUNTER — Encounter (HOSPITAL_COMMUNITY): Payer: Self-pay | Admitting: *Deleted

## 2019-02-01 ENCOUNTER — Other Ambulatory Visit: Payer: Self-pay

## 2019-02-01 ENCOUNTER — Inpatient Hospital Stay (HOSPITAL_COMMUNITY): Payer: Self-pay

## 2019-02-01 DIAGNOSIS — E119 Type 2 diabetes mellitus without complications: Secondary | ICD-10-CM | POA: Insufficient documentation

## 2019-02-01 DIAGNOSIS — R1032 Left lower quadrant pain: Secondary | ICD-10-CM | POA: Insufficient documentation

## 2019-02-01 DIAGNOSIS — Z3202 Encounter for pregnancy test, result negative: Secondary | ICD-10-CM | POA: Insufficient documentation

## 2019-02-01 DIAGNOSIS — I10 Essential (primary) hypertension: Secondary | ICD-10-CM | POA: Insufficient documentation

## 2019-02-01 DIAGNOSIS — Z79899 Other long term (current) drug therapy: Secondary | ICD-10-CM | POA: Insufficient documentation

## 2019-02-01 DIAGNOSIS — Z794 Long term (current) use of insulin: Secondary | ICD-10-CM | POA: Insufficient documentation

## 2019-02-01 DIAGNOSIS — F17210 Nicotine dependence, cigarettes, uncomplicated: Secondary | ICD-10-CM | POA: Insufficient documentation

## 2019-02-01 LAB — COMPREHENSIVE METABOLIC PANEL
ALT: 17 U/L (ref 0–44)
AST: 15 U/L (ref 15–41)
Albumin: 4 g/dL (ref 3.5–5.0)
Alkaline Phosphatase: 74 U/L (ref 38–126)
Anion gap: 11 (ref 5–15)
BUN: 13 mg/dL (ref 6–20)
CO2: 27 mmol/L (ref 22–32)
Calcium: 9.9 mg/dL (ref 8.9–10.3)
Chloride: 98 mmol/L (ref 98–111)
Creatinine, Ser: 0.74 mg/dL (ref 0.44–1.00)
GFR calc Af Amer: >60 mL/min (ref >60)
GFR calc non Af Amer: >60 mL/min (ref >60)
Glucose, Bld: 245 mg/dL — ABNORMAL HIGH (ref 70–99)
Potassium: 3.5 mmol/L (ref 3.5–5.1)
Sodium: 136 mmol/L (ref 135–145)
Total Bilirubin: 0.5 mg/dL (ref 0.3–1.2)
Total Protein: 8.1 g/dL (ref 6.5–8.1)

## 2019-02-01 LAB — CBC WITH DIFFERENTIAL/PLATELET
Abs Immature Granulocytes: 0.05 10*3/uL (ref 0.00–0.07)
Basophils Absolute: 0.1 10*3/uL (ref 0.0–0.1)
Basophils Relative: 1 %
Eosinophils Absolute: 0 10*3/uL (ref 0.0–0.5)
Eosinophils Relative: 0 %
HCT: 41.2 % (ref 36.0–46.0)
Hemoglobin: 13.4 g/dL (ref 12.0–15.0)
Immature Granulocytes: 0 %
Lymphocytes Relative: 18 %
Lymphs Abs: 2.3 10*3/uL (ref 0.7–4.0)
MCH: 27.4 pg (ref 26.0–34.0)
MCHC: 32.5 g/dL (ref 30.0–36.0)
MCV: 84.3 fL (ref 80.0–100.0)
Monocytes Absolute: 0.7 10*3/uL (ref 0.1–1.0)
Monocytes Relative: 6 %
Neutro Abs: 9.6 10*3/uL — ABNORMAL HIGH (ref 1.7–7.7)
Neutrophils Relative %: 75 %
Platelets: 302 10*3/uL (ref 150–400)
RBC: 4.89 MIL/uL (ref 3.87–5.11)
RDW: 14.7 % (ref 11.5–15.5)
WBC: 12.8 10*3/uL — ABNORMAL HIGH (ref 4.0–10.5)
nRBC: 0 % (ref 0.0–0.2)

## 2019-02-01 LAB — URINALYSIS, ROUTINE W REFLEX MICROSCOPIC
Bilirubin Urine: NEGATIVE
Glucose, UA: >=500 mg/dL — AB
Ketones, ur: NEGATIVE mg/dL
Nitrite: NEGATIVE
Protein, ur: NEGATIVE mg/dL
Specific Gravity, Urine: 1.018 (ref 1.005–1.030)
pH: 5 (ref 5.0–8.0)

## 2019-02-01 LAB — LIPASE, BLOOD: Lipase: 25 U/L (ref 11–51)

## 2019-02-01 LAB — POCT PREGNANCY, URINE: Preg Test, Ur: NEGATIVE

## 2019-02-01 LAB — HCG, QUANTITATIVE, PREGNANCY: hCG, Beta Chain, Quant, S: <1 m[IU]/mL (ref <5)

## 2019-02-01 MED ORDER — ONDANSETRON HCL 4 MG/2ML IJ SOLN
4.0000 mg | Freq: Once | INTRAMUSCULAR | Status: AC
Start: 2019-02-01 — End: 2019-02-01
  Administered 2019-02-01: 4 mg via INTRAVENOUS
  Filled 2019-02-01: qty 2

## 2019-02-01 MED ORDER — HYDROMORPHONE HCL 1 MG/ML IJ SOLN
1.0000 mg | Freq: Once | INTRAMUSCULAR | Status: DC
  Filled 2019-02-01: qty 1

## 2019-02-01 MED ORDER — HYDROMORPHONE HCL 1 MG/ML IJ SOLN
1.0000 mg | Freq: Once | INTRAMUSCULAR | Status: AC
  Administered 2019-02-01: 1 mg via INTRAVENOUS
  Filled 2019-02-01: qty 1

## 2019-02-01 MED ORDER — HYDROMORPHONE HCL 1 MG/ML IJ SOLN
1.0000 mg | Freq: Once | INTRAMUSCULAR | Status: AC
  Administered 2019-02-01: 1 mg via INTRAVENOUS

## 2019-02-01 NOTE — ED Provider Notes (Signed)
TIME SEEN: 11:53 PM  CHIEF COMPLAINT: Intermittent left-sided abdominal pain, flank pain  HPI: Patient is a 38 year old female with history of hypertension, diabetes, obesity, PCOS who presents to the emergency department with complaints of intermittent left back pain and left lower quadrant abdominal pain over the past week.  States his episodes will come on suddenly and be severe in nature and caused her to become diaphoretic have nausea and vomiting.  No fevers, diarrhea.  Patient was sent here by the midlevel provider at [facility].  She did have a positive pregnancy test at home this week but negative pregnancy test by urine and blood here today.  She is currently having vaginal bleeding.  No discharge.  Had a pelvic ultrasound in a year which showed mildly prominent left ovary with several small follicles.  Normal blood flow in the left ovary.  Urine did show white blood cells, trace leukocyte esterase rare bacteria.  Labs showed mild leukocytosis of 12,000 but otherwise unremarkable.  Sent here for further evaluation and possible CT scan to rule out kidney stone.  Patient denies history of previous kidney stones.  She is status post 3 C-sections.  States she is feeling better after receiving Dilaudid at [facility].  ROS: See HPI Constitutional: no fever  Eyes: no drainage  ENT: no runny nose   Cardiovascular:  no chest pain  Resp: no SOB  GI:  vomiting GU: no dysuria Integumentary: no rash  Allergy: no hives  Musculoskeletal: no leg swelling  Neurological: no slurred speech ROS otherwise negative  PAST MEDICAL HISTORY/PAST SURGICAL HISTORY:  Past Medical History:  Diagnosis Date  . Anxiety    h/o panic attacks  . Diabetes mellitus without complication (HCC)    diet controlled   . Herpes   . Hypertension   . Left ovarian cyst 11/29/2011  . PCOS (polycystic ovarian syndrome)   . Pregnancy induced hypertension   . Shortness of breath dyspnea    with exertion and while pregnant  . Smoker  11/29/2011    MEDICATIONS:  Prior to Admission medications   Medication Sig Start Date End Date Taking? Authorizing Provider  amLODipine (NORVASC) 5 MG tablet Take 1 tablet (5 mg total) by mouth daily. 01/19/16   Mumaw, Hiram ComberElizabeth Woodland, DO  glucose blood (ONETOUCH VERIO) test strip 1 each by Other route 4 (four) times daily -  before meals and at bedtime. And lancets 4/day 01/13/15   Romero BellingEllison, Sean, MD  insulin aspart (NOVOLOG) 100 UNIT/ML injection Inject 40 units two times daily with meals. 11/05/17   Romero BellingEllison, Sean, MD    ALLERGIES:  No Known Allergies  SOCIAL HISTORY:  Social History   Tobacco Use  . Smoking status: Current Every Day Smoker    Packs/day: 0.25    Years: 13.00    Pack years: 3.25    Types: Cigarettes  . Smokeless tobacco: Never Used  Substance Use Topics  . Alcohol use: No    Comment: socially and rarely    FAMILY HISTORY: Family History  Problem Relation Age of Onset  . Hypertension Mother   . Diabetes Father   . Stroke Father   . Cancer Maternal Grandmother   . Cancer Maternal Uncle     EXAM: BP (!) 153/74   Pulse 86   Temp 98.8 F (37.1 C)   Resp 18   Ht 5\' 8"  (1.727 m)   Wt 132 kg   LMP 12/16/2018   BMI 44.25 kg/m  CONSTITUTIONAL: Alert and oriented and responds appropriately to  questions. Well-appearing; well-nourished, obese HEAD: Normocephalic EYES: Conjunctivae clear, pupils appear equal, EOMI ENT: normal nose; moist mucous membranes NECK: Supple, no meningismus, no nuchal rigidity, no LAD  CARD: RRR; S1 and S2 appreciated; no murmurs, no clicks, no rubs, no gallops RESP: Normal chest excursion without splinting or tachypnea; breath sounds clear and equal bilaterally; no wheezes, no rhonchi, no rales, no hypoxia or respiratory distress, speaking full sentences ABD/GI: Normal bowel sounds; non-distended; soft, non-tender, no rebound, no guarding, no peritoneal signs, no hepatosplenomegaly BACK:  The back appears normal and is non-tender  to palpation, there is no CVA tenderness EXT: Normal ROM in all joints; non-tender to palpation; no edema; normal capillary refill; no cyanosis, no calf tenderness or swelling    SKIN: Normal color for age and race; warm; no rash NEURO: Moves all extremities equally PSYCH: The patient's mood and manner are appropriate. Grooming and personal hygiene are appropriate.  MEDICAL DECISION MAKING: Patient here with left-sided abdominal pain and flank pain.  Currently pain is been well controlled after IV Dilaudid at [facility].  Had a pelvic ultrasound that showed no significant abnormality with normal blood flow to the left ovary.  Urine [facility] did show trace leukocyte esterase, 6-10 white blood cells and rare bacteria.  Will send urine culture.  Will obtain CT of the abdomen pelvis to evaluate for ureterolithiasis.  Differential also includes diverticulitis, colitis, pyelonephritis.  Patient declines further pain medicine at this time.  ED PROGRESS: Patient CT scan shows prominent left ovary which was also seen on ultrasound.  She does describe some urinary frequency but no dysuria, gross hematuria.  Given urine did show trace leukocyte esterase and some white blood cells, will send urine culture and treat with Keflex.  No ureterolithiasis noted, hydronephrosis.  Normal appendix.  Normal-appearing bowel.  Patient states she is still having some pain.  Will give Toradol, Keflex, p.o. challenge.  Anticipate discharge home.  She is requesting something stronger for pain at home.  Will discharge with prescription of hydrocodone for several days.   At this time, I do not feel there is any life-threatening condition present. I have reviewed and discussed all results (EKG, imaging, lab, urine as appropriate) and exam findings with patient/family. I have reviewed nursing notes and appropriate previous records.  I feel the patient is safe to be discharged home without further emergent workup and can continue workup as an  outpatient as needed. Discussed usual and customary return precautions. Patient/family verbalize understanding and are comfortable with this plan.  Outpatient follow-up has been provided as needed. All questions have been answered.      Natanel Snavely, Delice Bison, DO 02/02/19 [phone]

## 2019-02-01 NOTE — ED Notes (Signed)
Pt arrived from [facility] for abd pain. Pt had a positive home pregnancy test two weeks ago; tested negative by urine and hcg quant at [facility]. Also had a US pelvis that was negative. Pain improved with dilaudid, last dose given IM at 2323

## 2019-02-01 NOTE — [facility]U Provider Note (Signed)
Chief Complaint: Abdominal Pain   First Provider Initiated Contact with Patient 02/01/19 2042      SUBJECTIVE HPI: Robyn [name] is a 38 y.o. [id number] who presents to maternity admissions reporting severe left lower quadrant pain, starting 1 week ago but becoming severe today.  She had a faintly positive pregnancy test at home 2 weeks ago but has a Nexplanon in place for contraception. Her LMP was July 20, and menses are irregular with the Nexplanon. She reports the pain is sharp in her left lower abdomen, constant but waxing and waning, and it radiates up in to her left upper abdomen and into her left lower back. It is associated with vomiting. There is no diarrhea or constipation.  She has tried Aleve and it was helping until today when nothing helps. She is rocking in pain in [facility] during assessment. There are no other symptoms.    HPI  Past Medical History:  Diagnosis Date  . Anxiety    h/o panic attacks  . Diabetes mellitus without complication (HCC)    diet controlled   . Herpes   . Hypertension   . Left ovarian cyst 11/29/2011  . PCOS (polycystic ovarian syndrome)   . Pregnancy induced hypertension   . Shortness of breath dyspnea    with exertion and while pregnant  . Smoker 11/29/2011   Past Surgical History:  Procedure Laterality Date  . CERVICAL CERCLAGE N/A 10/09/2014   Procedure: CERCLAGE CERVICAL;  Surgeon: Osborn CohoAngela Roberts, MD;  Location: [facility];  Service: Gynecology;  Laterality: N/A;  . CESAREAN SECTION     C/S x 2  . CESAREAN SECTION WITH BILATERAL TUBAL LIGATION Bilateral 03/23/2015   Procedure: CESAREAN SECTION WITH BILATERAL TUBAL LIGATION;  Surgeon: Osborn CohoAngela Roberts, MD;  Location: [facility];  Service: Obstetrics;  Laterality: Bilateral;  Amt OK'd to move 02/25/2015  . WISDOM TOOTH EXTRACTION     Social History   Socioeconomic History  . Marital status: Married    Spouse name: Not on file  . Number of children: Not on file  . Years of education: Not on file  .  Highest education level: Not on file  Occupational History  . Not on file  Social Needs  . Financial resource strain: Not on file  . Food insecurity    Worry: Not on file    Inability: Not on file  . Transportation needs    Medical: Not on file    Non-medical: Not on file  Tobacco Use  . Smoking status: Current Every Day Smoker    Packs/day: 0.25    Years: 13.00    Pack years: 3.25    Types: Cigarettes  . Smokeless tobacco: Never Used  Substance and Sexual Activity  . Alcohol use: No    Comment: socially and rarely  . Drug use: No  . Sexual activity: Never    Partners: Male    Birth control/protection: None  Lifestyle  . Physical activity    Days per week: Not on file    Minutes per session: Not on file  . Stress: Not on file  Relationships  . Social Musicianconnections    Talks on phone: Not on file    Gets together: Not on file    Attends religious service: Not on file    Active member of club or organization: Not on file    Attends meetings of clubs or organizations: Not on file    Relationship status: Not on file  . Intimate partner violence  Fear of current or ex partner: Not on file    Emotionally abused: Not on file    Physically abused: Not on file    Forced sexual activity: Not on file  Other Topics Concern  . Not on file  Social History Narrative  . Not on file   No current facility-administered medications on file prior to encounter.    Current Outpatient Medications on File Prior to Encounter  Medication Sig Dispense Refill  . amLODipine (NORVASC) 5 MG tablet Take 1 tablet (5 mg total) by mouth daily. 30 tablet 0  . glucose blood (ONETOUCH VERIO) test strip 1 each by Other route 4 (four) times daily -  before meals and at bedtime. And lancets 4/day 120 each 5  . insulin aspart (NOVOLOG) 100 UNIT/ML injection Inject 40 units two times daily with meals. 10 mL 11   No Known Allergies  ROS:  Review of Systems  Constitutional: Negative for chills, fatigue  and fever.  Respiratory: Negative for shortness of breath.   Cardiovascular: Negative for chest pain.  Gastrointestinal: Positive for abdominal pain, nausea and vomiting. Negative for constipation and diarrhea.  Genitourinary: Positive for pelvic pain. Negative for difficulty urinating, dysuria, flank pain, vaginal bleeding, vaginal discharge and vaginal pain.  Musculoskeletal: Positive for back pain.  Neurological: Negative for dizziness and headaches.  Psychiatric/Behavioral: Negative.      I have reviewed patient's Past Medical Hx, Surgical Hx, Family Hx, Social Hx, medications and allergies.   Physical Exam   Patient Vitals for the past 24 hrs:  BP Temp Pulse Resp Height Weight  02/01/19 1951 - - - - - 132 kg  02/01/19 1949 (!) 153/74 98.8 F (37.1 C) 86 18 5\' 8"  (1.727 m) -   Constitutional: Well-developed, well-nourished female in no acute distress.  Cardiovascular: normal rate Respiratory: normal effort GI: Abd soft, non-tender. Pos BS x 4 MS: Extremities nontender, no edema, normal ROM Neurologic: Alert and oriented x 4.  GU:    LAB RESULTS Results for orders placed or performed during the hospital encounter of 02/01/19 (from the past 24 hour(s))  Pregnancy, urine POC     Status: None   Collection Time: 02/01/19  8:08 PM  Result Value Ref Range   Preg Test, Ur NEGATIVE NEGATIVE  Urinalysis, Routine w reflex microscopic     Status: Abnormal   Collection Time: 02/01/19  8:15 PM  Result Value Ref Range   Color, Urine YELLOW YELLOW   APPearance CLEAR CLEAR   Specific Gravity, Urine 1.018 1.005 - 1.030   pH 5.0 5.0 - 8.0   Glucose, UA >=500 (A) NEGATIVE mg/dL   Hgb urine dipstick MODERATE (A) NEGATIVE   Bilirubin Urine NEGATIVE NEGATIVE   Ketones, ur NEGATIVE NEGATIVE mg/dL   Protein, ur NEGATIVE NEGATIVE mg/dL   Nitrite NEGATIVE NEGATIVE   Leukocytes,Ua TRACE (A) NEGATIVE   RBC / HPF 0-5 0 - 5 RBC/hpf   WBC, UA 6-10 0 - 5 WBC/hpf   Bacteria, UA RARE (A) NONE  SEEN   Squamous Epithelial / LPF 0-5 0 - 5   Mucus PRESENT   hCG, quantitative, pregnancy     Status: None   Collection Time: 02/01/19  8:54 PM  Result Value Ref Range   hCG, Beta Chain, Quant, S <1 <5 mIU/mL  CBC with Differential     Status: Abnormal   Collection Time: 02/01/19  8:54 PM  Result Value Ref Range   WBC 12.8 (H) 4.0 - 10.5 K/uL   RBC  4.89 3.87 - 5.11 MIL/uL   Hemoglobin 13.4 12.0 - 15.0 g/dL   HCT 62.0 35.5 - 97.4 %   MCV 84.3 80.0 - 100.0 fL   MCH 27.4 26.0 - 34.0 pg   MCHC 32.5 30.0 - 36.0 g/dL   RDW 16.3 84.5 - 36.4 %   Platelets 302 150 - 400 K/uL   nRBC 0.0 0.0 - 0.2 %   Neutrophils Relative % 75 %   Neutro Abs 9.6 (H) 1.7 - 7.7 K/uL   Lymphocytes Relative 18 %   Lymphs Abs 2.3 0.7 - 4.0 K/uL   Monocytes Relative 6 %   Monocytes Absolute 0.7 0.1 - 1.0 K/uL   Eosinophils Relative 0 %   Eosinophils Absolute 0.0 0.0 - 0.5 K/uL   Basophils Relative 1 %   Basophils Absolute 0.1 0.0 - 0.1 K/uL   Immature Granulocytes 0 %   Abs Immature Granulocytes 0.05 0.00 - 0.07 K/uL  Comprehensive metabolic panel     Status: Abnormal   Collection Time: 02/01/19  8:54 PM  Result Value Ref Range   Sodium 136 135 - 145 mmol/L   Potassium 3.5 3.5 - 5.1 mmol/L   Chloride 98 98 - 111 mmol/L   CO2 27 22 - 32 mmol/L   Glucose, Bld 245 (H) 70 - 99 mg/dL   BUN 13 6 - 20 mg/dL   Creatinine, Ser 6.80 0.44 - 1.00 mg/dL   Calcium 9.9 8.9 - 32.1 mg/dL   Total Protein 8.1 6.5 - 8.1 g/dL   Albumin 4.0 3.5 - 5.0 g/dL   AST 15 15 - 41 U/L   ALT 17 0 - 44 U/L   Alkaline Phosphatase 74 38 - 126 U/L   Total Bilirubin 0.5 0.3 - 1.2 mg/dL   GFR calc non Af Amer >60 >60 mL/min   GFR calc Af Amer >60 >60 mL/min   Anion gap 11 5 - 15  Lipase, blood     Status: None   Collection Time: 02/01/19  8:54 PM  Result Value Ref Range   Lipase 25 11 - 51 U/L       IMAGING US Pelvic Complete With Transvaginal  Result Date: 02/01/2019 CLINICAL DATA:  Acute left lower quadrant pain EXAM:  TRANSABDOMINAL AND TRANSVAGINAL ULTRASOUND OF PELVIS TECHNIQUE: Both transabdominal and transvaginal ultrasound examinations of the pelvis were performed. Transabdominal technique was performed for global imaging of the pelvis including uterus, ovaries, adnexal regions, and pelvic cul-de-sac. It was necessary to proceed with endovaginal exam following the transabdominal exam to visualize the uterus, endometrium, ovaries and adnexa. COMPARISON:  None FINDINGS: Uterus Measurements: 9.6 x 4.5 x 5.0 cm = volume: 114 mL. No fibroids or other mass visualized. Endometrium Thickness: 8 mm in thickness. Small echogenic foci, likely endometrial calcifications. Right ovary Measurements: 3.3 x 2.6 x 2.6 cm = volume: 11.8 mL. Normal appearance/no adnexal mass. Left ovary Measurements: 5.9 x 4.0 x 4.6 cm = volume: 54.3 mL. Left ovary is enlarged but demonstrates normal color blood flow. Small follicles. No adnexal mass. Other findings No abnormal free fluid. IMPRESSION: Mildly prominent left ovary several small follicles. Normal color blood flow within the left ovary. No acute findings. Electronically Signed   By: Charlett Nose M.D.   On: 02/01/2019 22:40    [facility] Management/MDM: Orders Placed This Encounter  Procedures  . US PELVIC COMPLETE WITH TRANSVAGINAL  . Urinalysis, Routine w reflex microscopic  . hCG, quantitative, pregnancy  . CBC with Differential  . Comprehensive metabolic panel  .  Lipase, blood  . Pregnancy, urine POC    Meds ordered this encounter  Medications  . HYDROmorphone (DILAUDID) injection 1 mg  . ondansetron (ZOFRAN) injection 4 mg    Pt with negative urine pregnancy test in [facility] but positive home test so quant hcg ordered. Since pt in severe pain, work up started in [facility] while pregnancy status pending.  Consult Dr Vergie LivingPickens to discuss assessment and findings and labs and pelvic US ordered. Pt given IV Dilaudid 1 mg and Zofran 4 mg.  When quant hcg returned as <1 and pt not pregnant, call made  to ED provider, Dr Lockie Molauratolo who accepts pt to transfer to ED for further evaluation. Pt likely needs CT to evaluate for renal calculi given hematuria.  Pt stable at time of transfer.    ASSESSMENT 1. Hematuria, unspecified type   2. Acute left lower quadrant pain   3. Negative pregnancy test     PLAN Transfer to ED for further work up/CT as needed   Sharen CounterLisa Leftwich-Kirby Certified Nurse-Midwife 02/01/2019  10:46 PM

## 2019-02-01 NOTE — [facility]U Note (Signed)
Pt reports she had faint positive HPT about 2 weeks ago. C/O LLQ pain that started last Sunday. Pain has been getting worse. Started having vomiting today. Pt rc/o vaginal bleeding that started 2 days ago. Pt has Implanon in place.

## 2019-02-02 ENCOUNTER — Emergency Department (HOSPITAL_COMMUNITY): Payer: Self-pay

## 2019-02-02 MED ORDER — HYDROCODONE-ACETAMINOPHEN 5-325 MG PO TABS: 1.0000 | ORAL_TABLET | ORAL | 0 refills | Status: DC | PRN

## 2019-02-02 MED ORDER — ONDANSETRON 4 MG PO TBDP: 4.0000 mg | ORAL_TABLET | Freq: Four times a day (QID) | ORAL | 0 refills | Status: DC | PRN

## 2019-02-02 MED ORDER — CEPHALEXIN 250 MG PO CAPS
500.0000 mg | ORAL_CAPSULE | Freq: Once | ORAL | Status: AC
  Administered 2019-02-02: 500 mg via ORAL
  Filled 2019-02-02: qty 2

## 2019-02-02 MED ORDER — KETOROLAC TROMETHAMINE 30 MG/ML IJ SOLN
30.0000 mg | Freq: Once | INTRAMUSCULAR | Status: AC
  Administered 2019-02-02: 02:00:00 30 mg via INTRAVENOUS
  Filled 2019-02-02: qty 1

## 2019-02-02 MED ORDER — IBUPROFEN 800 MG PO TABS: 800.0000 mg | ORAL_TABLET | Freq: Three times a day (TID) | ORAL | 0 refills | Status: DC | PRN

## 2019-02-02 NOTE — Discharge Instructions (Signed)
Your urine showed signs that you could have an early urinary tract infection.  Your ultrasound and CT scan showed that your left ovary was large but there were no cysts to your ovary and there was normal blood flow to the ovary.  Your CT scan showed no kidney stones, no appendicitis, no other abnormality within your abdomen and pelvis.  I recommend follow-up with your primary care physician if symptoms are not improving.

## 2019-02-03 LAB — URINE CULTURE

## 2019-02-06 ENCOUNTER — Ambulatory Visit: Payer: Medicaid Other | Admitting: Cardiology

## 2019-02-09 ENCOUNTER — Inpatient Hospital Stay (HOSPITAL_COMMUNITY): Payer: Medicaid Other

## 2019-02-09 ENCOUNTER — Other Ambulatory Visit: Payer: Self-pay

## 2019-02-09 ENCOUNTER — Emergency Department (HOSPITAL_BASED_OUTPATIENT_CLINIC_OR_DEPARTMENT_OTHER): Payer: Medicaid Other

## 2019-02-09 ENCOUNTER — Encounter (HOSPITAL_BASED_OUTPATIENT_CLINIC_OR_DEPARTMENT_OTHER): Payer: Self-pay | Admitting: *Deleted

## 2019-02-09 ENCOUNTER — Inpatient Hospital Stay (HOSPITAL_BASED_OUTPATIENT_CLINIC_OR_DEPARTMENT_OTHER)
Admission: EM | Admit: 2019-02-09 | Discharge: 2019-02-14 | DRG: 392 | Disposition: A | Discharge status: Home / Self Care | Payer: Medicaid Other | Attending: Internal Medicine | Admitting: Internal Medicine

## 2019-02-09 DIAGNOSIS — E1165 Type 2 diabetes mellitus with hyperglycemia: Secondary | ICD-10-CM | POA: Diagnosis present

## 2019-02-09 DIAGNOSIS — F17210 Nicotine dependence, cigarettes, uncomplicated: Secondary | ICD-10-CM | POA: Diagnosis present

## 2019-02-09 DIAGNOSIS — Z823 Family history of stroke: Secondary | ICD-10-CM

## 2019-02-09 DIAGNOSIS — E669 Obesity, unspecified: Secondary | ICD-10-CM | POA: Diagnosis present

## 2019-02-09 DIAGNOSIS — E876 Hypokalemia: Secondary | ICD-10-CM | POA: Diagnosis present

## 2019-02-09 DIAGNOSIS — Z833 Family history of diabetes mellitus: Secondary | ICD-10-CM

## 2019-02-09 DIAGNOSIS — Z8249 Family history of ischemic heart disease and other diseases of the circulatory system: Secondary | ICD-10-CM | POA: Diagnosis not present

## 2019-02-09 DIAGNOSIS — Z79891 Long term (current) use of opiate analgesic: Secondary | ICD-10-CM | POA: Diagnosis not present

## 2019-02-09 DIAGNOSIS — K5792 Diverticulitis of intestine, part unspecified, without perforation or abscess without bleeding: Secondary | ICD-10-CM

## 2019-02-09 DIAGNOSIS — Z79899 Other long term (current) drug therapy: Secondary | ICD-10-CM

## 2019-02-09 DIAGNOSIS — Z6841 Body Mass Index (BMI) 40.0 and over, adult: Secondary | ICD-10-CM

## 2019-02-09 DIAGNOSIS — I10 Essential (primary) hypertension: Secondary | ICD-10-CM | POA: Diagnosis present

## 2019-02-09 DIAGNOSIS — F419 Anxiety disorder, unspecified: Secondary | ICD-10-CM | POA: Diagnosis present

## 2019-02-09 DIAGNOSIS — Z20828 Contact with and (suspected) exposure to other viral communicable diseases: Secondary | ICD-10-CM | POA: Diagnosis present

## 2019-02-09 DIAGNOSIS — E282 Polycystic ovarian syndrome: Secondary | ICD-10-CM | POA: Diagnosis present

## 2019-02-09 DIAGNOSIS — E785 Hyperlipidemia, unspecified: Secondary | ICD-10-CM | POA: Diagnosis present

## 2019-02-09 DIAGNOSIS — K5720 Diverticulitis of large intestine with perforation and abscess without bleeding: Principal | ICD-10-CM | POA: Diagnosis present

## 2019-02-09 DIAGNOSIS — N739 Female pelvic inflammatory disease, unspecified: Secondary | ICD-10-CM | POA: Diagnosis present

## 2019-02-09 DIAGNOSIS — Z98891 History of uterine scar from previous surgery: Secondary | ICD-10-CM | POA: Diagnosis not present

## 2019-02-09 DIAGNOSIS — Z794 Long term (current) use of insulin: Secondary | ICD-10-CM

## 2019-02-09 DIAGNOSIS — Z791 Long term (current) use of non-steroidal anti-inflammatories (NSAID): Secondary | ICD-10-CM | POA: Diagnosis not present

## 2019-02-09 DIAGNOSIS — R109 Unspecified abdominal pain: Secondary | ICD-10-CM | POA: Diagnosis present

## 2019-02-09 DIAGNOSIS — IMO0001 Reserved for inherently not codable concepts without codable children: Secondary | ICD-10-CM | POA: Diagnosis present

## 2019-02-09 DIAGNOSIS — L0291 Cutaneous abscess, unspecified: Secondary | ICD-10-CM

## 2019-02-09 LAB — COMPREHENSIVE METABOLIC PANEL
ALT: 13 U/L (ref 0–44)
AST: 10 U/L — ABNORMAL LOW (ref 15–41)
Albumin: 3.1 g/dL — ABNORMAL LOW (ref 3.5–5.0)
Alkaline Phosphatase: 71 U/L (ref 38–126)
Anion gap: 13 (ref 5–15)
BUN: 7 mg/dL (ref 6–20)
CO2: 28 mmol/L (ref 22–32)
Calcium: 9.1 mg/dL (ref 8.9–10.3)
Chloride: 91 mmol/L — ABNORMAL LOW (ref 98–111)
Creatinine, Ser: 0.65 mg/dL (ref 0.44–1.00)
GFR calc Af Amer: >60 mL/min (ref >60)
GFR calc non Af Amer: >60 mL/min (ref >60)
Glucose, Bld: 195 mg/dL — ABNORMAL HIGH (ref 70–99)
Potassium: 3 mmol/L — ABNORMAL LOW (ref 3.5–5.1)
Sodium: 132 mmol/L — ABNORMAL LOW (ref 135–145)
Total Bilirubin: 0.4 mg/dL (ref 0.3–1.2)
Total Protein: 7.8 g/dL (ref 6.5–8.1)

## 2019-02-09 LAB — GLUCOSE, CAPILLARY
Glucose-Capillary: 135 mg/dL — ABNORMAL HIGH (ref 70–99)
Glucose-Capillary: 83 mg/dL (ref 70–99)

## 2019-02-09 LAB — CBC WITH DIFFERENTIAL/PLATELET
Abs Immature Granulocytes: 0.15 10*3/uL — ABNORMAL HIGH (ref 0.00–0.07)
Basophils Absolute: 0.1 10*3/uL (ref 0.0–0.1)
Basophils Relative: 0 %
Eosinophils Absolute: 0 10*3/uL (ref 0.0–0.5)
Eosinophils Relative: 0 %
HCT: 35.2 % — ABNORMAL LOW (ref 36.0–46.0)
Hemoglobin: 11.3 g/dL — ABNORMAL LOW (ref 12.0–15.0)
Immature Granulocytes: 1 %
Lymphocytes Relative: 16 %
Lymphs Abs: 2.9 10*3/uL (ref 0.7–4.0)
MCH: 27.4 pg (ref 26.0–34.0)
MCHC: 32.1 g/dL (ref 30.0–36.0)
MCV: 85.4 fL (ref 80.0–100.0)
Monocytes Absolute: 1.3 10*3/uL — ABNORMAL HIGH (ref 0.1–1.0)
Monocytes Relative: 7 %
Neutro Abs: 13.6 10*3/uL — ABNORMAL HIGH (ref 1.7–7.7)
Neutrophils Relative %: 76 %
Platelets: 310 10*3/uL (ref 150–400)
RBC: 4.12 MIL/uL (ref 3.87–5.11)
RDW: 14.4 % (ref 11.5–15.5)
WBC: 18 10*3/uL — ABNORMAL HIGH (ref 4.0–10.5)
nRBC: 0 % (ref 0.0–0.2)

## 2019-02-09 LAB — URINALYSIS, ROUTINE W REFLEX MICROSCOPIC
Bilirubin Urine: NEGATIVE
Glucose, UA: NEGATIVE mg/dL
Ketones, ur: 15 mg/dL — AB
Nitrite: NEGATIVE
Protein, ur: NEGATIVE mg/dL
Specific Gravity, Urine: 1.02 (ref 1.005–1.030)
pH: 6.5 (ref 5.0–8.0)

## 2019-02-09 LAB — SARS CORONAVIRUS 2 BY RT PCR (HOSPITAL ORDER, PERFORMED IN ~~LOC~~ HOSPITAL LAB): SARS Coronavirus 2: NEGATIVE

## 2019-02-09 LAB — URINALYSIS, MICROSCOPIC (REFLEX)

## 2019-02-09 LAB — PREGNANCY, URINE: Preg Test, Ur: NEGATIVE

## 2019-02-09 MED ORDER — MORPHINE SULFATE (PF) 4 MG/ML IV SOLN
4.0000 mg | Freq: Once | INTRAVENOUS | Status: AC
  Administered 2019-02-09: 4 mg via INTRAVENOUS
  Filled 2019-02-09 (×2): qty 1

## 2019-02-09 MED ORDER — ACETAMINOPHEN 325 MG PO TABS
ORAL_TABLET | ORAL | Status: AC
  Filled 2019-02-09: qty 2

## 2019-02-09 MED ORDER — SODIUM CHLORIDE 0.9 % IV SOLN
INTRAVENOUS | Status: AC
  Administered 2019-02-09: 20:00:00 via INTRAVENOUS
  Filled 2019-02-09: qty 250

## 2019-02-09 MED ORDER — ACETAMINOPHEN 650 MG RE SUPP: 650.0000 mg | Freq: Four times a day (QID) | RECTAL | Status: DC | PRN

## 2019-02-09 MED ORDER — ATORVASTATIN CALCIUM 40 MG PO TABS
80.0000 mg | ORAL_TABLET | Freq: Every evening | ORAL | Status: DC
  Administered 2019-02-09 – 2019-02-13 (×5): 80 mg via ORAL
  Filled 2019-02-09 (×2): qty 2
  Filled 2019-02-09: qty 1
  Filled 2019-02-09 (×3): qty 2

## 2019-02-09 MED ORDER — HYDROCHLOROTHIAZIDE 25 MG PO TABS
25.0000 mg | ORAL_TABLET | Freq: Every day | ORAL | Status: DC
  Administered 2019-02-09 – 2019-02-10 (×2): 25 mg via ORAL
  Filled 2019-02-09 (×2): qty 1

## 2019-02-09 MED ORDER — PIPERACILLIN-TAZOBACTAM 3.375 G IVPB
3.3750 g | Freq: Three times a day (TID) | INTRAVENOUS | Status: DC
  Administered 2019-02-09 – 2019-02-14 (×15): 3.375 g via INTRAVENOUS
  Filled 2019-02-09 (×15): qty 50

## 2019-02-09 MED ORDER — GLIPIZIDE 5 MG PO TABS
5.0000 mg | ORAL_TABLET | Freq: Two times a day (BID) | ORAL | Status: DC
  Administered 2019-02-09 – 2019-02-10 (×2): 5 mg via ORAL
  Filled 2019-02-09 (×3): qty 1

## 2019-02-09 MED ORDER — INSULIN ASPART 100 UNIT/ML ~~LOC~~ SOLN
0.0000 [IU] | Freq: Three times a day (TID) | SUBCUTANEOUS | Status: DC
  Administered 2019-02-10: 2 [IU] via SUBCUTANEOUS
  Administered 2019-02-11: 13:00:00 3 [IU] via SUBCUTANEOUS
  Administered 2019-02-11 (×2): 2 [IU] via SUBCUTANEOUS
  Administered 2019-02-12 – 2019-02-13 (×4): 3 [IU] via SUBCUTANEOUS

## 2019-02-09 MED ORDER — GLIPIZIDE 5 MG PO TABS
5.0000 mg | ORAL_TABLET | Freq: Two times a day (BID) | ORAL | Status: DC
  Filled 2019-02-09 (×2): qty 1

## 2019-02-09 MED ORDER — BISACODYL 5 MG PO TBEC: 5.0000 mg | DELAYED_RELEASE_TABLET | Freq: Every day | ORAL | Status: DC | PRN

## 2019-02-09 MED ORDER — FENTANYL CITRATE (PF) 100 MCG/2ML IJ SOLN
INTRAMUSCULAR | Status: AC | PRN
  Administered 2019-02-09 (×2): 50 ug via INTRAVENOUS

## 2019-02-09 MED ORDER — SODIUM CHLORIDE 0.9 % IV BOLUS
1000.0000 mL | Freq: Once | INTRAVENOUS | Status: AC
  Administered 2019-02-09: 20:00:00 via INTRAVENOUS

## 2019-02-09 MED ORDER — MIDAZOLAM HCL 2 MG/2ML IJ SOLN
INTRAMUSCULAR | Status: AC | PRN
  Administered 2019-02-09 (×2): 1 mg via INTRAVENOUS

## 2019-02-09 MED ORDER — ACETAMINOPHEN 325 MG PO TABS
650.0000 mg | ORAL_TABLET | Freq: Once | ORAL | Status: AC
  Administered 2019-02-09: 650 mg via ORAL

## 2019-02-09 MED ORDER — HYDROMORPHONE HCL 1 MG/ML IJ SOLN
1.0000 mg | Freq: Once | INTRAMUSCULAR | Status: AC
  Administered 2019-02-09: 1 mg via INTRAVENOUS
  Filled 2019-02-09: qty 1

## 2019-02-09 MED ORDER — ACETAMINOPHEN 325 MG PO TABS
650.0000 mg | ORAL_TABLET | Freq: Four times a day (QID) | ORAL | Status: DC | PRN
  Administered 2019-02-12 – 2019-02-14 (×3): 650 mg via ORAL
  Filled 2019-02-09 (×3): qty 2

## 2019-02-09 MED ORDER — MIDAZOLAM HCL 2 MG/2ML IJ SOLN
INTRAMUSCULAR | Status: AC
  Filled 2019-02-09: qty 6

## 2019-02-09 MED ORDER — AMLODIPINE BESYLATE 5 MG PO TABS
5.0000 mg | ORAL_TABLET | Freq: Every day | ORAL | Status: DC
  Administered 2019-02-09 – 2019-02-14 (×6): 5 mg via ORAL
  Filled 2019-02-09 (×6): qty 1

## 2019-02-09 MED ORDER — MUPIROCIN 2 % EX OINT: 1.0000 "application " | TOPICAL_OINTMENT | Freq: Two times a day (BID) | CUTANEOUS | Status: DC

## 2019-02-09 MED ORDER — HYDROCODONE-ACETAMINOPHEN 5-325 MG PO TABS
1.0000 | ORAL_TABLET | ORAL | Status: DC | PRN
  Administered 2019-02-09 – 2019-02-10 (×4): 1 via ORAL
  Filled 2019-02-09 (×4): qty 1

## 2019-02-09 MED ORDER — ENOXAPARIN SODIUM 60 MG/0.6ML ~~LOC~~ SOLN
60.0000 mg | SUBCUTANEOUS | Status: DC
  Administered 2019-02-09 – 2019-02-14 (×6): 60 mg via SUBCUTANEOUS
  Filled 2019-02-09 (×6): qty 0.6

## 2019-02-09 MED ORDER — KETOROLAC TROMETHAMINE 30 MG/ML IJ SOLN
30.0000 mg | Freq: Once | INTRAMUSCULAR | Status: AC
  Administered 2019-02-09: 03:00:00 30 mg via INTRAVENOUS
  Filled 2019-02-09: qty 1

## 2019-02-09 MED ORDER — ENOXAPARIN SODIUM 40 MG/0.4ML ~~LOC~~ SOLN: 40.0000 mg | SUBCUTANEOUS | Status: DC

## 2019-02-09 MED ORDER — ONDANSETRON HCL 4 MG/2ML IJ SOLN
4.0000 mg | Freq: Once | INTRAMUSCULAR | Status: AC
  Administered 2019-02-09: 4 mg via INTRAVENOUS
  Filled 2019-02-09 (×2): qty 2

## 2019-02-09 MED ORDER — FENTANYL CITRATE (PF) 100 MCG/2ML IJ SOLN
INTRAMUSCULAR | Status: AC
  Filled 2019-02-09: qty 4

## 2019-02-09 MED ORDER — KCL IN DEXTROSE-NACL 40-5-0.9 MEQ/L-%-% IV SOLN
INTRAVENOUS | Status: DC
  Administered 2019-02-09 – 2019-02-13 (×6): via INTRAVENOUS
  Filled 2019-02-09 (×7): qty 1000

## 2019-02-09 MED ORDER — IOHEXOL 300 MG/ML  SOLN
100.0000 mL | Freq: Once | INTRAMUSCULAR | Status: AC | PRN
  Administered 2019-02-09: 100 mL via INTRAVENOUS

## 2019-02-09 MED ORDER — HYDROMORPHONE HCL 1 MG/ML PO LIQD
1.0000 mg | Freq: Once | ORAL | Status: DC
  Filled 2019-02-09: qty 1

## 2019-02-09 MED ORDER — SODIUM CHLORIDE 0.9 % IV BOLUS
1000.0000 mL | Freq: Once | INTRAVENOUS | Status: AC
  Administered 2019-02-09: 1000 mL via INTRAVENOUS

## 2019-02-09 MED ORDER — LOSARTAN POTASSIUM 50 MG PO TABS
50.0000 mg | ORAL_TABLET | Freq: Every day | ORAL | Status: DC
  Administered 2019-02-09 – 2019-02-14 (×6): 50 mg via ORAL
  Filled 2019-02-09 (×6): qty 1

## 2019-02-09 MED ORDER — DEXTROSE IN LACTATED RINGERS 5 % IV SOLN: INTRAVENOUS | Status: DC

## 2019-02-09 MED ORDER — PIPERACILLIN-TAZOBACTAM 3.375 G IVPB
3.3750 g | Freq: Once | INTRAVENOUS | Status: AC
  Administered 2019-02-09: 04:00:00 3.375 g via INTRAVENOUS
  Filled 2019-02-09: qty 50

## 2019-02-09 NOTE — ED Notes (Signed)
Pt attempting to confirm a ride prior to narcotic administration. Pt reports pain tolerable at this time to wait for confirmation.

## 2019-02-09 NOTE — Progress Notes (Signed)
   02/09/19 2222  Vitals  Temp (!) 100.5 F (38.1 C)  Temp Source Oral  BP (!) 95/55  MAP (mmHg) 67  BP Location Right Arm  BP Method Automatic  Patient Position (if appropriate) Sitting  Pulse Rate 84  Resp 20  Oxygen Therapy  SpO2 97 %  O2 Device Room Air  MEWS Score  MEWS RR 0  MEWS Pulse 0  MEWS Systolic 1  MEWS LOC 0  MEWS Temp 1  MEWS Score 2  MEWS Score Color Yellow  Provider notified of above.

## 2019-02-09 NOTE — ED Notes (Signed)
This RN asked pt about keflex prescription and pt denies having prescription and UTI.

## 2019-02-09 NOTE — Consult Note (Signed)
Robyn [name] is an 38 y.o. female. Presenting with a diverticular abscess that may involve a known cyst on left ovary.  She [facility]  She just had the abscess drained and has some pain.  No n/v and tolerating food  Pertinent Gynecological History: Menses: flow is light Bleeding: na Contraception: Nexplanon Sexually transmitted diseases: no past history Previous GYN Procedures: CS  Last mammogram: nA Date:  Last pap: abnormal: ascus 2019 Date: 2019 OB History: G2, P2   Menstrual History: Menarche age: [age] No LMP recorded. Patient has had an implant.    Past Medical History:  Diagnosis Date  . Anxiety    h/o panic attacks  . Diabetes mellitus without complication (HCC)    diet controlled   . Herpes   . Hypertension   . Left ovarian cyst 11/29/2011  . PCOS (polycystic ovarian syndrome)   . Pregnancy induced hypertension   . Shortness of breath dyspnea    with exertion and while pregnant  . Smoker 11/29/2011    Past Surgical History:  Procedure Laterality Date  . CERVICAL CERCLAGE N/A 10/09/2014   Procedure: CERCLAGE CERVICAL;  Surgeon: Osborn CohoAngela Roberts, MD;  Location: [facility];  Service: Gynecology;  Laterality: N/A;  . CESAREAN SECTION     C/S x 2  . CESAREAN SECTION WITH BILATERAL TUBAL LIGATION Bilateral 03/23/2015   Procedure: CESAREAN SECTION WITH BILATERAL TUBAL LIGATION;  Surgeon: Osborn CohoAngela Roberts, MD;  Location: [facility];  Service: Obstetrics;  Laterality: Bilateral;  Amt OK'd to move 02/25/2015  . WISDOM TOOTH EXTRACTION      Family History  Problem Relation Age of Onset  . Hypertension Mother   . Diabetes Father   . Stroke Father   . Cancer Maternal Grandmother   . Cancer Maternal Uncle     Social History:  reports that she has been smoking cigarettes. She has a 3.25 pack-year smoking history. She has never used smokeless tobacco. She reports previous alcohol use. She reports that she does not use drugs.  Allergies: No Known  Allergies  Medications Prior to Admission  Medication Sig Dispense Refill Last Dose  . atorvastatin (LIPITOR) 80 MG tablet Take 80 mg by mouth every evening.   02/08/2019 at Unknown time  . glipiZIDE (GLUCOTROL) 5 MG tablet Take 5 mg by mouth 2 (two) times daily.   02/07/2019  . hydrochlorothiazide (HYDRODIURIL) 25 MG tablet Take 25 mg by mouth daily.   02/09/2019 at Unknown time  . HYDROcodone-acetaminophen (NORCO/VICODIN) 5-325 MG tablet Take 1 tablet by mouth every 4 (four) hours as needed. (Patient taking differently: Take 1 tablet by mouth every 4 (four) hours as needed for moderate pain. ) 10 tablet 0 Past Week at Unknown time  . ibuprofen (ADVIL) 800 MG tablet Take 1 tablet (800 mg total) by mouth every 8 (eight) hours as needed for mild pain. 30 tablet 0 Past Week at Unknown time  . losartan (COZAAR) 50 MG tablet Take 50 mg by mouth daily.   02/09/2019 at Unknown time  . metFORMIN (GLUCOPHAGE-XR) 500 MG 24 hr tablet Take 1,000 mg by mouth 2 (two) times daily.   02/07/2019  . ondansetron (ZOFRAN ODT) 4 MG disintegrating tablet Take 1 tablet (4 mg total) by mouth every 6 (six) hours as needed. (Patient taking differently: Take 4 mg by mouth every 6 (six) hours as needed for nausea. ) 20 tablet 0 Past Week at Unknown time  . amLODipine (NORVASC) 5 MG tablet Take 1  tablet (5 mg total) by mouth daily. (Patient not taking: Reported on 02/09/2019) 30 tablet 0 Not Taking at Unknown time  . glucose blood (ONETOUCH VERIO) test strip 1 each by Other route 4 (four) times daily -  before meals and at bedtime. And lancets 4/day 120 each 5   . insulin aspart (NOVOLOG) 100 UNIT/ML injection Inject 40 units two times daily with meals. (Patient not taking: Reported on 02/02/2019) 10 mL 11     ROS  Blood pressure 121/83, pulse 82, temperature 97.7 F (36.5 C), temperature source Oral, resp. rate 18, height 5\' 8"  (1.727 m), weight 131.2 kg, SpO2 100 %, not currently breastfeeding. Physical Exam  Physical  Examination: General appearance - alert, well appearing, and in no distress Chest - clear to auscultation, no wheezes, rales or rhonchi, symmetric air entry Heart - normal rate and regular rhythm Abdomen - soft, MILD LLQTENDERNESS, nondistended, no masses or organomegaly Extremities - Homan's sign negative bilaterally  Results for orders placed or performed during the hospital encounter of 02/09/19 (from the past 24 hour(s))  Urinalysis, Routine w reflex microscopic     Status: Abnormal   Collection Time: 02/09/19  1:01 AM  Result Value Ref Range   Color, Urine YELLOW YELLOW   APPearance CLOUDY (A) CLEAR   Specific Gravity, Urine 1.020 1.005 - 1.030   pH 6.5 5.0 - 8.0   Glucose, UA NEGATIVE NEGATIVE mg/dL   Hgb urine dipstick SMALL (A) NEGATIVE   Bilirubin Urine NEGATIVE NEGATIVE   Ketones, ur 15 (A) NEGATIVE mg/dL   Protein, ur NEGATIVE NEGATIVE mg/dL   Nitrite NEGATIVE NEGATIVE   Leukocytes,Ua LARGE (A) NEGATIVE  Urinalysis, Microscopic (reflex)     Status: Abnormal   Collection Time: 02/09/19  1:01 AM  Result Value Ref Range   RBC / HPF 6-10 0 - 5 RBC/hpf   WBC, UA 11-20 0 - 5 WBC/hpf   Bacteria, UA MANY (A) NONE SEEN   Squamous Epithelial / LPF 6-10 0 - 5  Pregnancy, urine     Status: None   Collection Time: 02/09/19  1:01 AM  Result Value Ref Range   Preg Test, Ur NEGATIVE NEGATIVE  Comprehensive metabolic panel     Status: Abnormal   Collection Time: 02/09/19  1:50 AM  Result Value Ref Range   Sodium 132 (L) 135 - 145 mmol/L   Potassium 3.0 (L) 3.5 - 5.1 mmol/L   Chloride 91 (L) 98 - 111 mmol/L   CO2 28 22 - 32 mmol/L   Glucose, Bld 195 (H) 70 - 99 mg/dL   BUN 7 6 - 20 mg/dL   Creatinine, Ser 4.12 0.44 - 1.00 mg/dL   Calcium 9.1 8.9 - 87.8 mg/dL   Total Protein 7.8 6.5 - 8.1 g/dL   Albumin 3.1 (L) 3.5 - 5.0 g/dL   AST 10 (L) 15 - 41 U/L   ALT 13 0 - 44 U/L   Alkaline Phosphatase 71 38 - 126 U/L   Total Bilirubin 0.4 0.3 - 1.2 mg/dL   GFR calc non Af Amer >60 >60  mL/min   GFR calc Af Amer >60 >60 mL/min   Anion gap 13 5 - 15  CBC with Differential     Status: Abnormal   Collection Time: 02/09/19  1:50 AM  Result Value Ref Range   WBC 18.0 (H) 4.0 - 10.5 K/uL   RBC 4.12 3.87 - 5.11 MIL/uL   Hemoglobin 11.3 (L) 12.0 - 15.0 g/dL   HCT 67.6 (L)  36.0 - 46.0 %   MCV 85.4 80.0 - 100.0 fL   MCH 27.4 26.0 - 34.0 pg   MCHC 32.1 30.0 - 36.0 g/dL   RDW 14.4 11.5 - 15.5 %   Platelets 310 150 - 400 K/uL   nRBC 0.0 0.0 - 0.2 %   Neutrophils Relative % 76 %   Neutro Abs 13.6 (H) 1.7 - 7.7 K/uL   Lymphocytes Relative 16 %   Lymphs Abs 2.9 0.7 - 4.0 K/uL   Monocytes Relative 7 %   Monocytes Absolute 1.3 (H) 0.1 - 1.0 K/uL   Eosinophils Relative 0 %   Eosinophils Absolute 0.0 0.0 - 0.5 K/uL   Basophils Relative 0 %   Basophils Absolute 0.1 0.0 - 0.1 K/uL   Immature Granulocytes 1 %   Abs Immature Granulocytes 0.15 (H) 0.00 - 0.07 K/uL  SARS Coronavirus 2 [facility] order, Performed in [place] hospital lab) Nasopharyngeal Nasopharyngeal Swab     Status: None   Collection Time: 02/09/19  4:04 AM   Specimen: Nasopharyngeal Swab  Result Value Ref Range   SARS Coronavirus 2 NEGATIVE NEGATIVE    Ct Abdomen Pelvis W Contrast  Result Date: 02/09/2019 CLINICAL DATA:  No diverticulitis, follow-up or though fluid EXAM: CT ABDOMEN AND PELVIS WITH CONTRAST TECHNIQUE: Multidetector CT imaging of the abdomen and pelvis was performed using the standard protocol following bolus administration of intravenous contrast. CONTRAST:  167mL OMNIPAQUE IOHEXOL 300 MG/ML  SOLN COMPARISON:  CT 02/02/2019 FINDINGS: Lower chest: Dependent basilar atelectasis. Lung bases are otherwise clear Normal heart size. No pericardial effusion. Hepatobiliary: No focal liver abnormality is seen. No gallstones, gallbladder wall thickening, or biliary dilatation. Pancreas: Unremarkable. No pancreatic ductal dilatation or surrounding inflammatory changes. Spleen: Normal in size without focal  abnormality. Adrenals/Urinary Tract: Adrenal glands are unremarkable. Lobulation tissue in the left renal hilum appears to reflect atypical region of scarring or persistent fetal lobulation, incompletely characterized on this single-phase study. Kidneys are otherwise unremarkable, without renal calculi, suspicious lesion, or hydronephrosis. Asymmetric anterior bladder wall thickening, possibly reactive. Stomach/Bowel: Distal esophagus, stomach and duodenal sweep are unremarkable. No bowel wall thickening or dilatation. There is thickening of the mid sigmoid colon and interval development of a complex, multiloculated, rim enhancing collection extending from the wall of the colon and involving the directly adjacent ovary. Conglomerate size measures approximately 7.6 x 7.2 x 8.2 cm. Extensive adjacent phlegmon and trace reactive free fluid is present. Notably, there is no discernible gas within the collection. More distal colon is largely decompressed. No evidence of resultant bowel obstruction. Vascular/Lymphatic: The aorta is normal caliber. No suspicious or enlarged lymph nodes in the included lymphatic chains. Reactive adenopathy noted in the left inguinal region. Reproductive: Anteverted uterus with slight fundal retroflexion and features of prior Caesarean section. Right adnexa is unremarkable. Marked enlargement of the left ovary with associated rim enhancing multiloculated collection with loss of discernible fat plane along the adjacent colon and lateral aspect of the uterine fundus, further detailed on bowel section above. Other: Small amount of reactive fluid and extensive phlegmonous change in the left lower quadrant. No bowel containing hernia. Postsurgical changes from prior low vertical and transverse abdominal incisions. Musculoskeletal: Multilevel degenerative changes are present in the imaged portions of the spine. IMPRESSION: 1. Interval development of a complex, multiloculated, rim enhancing  collection extending from the wall of the sigmoid colon and involving the directly adjacent ovary and uterine adnexa. Extensive adjacent phlegmon and trace reactive free fluid. No discernible gas within  the collection. Given rapid progression and concomitant leukocytosis, favor the result of diverticulitis with abscess formation and possible colo-ovarian fistulization. Alternatively, a cystic ovarian neoplasm with direct involvement of the adjacent colon and resulting superinfection could provide a similar appearance. 2. Asymmetric anterior bladder wall thickening, possibly reactive. Correlate with urinalysis to exclude cystitis. 3. Lobular soft tissue in the left renal hilum appears to reflect atypical region of scarring or persistent fetal lobulation, incompletely characterized on this single-phase study. Could consider further evaluation with renal ultrasound or MR. These results were called by telephone at the time of interpretation on 02/09/2019 at 3:18 am to provider Geoffery LyonsUGLAS DELO , who verbally acknowledged these results. Electronically Signed   By: Kreg ShropshirePrice  DeHay M.D.   On: 02/09/2019 03:18   Ct Image Guided Drainage By Percutaneous Catheter  Result Date: 02/09/2019 INDICATION: Enlarging left lower quadrant pelvic abscess, concern for diverticular abscess versus tubo-ovarian abscess EXAM: CT GUIDED DRAINAGE OF LEFT LOWER QUADRANT PELVIC ABSCESS MEDICATIONS: The patient is currently admitted to the hospital and receiving intravenous antibiotics. The antibiotics were administered within an appropriate time frame prior to the initiation of the procedure. ANESTHESIA/SEDATION: 3.0 mg IV Versed 100 mcg IV Fentanyl Moderate Sedation Time:  14 minutes The patient was continuously monitored during the procedure by the interventional radiology nurse under my direct supervision. COMPLICATIONS: None immediate. TECHNIQUE: Informed written consent was obtained from the patient after a thorough discussion of the procedural  risks, benefits and alternatives. All questions were addressed. Maximal Sterile Barrier Technique was utilized including caps, mask, sterile gowns, sterile gloves, sterile drape, hand hygiene and skin antiseptic. A timeout was performed prior to the initiation of the procedure. PROCEDURE: The left lower quadrant was prepped with ChloraPrep in a sterile fashion, and a sterile drape was applied covering the operative field. A sterile gown and sterile gloves were used for the procedure. Local anesthesia was provided with 1% Lidocaine. Previous imaging reviewed. Patient positioned supine. Noncontrast localization CT performed. The enlarging left lower quadrant pelvic abscess adjacent to the sigmoid colon and left adnexa was localized and marked. Under sterile conditions and local anesthesia, an 18 gauge 15 cm access needle was advanced from an anterior oblique approach into the left lower quadrant pelvic abscess. Syringe aspiration yielded purulent fluid. Sample sent for cultures. Guidewire inserted followed by tract dilatation insert a 10 French drain. Drain catheter position confirmed with CT. Catheter secured with a Prolene suture. 25 cc bloody purulent fluid aspirated. Saline flush performed followed by external suction bulb. Sterile dressing applied. No immediate complication. Patient tolerated the procedure well. FINDINGS: Imaging confirms needle placement for abscess drain in the left lower quadrant. IMPRESSION: Successful CT-guided left lower quadrant pelvic abscess drain placement. Electronically Signed   By: Judie PetitM.  Shick M.D.   On: 02/09/2019 13:26    Assessment/Plan: Diverticulitis with poss left ovarian involvement Agree with abx and draining the abscess Pt now afebrile and hope she will continue to improve [facility] [phone]  Sukhman Martine A Nanci Lakatos 02/09/2019, 3:52 PM

## 2019-02-09 NOTE — ED Notes (Signed)
Pt unable to get ride- see MAR for pain treatment

## 2019-02-09 NOTE — Procedures (Signed)
LLQ PELVIC ABSCESS  S/p CT drain 25cc pus aspirated Cxs sent No comp Stable Full report in pacs

## 2019-02-09 NOTE — Plan of Care (Signed)
  Problem: Activity: Goal: Risk for activity intolerance will decrease Outcome: Progressing   Problem: Nutrition: Goal: Adequate nutrition will be maintained Outcome: Progressing   Problem: Pain Managment: Goal: General experience of comfort will improve Outcome: Progressing   Problem: Safety: Goal: Ability to remain free from injury will improve Outcome: Progressing   

## 2019-02-09 NOTE — H&P (Addendum)
HPI  Robyn [name] WUJ:[id number] DOB: [birth year] DOA: 02/09/2019  PCP: Maudie FlakesAnderson, Shane D, FNP   Chief Complaint: Abdominal pain  HPI:  38 year old black female PCOS DM TY 2 on insulin 70/30 HTN, grand multiparity G5P2-Nexplanon in place--prior cervicals cerclage smoker, history HSV, morbid obesity BMI 40 Multiple visits to emergency room-most recently 9/5 where she had significant abdominal pain pelvic ultrasound showed this was normal-CT scan done at the time did not show any significant issue-was prescribed hydrocodone at the time and was discharged  Retrospectively tells me he has been having abdominal pain since August and she attributed this all to her PCOS-  saw OB/GYN Dr. Osborn CohoAngela Roberts given Toradol in her arm which only helped temporarily and was told to come back if further issues--- apparently had an ultrasound showing no fibroids CT scan showed fatty liver  [facility] [place]-and because of left lower quadrant pain work-up performed--- Dr. Carolynne Edouardoth of general surgery was consulted by ED and he recommended admission with IV antibiotics as well as consideration of IR drain placement  Review of Systems:  Reviewed Pertinent +'s: + Left lower quadrant pain Pertinent -"s: - Fever, - chills, + blurred vision, - double vision, - S OB, - dysuria, - hematuria, - menses  ED Course: Given Dilaudid, Toradol, morphine, Norco for pain Started on Zosyn  I put in consult for IR to see   Past Medical History:  Diagnosis Date  . Anxiety    h/o panic attacks  . Diabetes mellitus without complication (HCC)    diet controlled   . Herpes   . Hypertension   . Left ovarian cyst 11/29/2011  . PCOS (polycystic ovarian syndrome)   . Pregnancy induced hypertension   . Shortness of breath dyspnea    with exertion and while pregnant  . Smoker 11/29/2011   Past Surgical History:  Procedure Laterality Date  . CERVICAL CERCLAGE N/A 10/09/2014   Procedure: CERCLAGE  CERVICAL;  Surgeon: Osborn CohoAngela Roberts, MD;  Location: [facility];  Service: Gynecology;  Laterality: N/A;  . CESAREAN SECTION     C/S x 2  . CESAREAN SECTION WITH BILATERAL TUBAL LIGATION Bilateral 03/23/2015   Procedure: CESAREAN SECTION WITH BILATERAL TUBAL LIGATION;  Surgeon: Osborn CohoAngela Roberts, MD;  Location: [facility];  Service: Obstetrics;  Laterality: Bilateral;  Amt OK'd to move 02/25/2015  . WISDOM TOOTH EXTRACTION      reports that she has been smoking cigarettes. She has a 3.25 pack-year smoking history. She has never used smokeless tobacco. She reports previous alcohol use. She reports that she does not use drugs.  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 Hypertension Mother   . Diabetes Father   . Stroke Father   . Cancer Maternal Grandmother   . Cancer Maternal Uncle    Prior to Admission medications   Medication Sig Start Date End Date Taking? Authorizing Provider  atorvastatin (LIPITOR) 80 MG tablet Take 80 mg by mouth every evening. 12/23/18  Yes [provider]  glipiZIDE (GLUCOTROL) 5 MG tablet Take 5 mg by mouth 2 (two) times daily. 01/21/19  Yes [provider]  hydrochlorothiazide (HYDRODIURIL) 25 MG tablet Take 25 mg by mouth daily. 01/24/19  Yes [provider]  HYDROcodone-acetaminophen (NORCO/VICODIN) 5-325 MG tablet Take 1 tablet by mouth every 4 (four) hours as needed. 02/02/19  Yes Ward,  Kristen N, DO  ibuprofen (ADVIL) 800 MG tablet Take 1 tablet (800 mg total) by mouth every 8 (eight) hours as needed for mild pain. 02/02/19  Yes Ward, Cyril Mourning N, DO  losartan (COZAAR) 50 MG tablet Take 50 mg by mouth daily. 01/24/19  Yes [provider]  metFORMIN (GLUCOPHAGE-XR) 500 MG 24 hr tablet Take 1,000 mg by mouth 2 (two) times daily. 12/23/18  Yes [provider]  ondansetron (ZOFRAN ODT) 4 MG disintegrating tablet  Take 1 tablet (4 mg total) by mouth every 6 (six) hours as needed. 02/02/19  Yes Ward, Kristen N, DO  amLODipine (NORVASC) 5 MG tablet Take 1 tablet (5 mg total) by mouth daily. 01/19/16   Mumaw, Lauralyn Primes, DO  glucose blood (ONETOUCH VERIO) test strip 1 each by Other route 4 (four) times daily -  before meals and at bedtime. And lancets 4/day 01/13/15   Renato Shin, MD  insulin aspart (NOVOLOG) 100 UNIT/ML injection Inject 40 units two times daily with meals. Patient not taking: Reported on 02/02/2019 11/05/17   Renato Shin, MD    Physical Exam:  Vitals:   02/09/19 0547 02/09/19 0902  BP: 124/80 120/73  Pulse: 87 76  Resp: 19 16  Temp: 98.3 F (36.8 C) 98.5 F (36.9 C)  SpO2: 99% 96%     Awake coherent pleasant EOMI NCAT Mallampati 4  S1-S2 no murmur rub or gallop  Abdomen obese midline classic C-section scar-pain is lateral on the left side not able to reproduce  No lower extremity edema  Range of motion intact  Neurologically intact with no focal deficit  I have personally reviewed following labs and imaging studies  Labs:   Sodium 132 potassium 3.0 chloride 91  BUN/creatinine 7/0.7  Albumin 3.1  WBC 18  Imaging studies:  IMPRESSION: 1. Interval development of a complex, multiloculated, rim enhancing collection extending from the wall of the sigmoid colon and involving the directly adjacent ovary and uterine adnexa. Extensive adjacent phlegmon and trace reactive free fluid. No discernible gas within the collection. Given rapid progression and concomitant leukocytosis, favor the result of diverticulitis with abscess formation and possible colo-ovarian fistulization. Alternatively, a cystic ovarian neoplasm with direct involvement of the adjacent colon and resulting superinfection could provide a similar appearance. 2. Asymmetric anterior bladder wall thickening, possibly reactive. Correlate with urinalysis to exclude cystitis. 3. Lobular soft tissue  in the left renal hilum appears to reflect atypical region of scarring or persistent fetal lobulation, incompletely characterized on this single-phase study. Could  consider further evaluation with renal ultrasound or MR.   Medical tests:   EKG independently reviewed: None  Test discussed with performing physician:  Yes discussed briefly on telephone with OB/GYN who felt treatment of this process would be the same with IR drainage of the same  Decision to obtain old records:   Yes  Review and summation of old records:   Yes extensively summarized  Active Problems:   Obesity   PCOS (polycystic ovarian syndrome)   Hypertension - on Labetalol 200 mg po bid   Diabetes mellitus type 2, uncontrolled, without complications -- dx'd in 2011   S/P repeat low transverse C-section   Diverticulitis  Assessment/Plan Probable diverticular abscess Dr. Annamaria Boots of IR over read the imaging and feels that this is either tubo-ovarian abscess versus diverticulitis abscess In any event continue Zosyn for now, saline with K and monitor trends-I have added on GC and chlamydia given her prior STD history to testing--appreicate his coordination Abscess drain  placement today. asked Dr. Emelda Fear of faculty practice of OB/GYN--states PID picture--states Rx pretty similar in either event--will need to probably go home on Doxy/flagyll and follow in 2 weeks at Dr. Su Hilt --he will arrange for patient to be seen while in-house but does not suspect anything differently will be needed DM TY 2-A1c pending Continue glipizide-Place on sliding scale while in-house Metformin 1000 twice daily has been discontinued-please note that this may have been added specifically because of her PCOS history-GYN to comment on the same but would resume otherwise if no azotemia prior to discharge HTN Continue Norvasc 5, HCTZ 25, Cozaar 50 Contraception Needs follow-up for replacement of Nexplanon Morbid obesity BMI >40  Consideration for outpatient weight loss management in addition to continued use of metformin in outpatient setting  Severity of Illness: The appropriate patient status for this patient is INPATIENT. Inpatient status is judged to be reasonable and necessary in order to provide the required intensity of service to ensure the patient's safety. The patient's presenting symptoms, physical exam findings, and initial radiographic and laboratory data in the context of their chronic comorbidities is felt to place them at high risk for further clinical deterioration. Furthermore, it is not anticipated that the patient will be medically stable for discharge from the hospital within 2 midnights of admission. The following factors support the patient status of inpatient.   " The patient's presenting symptoms include abdominal pain. " The worrisome physical exam findings include pain. " The initial radiographic and laboratory data are worrisome because of abscess. " The chronic co-morbidities include obesity PCOS grand multiparity.   * I certify that at the point of admission it is my clinical judgment that the patient will require inpatient hospital care spanning beyond 2 midnights from the point of admission due to high intensity of service, high risk for further deterioration and high frequency of surveillance required.*  http://www.randall.com/ MediaLives.de BuilderWeekly.hu StrikeValue.es https://www.mdcalc.com/heart-score-major-cardiac-events] https://www.villa.com/  DVT prophylaxis: Lovenox Code Status: Full Family Communication: None Consults called: Gynecology Dr. Dillard Essex primary OB/GYN is Dr Marylene Land. Su Hilt, communicated with IR patch via chat  Time spent: 75  including care coordination time minutes  Mahala Menghini, MD Cordelia Poche my NP partners at night for Care related issues] [facility] --Via amion app OR , [email or web address]; password [facility]  02/09/2019, 9:12 AM

## 2019-02-09 NOTE — Progress Notes (Signed)
Pharmacy Antibiotic Note  Robyn [name] is a 38 y.o. female admitted on 02/09/2019 with intra-abdominal infection.  Pharmacy has been consulted for Zosyn dosing. WBC is elevated. Renal function. CT with diverticulitis with phlegmon. May need IR drain.   Plan: Zosyn 3.375G IV q8h to be infused over 4 hours Trend WBC, temp, renal function  F/U infectious work-up  Height: 5\' 8"  (172.7 cm) Weight: 280 lb (127 kg) IBW/kg (Calculated) : 63.9  Temp (24hrs), Avg:99.9 F (37.7 C), Min:98.3 F (36.8 C), Max:101.2 F (38.4 C)  Recent Labs  Lab 02/09/19 0150  WBC 18.0*  CREATININE 0.65    Estimated Creatinine Clearance: 132.8 mL/min (by C-G formula based on SCr of 0.65 mg/dL).    No Known Allergies  Narda Bonds, PharmD, BCPS Clinical Pharmacist Phone: [phone]

## 2019-02-09 NOTE — ED Notes (Signed)
[facility] notified (Tammy) - patient ready for transport 

## 2019-02-09 NOTE — ED Notes (Signed)
Attempted to call report with no answer on unit 

## 2019-02-09 NOTE — Treatment Plan (Signed)
Patient accepted in Transfer from [facility] for diverticulitis.  38yo female with PCOS, DM2 presented with several weeks of worsening abd pain, repeat CT scan shows left sided diverticulitis and associated phlegmon and possible involvement of ovaries. ER MD discussed with surgeon on call Dr. Marlou Starks who recommends IV antibiotics and consideration of drainage by IR. Patient was given IV Zosyn, and admit orders for transfer placed.

## 2019-02-09 NOTE — ED Notes (Signed)
Patient transported to CT 

## 2019-02-09 NOTE — ED Provider Notes (Signed)
[facility] EMERGENCY DEPARTMENT Provider Note   CSN: [id number] Arrival date & time: 02/09/19  0041     History   Chief Complaint Chief Complaint  Patient presents with  . Abdominal Pain    HPI Robyn [name] is a 38 y.o. female.     Patient is a 38 year old female with past medical history of polycystic ovaries, diabetes, hypertension, and obesity.  She presents today for evaluation of left lower quadrant pain.  This is been worsening over the past 2 weeks.  She was seen approximately 1 week ago at another facility and diagnosed with ovarian cyst.  She was treated with pain medication which does not seem to be helping.  She does report some loose stools which have been nonbloody.  She denies urinary complaints.  The history is provided by the patient.  Abdominal Pain Pain location:  LLQ Pain quality: cramping   Pain radiates to:  Does not radiate Pain severity:  Moderate Onset quality:  Gradual Duration:  2 weeks Timing:  Constant Progression:  Worsening Chronicity:  New Relieved by:  Nothing Worsened by:  Nothing Ineffective treatments:  None tried   Past Medical History:  Diagnosis Date  . Anxiety    h/o panic attacks  . Diabetes mellitus without complication (HCC)    diet controlled   . Herpes   . Hypertension   . Left ovarian cyst 11/29/2011  . PCOS (polycystic ovarian syndrome)   . Pregnancy induced hypertension   . Shortness of breath dyspnea    with exertion and while pregnant  . Smoker 11/29/2011    Patient Active Problem List   Diagnosis Date Noted  . S/P repeat low transverse C-section 03/23/2015  . Hypertension - on Labetalol 200 mg po bid 11/22/2014  . Diabetes mellitus type 2, uncontrolled, without complications -- dx'd in 2011 11/22/2014  . Hx of incompetent cervix, currently pregnant - cerclage placed on 10/09/14 by Dr. Su Hiltoberts 11/22/2014  . History of preterm delivery x 2 - weekly 17P injections - first injection at 16.5 wks  11/22/2014  . Elderly multigravida 11/22/2014  . PCOS (polycystic ovarian syndrome) 08/21/2014  . Herpes 08/21/2014  . ANA positive 08/21/2014  . Previous cesarean delivery, antepartum condition or complication x 2--previous vertical incision 08/21/2014  . Obesity 05/20/2012    Past Surgical History:  Procedure Laterality Date  . CERVICAL CERCLAGE N/A 10/09/2014   Procedure: CERCLAGE CERVICAL;  Surgeon: Osborn CohoAngela Roberts, MD;  Location: [facility];  Service: Gynecology;  Laterality: N/A;  . CESAREAN SECTION     C/S x 2  . CESAREAN SECTION WITH BILATERAL TUBAL LIGATION Bilateral 03/23/2015   Procedure: CESAREAN SECTION WITH BILATERAL TUBAL LIGATION;  Surgeon: Osborn CohoAngela Roberts, MD;  Location: [facility];  Service: Obstetrics;  Laterality: Bilateral;  Amt OK'd to move 02/25/2015  . WISDOM TOOTH EXTRACTION       OB History    Gravida  5   Para  3   Term      Preterm  3   AB  2   Living  3     SAB  1   TAB  1   Ectopic      Multiple  0   Live Births  3            Home Medications    Prior to Admission medications   Medication Sig Start Date End Date Taking? Authorizing Provider  atorvastatin (LIPITOR) 80 MG tablet Take 80 mg by mouth every evening. 12/23/18  Yes  [provider]  glipiZIDE (GLUCOTROL) 5 MG tablet Take 5 mg by mouth 2 (two) times daily. 01/21/19  Yes [provider]  hydrochlorothiazide (HYDRODIURIL) 25 MG tablet Take 25 mg by mouth daily. 01/24/19  Yes [provider]  HYDROcodone-acetaminophen (NORCO/VICODIN) 5-325 MG tablet Take 1 tablet by mouth every 4 (four) hours as needed. 02/02/19  Yes Ward, Kristen N, DO  ibuprofen (ADVIL) 800 MG tablet Take 1 tablet (800 mg total) by mouth every 8 (eight) hours as needed for mild pain. 02/02/19  Yes Ward, Cyril Mourning N, DO  losartan (COZAAR) 50 MG tablet Take 50 mg by mouth daily. 01/24/19  Yes [provider]  metFORMIN (GLUCOPHAGE-XR) 500 MG 24 hr tablet Take 1,000 mg by mouth 2 (two) times  daily. 12/23/18  Yes [provider]  ondansetron (ZOFRAN ODT) 4 MG disintegrating tablet Take 1 tablet (4 mg total) by mouth every 6 (six) hours as needed. 02/02/19  Yes Ward, Kristen N, DO  amLODipine (NORVASC) 5 MG tablet Take 1 tablet (5 mg total) by mouth daily. 01/19/16   Mumaw, Lauralyn Primes, DO  glucose blood (ONETOUCH VERIO) test strip 1 each by Other route 4 (four) times daily -  before meals and at bedtime. And lancets 4/day 01/13/15   Renato Shin, MD  insulin aspart (NOVOLOG) 100 UNIT/ML injection Inject 40 units two times daily with meals. Patient not taking: Reported on 02/02/2019 11/05/17   Renato Shin, MD    Family History Family History  Problem Relation Age of Onset  . Hypertension Mother   . Diabetes Father   . Stroke Father   . Cancer Maternal Grandmother   . Cancer Maternal Uncle     Social History Social History   Tobacco Use  . Smoking status: Current Every Day Smoker    Packs/day: 0.25    Years: 13.00    Pack years: 3.25    Types: Cigarettes  . Smokeless tobacco: Never Used  Substance Use Topics  . Alcohol use: Not Currently    Comment: socially and rarely  . Drug use: No     Allergies   Patient has no known allergies.   Review of Systems Review of Systems  Gastrointestinal: Positive for abdominal pain.  All other systems reviewed and are negative.    Physical Exam Updated Vital Signs BP (!) 142/65 (BP Location: Left Arm)   Pulse (!) 105   Temp 100.3 F (37.9 C) (Oral)   Resp (!) 22   Ht 5\' 8"  (1.727 m)   Wt 127 kg   SpO2 98%   Breastfeeding No   BMI 42.57 kg/m   Physical Exam Vitals signs and nursing note reviewed.  Constitutional:      General: She is not in acute distress.    Appearance: She is well-developed. She is not diaphoretic.  HENT:     Head: Normocephalic and atraumatic.  Neck:     Musculoskeletal: Normal range of motion and neck supple.  Cardiovascular:     Rate and Rhythm: Normal rate and regular  rhythm.     Heart sounds: No murmur. No friction rub. No gallop.   Pulmonary:     Effort: Pulmonary effort is normal. No respiratory distress.     Breath sounds: Normal breath sounds. No wheezing.  Abdominal:     General: Bowel sounds are normal. There is no distension.     Palpations: Abdomen is soft.     Tenderness: There is abdominal tenderness in the left lower quadrant. There is  no right CVA tenderness, left CVA tenderness, guarding or rebound.  Musculoskeletal: Normal range of motion.  Skin:    General: Skin is warm and dry.  Neurological:     Mental Status: She is alert and oriented to person, place, and time.      ED Treatments / Results  Labs (all labs ordered are listed, but only abnormal results are displayed) Labs Reviewed  URINALYSIS, ROUTINE W REFLEX MICROSCOPIC - Abnormal; Notable for the following components:      Result Value   APPearance CLOUDY (*)    Hgb urine dipstick SMALL (*)    Ketones, ur 15 (*)    Leukocytes,Ua LARGE (*)    All other components within normal limits  URINALYSIS, MICROSCOPIC (REFLEX) - Abnormal; Notable for the following components:   Bacteria, UA MANY (*)    All other components within normal limits  URINE CULTURE  COMPREHENSIVE METABOLIC PANEL  CBC WITH DIFFERENTIAL/PLATELET    EKG None  Radiology No results found.  Procedures Procedures (including critical care time)  Medications Ordered in ED Medications  morphine 4 MG/ML injection 4 mg (has no administration in time range)  ondansetron (ZOFRAN) injection 4 mg (has no administration in time range)  acetaminophen (TYLENOL) tablet 650 mg (650 mg Oral Given 02/09/19 0103)  sodium chloride 0.9 % bolus 1,000 mL (1,000 mLs Intravenous New Bag/Given 02/09/19 0203)     Initial Impression / Assessment and Plan / ED Course  I have reviewed the triage vital signs and the nursing notes.  Pertinent labs & imaging results that were available during my care of the patient were  reviewed by me and considered in my medical decision making (see chart for details).  Patient presenting here with complaints of left lower quadrant pain.  She was recently diagnosed with what was felt to be ovarian cyst on the left.  Her pain is not improving despite the prescribed medications.  Patient returns today with ongoing pain.  Today she is febrile with an increasing leukocytosis of 18,000.  A CT scan was obtained which reveals an apparent diverticular abscess with possible colo-ovarian fistulization.  This finding was discussed with Dr. Carolynne Edouard from general surgery.  He feels as though the condition can be treated with antibiotics, admission to medicine, and IR drainage.  I have spoken with the hospitalist at [facility] long who agrees to accept the patient in transfer.  CRITICAL CARE Performed by: Geoffery Lyons Total critical care time: 45 minutes Critical care time was exclusive of separately billable procedures and treating other patients. Critical care was necessary to treat or prevent imminent or life-threatening deterioration. Critical care was time spent personally by me on the following activities: development of treatment plan with patient and/or surrogate as well as nursing, discussions with consultants, evaluation of patient's response to treatment, examination of patient, obtaining history from patient or surrogate, ordering and performing treatments and interventions, ordering and review of laboratory studies, ordering and review of radiographic studies, pulse oximetry and re-evaluation of patient's condition.   Final Clinical Impressions(s) / ED Diagnoses   Final diagnoses:  None    ED Discharge Orders    None       Geoffery Lyons, MD 02/09/19 [phone]

## 2019-02-09 NOTE — Progress Notes (Signed)
Pharmacy Antibiotic Note  Robyn [name] is a 38 y.o. female admitted on 02/09/2019 with intra-abdominal infection.  Pharmacy has been consulted for piperacillin/tazobactam dosing.  Pt has PMH significant for PCOS, presenting with abdominal pain. Surgery consulted - diverticular abscess involving L ovary. Recommending IR drain placement and IV antibiotics.   Today, 02/09/19  WBC 18 - elevated  SCr 0.65, CrCl ~100 mL/min. WNL  Tmax 101.2 F  Plan:  Continue piperacillin/tazobactam 3.375 g IV q8h EI  Renal function at baseline. Pharmacy to sign off. Will following dosing peripherally via data surveillance software.  Height: 5\' 8"  (172.7 cm) Weight: 280 lb (127 kg) IBW/kg (Calculated) : 63.9  Temp (24hrs), Avg:99.6 F (37.6 C), Min:98.3 F (36.8 C), Max:101.2 F (38.4 C)  Recent Labs  Lab 02/09/19 0150  WBC 18.0*  CREATININE 0.65    Estimated Creatinine Clearance: 132.8 mL/min (by C-G formula based on SCr of 0.65 mg/dL).    No Known Allergies  Antimicrobials this admission: Piperacillin/tazobactam 9/13 >>   Dose adjustments this admission:  Microbiology results: 9/13 UCx: Sent 9/13 SARS-2: Negative  Thank you for allowing pharmacy to be a part of this patient's care.  Lenis Noon, PharmD 02/09/2019 11:15 AM

## 2019-02-09 NOTE — ED Triage Notes (Signed)
Pt has Hx of PCOS. Reports pain LLQ since Thursday. Last BM today was normal per pt report. She has been taking ibuprofen and tramadol which are not helping

## 2019-02-09 NOTE — Consult Note (Signed)
Reason for Consult: LLQ pain Referring Physician: Dr Judd Lienelo  Robyn [name] is an 38 y.o. female.  HPI: 38 y.o. F with LLQ pain worsening over the last few weeks.  [facility].  WBC elevated to 18  Past Medical History:  Diagnosis Date  . Anxiety    h/o panic attacks  . Diabetes mellitus without complication (HCC)    diet controlled   . Herpes   . Hypertension   . Left ovarian cyst 11/29/2011  . PCOS (polycystic ovarian syndrome)   . Pregnancy induced hypertension   . Shortness of breath dyspnea    with exertion and while pregnant  . Smoker 11/29/2011    Past Surgical History:  Procedure Laterality Date  . CERVICAL CERCLAGE N/A 10/09/2014   Procedure: CERCLAGE CERVICAL;  Surgeon: Osborn CohoAngela Roberts, MD;  Location: [facility];  Service: Gynecology;  Laterality: N/A;  . CESAREAN SECTION     C/S x 2  . CESAREAN SECTION WITH BILATERAL TUBAL LIGATION Bilateral 03/23/2015   Procedure: CESAREAN SECTION WITH BILATERAL TUBAL LIGATION;  Surgeon: Osborn CohoAngela Roberts, MD;  Location: [facility];  Service: Obstetrics;  Laterality: Bilateral;  Amt OK'd to move 02/25/2015  . WISDOM TOOTH EXTRACTION      Family History  Problem Relation Age of Onset  . Hypertension Mother   . Diabetes Father   . Stroke Father   . Cancer Maternal Grandmother   . Cancer Maternal Uncle     Social History:  reports that she has been smoking cigarettes. She has a 3.25 pack-year smoking history. She has never used smokeless tobacco. She reports previous alcohol use. She reports that she does not use drugs.  Allergies: No Known Allergies  Medications: I have reviewed the patient's current medications.  Results for orders placed or performed during the hospital encounter of 02/09/19 (from the past 48 hour(s))  Urinalysis, Routine w reflex microscopic     Status: Abnormal   Collection Time: 02/09/19  1:01 AM  Result Value Ref Range   Color, Urine YELLOW YELLOW   APPearance CLOUDY (A) CLEAR   Specific Gravity, Urine  1.020 1.005 - 1.030   pH 6.5 5.0 - 8.0   Glucose, UA NEGATIVE NEGATIVE mg/dL   Hgb urine dipstick SMALL (A) NEGATIVE   Bilirubin Urine NEGATIVE NEGATIVE   Ketones, ur 15 (A) NEGATIVE mg/dL   Protein, ur NEGATIVE NEGATIVE mg/dL   Nitrite NEGATIVE NEGATIVE   Leukocytes,Ua LARGE (A) NEGATIVE    Comment: Performed at [facility] [place], [address] [place], [place] [address]  Urinalysis, Microscopic (reflex)     Status: Abnormal   Collection Time: 02/09/19  1:01 AM  Result Value Ref Range   RBC / HPF 6-10 0 - 5 RBC/hpf   WBC, UA 11-20 0 - 5 WBC/hpf   Bacteria, UA MANY (A) NONE SEEN   Squamous Epithelial / LPF 6-10 0 - 5    Comment: Performed at [facility] [place], [address] [place], [place] [address]  Pregnancy, urine     Status: None   Collection Time: 02/09/19  1:01 AM  Result Value Ref Range   Preg Test, Ur NEGATIVE NEGATIVE    Comment:        THE SENSITIVITY OF THIS METHODOLOGY IS >20 mIU/mL. Performed at [facility] [place], [address] [place], [place] [address]   Comprehensive metabolic panel     Status: Abnormal   Collection Time: 02/09/19  1:50 AM  Result Value  Ref Range   Sodium 132 (L) 135 - 145 mmol/L   Potassium 3.0 (L) 3.5 - 5.1 mmol/L   Chloride 91 (L) 98 - 111 mmol/L   CO2 28 22 - 32 mmol/L   Glucose, Bld 195 (H) 70 - 99 mg/dL   BUN 7 6 - 20 mg/dL   Creatinine, Ser 1.610.65 0.44 - 1.00 mg/dL   Calcium 9.1 8.9 - 09.610.3 mg/dL   Total Protein 7.8 6.5 - 8.1 g/dL   Albumin 3.1 (L) 3.5 - 5.0 g/dL   AST 10 (L) 15 - 41 U/L   ALT 13 0 - 44 U/L   Alkaline Phosphatase 71 38 - 126 U/L   Total Bilirubin 0.4 0.3 - 1.2 mg/dL   GFR calc non Af Amer >60 >60 mL/min   GFR calc Af Amer >60 >60 mL/min   Anion gap 13 5 - 15    Comment: Performed at [facility] [place], [address] [place], [place] [address]  CBC with Differential     Status: Abnormal   Collection Time: 02/09/19  1:50 AM  Result Value Ref Range   WBC 18.0 (H) 4.0 - 10.5 K/uL    RBC 4.12 3.87 - 5.11 MIL/uL   Hemoglobin 11.3 (L) 12.0 - 15.0 g/dL   HCT 98.135.2 (L) 19.136.0 - 47.846.0 %   MCV 85.4 80.0 - 100.0 fL   MCH 27.4 26.0 - 34.0 pg   MCHC 32.1 30.0 - 36.0 g/dL   RDW 29.514.4 62.111.5 - 30.815.5 %   Platelets 310 150 - 400 K/uL   nRBC 0.0 0.0 - 0.2 %   Neutrophils Relative % 76 %   Neutro Abs 13.6 (H) 1.7 - 7.7 K/uL   Lymphocytes Relative 16 %   Lymphs Abs 2.9 0.7 - 4.0 K/uL   Monocytes Relative 7 %   Monocytes Absolute 1.3 (H) 0.1 - 1.0 K/uL   Eosinophils Relative 0 %   Eosinophils Absolute 0.0 0.0 - 0.5 K/uL   Basophils Relative 0 %   Basophils Absolute 0.1 0.0 - 0.1 K/uL   Immature Granulocytes 1 %   Abs Immature Granulocytes 0.15 (H) 0.00 - 0.07 K/uL    Comment: Performed at [facility] [place], [address] [place], [place] [address]  SARS Coronavirus 2 [employer](Hospital order, Performed in [facility] hospital lab) Nasopharyngeal Nasopharyngeal Swab     Status: None   Collection Time: 02/09/19  4:04 AM   Specimen: Nasopharyngeal Swab  Result Value Ref Range   SARS Coronavirus 2 NEGATIVE NEGATIVE    Comment: (NOTE) If result is NEGATIVE SARS-CoV-2 target nucleic acids are NOT DETECTED. The SARS-CoV-2 RNA is generally detectable in upper and lower  respiratory specimens during the acute phase of infection. The lowest  concentration of SARS-CoV-2 viral copies this assay can detect is 250  copies / mL. A negative result does not preclude SARS-CoV-2 infection  and should not be used as the sole basis for treatment or other  patient management decisions.  A negative result may occur with  improper specimen collection / handling, submission of specimen other  than nasopharyngeal swab, presence of viral mutation(s) within the  areas targeted by this assay, and inadequate number of viral copies  (<250 copies / mL). A negative result must be combined with clinical  observations, patient history, and epidemiological information. If result is POSITIVE SARS-CoV-2 target  nucleic acids are DETECTED. The SARS-CoV-2 RNA is generally detectable in upper and lower  respiratory specimens dur ing  the acute phase of infection.  Positive  results are indicative of active infection with SARS-CoV-2.  Clinical  correlation with patient history and other diagnostic information is  necessary to determine patient infection status.  Positive results do  not rule out bacterial infection or co-infection with other viruses. If result is PRESUMPTIVE POSTIVE SARS-CoV-2 nucleic acids MAY BE PRESENT.   A presumptive positive result was obtained on the submitted specimen  and confirmed on repeat testing.  While 2019 novel coronavirus  (SARS-CoV-2) nucleic acids may be present in the submitted sample  additional confirmatory testing may be necessary for epidemiological  and / or clinical management purposes  to differentiate between  SARS-CoV-2 and other Sarbecovirus currently known to infect humans.  If clinically indicated additional testing with an alternate test  methodology [phone]) is advised. The SARS-CoV-2 RNA is generally  detectable in upper and lower respiratory sp ecimens during the acute  phase of infection. The expected result is Negative. Fact Sheet for Patients:  BoilerBrush.com.cy Fact Sheet for Healthcare Providers: https://pope.com/ This test is not yet approved or cleared by the [place] FDA and has been authorized for detection and/or diagnosis of SARS-CoV-2 by FDA under an Emergency Use Authorization (EUA).  This EUA will remain in effect (meaning this test can be used) for the duration of the COVID-19 declaration under Section 564(b)(1) of the Act, 21 U.S.C. section 360bbb-3(b)(1), unless the authorization is terminated or revoked sooner. Performed at [facility], [address] [place], [place] [address]     Ct Abdomen Pelvis W Contrast  Result Date: 02/09/2019 CLINICAL DATA:  No  diverticulitis, follow-up or though fluid EXAM: CT ABDOMEN AND PELVIS WITH CONTRAST TECHNIQUE: Multidetector CT imaging of the abdomen and pelvis was performed using the standard protocol following bolus administration of intravenous contrast. CONTRAST:  OMNIPAQUE IOHEXOL 300 MG/ML  SOLN COMPARISON:  CT 02/02/2019 FINDINGS: Lower chest: Dependent basilar atelectasis. Lung bases are otherwise clear Normal heart size. No pericardial effusion. Hepatobiliary: No focal liver abnormality is seen. No gallstones, gallbladder wall thickening, or biliary dilatation. Pancreas: Unremarkable. No pancreatic ductal dilatation or surrounding inflammatory changes. Spleen: Normal in size without focal abnormality. Adrenals/Urinary Tract: Adrenal glands are unremarkable. Lobulation tissue in the left renal hilum appears to reflect atypical region of scarring or persistent fetal lobulation, incompletely characterized on this single-phase study. Kidneys are otherwise unremarkable, without renal calculi, suspicious lesion, or hydronephrosis. Asymmetric anterior bladder wall thickening, possibly reactive. Stomach/Bowel: Distal esophagus, stomach and duodenal sweep are unremarkable. No bowel wall thickening or dilatation. There is thickening of the mid sigmoid colon and interval development of a complex, multiloculated, rim enhancing collection extending from the wall of the colon and involving the directly adjacent ovary. Conglomerate size measures approximately 7.6 x 7.2 x 8.2 cm. Extensive adjacent phlegmon and trace reactive free fluid is present. Notably, there is no discernible gas within the collection. More distal colon is largely decompressed. No evidence of resultant bowel obstruction. Vascular/Lymphatic: The aorta is normal caliber. No suspicious or enlarged lymph nodes in the included lymphatic chains. Reactive adenopathy noted in the left inguinal region. Reproductive: Anteverted uterus with slight fundal retroflexion  and features of prior Caesarean section. Right adnexa is unremarkable. Marked enlargement of the left ovary with associated rim enhancing multiloculated collection with loss of discernible fat plane along the adjacent colon and lateral aspect of the uterine fundus, further detailed on bowel section above. Other: Small amount of reactive fluid and extensive phlegmonous change in the left  lower quadrant. No bowel containing hernia. Postsurgical changes from prior low vertical and transverse abdominal incisions. Musculoskeletal: Multilevel degenerative changes are present in the imaged portions of the spine. IMPRESSION: 1. Interval development of a complex, multiloculated, rim enhancing collection extending from the wall of the sigmoid colon and involving the directly adjacent ovary and uterine adnexa. Extensive adjacent phlegmon and trace reactive free fluid. No discernible gas within the collection. Given rapid progression and concomitant leukocytosis, favor the result of diverticulitis with abscess formation and possible colo-ovarian fistulization. Alternatively, a cystic ovarian neoplasm with direct involvement of the adjacent colon and resulting superinfection could provide a similar appearance. 2. Asymmetric anterior bladder wall thickening, possibly reactive. Correlate with urinalysis to exclude cystitis. 3. Lobular soft tissue in the left renal hilum appears to reflect atypical region of scarring or persistent fetal lobulation, incompletely characterized on this single-phase study. Could consider further evaluation with renal ultrasound or MR. These results were called by telephone at the time of interpretation on 02/09/2019 at 3:18 am to provider Veryl Speak , who verbally acknowledged these results. Electronically Signed   By: Lovena Le M.D.   On: 02/09/2019 03:18    Review of Systems  Constitutional: Positive for fever. Negative for chills.  HENT: Negative for hearing loss.   Eyes: Negative for  blurred vision.  Respiratory: Negative for cough and shortness of breath.   Cardiovascular: Negative for chest pain and palpitations.  Gastrointestinal: Positive for abdominal pain. Negative for nausea and vomiting.  Genitourinary: Negative for dysuria and urgency.  Skin: Negative for itching and rash.  Neurological: Negative for dizziness and headaches.   Blood pressure 120/73, pulse 76, temperature 98.5 F (36.9 C), temperature source Oral, resp. rate 16, height 5\' 8"  (1.727 m), weight 127 kg, SpO2 96 %, not currently breastfeeding. Physical Exam  Constitutional: She is oriented to person, place, and time. She appears well-developed and well-nourished. No distress.  HENT:  Head: Normocephalic and atraumatic.  Eyes: Pupils are equal, round, and reactive to light. Conjunctivae and EOM are normal.  Neck: Normal range of motion. Neck supple.  Cardiovascular: Normal rate and regular rhythm.  Respiratory: Effort normal. No respiratory distress.  GI: Soft. She exhibits no distension. There is abdominal tenderness (LLQ).  Musculoskeletal: Normal range of motion.  Neurological: She is alert and oriented to person, place, and time.  Skin: Skin is warm and dry.    Assessment/Plan: 38 y.o. F with what appears to be diverticular abscess involving L ovary.  Rec IR drain placement.    Robyn [name] 10/28/930, 9:50 AM

## 2019-02-09 NOTE — H&P (Addendum)
Chief Complaint: LLQ pain  Referring Physician(s): Nita Sells  Supervising Physician: Daryll Brod  Patient Status: [facility] - In-pt  History of Present Illness: Robyn [name] is a 38 y.o. female with a past medical history of polycystic ovaries, diabetes, hypertension, and obesity.    She presented to the ED during the night for evaluation of left lower quadrant pain.  This is been worsening over the past 2 weeks.    CT scan showed= 1. Interval development of a complex, multiloculated, rim enhancing collection extending from the wall of the sigmoid colon and involving the directly adjacent ovary and uterine adnexa. Extensive adjacent phlegmon and trace reactive free fluid. No discernible gas within the collection. Given rapid progression and concomitant leukocytosis, favor the result of diverticulitis with abscess formation and possible colo-ovarian fistulization. Alternatively, a cystic ovarian neoplasm with direct involvement of the adjacent colon and resulting superinfection could provide a similar Appearance.  We are asked to evaluate for drain placement.  She is NPO. No blood thinners.  Past Medical History:  Diagnosis Date   Anxiety    h/o panic attacks   Diabetes mellitus without complication ([place])    diet controlled    Herpes    Hypertension    Left ovarian cyst 11/29/2011   PCOS (polycystic ovarian syndrome)    Pregnancy induced hypertension    Shortness of breath dyspnea    with exertion and while pregnant   Smoker 11/29/2011    Past Surgical History:  Procedure Laterality Date   CERVICAL CERCLAGE N/A 10/09/2014   Procedure: CERCLAGE CERVICAL;  Surgeon: Everett Graff, MD;  Location: [facility];  Service: Gynecology;  Laterality: N/A;   CESAREAN SECTION     C/S x 2   CESAREAN SECTION WITH BILATERAL TUBAL LIGATION Bilateral 03/23/2015   Procedure: CESAREAN SECTION WITH BILATERAL TUBAL LIGATION;  Surgeon: Everett Graff, MD;   Location: [facility];  Service: Obstetrics;  Laterality: Bilateral;  Amt OK'd to move 02/25/2015   WISDOM TOOTH EXTRACTION      Allergies: Patient has no known allergies.  Medications: Prior to Admission medications   Medication Sig Start Date End Date Taking? Authorizing Provider  atorvastatin (LIPITOR) 80 MG tablet Take 80 mg by mouth every evening. 12/23/18  Yes [provider]  glipiZIDE (GLUCOTROL) 5 MG tablet Take 5 mg by mouth 2 (two) times daily. 01/21/19  Yes [provider]  hydrochlorothiazide (HYDRODIURIL) 25 MG tablet Take 25 mg by mouth daily. 01/24/19  Yes [provider]  HYDROcodone-acetaminophen (NORCO/VICODIN) 5-325 MG tablet Take 1 tablet by mouth every 4 (four) hours as needed. 02/02/19  Yes Ward, Kristen N, DO  ibuprofen (ADVIL) 800 MG tablet Take 1 tablet (800 mg total) by mouth every 8 (eight) hours as needed for mild pain. 02/02/19  Yes Ward, Cyril Mourning N, DO  losartan (COZAAR) 50 MG tablet Take 50 mg by mouth daily. 01/24/19  Yes [provider]  metFORMIN (GLUCOPHAGE-XR) 500 MG 24 hr tablet Take 1,000 mg by mouth 2 (two) times daily. 12/23/18  Yes [provider]  ondansetron (ZOFRAN ODT) 4 MG disintegrating tablet Take 1 tablet (4 mg total) by mouth every 6 (six) hours as needed. 02/02/19  Yes Ward, Kristen N, DO  amLODipine (NORVASC) 5 MG tablet Take 1 tablet (5 mg total) by mouth daily. 01/19/16   Mumaw, Lauralyn Primes, DO  glucose blood (ONETOUCH VERIO) test strip 1 each by Other route 4 (four) times daily -  before meals and at bedtime. And lancets  4/day 01/13/15   Romero BellingEllison, Sean, MD  insulin aspart (NOVOLOG) 100 UNIT/ML injection Inject 40 units two times daily with meals. Patient not taking: Reported on 02/02/2019 11/05/17   Romero BellingEllison, Sean, MD     Family History  Problem Relation Age of Onset   Hypertension Mother    Diabetes Father    Stroke Father    Cancer Maternal Grandmother    Cancer Maternal Uncle     Social  History   Socioeconomic History   Marital status: Married    Spouse name: Not on file   Number of children: Not on file   Years of education: Not on file   Highest education level: Not on file  Occupational History   Not on file  Social Needs   Financial resource strain: Not on file   Food insecurity    Worry: Not on file    Inability: Not on file   Transportation needs    Medical: Not on file    Non-medical: Not on file  Tobacco Use   Smoking status: Current Every Day Smoker    Packs/day: 0.25    Years: 13.00    Pack years: 3.25    Types: Cigarettes   Smokeless tobacco: Never Used  Substance and Sexual Activity   Alcohol use: Not Currently    Comment: socially and rarely   Drug use: No   Sexual activity: Never    Partners: Male    Birth control/protection: None  Lifestyle   Physical activity    Days per week: Not on file    Minutes per session: Not on file   Stress: Not on file  Relationships   Social connections    Talks on phone: Not on file    Gets together: Not on file    Attends religious service: Not on file    Active member of club or organization: Not on file    Attends meetings of clubs or organizations: Not on file    Relationship status: Not on file  Other Topics Concern   Not on file  Social History Narrative   Not on file     Review of Systems: A 12 point ROS discussed and pertinent positives are indicated in the HPI above.  All other systems are negative.  Review of Systems  Vital Signs: BP 120/73 (BP Location: Right Arm)    Pulse 76    Temp 98.5 F (36.9 C) (Oral)    Resp 16    Ht 5\' 8"  (1.727 m)    Wt 127 kg    SpO2 96%    Breastfeeding No    BMI 42.57 kg/m   Physical Exam Vitals signs reviewed.  Constitutional:      Appearance: She is obese.  Eyes:     Extraocular Movements: Extraocular movements intact.  Neck:     Musculoskeletal: Normal range of motion.  Cardiovascular:     Rate and Rhythm: Normal rate and  regular rhythm.  Pulmonary:     Effort: Pulmonary effort is normal. No respiratory distress.     Breath sounds: Normal breath sounds.  Abdominal:     Palpations: Abdomen is soft.     Tenderness: There is abdominal tenderness.  Musculoskeletal: Normal range of motion.  Skin:    General: Skin is warm and dry.  Neurological:     General: No focal deficit present.     Mental Status: She is alert and oriented to person, place, and time.  Psychiatric:  Mood and Affect: Mood normal.        Behavior: Behavior normal.        Thought Content: Thought content normal.        Judgment: Judgment normal.     Imaging: Ct Abdomen Pelvis W Contrast  Result Date: 02/09/2019 CLINICAL DATA:  No diverticulitis, follow-up or though fluid EXAM: CT ABDOMEN AND PELVIS WITH CONTRAST TECHNIQUE: Multidetector CT imaging of the abdomen and pelvis was performed using the standard protocol following bolus administration of intravenous contrast. CONTRAST:  OMNIPAQUE IOHEXOL 300 MG/ML  SOLN COMPARISON:  CT 02/02/2019 FINDINGS: Lower chest: Dependent basilar atelectasis. Lung bases are otherwise clear Normal heart size. No pericardial effusion. Hepatobiliary: No focal liver abnormality is seen. No gallstones, gallbladder wall thickening, or biliary dilatation. Pancreas: Unremarkable. No pancreatic ductal dilatation or surrounding inflammatory changes. Spleen: Normal in size without focal abnormality. Adrenals/Urinary Tract: Adrenal glands are unremarkable. Lobulation tissue in the left renal hilum appears to reflect atypical region of scarring or persistent fetal lobulation, incompletely characterized on this single-phase study. Kidneys are otherwise unremarkable, without renal calculi, suspicious lesion, or hydronephrosis. Asymmetric anterior bladder wall thickening, possibly reactive. Stomach/Bowel: Distal esophagus, stomach and duodenal sweep are unremarkable. No bowel wall thickening or dilatation. There is  thickening of the mid sigmoid colon and interval development of a complex, multiloculated, rim enhancing collection extending from the wall of the colon and involving the directly adjacent ovary. Conglomerate size measures approximately 7.6 x 7.2 x 8.2 cm. Extensive adjacent phlegmon and trace reactive free fluid is present. Notably, there is no discernible gas within the collection. More distal colon is largely decompressed. No evidence of resultant bowel obstruction. Vascular/Lymphatic: The aorta is normal caliber. No suspicious or enlarged lymph nodes in the included lymphatic chains. Reactive adenopathy noted in the left inguinal region. Reproductive: Anteverted uterus with slight fundal retroflexion and features of prior Caesarean section. Right adnexa is unremarkable. Marked enlargement of the left ovary with associated rim enhancing multiloculated collection with loss of discernible fat plane along the adjacent colon and lateral aspect of the uterine fundus, further detailed on bowel section above. Other: Small amount of reactive fluid and extensive phlegmonous change in the left lower quadrant. No bowel containing hernia. Postsurgical changes from prior low vertical and transverse abdominal incisions. Musculoskeletal: Multilevel degenerative changes are present in the imaged portions of the spine. IMPRESSION: 1. Interval development of a complex, multiloculated, rim enhancing collection extending from the wall of the sigmoid colon and involving the directly adjacent ovary and uterine adnexa. Extensive adjacent phlegmon and trace reactive free fluid. No discernible gas within the collection. Given rapid progression and concomitant leukocytosis, favor the result of diverticulitis with abscess formation and possible colo-ovarian fistulization. Alternatively, a cystic ovarian neoplasm with direct involvement of the adjacent colon and resulting superinfection could provide a similar appearance. 2. Asymmetric  anterior bladder wall thickening, possibly reactive. Correlate with urinalysis to exclude cystitis. 3. Lobular soft tissue in the left renal hilum appears to reflect atypical region of scarring or persistent fetal lobulation, incompletely characterized on this single-phase study. Could consider further evaluation with renal ultrasound or MR. These results were called by telephone at the time of interpretation on 02/09/2019 at 3:18 am to provider Geoffery Lyons , who verbally acknowledged these results. Electronically Signed   By: Kreg Shropshire M.D.   On: 02/09/2019 03:18   Ct Renal Stone Study  Result Date: 02/02/2019 CLINICAL DATA:  Left flank pain EXAM: CT ABDOMEN AND PELVIS WITHOUT CONTRAST TECHNIQUE: Multidetector  CT imaging of the abdomen and pelvis was performed following the standard protocol without IV contrast. COMPARISON:  Pelvic ultrasound earlier today FINDINGS: Lower chest: Lung bases are clear. No effusions. Heart is normal size. Hepatobiliary: Diffuse low-density throughout the liver compatible with fatty infiltration. No focal abnormality. Gallbladder unremarkable. Pancreas: No focal abnormality or ductal dilatation. Spleen: No focal abnormality.  Normal size. Adrenals/Urinary Tract: No adrenal abnormality. No focal renal abnormality. No stones or hydronephrosis. Urinary bladder is unremarkable. Stomach/Bowel: Stomach, large and small bowel grossly unremarkable. Appendix normal. Vascular/Lymphatic: No evidence of aneurysm or adenopathy. Reproductive: The left ovary appears enlarged as seen on earlier ultrasound measuring up to 5.3 cm. Uterus and right adnexa unremarkable. Other: No free fluid or free air. Musculoskeletal: No acute bony abnormality. IMPRESSION: No renal or ureteral stones.  No hydronephrosis. Prominent left ovary as seen on ultrasound earlier today. Normal appendix. Fatty infiltration of the liver. Electronically Signed   By: Charlett NoseKevin  Dover M.D.   On: 02/02/2019 00:48   [place] Pelvic  Complete With Transvaginal  Result Date: 02/01/2019 CLINICAL DATA:  Acute left lower quadrant pain EXAM: TRANSABDOMINAL AND TRANSVAGINAL ULTRASOUND OF PELVIS TECHNIQUE: Both transabdominal and transvaginal ultrasound examinations of the pelvis were performed. Transabdominal technique was performed for global imaging of the pelvis including uterus, ovaries, adnexal regions, and pelvic cul-de-sac. It was necessary to proceed with endovaginal exam following the transabdominal exam to visualize the uterus, endometrium, ovaries and adnexa. COMPARISON:  None FINDINGS: Uterus Measurements: 9.6 x 4.5 x 5.0 cm = volume: 114 mL. No fibroids or other mass visualized. Endometrium Thickness: 8 mm in thickness. Small echogenic foci, likely endometrial calcifications. Right ovary Measurements: 3.3 x 2.6 x 2.6 cm = volume: 11.8 mL. Normal appearance/no adnexal mass. Left ovary Measurements: 5.9 x 4.0 x 4.6 cm = volume: 54.3 mL. Left ovary is enlarged but demonstrates normal color blood flow. Small follicles. No adnexal mass. Other findings No abnormal free fluid. IMPRESSION: Mildly prominent left ovary several small follicles. Normal color blood flow within the left ovary. No acute findings. Electronically Signed   By: Charlett NoseKevin  Dover M.D.   On: 02/01/2019 22:40    Labs:  CBC: Recent Labs    02/01/19 2054 02/09/19 0150  WBC 12.8* 18.0*  HGB 13.4 11.3*  HCT 41.2 35.2*  PLT 302 310    COAGS: No results for input(s): INR, APTT in the last 8760 hours.  BMP: Recent Labs    02/01/19 2054 02/09/19 0150  NA 136 132*  K 3.5 3.0*  CL 98 91*  CO2 27 28  GLUCOSE 245* 195*  BUN 13 7  CALCIUM 9.9 9.1  CREATININE 0.74 0.65  GFRNONAA >60 >60  GFRAA >60 >60    LIVER FUNCTION TESTS: Recent Labs    02/01/19 2054 02/09/19 0150  BILITOT 0.5 0.4  AST 15 10*  ALT 17 13  ALKPHOS 74 71  PROT 8.1 7.8  ALBUMIN 4.0 3.1*    TUMOR MARKERS: No results for input(s): AFPTM, CEA, CA199, CHROMGRNA in the last 8760  hours.  Assessment and Plan:  Interval development of a complex, multiloculated, rim enhancing collection extending from the wall of the sigmoid colon and involving the directly adjacent ovary and uterine adnexa.   Extensive adjacent phlegmon and trace reactive free fluid.   Given rapid progression and concomitant leukocytosis, favor the result of diverticulitis with abscess formation and possible colo-ovarian fistulization.   Images reviewed by Dr. Miles CostainShick.  Will proceed with placement of a drain today.  Risks and  benefits discussed with the patient including bleeding, infection, damage to adjacent structures, bowel perforation/fistula connection, and sepsis.  All of the patient's questions were answered, patient is agreeable to proceed. Consent signed and in chart.  Thank you for this interesting consult.  I greatly enjoyed meeting Robyn [name] and look forward to participating in their care.  A copy of this report was sent to the requesting provider on this date.  Electronically Signed: Gwynneth Macleod, PA-C   02/09/2019, 10:03 AM      I spent a total of 40 Minutes  in face to face in clinical consultation, greater than 50% of which was counseling/coordinating care for drain placement.

## 2019-02-10 DIAGNOSIS — K5720 Diverticulitis of large intestine with perforation and abscess without bleeding: Secondary | ICD-10-CM | POA: Diagnosis present

## 2019-02-10 LAB — BASIC METABOLIC PANEL
Anion gap: 11 (ref 5–15)
BUN: 7 mg/dL (ref 6–20)
CO2: 28 mmol/L (ref 22–32)
Calcium: 8.5 mg/dL — ABNORMAL LOW (ref 8.9–10.3)
Chloride: 98 mmol/L (ref 98–111)
Creatinine, Ser: 0.74 mg/dL (ref 0.44–1.00)
GFR calc Af Amer: >60 mL/min (ref >60)
GFR calc non Af Amer: >60 mL/min (ref >60)
Glucose, Bld: 125 mg/dL — ABNORMAL HIGH (ref 70–99)
Potassium: 3.2 mmol/L — ABNORMAL LOW (ref 3.5–5.1)
Sodium: 137 mmol/L (ref 135–145)

## 2019-02-10 LAB — GLUCOSE, CAPILLARY
Glucose-Capillary: 126 mg/dL — ABNORMAL HIGH (ref 70–99)
Glucose-Capillary: 114 mg/dL — ABNORMAL HIGH (ref 70–99)
Glucose-Capillary: 96 mg/dL (ref 70–99)
Glucose-Capillary: 104 mg/dL — ABNORMAL HIGH (ref 70–99)

## 2019-02-10 LAB — CBC
HCT: 35.9 % — ABNORMAL LOW (ref 36.0–46.0)
Hemoglobin: 11.1 g/dL — ABNORMAL LOW (ref 12.0–15.0)
MCH: 27.3 pg (ref 26.0–34.0)
MCHC: 30.9 g/dL (ref 30.0–36.0)
MCV: 88.2 fL (ref 80.0–100.0)
Platelets: 309 10*3/uL (ref 150–400)
RBC: 4.07 MIL/uL (ref 3.87–5.11)
RDW: 14.5 % (ref 11.5–15.5)
WBC: 12.7 10*3/uL — ABNORMAL HIGH (ref 4.0–10.5)
nRBC: 0 % (ref 0.0–0.2)

## 2019-02-10 LAB — HEMOGLOBIN A1C
Hgb A1c MFr Bld: 9 % — ABNORMAL HIGH (ref 4.8–5.6)
Mean Plasma Glucose: 212 mg/dL

## 2019-02-10 MED ORDER — SODIUM CHLORIDE 0.9% FLUSH
5.0000 mL | Freq: Three times a day (TID) | INTRAVENOUS | Status: DC
  Administered 2019-02-10 – 2019-02-14 (×13): 5 mL

## 2019-02-10 MED ORDER — HYDROCORTISONE (PERIANAL) 2.5 % EX CREA
TOPICAL_CREAM | Freq: Two times a day (BID) | CUTANEOUS | Status: DC
  Administered 2019-02-10 – 2019-02-11 (×3): via RECTAL
  Administered 2019-02-12: 1 via RECTAL
  Administered 2019-02-12 – 2019-02-13 (×2): via RECTAL
  Filled 2019-02-10: qty 28.35

## 2019-02-10 MED ORDER — POTASSIUM CHLORIDE CRYS ER 20 MEQ PO TBCR
40.0000 meq | EXTENDED_RELEASE_TABLET | Freq: Once | ORAL | Status: AC
  Administered 2019-02-10: 40 meq via ORAL
  Filled 2019-02-10: qty 2

## 2019-02-10 MED ORDER — ZOLPIDEM TARTRATE 5 MG PO TABS
5.0000 mg | ORAL_TABLET | Freq: Once | ORAL | Status: AC
  Administered 2019-02-10: 5 mg via ORAL
  Filled 2019-02-10: qty 1

## 2019-02-10 MED ORDER — OXYCODONE-ACETAMINOPHEN 5-325 MG PO TABS
1.0000 | ORAL_TABLET | ORAL | Status: AC | PRN
  Administered 2019-02-10: 1 via ORAL
  Administered 2019-02-11 (×2): 2 via ORAL
  Filled 2019-02-10 (×2): qty 2
  Filled 2019-02-10: qty 1

## 2019-02-10 NOTE — Progress Notes (Signed)
Pt states that she has a new onset of urinary frequency and was unable to make to the bathroom. Pt raised concern that it may be a new medication. Provider updated.

## 2019-02-10 NOTE — Progress Notes (Signed)
Referring Physician(s): Samtani,J/Thomas,A  Supervising Physician: Simonne Come  Patient Status:  [facility] - In-pt  Chief Complaint:  Abdominal/pelvic pain/abscess  Subjective: Pt feeling better since LLQ drain placed but has some soreness at insertion site as expected; denies N/V; has had BM; tol full liquids ok   Allergies: Patient has no known allergies.  Medications: Prior to Admission medications   Medication Sig Start Date End Date Taking? Authorizing Provider  atorvastatin (LIPITOR) 80 MG tablet Take 80 mg by mouth every evening. 12/23/18  Yes [provider]  glipiZIDE (GLUCOTROL) 5 MG tablet Take 5 mg by mouth 2 (two) times daily. 01/21/19  Yes [provider]  hydrochlorothiazide (HYDRODIURIL) 25 MG tablet Take 25 mg by mouth daily. 01/24/19  Yes [provider]  HYDROcodone-acetaminophen (NORCO/VICODIN) 5-325 MG tablet Take 1 tablet by mouth every 4 (four) hours as needed. Patient taking differently: Take 1 tablet by mouth every 4 (four) hours as needed for moderate pain.  02/02/19  Yes Ward, Kristen N, DO  ibuprofen (ADVIL) 800 MG tablet Take 1 tablet (800 mg total) by mouth every 8 (eight) hours as needed for mild pain. 02/02/19  Yes Ward, Baxter Hire N, DO  losartan (COZAAR) 50 MG tablet Take 50 mg by mouth daily. 01/24/19  Yes [provider]  metFORMIN (GLUCOPHAGE-XR) 500 MG 24 hr tablet Take 1,000 mg by mouth 2 (two) times daily. 12/23/18  Yes [provider]  ondansetron (ZOFRAN ODT) 4 MG disintegrating tablet Take 1 tablet (4 mg total) by mouth every 6 (six) hours as needed. Patient taking differently: Take 4 mg by mouth every 6 (six) hours as needed for nausea.  02/02/19  Yes Ward, Kristen N, DO  amLODipine (NORVASC) 5 MG tablet Take 1 tablet (5 mg total) by mouth daily. Patient not taking: Reported on 02/09/2019 01/19/16   Michaele Offer, DO  glucose blood [facility] VERIO) test strip 1 each by Other route 4 (four) times  daily -  before meals and at bedtime. And lancets 4/day 01/13/15   Romero Belling, MD  insulin aspart (NOVOLOG) 100 UNIT/ML injection Inject 40 units two times daily with meals. Patient not taking: Reported on 02/02/2019 11/05/17   Romero Belling, MD     Vital Signs: BP 106/63 (BP Location: Left Arm)   Pulse 78   Temp 98.3 F (36.8 C) (Oral)   Resp 16   Ht 5\' 8"  (1.727 m)   Wt 213 lb 13.5 oz (97 kg)   SpO2 98%   Breastfeeding No   BMI 32.52 kg/m   Physical Exam awake/alert; LLQ drain intact, insertion site ok, mildly tender, output 25 cc brown fluid  Imaging: Ct Abdomen Pelvis W Contrast  Result Date: 02/09/2019 CLINICAL DATA:  No diverticulitis, follow-up or though fluid EXAM: CT ABDOMEN AND PELVIS WITH CONTRAST TECHNIQUE: Multidetector CT imaging of the abdomen and pelvis was performed using the standard protocol following bolus administration of intravenous contrast. CONTRAST:  OMNIPAQUE IOHEXOL 300 MG/ML  SOLN COMPARISON:  CT 02/02/2019 FINDINGS: Lower chest: Dependent basilar atelectasis. Lung bases are otherwise clear Normal heart size. No pericardial effusion. Hepatobiliary: No focal liver abnormality is seen. No gallstones, gallbladder wall thickening, or biliary dilatation. Pancreas: Unremarkable. No pancreatic ductal dilatation or surrounding inflammatory changes. Spleen: Normal in size without focal abnormality. Adrenals/Urinary Tract: Adrenal glands are unremarkable. Lobulation tissue in the left renal hilum appears to reflect atypical region of scarring or persistent fetal lobulation, incompletely characterized on this single-phase study. Kidneys are otherwise unremarkable, without  renal calculi, suspicious lesion, or hydronephrosis. Asymmetric anterior bladder wall thickening, possibly reactive. Stomach/Bowel: Distal esophagus, stomach and duodenal sweep are unremarkable. No bowel wall thickening or dilatation. There is thickening of the mid sigmoid colon and interval  development of a complex, multiloculated, rim enhancing collection extending from the wall of the colon and involving the directly adjacent ovary. Conglomerate size measures approximately 7.6 x 7.2 x 8.2 cm. Extensive adjacent phlegmon and trace reactive free fluid is present. Notably, there is no discernible gas within the collection. More distal colon is largely decompressed. No evidence of resultant bowel obstruction. Vascular/Lymphatic: The aorta is normal caliber. No suspicious or enlarged lymph nodes in the included lymphatic chains. Reactive adenopathy noted in the left inguinal region. Reproductive: Anteverted uterus with slight fundal retroflexion and features of prior Caesarean section. Right adnexa is unremarkable. Marked enlargement of the left ovary with associated rim enhancing multiloculated collection with loss of discernible fat plane along the adjacent colon and lateral aspect of the uterine fundus, further detailed on bowel section above. Other: Small amount of reactive fluid and extensive phlegmonous change in the left lower quadrant. No bowel containing hernia. Postsurgical changes from prior low vertical and transverse abdominal incisions. Musculoskeletal: Multilevel degenerative changes are present in the imaged portions of the spine. IMPRESSION: 1. Interval development of a complex, multiloculated, rim enhancing collection extending from the wall of the sigmoid colon and involving the directly adjacent ovary and uterine adnexa. Extensive adjacent phlegmon and trace reactive free fluid. No discernible gas within the collection. Given rapid progression and concomitant leukocytosis, favor the result of diverticulitis with abscess formation and possible colo-ovarian fistulization. Alternatively, a cystic ovarian neoplasm with direct involvement of the adjacent colon and resulting superinfection could provide a similar appearance. 2. Asymmetric anterior bladder wall thickening, possibly reactive.  Correlate with urinalysis to exclude cystitis. 3. Lobular soft tissue in the left renal hilum appears to reflect atypical region of scarring or persistent fetal lobulation, incompletely characterized on this single-phase study. Could consider further evaluation with renal ultrasound or MR. These results were called by telephone at the time of interpretation on 02/09/2019 at 3:18 am to provider Veryl Speak , who verbally acknowledged these results. Electronically Signed   By: Lovena Le M.D.   On: 02/09/2019 03:18   Ct Image Guided Drainage By Percutaneous Catheter  Result Date: 02/09/2019 INDICATION: Enlarging left lower quadrant pelvic abscess, concern for diverticular abscess versus tubo-ovarian abscess EXAM: CT GUIDED DRAINAGE OF LEFT LOWER QUADRANT PELVIC ABSCESS MEDICATIONS: The patient is currently admitted to the hospital and receiving intravenous antibiotics. The antibiotics were administered within an appropriate time frame prior to the initiation of the procedure. ANESTHESIA/SEDATION: 3.0 mg IV Versed 100 mcg IV Fentanyl Moderate Sedation Time:  14 minutes The patient was continuously monitored during the procedure by the interventional radiology nurse under my direct supervision. COMPLICATIONS: None immediate. TECHNIQUE: Informed written consent was obtained from the patient after a thorough discussion of the procedural risks, benefits and alternatives. All questions were addressed. Maximal Sterile Barrier Technique was utilized including caps, mask, sterile gowns, sterile gloves, sterile drape, hand hygiene and skin antiseptic. A timeout was performed prior to the initiation of the procedure. PROCEDURE: The left lower quadrant was prepped with ChloraPrep in a sterile fashion, and a sterile drape was applied covering the operative field. A sterile gown and sterile gloves were used for the procedure. Local anesthesia was provided with 1% Lidocaine. Previous imaging reviewed. Patient positioned  supine. Noncontrast localization CT performed. The enlarging  left lower quadrant pelvic abscess adjacent to the sigmoid colon and left adnexa was localized and marked. Under sterile conditions and local anesthesia, an 18 gauge 15 cm access needle was advanced from an anterior oblique approach into the left lower quadrant pelvic abscess. Syringe aspiration yielded purulent fluid. Sample sent for cultures. Guidewire inserted followed by tract dilatation insert a 10 French drain. Drain catheter position confirmed with CT. Catheter secured with a Prolene suture. 25 cc bloody purulent fluid aspirated. Saline flush performed followed by external suction bulb. Sterile dressing applied. No immediate complication. Patient tolerated the procedure well. FINDINGS: Imaging confirms needle placement for abscess drain in the left lower quadrant. IMPRESSION: Successful CT-guided left lower quadrant pelvic abscess drain placement. Electronically Signed   By: Judie PetitM.  Shick M.D.   On: 02/09/2019 13:26    Labs:  CBC: Recent Labs    02/01/19 2054 02/09/19 0150 02/10/19 0524  WBC 12.8* 18.0* 12.7*  HGB 13.4 11.3* 11.1*  HCT 41.2 35.2* 35.9*  PLT 302 310 309    COAGS: No results for input(s): INR, APTT in the last 8760 hours.  BMP: Recent Labs    02/01/19 2054 02/09/19 0150 02/10/19 0524  NA 136 132* 137  K 3.5 3.0* 3.2*  CL 98 91* 98  CO2 27 28 28   GLUCOSE 245* 195* 125*  BUN 13 7 7   CALCIUM 9.9 9.1 8.5*  CREATININE 0.74 0.65 0.74  GFRNONAA >60 >60 >60  GFRAA >60 >60 >60    LIVER FUNCTION TESTS: Recent Labs    02/01/19 2054 02/09/19 0150  BILITOT 0.5 0.4  AST 15 10*  ALT 17 13  ALKPHOS 74 71  PROT 8.1 7.8  ALBUMIN 4.0 3.1*    Assessment and Plan: Pt with hx LLQ diverticular vs TOA abscess drain 9/13; afebrile; WBC 12.7(18), hgb stable, creat nl; K 3.2- replace; drain fluid cx- gm neg rods; cont current tx/drain irrigation/output monitoring; once output < 10 cc/day for 2-3 consecutive  days(excl flush amt) obtain f/u CT; may also need inj study prior to drain removal   Electronically Signed: D. Jeananne RamaKevin , PA-C 02/10/2019, 2:16 PM   I spent a total of 15 minutes at the the patient's bedside AND on the patient's hospital floor or unit, greater than 50% of which was counseling/coordinating care for pelvic abscess drain    Patient ID: Robyn Rubensteinasherra [name], female   DOB: 05/10/1981, 38 y.o.   MRN: [id number]

## 2019-02-10 NOTE — Progress Notes (Signed)
[facility] Progress Note     Subjective: CC-  Feeling better than yesterday. Sore around the drain otherwise abdominal pain improving. Denies n/v. Tolerating clear liquids. Loose BM this morning. WBC down 12.7, TMAX 100.8  Objective: Vital signs in last 24 hours: Temp:  [97.7 F (36.5 C)-100.8 F (38.2 C)] 99.5 F (37.5 C) (09/14 0847) Pulse Rate:  [74-84] 83 (09/14 0847) Resp:  [12-21] 18 (09/14 0847) BP: (95-142)/(55-94) 113/64 (09/14 0847) SpO2:  [90 %-100 %] 97 % (09/14 0847) Weight:  [97 kg-131.2 kg] 97 kg (09/14 0453) Last BM Date: 02/08/19  Intake/Output from previous day: 09/13 0701 - 09/14 0700 In: 1967.8 [P.O.:540; I.V.:332.1; IV Piggyback:1095.7] Out: 30 [Drains:30] Intake/Output this shift: No intake/output data recorded.  PE: Gen:  Alert, NAD, pleasant HEENT: EOM's intact, pupils equal and round Card:  RRR Pulm:  CTAB, no W/R/R,rate and  effort normal Abd: obese, soft, ND, tender around drain, +BS, no HSM, drain with purulent output Ext:  Calves soft and nontender Psych: A&Ox3  Skin: no rashes noted, warm and dry  Lab Results:  Recent Labs    02/09/19 0150 02/10/19 0524  WBC 18.0* 12.7*  HGB 11.3* 11.1*  HCT 35.2* 35.9*  PLT 310 309   BMET Recent Labs    02/09/19 0150 02/10/19 0524  NA 132* 137  K 3.0* 3.2*  CL 91* 98  CO2 28 28  GLUCOSE 195* 125*  BUN 7 7  CREATININE 0.65 0.74  CALCIUM 9.1 8.5*   PT/INR No results for input(s): LABPROT, INR in the last 72 hours. CMP     Component Value Date/Time   NA 137 02/10/2019 0524   K 3.2 (L) 02/10/2019 0524   CL 98 02/10/2019 0524   CO2 28 02/10/2019 0524   GLUCOSE 125 (H) 02/10/2019 0524   BUN 7 02/10/2019 0524   CREATININE 0.74 02/10/2019 0524   CALCIUM 8.5 (L) 02/10/2019 0524   PROT 7.8 02/09/2019 0150   ALBUMIN 3.1 (L) 02/09/2019 0150   AST 10 (L) 02/09/2019 0150   ALT 13 02/09/2019 0150   ALKPHOS 71 02/09/2019 0150   BILITOT 0.4 02/09/2019 0150   GFRNONAA >60  02/10/2019 0524   GFRAA >60 02/10/2019 0524   Lipase     Component Value Date/Time   LIPASE 25 02/01/2019 2054       Studies/Results: Ct Abdomen Pelvis W Contrast  Result Date: 02/09/2019 CLINICAL DATA:  No diverticulitis, follow-up or though fluid EXAM: CT ABDOMEN AND PELVIS WITH CONTRAST TECHNIQUE: Multidetector CT imaging of the abdomen and pelvis was performed using the standard protocol following bolus administration of intravenous contrast. CONTRAST:  100mL OMNIPAQUE IOHEXOL 300 MG/ML  SOLN COMPARISON:  CT 02/02/2019 FINDINGS: Lower chest: Dependent basilar atelectasis. Lung bases are otherwise clear Normal heart size. No pericardial effusion. Hepatobiliary: No focal liver abnormality is seen. No gallstones, gallbladder wall thickening, or biliary dilatation. Pancreas: Unremarkable. No pancreatic ductal dilatation or surrounding inflammatory changes. Spleen: Normal in size without focal abnormality. Adrenals/Urinary Tract: Adrenal glands are unremarkable. Lobulation tissue in the left renal hilum appears to reflect atypical region of scarring or persistent fetal lobulation, incompletely characterized on this single-phase study. Kidneys are otherwise unremarkable, without renal calculi, suspicious lesion, or hydronephrosis. Asymmetric anterior bladder wall thickening, possibly reactive. Stomach/Bowel: Distal esophagus, stomach and duodenal sweep are unremarkable. No bowel wall thickening or dilatation. There is thickening of the mid sigmoid colon and interval development of a complex, multiloculated, rim enhancing collection extending from the wall of the colon and  involving the directly adjacent ovary. Conglomerate size measures approximately 7.6 x 7.2 x 8.2 cm. Extensive adjacent phlegmon and trace reactive free fluid is present. Notably, there is no discernible gas within the collection. More distal colon is largely decompressed. No evidence of resultant bowel obstruction. Vascular/Lymphatic:  The aorta is normal caliber. No suspicious or enlarged lymph nodes in the included lymphatic chains. Reactive adenopathy noted in the left inguinal region. Reproductive: Anteverted uterus with slight fundal retroflexion and features of prior Caesarean section. Right adnexa is unremarkable. Marked enlargement of the left ovary with associated rim enhancing multiloculated collection with loss of discernible fat plane along the adjacent colon and lateral aspect of the uterine fundus, further detailed on bowel section above. Other: Small amount of reactive fluid and extensive phlegmonous change in the left lower quadrant. No bowel containing hernia. Postsurgical changes from prior low vertical and transverse abdominal incisions. Musculoskeletal: Multilevel degenerative changes are present in the imaged portions of the spine. IMPRESSION: 1. Interval development of a complex, multiloculated, rim enhancing collection extending from the wall of the sigmoid colon and involving the directly adjacent ovary and uterine adnexa. Extensive adjacent phlegmon and trace reactive free fluid. No discernible gas within the collection. Given rapid progression and concomitant leukocytosis, favor the result of diverticulitis with abscess formation and possible colo-ovarian fistulization. Alternatively, a cystic ovarian neoplasm with direct involvement of the adjacent colon and resulting superinfection could provide a similar appearance. 2. Asymmetric anterior bladder wall thickening, possibly reactive. Correlate with urinalysis to exclude cystitis. 3. Lobular soft tissue in the left renal hilum appears to reflect atypical region of scarring or persistent fetal lobulation, incompletely characterized on this single-phase study. Could consider further evaluation with renal ultrasound or MR. These results were called by telephone at the time of interpretation on 02/09/2019 at 3:18 am to provider Geoffery Lyons , who verbally acknowledged these  results. Electronically Signed   By: Kreg Shropshire M.D.   On: 02/09/2019 03:18   Ct Image Guided Drainage By Percutaneous Catheter  Result Date: 02/09/2019 INDICATION: Enlarging left lower quadrant pelvic abscess, concern for diverticular abscess versus tubo-ovarian abscess EXAM: CT GUIDED DRAINAGE OF LEFT LOWER QUADRANT PELVIC ABSCESS MEDICATIONS: The patient is currently admitted to the hospital and receiving intravenous antibiotics. The antibiotics were administered within an appropriate time frame prior to the initiation of the procedure. ANESTHESIA/SEDATION: 3.0 mg IV Versed 100 mcg IV Fentanyl Moderate Sedation Time:  14 minutes The patient was continuously monitored during the procedure by the interventional radiology nurse under my direct supervision. COMPLICATIONS: None immediate. TECHNIQUE: Informed written consent was obtained from the patient after a thorough discussion of the procedural risks, benefits and alternatives. All questions were addressed. Maximal Sterile Barrier Technique was utilized including caps, mask, sterile gowns, sterile gloves, sterile drape, hand hygiene and skin antiseptic. A timeout was performed prior to the initiation of the procedure. PROCEDURE: The left lower quadrant was prepped with ChloraPrep in a sterile fashion, and a sterile drape was applied covering the operative field. A sterile gown and sterile gloves were used for the procedure. Local anesthesia was provided with 1% Lidocaine. Previous imaging reviewed. Patient positioned supine. Noncontrast localization CT performed. The enlarging left lower quadrant pelvic abscess adjacent to the sigmoid colon and left adnexa was localized and marked. Under sterile conditions and local anesthesia, an 18 gauge 15 cm access needle was advanced from an anterior oblique approach into the left lower quadrant pelvic abscess. Syringe aspiration yielded purulent fluid. Sample sent for cultures. Guidewire inserted  followed by tract  dilatation insert a 10 French drain. Drain catheter position confirmed with CT. Catheter secured with a Prolene suture. 25 cc bloody purulent fluid aspirated. Saline flush performed followed by external suction bulb. Sterile dressing applied. No immediate complication. Patient tolerated the procedure well. FINDINGS: Imaging confirms needle placement for abscess drain in the left lower quadrant. IMPRESSION: Successful CT-guided left lower quadrant pelvic abscess drain placement. Electronically Signed   By: Jerilynn Mages.  Shick M.D.   On: 02/09/2019 13:26    Anti-infectives: Anti-infectives (From admission, onward)   Start     Dose/Rate Route Frequency Ordered Stop   02/09/19 1200  piperacillin-tazobactam (ZOSYN) IVPB 3.375 g     3.375 g 12.5 mL/hr over 240 Minutes Intravenous Every 8 hours 02/09/19 0600     02/09/19 0400  piperacillin-tazobactam (ZOSYN) IVPB 3.375 g     3.375 g 12.5 mL/hr over 240 Minutes Intravenous  Once 02/09/19 0355 02/09/19 0805       Assessment/Plan DM HTN Obesity  Diverticular abscess involving Left ovary - S/p perc drainage 9/13, culture showing MODERATE GRAM NEGATIVE RODS report pending - WBC trending down 12.7, TMAX 100.8  ID - zosyn 9/13>> FEN - IVF, bariatric full liquids VTE - SCDs, lovenox Foley - none Follow up - Dr. Marcello Moores  Plan - Pain improving and WBC trending down. Advance to bariatric full liquids. Continue IV zosyn, drain, and follow drain culture.   LOS: 1 day    Wellington Hampshire , [facility] 02/10/2019, 9:49 AM Pager: [phone] Mon-Thurs 7:00 am-4:30 pm Fri 7:00 am -11:30 AM Sat-Sun 7:00 am-11:30 am

## 2019-02-10 NOTE — Progress Notes (Signed)
Referred pt to Financial Counsel concerning Insurance/self pay and Medicaid. Financial Counsel was called.

## 2019-02-10 NOTE — Progress Notes (Addendum)
[facility]                                                                              Patient Demographics  Robyn [name], is a 38 y.o. female, DOB - [birth year], UKG:[id number]  Admit date - 02/09/2019   Admitting Physician Mir Marry Guan, MD  Outpatient Primary MD for the patient is Gregor Hams, FNP  Outpatient specialists:   LOS - 1  days   Medical records reviewed and are as summarized below:    Chief Complaint  Patient presents with   Abdominal Pain       Brief summary   Patient is a 38 year old female with a history of diabetes mellitus 2, PCOS, morbid obesity presented to [facility] with left lower quadrant abdominal pain worsening over the last few weeks.  CT abdomen pelvis showed left-sided diverticulitis with associated phlegmon and possible involvement of ovary  Patient was transferred to [facility] long for admission, general surgery, gynecology was consulted.  Patient was placed on IV fluids and IV Zosyn  Assessment & Plan    Principal Problem:   Colonic diverticular abscess involving the left ovary -CT abdomen showed left-sided diverticulitis with associated phlegmon and possible involvement of left ovary -General surgery and gynecology were consulted.  Recommended IR drain placement -Continue IV Zosyn, follow blood cultures -Patient underwent CT-guided drain placement on 9/13, 25 cc pus aspirated, follow cultures, preliminary showing moderate gram-negative rods -Tolerating clear liquids, denies any vomiting.  States abdominal pain improving after the drain placement however feels " sore" around the drain -Advance diet as tolerated -Leukocytosis trending down 12.7 (18.0 on admission)  Active Problems:   Obesity -Counseled on healthy lifestyle, diet and weight control    PCOS (polycystic ovarian syndrome) -Per gynecology    Hypertension  -BP soft, hold HCTZ  -Continue amlodipine, losartan    Diabetes  mellitus type 2, uncontrolled, without complications -- dx'd in 2011 -Continue only sliding scale insulin while inpatient, hold oral hypoglycemics until patient is on solids, carb modified diet  Hyperlipidemia Continue Lipitor  Morbid obesity -BMI 43 -Counseled on healthy lifestyle, diet and weight control  Hypokalemia Replaced  Code Status: Full CODE STATUS DVT Prophylaxis:  Lovenox  Family Communication: Discussed all imaging results, lab results, explained to the patient and patient's mother on the phone   Disposition Plan: Remains inpatient, until cleared by gynecology and general surgery.  Diet advanced to full liquids today.  Time Spent in minutes   35 minutes  Procedures:  CT-guided drain placement left lower quadrant 9/13  Consultants:   General surgery IR Gynecology  Antimicrobials:   Anti-infectives (From admission, onward)   Start     Dose/Rate Route Frequency Ordered Stop   02/09/19 1200  piperacillin-tazobactam (ZOSYN) IVPB 3.375 g     3.375 g 12.5 mL/hr over 240 Minutes Intravenous Every 8 hours 02/09/19 0600     02/09/19 0400  piperacillin-tazobactam (ZOSYN) IVPB 3.375 g     3.375 g 12.5 mL/hr over 240 Minutes Intravenous  Once 02/09/19 0355 02/09/19 0805         Medications  Scheduled Meds:  amLODipine  5  mg Oral Daily   atorvastatin  80 mg Oral QPM   enoxaparin (LOVENOX) injection  60 mg Subcutaneous Q24H   glipiZIDE  5 mg Oral BID AC   hydrochlorothiazide  25 mg Oral Daily   insulin aspart  0-15 Units Subcutaneous TID WC   losartan  50 mg Oral Daily   sodium chloride flush  5 mL Intracatheter Q8H   Continuous Infusions:  dextrose 5 % and 0.9 % NaCl with KCl 40 mEq/L Stopped (02/09/19 1416)   piperacillin-tazobactam (ZOSYN)  IV 3.375 g (02/10/19 0551)   PRN Meds:.acetaminophen **OR** acetaminophen, bisacodyl, HYDROcodone-acetaminophen      Subjective:   Robyn [name] was seen and examined today.  Feels better after  CT-guided drain placement yesterday.  Still feels "sore" at the site of drain placement.  No nausea or vomiting, tolerating clear liquid diet.  Patient denies dizziness, chest pain, shortness of breath, new weakness, numbess, tingling. No acute events overnight.    Objective:   Vitals:   02/10/19 0120 02/10/19 0453 02/10/19 0847 02/10/19 1148  BP: (!) 111/55 113/64 113/64 106/63  Pulse: 78 79 83 78  Resp: 20 20 18 16   Temp: 99.6 F (37.6 C) 99.5 F (37.5 C) 99.5 F (37.5 C) 98.3 F (36.8 C)  TempSrc: Oral Oral Oral Oral  SpO2: 90% 97% 97% 98%  Weight:  97 kg    Height:        Intake/Output Summary (Last 24 hours) at 02/10/2019 1231 Last data filed at 02/10/2019 1049 Gross per 24 hour  Intake 2537.05 ml  Output 31 ml  Net 2506.05 ml     Wt Readings from Last 3 Encounters:  02/10/19 97 kg  02/01/19 132 kg  12/03/17 131.5 kg     Exam  General: Alert and oriented x 3, NAD  Eyes:   HEENT:  Atraumatic, normocephalic  Cardiovascular: S1 S2 auscultated, no murmurs, RRR  Respiratory: Clear to auscultation bilaterally, no wheezing, rales or rhonchi  Gastrointestinal: Soft, LLQ drain, tender around the drain, NBS  Ext: no pedal edema bilaterally  Neuro: Strength 5/5 in upper and lower extremities bilaterally  Musculoskeletal: No digital cyanosis, clubbing  Skin: No rashes  Psych: Normal affect and demeanor, alert and oriented x3    Data Reviewed:  I have personally reviewed following labs and imaging studies  Micro Results Recent Results (from the past 240 hour(s))  Urine culture     Status: Abnormal   Collection Time: 02/01/19  8:06 PM   Specimen: Urine, Random  Result Value Ref Range Status   Specimen Description URINE, RANDOM  Final   Special Requests   Final    NONE Performed at [facility], [address] [place], [place] [address]    Culture MULTIPLE SPECIES PRESENT, SUGGEST RECOLLECTION (A)  Final   Report Status 02/03/2019 FINAL  Final    Urine culture     Status: None (Preliminary result)   Collection Time: 02/09/19  1:01 AM   Specimen: Urine, Clean Catch  Result Value Ref Range Status   Specimen Description   Final    URINE, CLEAN CATCH Performed at [facility], [address] [place], [place] [address]    Special Requests   Final    NONE Performed at [facility], [address] [place], [place] [address]    Culture   Final    CULTURE REINCUBATED FOR BETTER GROWTH Performed at [facility], [address], [place],  [place] [address]    Report Status PENDING  Incomplete  SARS Coronavirus 2 [facility] order, Performed in [facility] hospital lab) Nasopharyngeal Nasopharyngeal Swab     Status: None   Collection Time: 02/09/19  4:04 AM   Specimen: Nasopharyngeal Swab  Result Value Ref Range Status   SARS Coronavirus 2 NEGATIVE NEGATIVE Final    Comment: (NOTE) If result is NEGATIVE SARS-CoV-2 target nucleic acids are NOT DETECTED. The SARS-CoV-2 RNA is generally detectable in upper and lower  respiratory specimens during the acute phase of infection. The lowest  concentration of SARS-CoV-2 viral copies this assay can detect is 250  copies / mL. A negative result does not preclude SARS-CoV-2 infection  and should not be used as the sole basis for treatment or other  patient management decisions.  A negative result may occur with  improper specimen collection / handling, submission of specimen other  than nasopharyngeal swab, presence of viral mutation(s) within the  areas targeted by this assay, and inadequate number of viral copies  (<250 copies / mL). A negative result must be combined with clinical  observations, patient history, and epidemiological information. If result is POSITIVE SARS-CoV-2 target nucleic acids are DETECTED. The SARS-CoV-2 RNA is generally detectable in upper and lower  respiratory specimens dur ing the acute phase of infection.  Positive  results are  indicative of active infection with SARS-CoV-2.  Clinical  correlation with patient history and other diagnostic information is  necessary to determine patient infection status.  Positive results do  not rule out bacterial infection or co-infection with other viruses. If result is PRESUMPTIVE POSTIVE SARS-CoV-2 nucleic acids MAY BE PRESENT.   A presumptive positive result was obtained on the submitted specimen  and confirmed on repeat testing.  While 2019 novel coronavirus  (SARS-CoV-2) nucleic acids may be present in the submitted sample  additional confirmatory testing may be necessary for epidemiological  and / or clinical management purposes  to differentiate between  SARS-CoV-2 and other Sarbecovirus currently known to infect humans.  If clinically indicated additional testing with an alternate test  methodology [phone]) is advised. The SARS-CoV-2 RNA is generally  detectable in upper and lower respiratory sp ecimens during the acute  phase of infection. The expected result is Negative. Fact Sheet for Patients:  BoilerBrush.com.cy Fact Sheet for Healthcare Providers: https://pope.com/ This test is not yet approved or cleared by the [place] FDA and has been authorized for detection and/or diagnosis of SARS-CoV-2 by FDA under an Emergency Use Authorization (EUA).  This EUA will remain in effect (meaning this test can be used) for the duration of the COVID-19 declaration under Section 564(b)(1) of the Act, 21 U.S.C. section 360bbb-3(b)(1), unless the authorization is terminated or revoked sooner. Performed at [facility], [address] [place], [place] [address]   Body fluid culture     Status: None (Preliminary result)   Collection Time: 02/09/19  1:00 PM   Specimen: Abscess  Result Value Ref Range Status   Specimen Description   Final    ABSCESS Performed at [facility], [address], [place], [place] [address]    Special Requests   Final    NONE Performed at [facility], [address] [place], [place] [address]    Gram Stain   Final    ABUNDANT WBC PRESENT, PREDOMINANTLY PMN ABUNDANT GRAM POSITIVE COCCI IN PAIRS ABUNDANT GRAM NEGATIVE RODS RARE GRAM POSITIVE RODS Performed at [facility],  [address], [place], [place] [address]    Culture MODERATE GRAM NEGATIVE RODS  Final   Report Status PENDING  Incomplete    Radiology Reports Ct Abdomen Pelvis W Contrast  Result Date: 02/09/2019 CLINICAL DATA:  No diverticulitis, follow-up or though fluid EXAM: CT ABDOMEN AND PELVIS WITH CONTRAST TECHNIQUE: Multidetector CT imaging of the abdomen and pelvis was performed using the standard protocol following bolus administration of intravenous contrast. CONTRAST:  OMNIPAQUE IOHEXOL 300 MG/ML  SOLN COMPARISON:  CT 02/02/2019 FINDINGS: Lower chest: Dependent basilar atelectasis. Lung bases are otherwise clear Normal heart size. No pericardial effusion. Hepatobiliary: No focal liver abnormality is seen. No gallstones, gallbladder wall thickening, or biliary dilatation. Pancreas: Unremarkable. No pancreatic ductal dilatation or surrounding inflammatory changes. Spleen: Normal in size without focal abnormality. Adrenals/Urinary Tract: Adrenal glands are unremarkable. Lobulation tissue in the left renal hilum appears to reflect atypical region of scarring or persistent fetal lobulation, incompletely characterized on this single-phase study. Kidneys are otherwise unremarkable, without renal calculi, suspicious lesion, or hydronephrosis. Asymmetric anterior bladder wall thickening, possibly reactive. Stomach/Bowel: Distal esophagus, stomach and duodenal sweep are unremarkable. No bowel wall thickening or dilatation. There is thickening of the mid sigmoid colon and interval development of a complex, multiloculated, rim enhancing collection extending from the  wall of the colon and involving the directly adjacent ovary. Conglomerate size measures approximately 7.6 x 7.2 x 8.2 cm. Extensive adjacent phlegmon and trace reactive free fluid is present. Notably, there is no discernible gas within the collection. More distal colon is largely decompressed. No evidence of resultant bowel obstruction. Vascular/Lymphatic: The aorta is normal caliber. No suspicious or enlarged lymph nodes in the included lymphatic chains. Reactive adenopathy noted in the left inguinal region. Reproductive: Anteverted uterus with slight fundal retroflexion and features of prior Caesarean section. Right adnexa is unremarkable. Marked enlargement of the left ovary with associated rim enhancing multiloculated collection with loss of discernible fat plane along the adjacent colon and lateral aspect of the uterine fundus, further detailed on bowel section above. Other: Small amount of reactive fluid and extensive phlegmonous change in the left lower quadrant. No bowel containing hernia. Postsurgical changes from prior low vertical and transverse abdominal incisions. Musculoskeletal: Multilevel degenerative changes are present in the imaged portions of the spine. IMPRESSION: 1. Interval development of a complex, multiloculated, rim enhancing collection extending from the wall of the sigmoid colon and involving the directly adjacent ovary and uterine adnexa. Extensive adjacent phlegmon and trace reactive free fluid. No discernible gas within the collection. Given rapid progression and concomitant leukocytosis, favor the result of diverticulitis with abscess formation and possible colo-ovarian fistulization. Alternatively, a cystic ovarian neoplasm with direct involvement of the adjacent colon and resulting superinfection could provide a similar appearance. 2. Asymmetric anterior bladder wall thickening, possibly reactive. Correlate with urinalysis to exclude cystitis. 3. Lobular soft tissue in the left  renal hilum appears to reflect atypical region of scarring or persistent fetal lobulation, incompletely characterized on this single-phase study. Could consider further evaluation with renal ultrasound or MR. These results were called by telephone at the time of interpretation on 02/09/2019 at 3:18 am to provider Geoffery Lyons , who verbally acknowledged these results. Electronically Signed   By: Kreg Shropshire M.D.   On: 02/09/2019 03:18   Ct Renal Stone Study  Result Date: 02/02/2019 CLINICAL DATA:  Left flank pain EXAM: CT ABDOMEN AND PELVIS WITHOUT CONTRAST TECHNIQUE: Multidetector CT imaging of the abdomen and pelvis was performed following the standard protocol  without IV contrast. COMPARISON:  Pelvic ultrasound earlier today FINDINGS: Lower chest: Lung bases are clear. No effusions. Heart is normal size. Hepatobiliary: Diffuse low-density throughout the liver compatible with fatty infiltration. No focal abnormality. Gallbladder unremarkable. Pancreas: No focal abnormality or ductal dilatation. Spleen: No focal abnormality.  Normal size. Adrenals/Urinary Tract: No adrenal abnormality. No focal renal abnormality. No stones or hydronephrosis. Urinary bladder is unremarkable. Stomach/Bowel: Stomach, large and small bowel grossly unremarkable. Appendix normal. Vascular/Lymphatic: No evidence of aneurysm or adenopathy. Reproductive: The left ovary appears enlarged as seen on earlier ultrasound measuring up to 5.3 cm. Uterus and right adnexa unremarkable. Other: No free fluid or free air. Musculoskeletal: No acute bony abnormality. IMPRESSION: No renal or ureteral stones.  No hydronephrosis. Prominent left ovary as seen on ultrasound earlier today. Normal appendix. Fatty infiltration of the liver. Electronically Signed   By: Charlett NoseKevin  Dover M.D.   On: 02/02/2019 00:48   [place] Pelvic Complete With Transvaginal  Result Date: 02/01/2019 CLINICAL DATA:  Acute left lower quadrant pain EXAM: TRANSABDOMINAL AND TRANSVAGINAL  ULTRASOUND OF PELVIS TECHNIQUE: Both transabdominal and transvaginal ultrasound examinations of the pelvis were performed. Transabdominal technique was performed for global imaging of the pelvis including uterus, ovaries, adnexal regions, and pelvic cul-de-sac. It was necessary to proceed with endovaginal exam following the transabdominal exam to visualize the uterus, endometrium, ovaries and adnexa. COMPARISON:  None FINDINGS: Uterus Measurements: 9.6 x 4.5 x 5.0 cm = volume: 114 mL. No fibroids or other mass visualized. Endometrium Thickness: 8 mm in thickness. Small echogenic foci, likely endometrial calcifications. Right ovary Measurements: 3.3 x 2.6 x 2.6 cm = volume: 11.8 mL. Normal appearance/no adnexal mass. Left ovary Measurements: 5.9 x 4.0 x 4.6 cm = volume: 54.3 mL. Left ovary is enlarged but demonstrates normal color blood flow. Small follicles. No adnexal mass. Other findings No abnormal free fluid. IMPRESSION: Mildly prominent left ovary several small follicles. Normal color blood flow within the left ovary. No acute findings. Electronically Signed   By: Charlett NoseKevin  Dover M.D.   On: 02/01/2019 22:40   Ct Image Guided Drainage By Percutaneous Catheter  Result Date: 02/09/2019 INDICATION: Enlarging left lower quadrant pelvic abscess, concern for diverticular abscess versus tubo-ovarian abscess EXAM: CT GUIDED DRAINAGE OF LEFT LOWER QUADRANT PELVIC ABSCESS MEDICATIONS: The patient is currently admitted to the hospital and receiving intravenous antibiotics. The antibiotics were administered within an appropriate time frame prior to the initiation of the procedure. ANESTHESIA/SEDATION: 3.0 mg IV Versed 100 mcg IV Fentanyl Moderate Sedation Time:  14 minutes The patient was continuously monitored during the procedure by the interventional radiology nurse under my direct supervision. COMPLICATIONS: None immediate. TECHNIQUE: Informed written consent was obtained from the patient after a thorough discussion of  the procedural risks, benefits and alternatives. All questions were addressed. Maximal Sterile Barrier Technique was utilized including caps, mask, sterile gowns, sterile gloves, sterile drape, hand hygiene and skin antiseptic. A timeout was performed prior to the initiation of the procedure. PROCEDURE: The left lower quadrant was prepped with ChloraPrep in a sterile fashion, and a sterile drape was applied covering the operative field. A sterile gown and sterile gloves were used for the procedure. Local anesthesia was provided with 1% Lidocaine. Previous imaging reviewed. Patient positioned supine. Noncontrast localization CT performed. The enlarging left lower quadrant pelvic abscess adjacent to the sigmoid colon and left adnexa was localized and marked. Under sterile conditions and local anesthesia, an 18 gauge 15 cm access needle was advanced from an anterior oblique approach into  the left lower quadrant pelvic abscess. Syringe aspiration yielded purulent fluid. Sample sent for cultures. Guidewire inserted followed by tract dilatation insert a 10 French drain. Drain catheter position confirmed with CT. Catheter secured with a Prolene suture. 25 cc bloody purulent fluid aspirated. Saline flush performed followed by external suction bulb. Sterile dressing applied. No immediate complication. Patient tolerated the procedure well. FINDINGS: Imaging confirms needle placement for abscess drain in the left lower quadrant. IMPRESSION: Successful CT-guided left lower quadrant pelvic abscess drain placement. Electronically Signed   By: Judie PetitM.  Shick M.D.   On: 02/09/2019 13:26    Lab Data:  CBC: Recent Labs  Lab 02/09/19 0150 02/10/19 0524  WBC 18.0* 12.7*  NEUTROABS 13.6*  --   HGB 11.3* 11.1*  HCT 35.2* 35.9*  MCV 85.4 88.2  PLT 310 309   Basic Metabolic Panel: Recent Labs  Lab 02/09/19 0150 02/10/19 0524  NA 132* 137  K 3.0* 3.2*  CL 91* 98  CO2 28 28  GLUCOSE 195* 125*  BUN 7 7  CREATININE 0.65  0.74  CALCIUM 9.1 8.5*   GFR: Estimated Creatinine Clearance: 114.9 mL/min (by C-G formula based on SCr of 0.74 mg/dL). Liver Function Tests: Recent Labs  Lab 02/09/19 0150  AST 10*  ALT 13  ALKPHOS 71  BILITOT 0.4  PROT 7.8  ALBUMIN 3.1*   No results for input(s): LIPASE, AMYLASE in the last 168 hours. No results for input(s): AMMONIA in the last 168 hours. Coagulation Profile: No results for input(s): INR, PROTIME in the last 168 hours. Cardiac Enzymes: No results for input(s): CKTOTAL, CKMB, CKMBINDEX, TROPONINI in the last 168 hours. BNP (last 3 results) No results for input(s): PROBNP in the last 8760 hours. HbA1C: Recent Labs    02/09/19 0150  HGBA1C 9.0*   CBG: Recent Labs  Lab 02/09/19 1713 02/09/19 2200 02/10/19 0831 02/10/19 1145  GLUCAP 135* 83 126* 114*   Lipid Profile: No results for input(s): CHOL, HDL, LDLCALC, TRIG, CHOLHDL, LDLDIRECT in the last 72 hours. Thyroid Function Tests: No results for input(s): TSH, T4TOTAL, FREET4, T3FREE, THYROIDAB in the last 72 hours. Anemia Panel: No results for input(s): VITAMINB12, FOLATE, FERRITIN, TIBC, IRON, RETICCTPCT in the last 72 hours. Urine analysis:    Component Value Date/Time   COLORURINE YELLOW 02/09/2019 0101   APPEARANCEUR CLOUDY (A) 02/09/2019 0101   LABSPEC 1.020 02/09/2019 0101   PHURINE 6.5 02/09/2019 0101   GLUCOSEU NEGATIVE 02/09/2019 0101   HGBUR SMALL (A) 02/09/2019 0101   BILIRUBINUR NEGATIVE 02/09/2019 0101   KETONESUR 15 (A) 02/09/2019 0101   PROTEINUR NEGATIVE 02/09/2019 0101   UROBILINOGEN 1.0 11/22/2014 2035   NITRITE NEGATIVE 02/09/2019 0101   LEUKOCYTESUR LARGE (A) 02/09/2019 0101     Ernesto Lashway M.D. [facility] 02/10/2019, 12:31 PM  Pager: [phone] Between 7am to 7pm - call Pager - [phone]  After 7pm go to [email or web address] - password [email or web address]  Call night coverage person covering after 7pm

## 2019-02-11 LAB — BASIC METABOLIC PANEL
Anion gap: 11 (ref 5–15)
BUN: 7 mg/dL (ref 6–20)
CO2: 24 mmol/L (ref 22–32)
Calcium: 8.8 mg/dL — ABNORMAL LOW (ref 8.9–10.3)
Chloride: 102 mmol/L (ref 98–111)
Creatinine, Ser: 0.67 mg/dL (ref 0.44–1.00)
GFR calc Af Amer: >60 mL/min (ref >60)
GFR calc non Af Amer: >60 mL/min (ref >60)
Glucose, Bld: 130 mg/dL — ABNORMAL HIGH (ref 70–99)
Potassium: 3.6 mmol/L (ref 3.5–5.1)
Sodium: 137 mmol/L (ref 135–145)

## 2019-02-11 LAB — GLUCOSE, CAPILLARY
Glucose-Capillary: 130 mg/dL — ABNORMAL HIGH (ref 70–99)
Glucose-Capillary: 162 mg/dL — ABNORMAL HIGH (ref 70–99)
Glucose-Capillary: 136 mg/dL — ABNORMAL HIGH (ref 70–99)
Glucose-Capillary: 155 mg/dL — ABNORMAL HIGH (ref 70–99)

## 2019-02-11 LAB — CBC
HCT: 35.2 % — ABNORMAL LOW (ref 36.0–46.0)
Hemoglobin: 10.8 g/dL — ABNORMAL LOW (ref 12.0–15.0)
MCH: 27 pg (ref 26.0–34.0)
MCHC: 30.7 g/dL (ref 30.0–36.0)
MCV: 88 fL (ref 80.0–100.0)
Platelets: 301 10*3/uL (ref 150–400)
RBC: 4 MIL/uL (ref 3.87–5.11)
RDW: 14.5 % (ref 11.5–15.5)
WBC: 12.2 10*3/uL — ABNORMAL HIGH (ref 4.0–10.5)
nRBC: 0 % (ref 0.0–0.2)

## 2019-02-11 NOTE — Progress Notes (Signed)
Pt c/o tenderness LLQ, flushed catherter drain with 5 cc of NS no return flushed with additonal 5 cc, no return and pt voice concerns about decreased drainage and increase pain near the drain site. Dressing around changed, area c/d/i, catheter clamp open unable to close. Will update IR. SRP RN

## 2019-02-11 NOTE — Progress Notes (Addendum)
Referring Physician(s): Samtani,J/Thomas,A  Supervising Physician: Oley Balm  Patient Status:  [name] - In-pt  Chief Complaint: Abdominal/pelvic pain/abscess   Subjective: Pt doing ok today; still sore at LLQ drain site; denies N/V; tol diet ok   Allergies: Patient has no known allergies.  Medications: Prior to Admission medications   Medication Sig Start Date End Date Taking? Authorizing Provider  atorvastatin (LIPITOR) 80 MG tablet Take 80 mg by mouth every evening. 12/23/18  Yes [provider]  glipiZIDE (GLUCOTROL) 5 MG tablet Take 5 mg by mouth 2 (two) times daily. 01/21/19  Yes [provider]  hydrochlorothiazide (HYDRODIURIL) 25 MG tablet Take 25 mg by mouth daily. 01/24/19  Yes [provider]  HYDROcodone-acetaminophen (NORCO/VICODIN) 5-325 MG tablet Take 1 tablet by mouth every 4 (four) hours as needed. Patient taking differently: Take 1 tablet by mouth every 4 (four) hours as needed for moderate pain.  02/02/19  Yes Ward, Kristen N, DO  ibuprofen (ADVIL) 800 MG tablet Take 1 tablet (800 mg total) by mouth every 8 (eight) hours as needed for mild pain. 02/02/19  Yes Ward, Baxter Hire N, DO  losartan (COZAAR) 50 MG tablet Take 50 mg by mouth daily. 01/24/19  Yes [provider]  metFORMIN (GLUCOPHAGE-XR) 500 MG 24 hr tablet Take 1,000 mg by mouth 2 (two) times daily. 12/23/18  Yes [provider]  ondansetron (ZOFRAN ODT) 4 MG disintegrating tablet Take 1 tablet (4 mg total) by mouth every 6 (six) hours as needed. Patient taking differently: Take 4 mg by mouth every 6 (six) hours as needed for nausea.  02/02/19  Yes Ward, Kristen N, DO  amLODipine (NORVASC) 5 MG tablet Take 1 tablet (5 mg total) by mouth daily. Patient not taking: Reported on 02/09/2019 01/19/16   Michaele Offer, DO  glucose blood [facility] VERIO) test strip 1 each by Other route 4 (four) times daily -  before meals and at bedtime. And lancets 4/day 01/13/15    Romero Belling, MD  insulin aspart (NOVOLOG) 100 UNIT/ML injection Inject 40 units two times daily with meals. Patient not taking: Reported on 02/02/2019 11/05/17   Romero Belling, MD     Vital Signs: BP 120/67 (BP Location: Left Arm)   Pulse 84   Temp 99.3 F (37.4 C) (Oral)   Resp 20   Ht 5\' 8"  (1.727 m)   Wt 213 lb 13.5 oz (97 kg)   SpO2 92%   Breastfeeding No   BMI 32.52 kg/m   Physical Exam awake/alert; LLQ drain intact, site mildly tender, output 100 cc light brown/? feculent fluid; drain flushed with little return  Imaging: Ct Abdomen Pelvis W Contrast  Result Date: 02/09/2019 CLINICAL DATA:  No diverticulitis, follow-up or though fluid EXAM: CT ABDOMEN AND PELVIS WITH CONTRAST TECHNIQUE: Multidetector CT imaging of the abdomen and pelvis was performed using the standard protocol following bolus administration of intravenous contrast. CONTRAST:  OMNIPAQUE IOHEXOL 300 MG/ML  SOLN COMPARISON:  CT 02/02/2019 FINDINGS: Lower chest: Dependent basilar atelectasis. Lung bases are otherwise clear Normal heart size. No pericardial effusion. Hepatobiliary: No focal liver abnormality is seen. No gallstones, gallbladder wall thickening, or biliary dilatation. Pancreas: Unremarkable. No pancreatic ductal dilatation or surrounding inflammatory changes. Spleen: Normal in size without focal abnormality. Adrenals/Urinary Tract: Adrenal glands are unremarkable. Lobulation tissue in the left renal hilum appears to reflect atypical region of scarring or persistent fetal lobulation, incompletely characterized on this single-phase study. Kidneys are otherwise unremarkable, without renal calculi, suspicious lesion, or  hydronephrosis. Asymmetric anterior bladder wall thickening, possibly reactive. Stomach/Bowel: Distal esophagus, stomach and duodenal sweep are unremarkable. No bowel wall thickening or dilatation. There is thickening of the mid sigmoid colon and interval development of a complex,  multiloculated, rim enhancing collection extending from the wall of the colon and involving the directly adjacent ovary. Conglomerate size measures approximately 7.6 x 7.2 x 8.2 cm. Extensive adjacent phlegmon and trace reactive free fluid is present. Notably, there is no discernible gas within the collection. More distal colon is largely decompressed. No evidence of resultant bowel obstruction. Vascular/Lymphatic: The aorta is normal caliber. No suspicious or enlarged lymph nodes in the included lymphatic chains. Reactive adenopathy noted in the left inguinal region. Reproductive: Anteverted uterus with slight fundal retroflexion and features of prior Caesarean section. Right adnexa is unremarkable. Marked enlargement of the left ovary with associated rim enhancing multiloculated collection with loss of discernible fat plane along the adjacent colon and lateral aspect of the uterine fundus, further detailed on bowel section above. Other: Small amount of reactive fluid and extensive phlegmonous change in the left lower quadrant. No bowel containing hernia. Postsurgical changes from prior low vertical and transverse abdominal incisions. Musculoskeletal: Multilevel degenerative changes are present in the imaged portions of the spine. IMPRESSION: 1. Interval development of a complex, multiloculated, rim enhancing collection extending from the wall of the sigmoid colon and involving the directly adjacent ovary and uterine adnexa. Extensive adjacent phlegmon and trace reactive free fluid. No discernible gas within the collection. Given rapid progression and concomitant leukocytosis, favor the result of diverticulitis with abscess formation and possible colo-ovarian fistulization. Alternatively, a cystic ovarian neoplasm with direct involvement of the adjacent colon and resulting superinfection could provide a similar appearance. 2. Asymmetric anterior bladder wall thickening, possibly reactive. Correlate with urinalysis  to exclude cystitis. 3. Lobular soft tissue in the left renal hilum appears to reflect atypical region of scarring or persistent fetal lobulation, incompletely characterized on this single-phase study. Could consider further evaluation with renal ultrasound or MR. These results were called by telephone at the time of interpretation on 02/09/2019 at 3:18 am to provider Geoffery LyonsUGLAS DELO , who verbally acknowledged these results. Electronically Signed   By: Kreg ShropshirePrice  DeHay M.D.   On: 02/09/2019 03:18   Ct Image Guided Drainage By Percutaneous Catheter  Result Date: 02/09/2019 INDICATION: Enlarging left lower quadrant pelvic abscess, concern for diverticular abscess versus tubo-ovarian abscess EXAM: CT GUIDED DRAINAGE OF LEFT LOWER QUADRANT PELVIC ABSCESS MEDICATIONS: The patient is currently admitted to the hospital and receiving intravenous antibiotics. The antibiotics were administered within an appropriate time frame prior to the initiation of the procedure. ANESTHESIA/SEDATION: 3.0 mg IV Versed 100 mcg IV Fentanyl Moderate Sedation Time:  14 minutes The patient was continuously monitored during the procedure by the interventional radiology nurse under my direct supervision. COMPLICATIONS: None immediate. TECHNIQUE: Informed written consent was obtained from the patient after a thorough discussion of the procedural risks, benefits and alternatives. All questions were addressed. Maximal Sterile Barrier Technique was utilized including caps, mask, sterile gowns, sterile gloves, sterile drape, hand hygiene and skin antiseptic. A timeout was performed prior to the initiation of the procedure. PROCEDURE: The left lower quadrant was prepped with ChloraPrep in a sterile fashion, and a sterile drape was applied covering the operative field. A sterile gown and sterile gloves were used for the procedure. Local anesthesia was provided with 1% Lidocaine. Previous imaging reviewed. Patient positioned supine. Noncontrast localization  CT performed. The enlarging left lower quadrant pelvic abscess  adjacent to the sigmoid colon and left adnexa was localized and marked. Under sterile conditions and local anesthesia, an 18 gauge 15 cm access needle was advanced from an anterior oblique approach into the left lower quadrant pelvic abscess. Syringe aspiration yielded purulent fluid. Sample sent for cultures. Guidewire inserted followed by tract dilatation insert a 10 French drain. Drain catheter position confirmed with CT. Catheter secured with a Prolene suture. 25 cc bloody purulent fluid aspirated. Saline flush performed followed by external suction bulb. Sterile dressing applied. No immediate complication. Patient tolerated the procedure well. FINDINGS: Imaging confirms needle placement for abscess drain in the left lower quadrant. IMPRESSION: Successful CT-guided left lower quadrant pelvic abscess drain placement. Electronically Signed   By: Jerilynn Mages.  Shick M.D.   On: 02/09/2019 13:26    Labs:  CBC: Recent Labs    02/01/19 2054 02/09/19 0150 02/10/19 0524 02/11/19 0445  WBC 12.8* 18.0* 12.7* 12.2*  HGB 13.4 11.3* 11.1* 10.8*  HCT 41.2 35.2* 35.9* 35.2*  PLT 302 310 309 301    COAGS: No results for input(s): INR, APTT in the last 8760 hours.  BMP: Recent Labs    02/01/19 2054 02/09/19 0150 02/10/19 0524 02/11/19 0445  NA 136 132* 137 137  K 3.5 3.0* 3.2* 3.6  CL 98 91* 98 102  CO2 27 28 28 24   GLUCOSE 245* 195* 125* 130*  BUN 13 7 7 7   CALCIUM 9.9 9.1 8.5* 8.8*  CREATININE 0.74 0.65 0.74 0.67  GFRNONAA >60 >60 >60 >60  GFRAA >60 >60 >60 >60    LIVER FUNCTION TESTS: Recent Labs    02/01/19 2054 02/09/19 0150  BILITOT 0.5 0.4  AST 15 10*  ALT 17 13  ALKPHOS 74 71  PROT 8.1 7.8  ALBUMIN 4.0 3.1*    Assessment and Plan: Pt with hx LLQ diverticular vs TOA abscess drain 9/13; temp 99.3; WBC 12.2(12.7), hgb 10.8, creat nl; drain fluid cx pend(gm neg rods); cont current tx for now; drain output appears light  brown/? feculent and will need injection before removal to r/o fistula; once output minimal or if WBC rises will repeat CT (either as IP or at IR clinic); OOB/ambulate   Electronically Signed: D. Rowe Robert, PA-C 02/11/2019, 10:22 AM   I spent a total of 15 minutes at the the patient's bedside AND on the patient's hospital floor or unit, greater than 50% of which was counseling/coordinating care for pelvic abscess drain    Patient ID: Robyn [name], female   DOB: 07/17/1980, 38 y.o.   MRN: [id number]

## 2019-02-11 NOTE — Progress Notes (Signed)
[facility] Progress Note     Subjective: CC-  Patient reports slightly worse pain today than yesterday, but pain is superficial/around drain and worse with movement. Denies any increased pain with PO intake. Denies n/v. BM x3 yesterday loose.  WBC slightly down 12.2, TMAX 99.5 Drain output appears more feculent today  Objective: Vital signs in last 24 hours: Temp:  [98.3 F (36.8 C)-99.5 F (37.5 C)] 99.3 F (37.4 C) (09/15 0555) Pulse Rate:  [78-85] 84 (09/15 0555) Resp:  [16-20] 20 (09/15 0555) BP: (106-123)/(62-67) 120/67 (09/15 0555) SpO2:  [92 %-99 %] 92 % (09/15 0555) Last BM Date: 02/10/19  Intake/Output from previous day: 09/14 0701 - 09/15 0700 In: 1235.7 [P.O.:240; I.V.:832.3; IV Piggyback:153.4] Out: 101 [Drains:100; Stool:1] Intake/Output this shift: No intake/output data recorded.  PE: Gen:  Alert, NAD, pleasant HEENT: EOM's intact, pupils equal and round Pulm: rate and effort normal Abd: obese, soft, ND, tender around drain, +BS, no HSM, drain with feculent output Ext:  Calves soft and nontender Psych: A&Ox3  Skin: no rashes noted, warm and dry   Lab Results:  Recent Labs    02/10/19 0524 02/11/19 0445  WBC 12.7* 12.2*  HGB 11.1* 10.8*  HCT 35.9* 35.2*  PLT 309 301   BMET Recent Labs    02/10/19 0524 02/11/19 0445  NA 137 137  K 3.2* 3.6  CL 98 102  CO2 28 24  GLUCOSE 125* 130*  BUN 7 7  CREATININE 0.74 0.67  CALCIUM 8.5* 8.8*   PT/INR No results for input(s): LABPROT, INR in the last 72 hours. CMP     Component Value Date/Time   NA 137 02/11/2019 0445   K 3.6 02/11/2019 0445   CL 102 02/11/2019 0445   CO2 24 02/11/2019 0445   GLUCOSE 130 (H) 02/11/2019 0445   BUN 7 02/11/2019 0445   CREATININE 0.67 02/11/2019 0445   CALCIUM 8.8 (L) 02/11/2019 0445   PROT 7.8 02/09/2019 0150   ALBUMIN 3.1 (L) 02/09/2019 0150   AST 10 (L) 02/09/2019 0150   ALT 13 02/09/2019 0150   ALKPHOS 71 02/09/2019 0150   BILITOT 0.4  02/09/2019 0150   GFRNONAA >60 02/11/2019 0445   GFRAA >60 02/11/2019 0445   Lipase     Component Value Date/Time   LIPASE 25 02/01/2019 2054       Studies/Results: Ct Image Guided Drainage By Percutaneous Catheter  Result Date: 02/09/2019 INDICATION: Enlarging left lower quadrant pelvic abscess, concern for diverticular abscess versus tubo-ovarian abscess EXAM: CT GUIDED DRAINAGE OF LEFT LOWER QUADRANT PELVIC ABSCESS MEDICATIONS: The patient is currently admitted to the hospital and receiving intravenous antibiotics. The antibiotics were administered within an appropriate time frame prior to the initiation of the procedure. ANESTHESIA/SEDATION: 3.0 mg IV Versed 100 mcg IV Fentanyl Moderate Sedation Time:  14 minutes The patient was continuously monitored during the procedure by the interventional radiology nurse under my direct supervision. COMPLICATIONS: None immediate. TECHNIQUE: Informed written consent was obtained from the patient after a thorough discussion of the procedural risks, benefits and alternatives. All questions were addressed. Maximal Sterile Barrier Technique was utilized including caps, mask, sterile gowns, sterile gloves, sterile drape, hand hygiene and skin antiseptic. A timeout was performed prior to the initiation of the procedure. PROCEDURE: The left lower quadrant was prepped with ChloraPrep in a sterile fashion, and a sterile drape was applied covering the operative field. A sterile gown and sterile gloves were used for the procedure. Local anesthesia was provided with 1% Lidocaine.  Previous imaging reviewed. Patient positioned supine. Noncontrast localization CT performed. The enlarging left lower quadrant pelvic abscess adjacent to the sigmoid colon and left adnexa was localized and marked. Under sterile conditions and local anesthesia, an 18 gauge 15 cm access needle was advanced from an anterior oblique approach into the left lower quadrant pelvic abscess. Syringe  aspiration yielded purulent fluid. Sample sent for cultures. Guidewire inserted followed by tract dilatation insert a 10 French drain. Drain catheter position confirmed with CT. Catheter secured with a Prolene suture. 25 cc bloody purulent fluid aspirated. Saline flush performed followed by external suction bulb. Sterile dressing applied. No immediate complication. Patient tolerated the procedure well. FINDINGS: Imaging confirms needle placement for abscess drain in the left lower quadrant. IMPRESSION: Successful CT-guided left lower quadrant pelvic abscess drain placement. Electronically Signed   By: Jerilynn Mages.  Shick M.D.   On: 02/09/2019 13:26    Anti-infectives: Anti-infectives (From admission, onward)   Start     Dose/Rate Route Frequency Ordered Stop   02/09/19 1200  piperacillin-tazobactam (ZOSYN) IVPB 3.375 g     3.375 g 12.5 mL/hr over 240 Minutes Intravenous Every 8 hours 02/09/19 0600     02/09/19 0400  piperacillin-tazobactam (ZOSYN) IVPB 3.375 g     3.375 g 12.5 mL/hr over 240 Minutes Intravenous  Once 02/09/19 0355 02/09/19 0805       Assessment/Plan DM HTN Obesity  Diverticular abscess involving Left ovary - S/p perc drainage 9/13, culture showing MODERATE GRAM NEGATIVE RODS report pending - WBC trending down 12.2, TMAX 99.5  ID - zosyn 9/13>> FEN - IVF, soft diet VTE - SCDs, lovenox Foley - none Follow up - Dr. Marcello Moores  Plan - Ok for soft diet. Continue IV zosyn, drain, and follow drain culture. Drain output more feculent, continue to monitor output. Hopefully this will continue to improve without surgery.   LOS: 2 days    Wellington Hampshire , [facility] 02/11/2019, 8:27 AM Pager: [phone] Mon-Thurs 7:00 am-4:30 pm Fri 7:00 am -11:30 AM Sat-Sun 7:00 am-11:30 am

## 2019-02-11 NOTE — Progress Notes (Signed)
[facility]                                                                              Patient Demographics  Robyn [name], is a 38 y.o. female, DOB - 07-Apr-1981, WUJ:[id number]  Admit date - 02/09/2019   Admitting Physician Mir Marry Guan, MD  Outpatient Primary MD for the patient is Gregor Hams, FNP  Outpatient specialists:   LOS - 2  days   Medical records reviewed and are as summarized below:    Chief Complaint  Patient presents with   Abdominal Pain       Brief summary   Patient is a 38 year old female with a history of diabetes mellitus 2, PCOS, morbid obesity presented to [facility] with left lower quadrant abdominal pain worsening over the last few weeks.  CT abdomen pelvis showed left-sided diverticulitis with associated phlegmon and possible involvement of ovary  Patient was transferred to [facility] long for admission, general surgery, gynecology was consulted.  Patient was placed on IV fluids and IV Zosyn  Assessment & Plan    Principal Problem:   Colonic diverticular abscess involving the left ovary -CT abdomen showed left-sided diverticulitis with associated phlegmon and possible involvement of left ovary.  General surgery and gynecology consulted. -Patient underwent CT-guided drain placement on 9/13, 25 cc pus aspirated, cultures showing E. coli, pansensitive -Leukocytosis 12.2, continue IV Zosyn. -Patient reported feculent material in the drain, IR following closely.  May need injection before removal to rule out fistula.  If leukocytosis worsening or fevers, will repeat CT. -Diet advanced to soft solids  Active Problems:    PCOS (polycystic ovarian syndrome) -Per gynecology    Hypertension  -BP stable, continue Norvasc, losartan -Continue to hold HCTZ    Diabetes mellitus type 2, uncontrolled, without complications -- dx'd in 2011 -CBGs stable, continue sliding scale insulin while inpatient  -Hold oral  hypoglycemics.    Hyperlipidemia Continue Lipitor  Morbid obesity -BMI 43 -Counseled on healthy lifestyle, diet and weight control  Code Status: Full CODE STATUS DVT Prophylaxis:  Lovenox  Family Communication: Discussed all imaging results, lab results, explained to the patient.  Discussed with patient's mother on 9/14.   Disposition Plan: Remains inpatient, until cleared by IR, general surgery.  Time Spent in minutes   35 minutes  Procedures:  CT-guided drain placement left lower quadrant 9/13  Consultants:   General surgery IR Gynecology  Antimicrobials:   Anti-infectives (From admission, onward)   Start     Dose/Rate Route Frequency Ordered Stop   02/09/19 1200  piperacillin-tazobactam (ZOSYN) IVPB 3.375 g     3.375 g 12.5 mL/hr over 240 Minutes Intravenous Every 8 hours 02/09/19 0600     02/09/19 0400  piperacillin-tazobactam (ZOSYN) IVPB 3.375 g     3.375 g 12.5 mL/hr over 240 Minutes Intravenous  Once 02/09/19 0355 02/09/19 0805         Medications  Scheduled Meds:  amLODipine  5 mg Oral Daily   atorvastatin  80 mg Oral QPM   enoxaparin (LOVENOX) injection  60 mg Subcutaneous Q24H   hydrocortisone   Rectal BID   insulin  aspart  0-15 Units Subcutaneous TID WC   losartan  50 mg Oral Daily   sodium chloride flush  5 mL Intracatheter Q8H   Continuous Infusions:  dextrose 5 % and 0.9 % NaCl with KCl 40 mEq/L 100 mL/hr at 02/11/19 0910   piperacillin-tazobactam (ZOSYN)  IV 3.375 g (02/11/19 0553)   PRN Meds:.acetaminophen **OR** acetaminophen, bisacodyl, oxyCODONE-acetaminophen      Subjective:   Robyn [name] was seen and examined today.  Patient reports abdominal pain at the site of drain placement and also ?  Feculent material in the drain this morning.  No fevers or chills.  Patient denies dizziness, chest pain, shortness of breath, new weakness, numbess, tingling.  No acute events overnight  Objective:   Vitals:   02/10/19 1148  02/10/19 2152 02/11/19 0555 02/11/19 1315  BP: 106/63 123/62 120/67 118/66  Pulse: 78 85 84 86  Resp: 16 20 20 18   Temp: 98.3 F (36.8 C) 99 F (37.2 C) 99.3 F (37.4 C) 99 F (37.2 C)  TempSrc: Oral Oral Oral Oral  SpO2: 98% 99% 92% 97%  Weight:      Height:        Intake/Output Summary (Last 24 hours) at 02/11/2019 1453 Last data filed at 02/11/2019 1300 Gross per 24 hour  Intake 796.45 ml  Output 80 ml  Net 716.45 ml     Wt Readings from Last 3 Encounters:  02/10/19 97 kg  02/01/19 132 kg  12/03/17 131.5 kg    Physical Exam  General: Alert and oriented x 3, NAD  Eyes:   HEENT:  Atraumatic, normocephalic  Cardiovascular: S1 S2 clear, RRR. No pedal edema b/l  Respiratory: CTAB, no wheezing, rales or rhonchi  Gastrointestinal: Soft, tender around drain site, LLQ, Brownish feculent material  Ext: no pedal edema bilaterally  Neuro: no new deficits  Musculoskeletal: No cyanosis, clubbing  Skin: No rashes  Psych: Normal affect and demeanor, alert and oriented x3   Data Reviewed:  I have personally reviewed following labs and imaging studies  Micro Results Recent Results (from the past 240 hour(s))  Urine culture     Status: Abnormal   Collection Time: 02/01/19  8:06 PM   Specimen: Urine, Random  Result Value Ref Range Status   Specimen Description URINE, RANDOM  Final   Special Requests   Final    NONE Performed at [facility], [address] [place], [place] [address]    Culture MULTIPLE SPECIES PRESENT, SUGGEST RECOLLECTION (A)  Final   Report Status 02/03/2019 FINAL  Final  Urine culture     Status: None (Preliminary result)   Collection Time: 02/09/19  1:01 AM   Specimen: Urine, Clean Catch  Result Value Ref Range Status   Specimen Description   Final    URINE, CLEAN CATCH Performed at [facility] [place], [address] [place], [place] [address]    Special Requests   Final    NONE Performed at [facility] [place], [address] [place], [place] [address]    Culture   Final    Multiple bacterial morphotypes present, none predominant. Suggest appropriate recollection if clinically indicated.   Report Status PENDING  Incomplete  SARS Coronavirus 2 [facility](Hospital order, Performed in [facility] hospital lab) Nasopharyngeal Nasopharyngeal Swab     Status: None   Collection Time: 02/09/19  4:04 AM   Specimen: Nasopharyngeal Swab  Result Value Ref Range Status   SARS Coronavirus 2 NEGATIVE NEGATIVE  Final    Comment: (NOTE) If result is NEGATIVE SARS-CoV-2 target nucleic acids are NOT DETECTED. The SARS-CoV-2 RNA is generally detectable in upper and lower  respiratory specimens during the acute phase of infection. The lowest  concentration of SARS-CoV-2 viral copies this assay can detect is 250  copies / mL. A negative result does not preclude SARS-CoV-2 infection  and should not be used as the sole basis for treatment or other  patient management decisions.  A negative result may occur with  improper specimen collection / handling, submission of specimen other  than nasopharyngeal swab, presence of viral mutation(s) within the  areas targeted by this assay, and inadequate number of viral copies  (<250 copies / mL). A negative result must be combined with clinical  observations, patient history, and epidemiological information. If result is POSITIVE SARS-CoV-2 target nucleic acids are DETECTED. The SARS-CoV-2 RNA is generally detectable in upper and lower  respiratory specimens dur ing the acute phase of infection.  Positive  results are indicative of active infection with SARS-CoV-2.  Clinical  correlation with patient history and other diagnostic information is  necessary to determine patient infection status.  Positive results do  not rule out bacterial infection or co-infection with other viruses. If result is PRESUMPTIVE POSTIVE SARS-CoV-2 nucleic acids MAY BE PRESENT.   A presumptive positive result  was obtained on the submitted specimen  and confirmed on repeat testing.  While 2019 novel coronavirus  (SARS-CoV-2) nucleic acids may be present in the submitted sample  additional confirmatory testing may be necessary for epidemiological  and / or clinical management purposes  to differentiate between  SARS-CoV-2 and other Sarbecovirus currently known to infect humans.  If clinically indicated additional testing with an alternate test  methodology [phone]) is advised. The SARS-CoV-2 RNA is generally  detectable in upper and lower respiratory sp ecimens during the acute  phase of infection. The expected result is Negative. Fact Sheet for Patients:  BoilerBrush.com.cy Fact Sheet for Healthcare Providers: https://pope.com/ This test is not yet approved or cleared by the [place] FDA and has been authorized for detection and/or diagnosis of SARS-CoV-2 by FDA under an Emergency Use Authorization (EUA).  This EUA will remain in effect (meaning this test can be used) for the duration of the COVID-19 declaration under Section 564(b)(1) of the Act, 21 U.S.C. section 360bbb-3(b)(1), unless the authorization is terminated or revoked sooner. Performed at [facility], [address] [place], [place] [address]   Body fluid culture     Status: None (Preliminary result)   Collection Time: 02/09/19  1:00 PM   Specimen: Abscess  Result Value Ref Range Status   Specimen Description   Final    ABSCESS Performed at [facility], [address], [place], [place] [address]    Special Requests   Final    NONE Performed at [facility], [address] [place], [place] [address]    Gram Stain   Final    ABUNDANT WBC PRESENT, PREDOMINANTLY PMN ABUNDANT GRAM POSITIVE COCCI IN PAIRS ABUNDANT GRAM NEGATIVE RODS RARE GRAM POSITIVE RODS    Culture   Final    MODERATE ESCHERICHIA COLI CULTURE  REINCUBATED FOR BETTER GROWTH Performed at [facility], [address], [place], [place] [address]    Report Status PENDING  Incomplete   Organism ID, Bacteria ESCHERICHIA COLI  Final      Susceptibility   Escherichia coli - MIC*  AMPICILLIN 4 SENSITIVE Sensitive     CEFAZOLIN <=4 SENSITIVE Sensitive     CEFEPIME <=1 SENSITIVE Sensitive     CEFTAZIDIME <=1 SENSITIVE Sensitive     CEFTRIAXONE <=1 SENSITIVE Sensitive     CIPROFLOXACIN <=0.25 SENSITIVE Sensitive     GENTAMICIN <=1 SENSITIVE Sensitive     IMIPENEM <=0.25 SENSITIVE Sensitive     TRIMETH/SULFA <=20 SENSITIVE Sensitive     AMPICILLIN/SULBACTAM <=2 SENSITIVE Sensitive     PIP/TAZO <=4 SENSITIVE Sensitive     Extended ESBL NEGATIVE Sensitive     * MODERATE ESCHERICHIA COLI    Radiology Reports Ct Abdomen Pelvis W Contrast  Result Date: 02/09/2019 CLINICAL DATA:  No diverticulitis, follow-up or though fluid EXAM: CT ABDOMEN AND PELVIS WITH CONTRAST TECHNIQUE: Multidetector CT imaging of the abdomen and pelvis was performed using the standard protocol following bolus administration of intravenous contrast. CONTRAST:  100mL OMNIPAQUE IOHEXOL 300 MG/ML  SOLN COMPARISON:  CT 02/02/2019 FINDINGS: Lower chest: Dependent basilar atelectasis. Lung bases are otherwise clear Normal heart size. No pericardial effusion. Hepatobiliary: No focal liver abnormality is seen. No gallstones, gallbladder wall thickening, or biliary dilatation. Pancreas: Unremarkable. No pancreatic ductal dilatation or surrounding inflammatory changes. Spleen: Normal in size without focal abnormality. Adrenals/Urinary Tract: Adrenal glands are unremarkable. Lobulation tissue in the left renal hilum appears to reflect atypical region of scarring or persistent fetal lobulation, incompletely characterized on this single-phase study. Kidneys are otherwise unremarkable, without renal calculi, suspicious lesion, or hydronephrosis. Asymmetric anterior bladder wall  thickening, possibly reactive. Stomach/Bowel: Distal esophagus, stomach and duodenal sweep are unremarkable. No bowel wall thickening or dilatation. There is thickening of the mid sigmoid colon and interval development of a complex, multiloculated, rim enhancing collection extending from the wall of the colon and involving the directly adjacent ovary. Conglomerate size measures approximately 7.6 x 7.2 x 8.2 cm. Extensive adjacent phlegmon and trace reactive free fluid is present. Notably, there is no discernible gas within the collection. More distal colon is largely decompressed. No evidence of resultant bowel obstruction. Vascular/Lymphatic: The aorta is normal caliber. No suspicious or enlarged lymph nodes in the included lymphatic chains. Reactive adenopathy noted in the left inguinal region. Reproductive: Anteverted uterus with slight fundal retroflexion and features of prior Caesarean section. Right adnexa is unremarkable. Marked enlargement of the left ovary with associated rim enhancing multiloculated collection with loss of discernible fat plane along the adjacent colon and lateral aspect of the uterine fundus, further detailed on bowel section above. Other: Small amount of reactive fluid and extensive phlegmonous change in the left lower quadrant. No bowel containing hernia. Postsurgical changes from prior low vertical and transverse abdominal incisions. Musculoskeletal: Multilevel degenerative changes are present in the imaged portions of the spine. IMPRESSION: 1. Interval development of a complex, multiloculated, rim enhancing collection extending from the wall of the sigmoid colon and involving the directly adjacent ovary and uterine adnexa. Extensive adjacent phlegmon and trace reactive free fluid. No discernible gas within the collection. Given rapid progression and concomitant leukocytosis, favor the result of diverticulitis with abscess formation and possible colo-ovarian fistulization.  Alternatively, a cystic ovarian neoplasm with direct involvement of the adjacent colon and resulting superinfection could provide a similar appearance. 2. Asymmetric anterior bladder wall thickening, possibly reactive. Correlate with urinalysis to exclude cystitis. 3. Lobular soft tissue in the left renal hilum appears to reflect atypical region of scarring or persistent fetal lobulation, incompletely characterized on this single-phase study. Could consider further evaluation with renal ultrasound or MR. These results  were called by telephone at the time of interpretation on 02/09/2019 at 3:18 am to provider Geoffery Lyons , who verbally acknowledged these results. Electronically Signed   By: Kreg Shropshire M.D.   On: 02/09/2019 03:18   Ct Renal Stone Study  Result Date: 02/02/2019 CLINICAL DATA:  Left flank pain EXAM: CT ABDOMEN AND PELVIS WITHOUT CONTRAST TECHNIQUE: Multidetector CT imaging of the abdomen and pelvis was performed following the standard protocol without IV contrast. COMPARISON:  Pelvic ultrasound earlier today FINDINGS: Lower chest: Lung bases are clear. No effusions. Heart is normal size. Hepatobiliary: Diffuse low-density throughout the liver compatible with fatty infiltration. No focal abnormality. Gallbladder unremarkable. Pancreas: No focal abnormality or ductal dilatation. Spleen: No focal abnormality.  Normal size. Adrenals/Urinary Tract: No adrenal abnormality. No focal renal abnormality. No stones or hydronephrosis. Urinary bladder is unremarkable. Stomach/Bowel: Stomach, large and small bowel grossly unremarkable. Appendix normal. Vascular/Lymphatic: No evidence of aneurysm or adenopathy. Reproductive: The left ovary appears enlarged as seen on earlier ultrasound measuring up to 5.3 cm. Uterus and right adnexa unremarkable. Other: No free fluid or free air. Musculoskeletal: No acute bony abnormality. IMPRESSION: No renal or ureteral stones.  No hydronephrosis. Prominent left ovary as seen  on ultrasound earlier today. Normal appendix. Fatty infiltration of the liver. Electronically Signed   By: Charlett Nose M.D.   On: 02/02/2019 00:48   US Pelvic Complete With Transvaginal  Result Date: 02/01/2019 CLINICAL DATA:  Acute left lower quadrant pain EXAM: TRANSABDOMINAL AND TRANSVAGINAL ULTRASOUND OF PELVIS TECHNIQUE: Both transabdominal and transvaginal ultrasound examinations of the pelvis were performed. Transabdominal technique was performed for global imaging of the pelvis including uterus, ovaries, adnexal regions, and pelvic cul-de-sac. It was necessary to proceed with endovaginal exam following the transabdominal exam to visualize the uterus, endometrium, ovaries and adnexa. COMPARISON:  None FINDINGS: Uterus Measurements: 9.6 x 4.5 x 5.0 cm = volume: 114 mL. No fibroids or other mass visualized. Endometrium Thickness: 8 mm in thickness. Small echogenic foci, likely endometrial calcifications. Right ovary Measurements: 3.3 x 2.6 x 2.6 cm = volume: 11.8 mL. Normal appearance/no adnexal mass. Left ovary Measurements: 5.9 x 4.0 x 4.6 cm = volume: 54.3 mL. Left ovary is enlarged but demonstrates normal color blood flow. Small follicles. No adnexal mass. Other findings No abnormal free fluid. IMPRESSION: Mildly prominent left ovary several small follicles. Normal color blood flow within the left ovary. No acute findings. Electronically Signed   By: Charlett Nose M.D.   On: 02/01/2019 22:40   Ct Image Guided Drainage By Percutaneous Catheter  Result Date: 02/09/2019 INDICATION: Enlarging left lower quadrant pelvic abscess, concern for diverticular abscess versus tubo-ovarian abscess EXAM: CT GUIDED DRAINAGE OF LEFT LOWER QUADRANT PELVIC ABSCESS MEDICATIONS: The patient is currently admitted to the hospital and receiving intravenous antibiotics. The antibiotics were administered within an appropriate time frame prior to the initiation of the procedure. ANESTHESIA/SEDATION: 3.0 mg IV Versed 100 mcg IV  Fentanyl Moderate Sedation Time:  14 minutes The patient was continuously monitored during the procedure by the interventional radiology nurse under my direct supervision. COMPLICATIONS: None immediate. TECHNIQUE: Informed written consent was obtained from the patient after a thorough discussion of the procedural risks, benefits and alternatives. All questions were addressed. Maximal Sterile Barrier Technique was utilized including caps, mask, sterile gowns, sterile gloves, sterile drape, hand hygiene and skin antiseptic. A timeout was performed prior to the initiation of the procedure. PROCEDURE: The left lower quadrant was prepped with ChloraPrep in a sterile fashion, and a  sterile drape was applied covering the operative field. A sterile gown and sterile gloves were used for the procedure. Local anesthesia was provided with 1% Lidocaine. Previous imaging reviewed. Patient positioned supine. Noncontrast localization CT performed. The enlarging left lower quadrant pelvic abscess adjacent to the sigmoid colon and left adnexa was localized and marked. Under sterile conditions and local anesthesia, an 18 gauge 15 cm access needle was advanced from an anterior oblique approach into the left lower quadrant pelvic abscess. Syringe aspiration yielded purulent fluid. Sample sent for cultures. Guidewire inserted followed by tract dilatation insert a 10 French drain. Drain catheter position confirmed with CT. Catheter secured with a Prolene suture. 25 cc bloody purulent fluid aspirated. Saline flush performed followed by external suction bulb. Sterile dressing applied. No immediate complication. Patient tolerated the procedure well. FINDINGS: Imaging confirms needle placement for abscess drain in the left lower quadrant. IMPRESSION: Successful CT-guided left lower quadrant pelvic abscess drain placement. Electronically Signed   By: Judie Petit.  Shick M.D.   On: 02/09/2019 13:26    Lab Data:  CBC: Recent Labs  Lab  02/09/19 0150 02/10/19 0524 02/11/19 0445  WBC 18.0* 12.7* 12.2*  NEUTROABS 13.6*  --   --   HGB 11.3* 11.1* 10.8*  HCT 35.2* 35.9* 35.2*  MCV 85.4 88.2 88.0  PLT 310 309 301   Basic Metabolic Panel: Recent Labs  Lab 02/09/19 0150 02/10/19 0524 02/11/19 0445  NA 132* 137 137  K 3.0* 3.2* 3.6  CL 91* 98 102  CO2 28 28 24   GLUCOSE 195* 125* 130*  BUN 7 7 7   CREATININE 0.65 0.74 0.67  CALCIUM 9.1 8.5* 8.8*   GFR: Estimated Creatinine Clearance: 114.9 mL/min (by C-G formula based on SCr of 0.67 mg/dL). Liver Function Tests: Recent Labs  Lab 02/09/19 0150  AST 10*  ALT 13  ALKPHOS 71  BILITOT 0.4  PROT 7.8  ALBUMIN 3.1*   No results for input(s): LIPASE, AMYLASE in the last 168 hours. No results for input(s): AMMONIA in the last 168 hours. Coagulation Profile: No results for input(s): INR, PROTIME in the last 168 hours. Cardiac Enzymes: No results for input(s): CKTOTAL, CKMB, CKMBINDEX, TROPONINI in the last 168 hours. BNP (last 3 results) No results for input(s): PROBNP in the last 8760 hours. HbA1C: Recent Labs    02/09/19 0150  HGBA1C 9.0*   CBG: Recent Labs  Lab 02/10/19 1145 02/10/19 1659 02/10/19 2210 02/11/19 0758 02/11/19 1203  GLUCAP 114* 96 104* 130* 162*   Lipid Profile: No results for input(s): CHOL, HDL, LDLCALC, TRIG, CHOLHDL, LDLDIRECT in the last 72 hours. Thyroid Function Tests: No results for input(s): TSH, T4TOTAL, FREET4, T3FREE, THYROIDAB in the last 72 hours. Anemia Panel: No results for input(s): VITAMINB12, FOLATE, FERRITIN, TIBC, IRON, RETICCTPCT in the last 72 hours. Urine analysis:    Component Value Date/Time   COLORURINE YELLOW 02/09/2019 0101   APPEARANCEUR CLOUDY (A) 02/09/2019 0101   LABSPEC 1.020 02/09/2019 0101   PHURINE 6.5 02/09/2019 0101   GLUCOSEU NEGATIVE 02/09/2019 0101   HGBUR SMALL (A) 02/09/2019 0101   BILIRUBINUR NEGATIVE 02/09/2019 0101   KETONESUR 15 (A) 02/09/2019 0101   PROTEINUR NEGATIVE  02/09/2019 0101   UROBILINOGEN 1.0 11/22/2014 2035   NITRITE NEGATIVE 02/09/2019 0101   LEUKOCYTESUR LARGE (A) 02/09/2019 0101     Daequan Kozma M.D. [facility] 02/11/2019, 2:53 PM  Pager: [phone] Between 7am to 7pm - call Pager - [phone]  After 7pm go to [email or web address] - password [facility]  Call  night coverage person covering after 7pm

## 2019-02-12 ENCOUNTER — Inpatient Hospital Stay (HOSPITAL_COMMUNITY): Payer: Medicaid Other

## 2019-02-12 ENCOUNTER — Encounter (HOSPITAL_COMMUNITY): Payer: Self-pay | Admitting: Radiology

## 2019-02-12 DIAGNOSIS — E1165 Type 2 diabetes mellitus with hyperglycemia: Secondary | ICD-10-CM

## 2019-02-12 DIAGNOSIS — K5720 Diverticulitis of large intestine with perforation and abscess without bleeding: Principal | ICD-10-CM

## 2019-02-12 DIAGNOSIS — E282 Polycystic ovarian syndrome: Secondary | ICD-10-CM

## 2019-02-12 DIAGNOSIS — K5792 Diverticulitis of intestine, part unspecified, without perforation or abscess without bleeding: Secondary | ICD-10-CM

## 2019-02-12 DIAGNOSIS — L0291 Cutaneous abscess, unspecified: Secondary | ICD-10-CM

## 2019-02-12 DIAGNOSIS — I10 Essential (primary) hypertension: Secondary | ICD-10-CM

## 2019-02-12 LAB — BASIC METABOLIC PANEL
Anion gap: 8 (ref 5–15)
BUN: 8 mg/dL (ref 6–20)
CO2: 22 mmol/L (ref 22–32)
Calcium: 8.8 mg/dL — ABNORMAL LOW (ref 8.9–10.3)
Chloride: 107 mmol/L (ref 98–111)
Creatinine, Ser: 0.65 mg/dL (ref 0.44–1.00)
GFR calc Af Amer: >60 mL/min (ref >60)
GFR calc non Af Amer: >60 mL/min (ref >60)
Glucose, Bld: 147 mg/dL — ABNORMAL HIGH (ref 70–99)
Potassium: 4.4 mmol/L (ref 3.5–5.1)
Sodium: 137 mmol/L (ref 135–145)

## 2019-02-12 LAB — BODY FLUID CULTURE

## 2019-02-12 LAB — CBC
HCT: 35.6 % — ABNORMAL LOW (ref 36.0–46.0)
Hemoglobin: 10.8 g/dL — ABNORMAL LOW (ref 12.0–15.0)
MCH: 27.1 pg (ref 26.0–34.0)
MCHC: 30.3 g/dL (ref 30.0–36.0)
MCV: 89.4 fL (ref 80.0–100.0)
Platelets: 323 10*3/uL (ref 150–400)
RBC: 3.98 MIL/uL (ref 3.87–5.11)
RDW: 14.5 % (ref 11.5–15.5)
WBC: 9 10*3/uL (ref 4.0–10.5)
nRBC: 0 % (ref 0.0–0.2)

## 2019-02-12 LAB — GLUCOSE, CAPILLARY
Glucose-Capillary: 154 mg/dL — ABNORMAL HIGH (ref 70–99)
Glucose-Capillary: 179 mg/dL — ABNORMAL HIGH (ref 70–99)
Glucose-Capillary: 119 mg/dL — ABNORMAL HIGH (ref 70–99)
Glucose-Capillary: 154 mg/dL — ABNORMAL HIGH (ref 70–99)

## 2019-02-12 MED ORDER — SODIUM CHLORIDE (PF) 0.9 % IJ SOLN
INTRAMUSCULAR | Status: AC
  Filled 2019-02-12: qty 50

## 2019-02-12 MED ORDER — IOHEXOL 300 MG/ML  SOLN
100.0000 mL | Freq: Once | INTRAMUSCULAR | Status: AC | PRN
  Administered 2019-02-12: 100 mL via INTRAVENOUS

## 2019-02-12 NOTE — Progress Notes (Signed)
[facility] Progress Note     Subjective: CC-  Feels about the same as yesterday. Abdomen still a little sore around the drain but otherwise ok. Denies abdominal pain with PO intake. Tolerating soft diet. No BM yesterday. WBC down 9, TMAX 99 Drain output down to 15cc in the last 24 hours. Drainage is still milky but lighter and less brown-tinged today  Objective: Vital signs in last 24 hours: Temp:  [98.2 F (36.8 C)-99 F (37.2 C)] 98.3 F (36.8 C) (09/16 0452) Pulse Rate:  [72-86] 72 (09/16 0452) Resp:  [18-20] 18 (09/16 0452) BP: (106-118)/(66-71) 106/66 (09/16 0452) SpO2:  [97 %] 97 % (09/16 0452) Weight:  [131.2 kg] 131.2 kg (09/15 1600) Last BM Date: 02/10/19  Intake/Output from previous day: 09/15 0701 - 09/16 0700 In: 140 [P.O.:120] Out: 15 [Drains:15] Intake/Output this shift: No intake/output data recorded.  PE: Gen: Alert, NAD, pleasant HEENT: EOM's intact, pupils equal and round Pulm: rate and effort normal CHE:NIDPO, soft,ND, tender around drain and suprapubic region, +BS, no HSM, drain with milky but lighter and less brown-tinged output EUM:PNTIRW soft and nontender Psych: A&Ox3  Skin: no rashes noted, warm and dry    Lab Results:  Recent Labs    02/11/19 0445 02/12/19 0413  WBC 12.2* 9.0  HGB 10.8* 10.8*  HCT 35.2* 35.6*  PLT 301 323   BMET Recent Labs    02/11/19 0445 02/12/19 0413  NA 137 137  K 3.6 4.4  CL 102 107  CO2 24 22  GLUCOSE 130* 147*  BUN 7 8  CREATININE 0.67 0.65  CALCIUM 8.8* 8.8*   PT/INR No results for input(s): LABPROT, INR in the last 72 hours. CMP     Component Value Date/Time   NA 137 02/12/2019 0413   K 4.4 02/12/2019 0413   CL 107 02/12/2019 0413   CO2 22 02/12/2019 0413   GLUCOSE 147 (H) 02/12/2019 0413   BUN 8 02/12/2019 0413   CREATININE 0.65 02/12/2019 0413   CALCIUM 8.8 (L) 02/12/2019 0413   PROT 7.8 02/09/2019 0150   ALBUMIN 3.1 (L) 02/09/2019 0150   AST 10 (L) 02/09/2019 0150    ALT 13 02/09/2019 0150   ALKPHOS 71 02/09/2019 0150   BILITOT 0.4 02/09/2019 0150   GFRNONAA >60 02/12/2019 0413   GFRAA >60 02/12/2019 0413   Lipase     Component Value Date/Time   LIPASE 25 02/01/2019 2054       Studies/Results: No results found.  Anti-infectives: Anti-infectives (From admission, onward)   Start     Dose/Rate Route Frequency Ordered Stop   02/09/19 1200  piperacillin-tazobactam (ZOSYN) IVPB 3.375 g     3.375 g 12.5 mL/hr over 240 Minutes Intravenous Every 8 hours 02/09/19 0600     02/09/19 0400  piperacillin-tazobactam (ZOSYN) IVPB 3.375 g     3.375 g 12.5 mL/hr over 240 Minutes Intravenous  Once 02/09/19 0355 02/09/19 0805       Assessment/Plan DM HTN Obesity  Diverticular abscess involving Leftovary - S/p perc drainage 9/13 >> culture showingE COLI pansensitive  - WBC 9, TMAX 99  ID -zosyn 9/13>> FEN -IVF,soft diet VTE -SCDs, lovenox Foley -none Follow up -Dr. Dola Factor has ordered repeat CT scan for today, will watch for results. Continue soft diet, IV zosyn, and drain.    LOS: 3 days    Wellington Hampshire , [facility] Surgery 02/12/2019, 9:01 AM Pager: [phone] Mon-Thurs 7:00 am-4:30 pm Fri 7:00 am -11:30 AM Sat-Sun  7:00 am-11:30 am

## 2019-02-12 NOTE — Progress Notes (Signed)
PROGRESS NOTE  Robyn [name] BLT:[id number] DOB: 09-11-1980 DOA: 02/09/2019 PCP: Maudie Flakes, FNP   LOS: 3 days   Brief narrative: Patient is a 38 year old female with a history of diabetes mellitus 2, PCOS, morbid obesity presented to med [facility] with left lower quadrant abdominal pain worsening over the last few weeks.  CT abdomen pelvis showed left-sided diverticulitis with associated phlegmon and possible involvement of ovary. Patient was transferred to [facility] long for admission, general surgery, gynecology was consulted.  Patient was placed on IV fluids and IV Zosyn  Assessment/Plan:  Principal Problem:   Colonic diverticular abscess Active Problems:   Obesity   PCOS (polycystic ovarian syndrome)   Hypertension - on Labetalol 200 mg po bid   Diabetes mellitus type 2, uncontrolled, without complications -- dx'd in 2011   S/P repeat low transverse C-section   Diverticulitis  Colonic diverticular abscess involving the left ovary -CT abdomen showed left-sided diverticulitis with associated phlegmon and possible involvement of left ovary.  General surgery and gynecology consulted. Patient underwent CT-guided drain placement on 9/13, 25 cc pus aspirated, cultures showing E. coli, pansensitive.  Afebrile at this time.  WBC 9.0 from 12.2.  Continue IV Zosyn. -Leukocytosis 12.2, continue IV Zosyn.  Surgery on board closely following.  Plan for CT scan repeat for monitoring.  Decreased output noted at 15 mL..  Currently on soft diet.    PCOS (polycystic ovarian syndrome) Gynecology was consulted.  Essential hypertension  -BP stable, continue Norvasc, losartan. hold HCTZ for now.  Blood pressure seems to be stable.  Will closely monitor.    Diabetes mellitus type 2, uncontrolled, without complications Continue to hold oral hypoglycemics.  Continue sliding scale insulin Accu-Cheks diabetic diet.  Blood glucose levels seems to be reasonably controlled.   Hyperlipidemia Continue Lipitor  Morbid obesity -BMI 43, -Counseled on healthy lifestyle, diet and weight control   VTE Prophylaxis: Lovenox  Code Status: Full code  Family Communication: None today  Disposition Plan: Home likely in 2 to 3 days, CT scan today.  Will follow IR/ general surgery recommendations on discharge.   Consultants:  General surgery,   interventional radiology,   gynecology  Procedures:  CT-guided drain placement of the left lower quadrant on 02/09/2019  Antibiotics: Anti-infectives (From admission, onward)   Start     Dose/Rate Route Frequency Ordered Stop   02/09/19 1200  piperacillin-tazobactam (ZOSYN) IVPB 3.375 g     3.375 g 12.5 mL/hr over 240 Minutes Intravenous Every 8 hours 02/09/19 0600     02/09/19 0400  piperacillin-tazobactam (ZOSYN) IVPB 3.375 g     3.375 g 12.5 mL/hr over 240 Minutes Intravenous  Once 02/09/19 0355 02/09/19 0805      Subjective: Denies abdominal pain, nausea or vomiting.   Tolerating soft diet.  No fever, chills or rigor.  Complains of mild soreness around the drain area.  Objective: Vitals:   02/11/19 2124 02/12/19 0452  BP: 116/71 106/66  Pulse: 80 72  Resp: 20 18  Temp: 98.2 F (36.8 C) 98.3 F (36.8 C)  SpO2: 97% 97%    Intake/Output Summary (Last 24 hours) at 02/12/2019 1216 Last data filed at 02/12/2019 0930 Gross per 24 hour  Intake 20 ml  Output 13 ml  Net 7 ml   Filed Weights   02/09/19 1458 02/10/19 0453 02/11/19 1600  Weight: 131.2 kg 97 kg 131.2 kg   Body mass index is 43.97 kg/m.   Physical Exam: GENERAL: Patient is alert awake and oriented. Not  in obvious distress.  Morbidly obese. HENT: No scleral pallor or icterus. Pupils equally reactive to light. Oral mucosa is moist NECK: is supple, no palpable thyroid enlargement. CHEST: Clear to auscultation. No crackles or wheezes. Non tender on palpation. Diminished breath sounds bilaterally. CVS: S1 and S2 heard, no murmur. Regular  rate and rhythm. No pericardial rub. ABDOMEN: Soft, obese abdomen, left lower quadrant drain in place with milky drainage, bowel sounds are present. No palpable hepato-splenomegaly. EXTREMITIES: No edema. CNS: Cranial nerves are intact. No focal motor or sensory deficits. SKIN: warm and dry without rashes.  Data Review: I have personally reviewed the following laboratory data and studies,  CBC: Recent Labs  Lab 02/09/19 0150 02/10/19 0524 02/11/19 0445 02/12/19 0413  WBC 18.0* 12.7* 12.2* 9.0  NEUTROABS 13.6*  --   --   --   HGB 11.3* 11.1* 10.8* 10.8*  HCT 35.2* 35.9* 35.2* 35.6*  MCV 85.4 88.2 88.0 89.4  PLT 310 309 301 323   Basic Metabolic Panel: Recent Labs  Lab 02/09/19 0150 02/10/19 0524 02/11/19 0445 02/12/19 0413  NA 132* 137 137 137  K 3.0* 3.2* 3.6 4.4  CL 91* 98 102 107  CO2 28 28 24 22   GLUCOSE 195* 125* 130* 147*  BUN 7 7 7 8   CREATININE 0.65 0.74 0.67 0.65  CALCIUM 9.1 8.5* 8.8* 8.8*   Liver Function Tests: Recent Labs  Lab 02/09/19 0150  AST 10*  ALT 13  ALKPHOS 71  BILITOT 0.4  PROT 7.8  ALBUMIN 3.1*   No results for input(s): LIPASE, AMYLASE in the last 168 hours. No results for input(s): AMMONIA in the last 168 hours. Cardiac Enzymes: No results for input(s): CKTOTAL, CKMB, CKMBINDEX, TROPONINI in the last 168 hours. BNP (last 3 results) No results for input(s): BNP in the last 8760 hours.  ProBNP (last 3 results) No results for input(s): PROBNP in the last 8760 hours.  CBG: Recent Labs  Lab 02/11/19 1203 02/11/19 1656 02/11/19 2204 02/12/19 0826 02/12/19 1208  GLUCAP 162* 136* 155* 154* 179*   Recent Results (from the past 240 hour(s))  Urine culture     Status: None (Preliminary result)   Collection Time: 02/09/19  1:01 AM   Specimen: Urine, Clean Catch  Result Value Ref Range Status   Specimen Description   Final    URINE, CLEAN CATCH Performed at [facility] [place], [address] [place], [place] [address]     Special Requests   Final    NONE Performed at [facility] [place], [address] [place], [place] [address]    Culture   Final    Multiple bacterial morphotypes present, none predominant. Suggest appropriate recollection if clinically indicated.   Report Status PENDING  Incomplete  SARS Coronavirus 2 [facility](Hospital order, Performed in [facility] hospital lab) Nasopharyngeal Nasopharyngeal Swab     Status: None   Collection Time: 02/09/19  4:04 AM   Specimen: Nasopharyngeal Swab  Result Value Ref Range Status   SARS Coronavirus 2 NEGATIVE NEGATIVE Final    Comment: (NOTE) If result is NEGATIVE SARS-CoV-2 target nucleic acids are NOT DETECTED. The SARS-CoV-2 RNA is generally detectable in upper and lower  respiratory specimens during the acute phase of infection. The lowest  concentration of SARS-CoV-2 viral copies this assay can detect is 250  copies / mL. A negative result does not preclude SARS-CoV-2 infection  and should not be used as the sole basis for treatment or other  patient  management decisions.  A negative result may occur with  improper specimen collection / handling, submission of specimen other  than nasopharyngeal swab, presence of viral mutation(s) within the  areas targeted by this assay, and inadequate number of viral copies  (<250 copies / mL). A negative result must be combined with clinical  observations, patient history, and epidemiological information. If result is POSITIVE SARS-CoV-2 target nucleic acids are DETECTED. The SARS-CoV-2 RNA is generally detectable in upper and lower  respiratory specimens dur ing the acute phase of infection.  Positive  results are indicative of active infection with SARS-CoV-2.  Clinical  correlation with patient history and other diagnostic information is  necessary to determine patient infection status.  Positive results do  not rule out bacterial infection or co-infection with other viruses. If result is PRESUMPTIVE  POSTIVE SARS-CoV-2 nucleic acids MAY BE PRESENT.   A presumptive positive result was obtained on the submitted specimen  and confirmed on repeat testing.  While 2019 novel coronavirus  (SARS-CoV-2) nucleic acids may be present in the submitted sample  additional confirmatory testing may be necessary for epidemiological  and / or clinical management purposes  to differentiate between  SARS-CoV-2 and other Sarbecovirus currently known to infect humans.  If clinically indicated additional testing with an alternate test  methodology [id number]) is advised. The SARS-CoV-2 RNA is generally  detectable in upper and lower respiratory sp ecimens during the acute  phase of infection. The expected result is Negative. Fact Sheet for Patients:  BoilerBrush.com.cyhttps://www.fda.gov/media/136312/download Fact Sheet for Healthcare Providers: https://pope.com/https://www.fda.gov/media/136313/download This test is not yet approved or cleared by the [place] FDA and has been authorized for detection and/or diagnosis of SARS-CoV-2 by FDA under an Emergency Use Authorization (EUA).  This EUA will remain in effect (meaning this test can be used) for the duration of the COVID-19 declaration under Section 564(b)(1) of the Act, 21 U.S.C. section 360bbb-3(b)(1), unless the authorization is terminated or revoked sooner. Performed at [facility] [place], [address] [place], [place] [address]   Body fluid culture     Status: None (Preliminary result)   Collection Time: 02/09/19  1:00 PM   Specimen: Abscess  Result Value Ref Range Status   Specimen Description   Final    ABSCESS Performed at [facility], [address] [place], [place] [address]    Special Requests   Final    NONE Performed at [facility], [address] [place], [place] [address]    Gram Stain   Final    ABUNDANT WBC PRESENT, PREDOMINANTLY PMN ABUNDANT GRAM POSITIVE COCCI IN PAIRS ABUNDANT GRAM NEGATIVE RODS RARE  GRAM POSITIVE RODS Performed at [facility], [address] [place], [place] [address]    Culture   Final    MODERATE ESCHERICHIA COLI ABUNDANT STREPTOCOCCUS ANGINOSIS MIXED ANAEROBIC FLORA PRESENT.  CALL LAB IF FURTHER IID REQUIRED.    Report Status PENDING  Incomplete   Organism ID, Bacteria ESCHERICHIA COLI  Final      Susceptibility   Escherichia coli - MIC*    AMPICILLIN 4 SENSITIVE Sensitive     CEFAZOLIN <=4 SENSITIVE Sensitive     CEFEPIME <=1 SENSITIVE Sensitive     CEFTAZIDIME <=1 SENSITIVE Sensitive     CEFTRIAXONE <=1 SENSITIVE Sensitive     CIPROFLOXACIN <=0.25 SENSITIVE Sensitive     GENTAMICIN <=1 SENSITIVE Sensitive     IMIPENEM <=0.25 SENSITIVE Sensitive     TRIMETH/SULFA <=20 SENSITIVE Sensitive  AMPICILLIN/SULBACTAM <=2 SENSITIVE Sensitive     PIP/TAZO <=4 SENSITIVE Sensitive     Extended ESBL NEGATIVE Sensitive     * MODERATE ESCHERICHIA COLI     Studies: No results found.  Scheduled Meds: . amLODipine  5 mg Oral Daily  . atorvastatin  80 mg Oral QPM  . enoxaparin (LOVENOX) injection  60 mg Subcutaneous Q24H  . hydrocortisone   Rectal BID  . insulin aspart  0-15 Units Subcutaneous TID WC  . losartan  50 mg Oral Daily  . sodium chloride flush  5 mL Intracatheter Q8H    Continuous Infusions: . dextrose 5 % and 0.9 % NaCl with KCl 40 mEq/L 100 mL/hr at 02/12/19 0942  . piperacillin-tazobactam (ZOSYN)  IV 3.375 g (02/12/19 0456)     Flora Lipps, MD  [facility] 02/12/2019

## 2019-02-12 NOTE — Progress Notes (Signed)
Referring Physician(s): Samtani,J/Thomas,A  Supervising Physician: Ruel Favors  Patient Status:  [facility] - In-pt  Chief Complaint: Abdominal/pelvic pain/abscess   Subjective: Pt feeling ok today; has some LLQ soreness but not worsening; tol diet ok   Allergies: Patient has no known allergies.  Medications: Prior to Admission medications   Medication Sig Start Date End Date Taking? Authorizing Provider  atorvastatin (LIPITOR) 80 MG tablet Take 80 mg by mouth every evening. 12/23/18  Yes [provider]  glipiZIDE (GLUCOTROL) 5 MG tablet Take 5 mg by mouth 2 (two) times daily. 01/21/19  Yes [provider]  hydrochlorothiazide (HYDRODIURIL) 25 MG tablet Take 25 mg by mouth daily. 01/24/19  Yes [provider]  HYDROcodone-acetaminophen (NORCO/VICODIN) 5-325 MG tablet Take 1 tablet by mouth every 4 (four) hours as needed. Patient taking differently: Take 1 tablet by mouth every 4 (four) hours as needed for moderate pain.  02/02/19  Yes Ward, Kristen N, DO  ibuprofen (ADVIL) 800 MG tablet Take 1 tablet (800 mg total) by mouth every 8 (eight) hours as needed for mild pain. 02/02/19  Yes Ward, Baxter Hire N, DO  losartan (COZAAR) 50 MG tablet Take 50 mg by mouth daily. 01/24/19  Yes [provider]  metFORMIN (GLUCOPHAGE-XR) 500 MG 24 hr tablet Take 1,000 mg by mouth 2 (two) times daily. 12/23/18  Yes [provider]  ondansetron (ZOFRAN ODT) 4 MG disintegrating tablet Take 1 tablet (4 mg total) by mouth every 6 (six) hours as needed. Patient taking differently: Take 4 mg by mouth every 6 (six) hours as needed for nausea.  02/02/19  Yes Ward, Kristen N, DO  amLODipine (NORVASC) 5 MG tablet Take 1 tablet (5 mg total) by mouth daily. Patient not taking: Reported on 02/09/2019 01/19/16   Michaele Offer, DO  glucose blood [facility] VERIO) test strip 1 each by Other route 4 (four) times daily -  before meals and at bedtime. And lancets 4/day 01/13/15    Romero Belling, MD  insulin aspart (NOVOLOG) 100 UNIT/ML injection Inject 40 units two times daily with meals. Patient not taking: Reported on 02/02/2019 11/05/17   Romero Belling, MD     Vital Signs: BP (!) 98/59 (BP Location: Right Arm)    Pulse 66    Temp 98.1 F (36.7 C) (Oral)    Resp 18    Ht 5\' 8"  (1.727 m)    Wt 289 lb 3.2 oz (131.2 kg)    LMP 01/21/2019    SpO2 100%    Breastfeeding No Comment: negative urine pregnancy test 02-09-2019   BMI 43.97 kg/m   Physical Exam awake/alert; LLQ drain intact, output 15 cc light brown fluid; drain flushed with return of equal vol of flush amt  Imaging: Ct Abdomen Pelvis W Contrast  Result Date: 02/12/2019 CLINICAL DATA:  Left lower quadrant abdominal pain around abscess drainage. EXAM: CT ABDOMEN AND PELVIS WITH CONTRAST TECHNIQUE: Multidetector CT imaging of the abdomen and pelvis was performed using the standard protocol following bolus administration of intravenous contrast. CONTRAST:  OMNIPAQUE IOHEXOL 300 MG/ML  SOLN COMPARISON:  CT scan of February 09, 2019. FINDINGS: Lower chest: No acute abnormality. Hepatobiliary: No focal liver abnormality is seen. No gallstones, gallbladder wall thickening, or biliary dilatation. Pancreas: Unremarkable. No pancreatic ductal dilatation or surrounding inflammatory changes. Spleen: Normal in size without focal abnormality. Adrenals/Urinary Tract: Adrenal glands are unremarkable. Kidneys are normal, without renal calculi, focal lesion, or hydronephrosis. Bladder is unremarkable. Stomach/Bowel: The stomach appears normal. The appendix  appears normal. There is no evidence of bowel obstruction. Inflammatory changes are noted around the proximal sigmoid colon consistent with diverticulitis. Pigtail drainage catheter is seen in the para diverticular abscess. The abscess is significantly decreased in size, with its superior portion measuring only 4.5 x 4.2 cm currently. Small amount of residual fluid remains, with the  interval development of air within the cavity. The inferior portion is slightly smaller. Vascular/Lymphatic: No significant vascular findings are present. No enlarged abdominal or pelvic lymph nodes. Reproductive: The uterus is unremarkable. Right adnexal region is unremarkable. Left adnexal region appears to be involved in the inflammation described above. Other: No abdominal wall hernia or abnormality. No abdominopelvic ascites. Musculoskeletal: No acute or significant osseous findings. IMPRESSION: Interval placement of drainage catheter into abscess adjacent to sigmoid diverticulitis. The abscess is significantly decreased in size, particularly its superior portion, with only a small amount of residual fluid remaining in the superior portion, with slightly decreased amount of fluid seen in the inferior portion. Electronically Signed   By: Lupita RaiderJames  Green Jr M.D.   On: 02/12/2019 15:35   Ct Abdomen Pelvis W Contrast  Result Date: 02/09/2019 CLINICAL DATA:  No diverticulitis, follow-up or though fluid EXAM: CT ABDOMEN AND PELVIS WITH CONTRAST TECHNIQUE: Multidetector CT imaging of the abdomen and pelvis was performed using the standard protocol following bolus administration of intravenous contrast. CONTRAST:  100mL OMNIPAQUE IOHEXOL 300 MG/ML  SOLN COMPARISON:  CT 02/02/2019 FINDINGS: Lower chest: Dependent basilar atelectasis. Lung bases are otherwise clear Normal heart size. No pericardial effusion. Hepatobiliary: No focal liver abnormality is seen. No gallstones, gallbladder wall thickening, or biliary dilatation. Pancreas: Unremarkable. No pancreatic ductal dilatation or surrounding inflammatory changes. Spleen: Normal in size without focal abnormality. Adrenals/Urinary Tract: Adrenal glands are unremarkable. Lobulation tissue in the left renal hilum appears to reflect atypical region of scarring or persistent fetal lobulation, incompletely characterized on this single-phase study. Kidneys are otherwise  unremarkable, without renal calculi, suspicious lesion, or hydronephrosis. Asymmetric anterior bladder wall thickening, possibly reactive. Stomach/Bowel: Distal esophagus, stomach and duodenal sweep are unremarkable. No bowel wall thickening or dilatation. There is thickening of the mid sigmoid colon and interval development of a complex, multiloculated, rim enhancing collection extending from the wall of the colon and involving the directly adjacent ovary. Conglomerate size measures approximately 7.6 x 7.2 x 8.2 cm. Extensive adjacent phlegmon and trace reactive free fluid is present. Notably, there is no discernible gas within the collection. More distal colon is largely decompressed. No evidence of resultant bowel obstruction. Vascular/Lymphatic: The aorta is normal caliber. No suspicious or enlarged lymph nodes in the included lymphatic chains. Reactive adenopathy noted in the left inguinal region. Reproductive: Anteverted uterus with slight fundal retroflexion and features of prior Caesarean section. Right adnexa is unremarkable. Marked enlargement of the left ovary with associated rim enhancing multiloculated collection with loss of discernible fat plane along the adjacent colon and lateral aspect of the uterine fundus, further detailed on bowel section above. Other: Small amount of reactive fluid and extensive phlegmonous change in the left lower quadrant. No bowel containing hernia. Postsurgical changes from prior low vertical and transverse abdominal incisions. Musculoskeletal: Multilevel degenerative changes are present in the imaged portions of the spine. IMPRESSION: 1. Interval development of a complex, multiloculated, rim enhancing collection extending from the wall of the sigmoid colon and involving the directly adjacent ovary and uterine adnexa. Extensive adjacent phlegmon and trace reactive free fluid. No discernible gas within the collection. Given rapid progression and concomitant leukocytosis,  favor the result of diverticulitis with abscess formation and possible colo-ovarian fistulization. Alternatively, a cystic ovarian neoplasm with direct involvement of the adjacent colon and resulting superinfection could provide a similar appearance. 2. Asymmetric anterior bladder wall thickening, possibly reactive. Correlate with urinalysis to exclude cystitis. 3. Lobular soft tissue in the left renal hilum appears to reflect atypical region of scarring or persistent fetal lobulation, incompletely characterized on this single-phase study. Could consider further evaluation with renal ultrasound or MR. These results were called by telephone at the time of interpretation on 02/09/2019 at 3:18 am to provider Veryl Speak , who verbally acknowledged these results. Electronically Signed   By: Lovena Le M.D.   On: 02/09/2019 03:18   Ct Image Guided Drainage By Percutaneous Catheter  Result Date: 02/09/2019 INDICATION: Enlarging left lower quadrant pelvic abscess, concern for diverticular abscess versus tubo-ovarian abscess EXAM: CT GUIDED DRAINAGE OF LEFT LOWER QUADRANT PELVIC ABSCESS MEDICATIONS: The patient is currently admitted to the hospital and receiving intravenous antibiotics. The antibiotics were administered within an appropriate time frame prior to the initiation of the procedure. ANESTHESIA/SEDATION: 3.0 mg IV Versed 100 mcg IV Fentanyl Moderate Sedation Time:  14 minutes The patient was continuously monitored during the procedure by the interventional radiology nurse under my direct supervision. COMPLICATIONS: None immediate. TECHNIQUE: Informed written consent was obtained from the patient after a thorough discussion of the procedural risks, benefits and alternatives. All questions were addressed. Maximal Sterile Barrier Technique was utilized including caps, mask, sterile gowns, sterile gloves, sterile drape, hand hygiene and skin antiseptic. A timeout was performed prior to the initiation of the  procedure. PROCEDURE: The left lower quadrant was prepped with ChloraPrep in a sterile fashion, and a sterile drape was applied covering the operative field. A sterile gown and sterile gloves were used for the procedure. Local anesthesia was provided with 1% Lidocaine. Previous imaging reviewed. Patient positioned supine. Noncontrast localization CT performed. The enlarging left lower quadrant pelvic abscess adjacent to the sigmoid colon and left adnexa was localized and marked. Under sterile conditions and local anesthesia, an 18 gauge 15 cm access needle was advanced from an anterior oblique approach into the left lower quadrant pelvic abscess. Syringe aspiration yielded purulent fluid. Sample sent for cultures. Guidewire inserted followed by tract dilatation insert a 10 French drain. Drain catheter position confirmed with CT. Catheter secured with a Prolene suture. 25 cc bloody purulent fluid aspirated. Saline flush performed followed by external suction bulb. Sterile dressing applied. No immediate complication. Patient tolerated the procedure well. FINDINGS: Imaging confirms needle placement for abscess drain in the left lower quadrant. IMPRESSION: Successful CT-guided left lower quadrant pelvic abscess drain placement. Electronically Signed   By: Jerilynn Mages.  Shick M.D.   On: 02/09/2019 13:26    Labs:  CBC: Recent Labs    02/09/19 0150 02/10/19 0524 02/11/19 0445 02/12/19 0413  WBC 18.0* 12.7* 12.2* 9.0  HGB 11.3* 11.1* 10.8* 10.8*  HCT 35.2* 35.9* 35.2* 35.6*  PLT 310 309 301 323    COAGS: No results for input(s): INR, APTT in the last 8760 hours.  BMP: Recent Labs    02/09/19 0150 02/10/19 0524 02/11/19 0445 02/12/19 0413  NA 132* 137 137 137  K 3.0* 3.2* 3.6 4.4  CL 91* 98 102 107  CO2 28 28 24 22   GLUCOSE 195* 125* 130* 147*  BUN 7 7 7 8   CALCIUM 9.1 8.5* 8.8* 8.8*  CREATININE 0.65 0.74 0.67 0.65  GFRNONAA >60 >60 >60 >60  GFRAA >60 >60 >  60 >60    LIVER FUNCTION TESTS: Recent  Labs    02/01/19 2054 02/09/19 0150  BILITOT 0.5 0.4  AST 15 10*  ALT 17 13  ALKPHOS 74 71  PROT 8.1 7.8  ALBUMIN 4.0 3.1*    Assessment and Plan: Pt with hx LLQ diverticular vs TOA abscess drain 9/13; afebrile; WBC nl, hgb stable, creat nl; drain fluid cx- e coli/strept anginosis; f/u CT today reveals sig decrease in size of drained collection; images reviewed by Dr. Miles CostainShick- cont current tx; will obtain f/u CT/ drain injection likely as OP at IR clinic next week   Electronically Signed: D. Jeananne RamaKevin Saki Legore, PA-C 02/12/2019, 4:08 PM   I spent a total of 15 minutes at the the patient's bedside AND on the patient's hospital floor or unit, greater than 50% of which was counseling/coordinating care for pelvic abscess drain    Patient ID: Shelly Rubensteinasherra Fillingim, female   DOB: 01-11-1981, 38 y.o.   MRN: [id number]

## 2019-02-13 LAB — COMPREHENSIVE METABOLIC PANEL
ALT: 23 U/L (ref 0–44)
AST: 23 U/L (ref 15–41)
Albumin: 2.8 g/dL — ABNORMAL LOW (ref 3.5–5.0)
Alkaline Phosphatase: 62 U/L (ref 38–126)
Anion gap: 5 (ref 5–15)
BUN: 7 mg/dL (ref 6–20)
CO2: 22 mmol/L (ref 22–32)
Calcium: 8.8 mg/dL — ABNORMAL LOW (ref 8.9–10.3)
Chloride: 111 mmol/L (ref 98–111)
Creatinine, Ser: 0.73 mg/dL (ref 0.44–1.00)
GFR calc Af Amer: >60 mL/min (ref >60)
GFR calc non Af Amer: >60 mL/min (ref >60)
Glucose, Bld: 165 mg/dL — ABNORMAL HIGH (ref 70–99)
Potassium: 4.4 mmol/L (ref 3.5–5.1)
Sodium: 138 mmol/L (ref 135–145)
Total Bilirubin: 0.2 mg/dL — ABNORMAL LOW (ref 0.3–1.2)
Total Protein: 7.1 g/dL (ref 6.5–8.1)

## 2019-02-13 LAB — GLUCOSE, CAPILLARY
Glucose-Capillary: 158 mg/dL — ABNORMAL HIGH (ref 70–99)
Glucose-Capillary: 182 mg/dL — ABNORMAL HIGH (ref 70–99)
Glucose-Capillary: 91 mg/dL (ref 70–99)
Glucose-Capillary: 150 mg/dL — ABNORMAL HIGH (ref 70–99)

## 2019-02-13 LAB — CBC
HCT: 33.1 % — ABNORMAL LOW (ref 36.0–46.0)
Hemoglobin: 10.2 g/dL — ABNORMAL LOW (ref 12.0–15.0)
MCH: 27.6 pg (ref 26.0–34.0)
MCHC: 30.8 g/dL (ref 30.0–36.0)
MCV: 89.7 fL (ref 80.0–100.0)
Platelets: 287 10*3/uL (ref 150–400)
RBC: 3.69 MIL/uL — ABNORMAL LOW (ref 3.87–5.11)
RDW: 14.6 % (ref 11.5–15.5)
WBC: 10.9 10*3/uL — ABNORMAL HIGH (ref 4.0–10.5)
nRBC: 0 % (ref 0.0–0.2)

## 2019-02-13 LAB — URINE CULTURE

## 2019-02-13 LAB — MAGNESIUM: Magnesium: 1.9 mg/dL (ref 1.7–2.4)

## 2019-02-13 MED ORDER — HYDROCODONE-ACETAMINOPHEN 5-325 MG PO TABS
1.0000 | ORAL_TABLET | Freq: Four times a day (QID) | ORAL | Status: DC | PRN
  Administered 2019-02-13: 1 via ORAL
  Administered 2019-02-13: 2 via ORAL
  Filled 2019-02-13: qty 1
  Filled 2019-02-13: qty 2

## 2019-02-13 MED ORDER — HYDROCHLOROTHIAZIDE 25 MG PO TABS
25.0000 mg | ORAL_TABLET | Freq: Every day | ORAL | Status: DC
  Administered 2019-02-13 – 2019-02-14 (×2): 25 mg via ORAL
  Filled 2019-02-13 (×2): qty 1

## 2019-02-13 MED ORDER — SACCHAROMYCES BOULARDII 250 MG PO CAPS
250.0000 mg | ORAL_CAPSULE | Freq: Two times a day (BID) | ORAL | Status: DC
  Administered 2019-02-13 – 2019-02-14 (×3): 250 mg via ORAL
  Filled 2019-02-13 (×3): qty 1

## 2019-02-13 NOTE — Progress Notes (Signed)
Instructed pt on empty and cleaning around JP and flush with 5cc NS. Pt demonstrated care of JP site and flushed catheter with 5cc NS. Clean dressing applied by pt. Tol procedure well. SRP, RN

## 2019-02-13 NOTE — Progress Notes (Signed)
NUTRITION NOTE  38 year old female with a history of type 2 DM, PCOS, and morbid obesity. She presented to [facility] d/t LLQ abdominal pain which was worsening over the past couple of weeks. CT abdomen/pelvis showed L-sided diverticulitis with associated phlegmon and possible involvement of ovary. She was transferred to [facility] and General Surgery and Gynecology were consulted. She underwent CT-guided drain placement on 9/13 by IR.   Consult for low fiber diet education. Diet advanced from NPO to CLD on 9/13 at 1439, to [place] on 9/14 at 1125, and to Soft on 9/15 at 0828. Per flow sheet, she consumed 0% of breakfast on 9/14 and 100% of breakfast on 9/15. No other intakes documented.   Per notes patient will likely d/c home tomorrow as long as she remains stable.   Will place handouts for low fiber diet in Discharge Instructions.     Jarome Matin, MS, RD, LDN, [facility] Inpatient Clinical Dietitian Pager # [phone] After hours/weekend pager # [phone]

## 2019-02-13 NOTE — Progress Notes (Signed)
Referring Physician(s): Dr. Craig GuessSamtani/Dr. Maisie Fushomas  Supervising Physician: Ruel FavorsShick, Trevor  [facility]  [facility]  Chief Complaint: Follow up LLQ abdominal abscess drain placed 02/09/19 by Dr. Miles CostainShick.  Subjective:  Patient sitting up in bed eating lunch, reports pain is well controlled except when she has a bowel movement. Denies soreness at drain site today, except when defecating. States when she strains to have a bowel movement she feels a tearing sensation in her lower abdomen which resolves when she stops straining. Denies any other complaints. She is hopeful that she may go home soon.   Allergies: Patient has no known allergies.  Medications: Prior to Admission medications   Medication Sig Start Date End Date Taking? Authorizing Provider  atorvastatin (LIPITOR) 80 MG tablet Take 80 mg by mouth every evening. 12/23/18  Yes [provider]  glipiZIDE (GLUCOTROL) 5 MG tablet Take 5 mg by mouth 2 (two) times daily. 01/21/19  Yes [provider]  hydrochlorothiazide (HYDRODIURIL) 25 MG tablet Take 25 mg by mouth daily. 01/24/19  Yes [provider]  HYDROcodone-acetaminophen (NORCO/VICODIN) 5-325 MG tablet Take 1 tablet by mouth every 4 (four) hours as needed. Patient taking differently: Take 1 tablet by mouth every 4 (four) hours as needed for moderate pain.  02/02/19  Yes Ward, Baxter HireKristen N, DO  losartan (COZAAR) 50 MG tablet Take 50 mg by mouth daily. 01/24/19  Yes [provider]  metFORMIN (GLUCOPHAGE-XR) 500 MG 24 hr tablet Take 1,000 mg by mouth 2 (two) times daily. 12/23/18  Yes [provider]  ondansetron (ZOFRAN ODT) 4 MG disintegrating tablet Take 1 tablet (4 mg total) by mouth every 6 (six) hours as needed. Patient taking differently: Take 4 mg by mouth every 6 (six) hours as needed for nausea.  02/02/19  Yes Ward, Kristen N, DO  glucose blood (ONETOUCH VERIO) test strip 1 each by Other route 4 (four) times daily -  before meals and at  bedtime. And lancets 4/day 01/13/15   Romero BellingEllison, Sean, MD     Vital Signs: BP 101/73 (BP Location: Left Arm)    Pulse 68    Temp 98.3 F (36.8 C) (Oral)    Resp 18    Ht 5\' 8"  (1.727 m)    Wt 289 lb 3.2 oz (131.2 kg)    LMP 01/21/2019    SpO2 99%    Breastfeeding No Comment: negative urine pregnancy test 02-09-2019   BMI 43.97 kg/m   Physical Exam Vitals signs and nursing note reviewed.  Constitutional:      General: She is not in acute distress.    Appearance: She is obese.     Comments: Very pleasant, good historian.  HENT:     Head: Normocephalic.  Cardiovascular:     Rate and Rhythm: Normal rate.  Pulmonary:     Effort: Pulmonary effort is normal.  Abdominal:     General: Bowel sounds are normal. There is no distension.     Palpations: Abdomen is soft.     Tenderness: There is no abdominal tenderness.     Comments: (+) LLQ drain to suction with ~10 cc light tan, cloudy output. No issues with flushing per RN. Insertion site clean, dry, dressed appropriately. Stat lock and suture in tact.   Skin:    General: Skin is warm and dry.  Neurological:     Mental Status: She is alert. Mental status is at baseline.  Psychiatric:        Mood and Affect: Mood  normal.        Behavior: Behavior normal.        Thought Content: Thought content normal.        Judgment: Judgment normal.     Imaging: Ct Abdomen Pelvis W Contrast  Result Date: 02/12/2019 CLINICAL DATA:  Left lower quadrant abdominal pain around abscess drainage. EXAM: CT ABDOMEN AND PELVIS WITH CONTRAST TECHNIQUE: Multidetector CT imaging of the abdomen and pelvis was performed using the standard protocol following bolus administration of intravenous contrast. CONTRAST:  152mL OMNIPAQUE IOHEXOL 300 MG/ML  SOLN COMPARISON:  CT scan of February 09, 2019. FINDINGS: Lower chest: No acute abnormality. Hepatobiliary: No focal liver abnormality is seen. No gallstones, gallbladder wall thickening, or biliary dilatation. Pancreas:  Unremarkable. No pancreatic ductal dilatation or surrounding inflammatory changes. Spleen: Normal in size without focal abnormality. Adrenals/Urinary Tract: Adrenal glands are unremarkable. Kidneys are normal, without renal calculi, focal lesion, or hydronephrosis. Bladder is unremarkable. Stomach/Bowel: The stomach appears normal. The appendix appears normal. There is no evidence of bowel obstruction. Inflammatory changes are noted around the proximal sigmoid colon consistent with diverticulitis. Pigtail drainage catheter is seen in the para diverticular abscess. The abscess is significantly decreased in size, with its superior portion measuring only 4.5 x 4.2 cm currently. Small amount of residual fluid remains, with the interval development of air within the cavity. The inferior portion is slightly smaller. Vascular/Lymphatic: No significant vascular findings are present. No enlarged abdominal or pelvic lymph nodes. Reproductive: The uterus is unremarkable. Right adnexal region is unremarkable. Left adnexal region appears to be involved in the inflammation described above. Other: No abdominal wall hernia or abnormality. No abdominopelvic ascites. Musculoskeletal: No acute or significant osseous findings. IMPRESSION: Interval placement of drainage catheter into abscess adjacent to sigmoid diverticulitis. The abscess is significantly decreased in size, particularly its superior portion, with only a small amount of residual fluid remaining in the superior portion, with slightly decreased amount of fluid seen in the inferior portion. Electronically Signed   By: Marijo Conception M.D.   On: 02/12/2019 15:35    Labs:  CBC: Recent Labs    02/10/19 0524 02/11/19 0445 02/12/19 0413 02/13/19 0416  WBC 12.7* 12.2* 9.0 10.9*  HGB 11.1* 10.8* 10.8* 10.2*  HCT 35.9* 35.2* 35.6* 33.1*  PLT 309 301 323 287    COAGS: No results for input(s): INR, APTT in the last 8760 hours.  BMP: Recent Labs     02/10/19 0524 02/11/19 0445 02/12/19 0413 02/13/19 0416  NA 137 137 137 138  K 3.2* 3.6 4.4 4.4  CL 98 102 107 111  CO2 28 24 22 22   GLUCOSE 125* 130* 147* 165*  BUN 7 7 8 7   CALCIUM 8.5* 8.8* 8.8* 8.8*  CREATININE 0.74 0.67 0.65 0.73  GFRNONAA >60 >60 >60 >60  GFRAA >60 >60 >60 >60    LIVER FUNCTION TESTS: Recent Labs    02/01/19 2054 02/09/19 0150 02/13/19 0416  BILITOT 0.5 0.4 0.2*  AST 15 10* 23  ALT 17 13 23   ALKPHOS 74 71 62  PROT 8.1 7.8 7.1  ALBUMIN 4.0 3.1* 2.8*    Assessment and Plan:  38 y/o F s/p LLQ abdominal abscess drain placement 02/09/19 by Dr. Annamaria Boots. Patient feeling better today, only has some pain with defecation. Tolerating PO intake well. Per RN planned for d/c likely tomorrow.  Per I/O 38 cc output in last 24H with ~10 cc in suction bulb on my exam today, no issues with flushing or suction  reported. Patient is afebrile, WBC slightly increased from yesterday (9.0 --> 10.9), hgb stable at 10.2. CT abd/pelvis yesterday showed significant decrease in the abscess size with a small amount of residual fluid remaining.   Patient will need to flush drain QD with 5 cc NS (will need rx for flushes upon d/c), return bulb to suction after flushing, dressing changes Q3days or PRN if soiled/wet and record output once daily. Appreciate RN providing education on flushing/drain care. Ms. Berte will see [place] in clinic in 7-10 days for follow up CT/possible drain injection. I have placed drain care instructions and follow up contact information in her discharge summary today.   Please call IR with any questions or concerns.   Electronically Signed: Villa Herb, PA-C 02/13/2019, 2:22 PM   I spent a total of 15 Minutes at the the patient's bedside AND on the patient's hospital floor or unit, greater than 50% of which was counseling/coordinating care for follow up LLQ abdominal abscess drain.

## 2019-02-13 NOTE — Progress Notes (Signed)
[facility] Progress Note     Subjective: CC-  Comfortable this morning. States that last night she had severe LLQ pain with a bowel movement. Hydrocodone helped. She currently has little to no abdominal pain. Denies n/v. Tolerating diet.  WBC 10.9, afebrile Drain with 38cc feculent drainage last 24 hours.  CT scan yesterday showed persist sigmoid diverticulitis but abscess is significantly decreased in size s/p drain placement.   Objective: Vital signs in last 24 hours: Temp:  [98.1 F (36.7 C)-98.3 F (36.8 C)] 98.3 F (36.8 C) (09/16 2108) Pulse Rate:  [66-68] 68 (09/16 2108) Resp:  [18] 18 (09/16 2108) BP: (98-101)/(59-73) 101/73 (09/16 2108) SpO2:  [99 %-100 %] 99 % (09/16 2108) Last BM Date: 02/10/19  Intake/Output from previous day: 09/16 0701 - 09/17 0700 In: 20 [I.V.:5] Out: 38 [Drains:38] Intake/Output this shift: No intake/output data recorded.  PE: Gen: Alert, NAD, pleasant HEENT: EOM's intact, pupils equal and round Pulm:rate andeffort normal XFG:HWEXH, soft,ND, nontender, +BS, no HSM, drain with feculent output BZJ:IRCVEL soft and nontender Psych: A&Ox3  Skin: no rashes noted, warm and dry    Lab Results:  Recent Labs    02/12/19 0413 02/13/19 0416  WBC 9.0 10.9*  HGB 10.8* 10.2*  HCT 35.6* 33.1*  PLT 323 287   BMET Recent Labs    02/12/19 0413 02/13/19 0416  NA 137 138  K 4.4 4.4  CL 107 111  CO2 22 22  GLUCOSE 147* 165*  BUN 8 7  CREATININE 0.65 0.73  CALCIUM 8.8* 8.8*   PT/INR No results for input(s): LABPROT, INR in the last 72 hours. CMP     Component Value Date/Time   NA 138 02/13/2019 0416   K 4.4 02/13/2019 0416   CL 111 02/13/2019 0416   CO2 22 02/13/2019 0416   GLUCOSE 165 (H) 02/13/2019 0416   BUN 7 02/13/2019 0416   CREATININE 0.73 02/13/2019 0416   CALCIUM 8.8 (L) 02/13/2019 0416   PROT 7.1 02/13/2019 0416   ALBUMIN 2.8 (L) 02/13/2019 0416   AST 23 02/13/2019 0416   ALT 23 02/13/2019 0416    ALKPHOS 62 02/13/2019 0416   BILITOT 0.2 (L) 02/13/2019 0416   GFRNONAA >60 02/13/2019 0416   GFRAA >60 02/13/2019 0416   Lipase     Component Value Date/Time   LIPASE 25 02/01/2019 2054       Studies/Results: Ct Abdomen Pelvis W Contrast  Result Date: 02/12/2019 CLINICAL DATA:  Left lower quadrant abdominal pain around abscess drainage. EXAM: CT ABDOMEN AND PELVIS WITH CONTRAST TECHNIQUE: Multidetector CT imaging of the abdomen and pelvis was performed using the standard protocol following bolus administration of intravenous contrast. CONTRAST:  119mL OMNIPAQUE IOHEXOL 300 MG/ML  SOLN COMPARISON:  CT scan of February 09, 2019. FINDINGS: Lower chest: No acute abnormality. Hepatobiliary: No focal liver abnormality is seen. No gallstones, gallbladder wall thickening, or biliary dilatation. Pancreas: Unremarkable. No pancreatic ductal dilatation or surrounding inflammatory changes. Spleen: Normal in size without focal abnormality. Adrenals/Urinary Tract: Adrenal glands are unremarkable. Kidneys are normal, without renal calculi, focal lesion, or hydronephrosis. Bladder is unremarkable. Stomach/Bowel: The stomach appears normal. The appendix appears normal. There is no evidence of bowel obstruction. Inflammatory changes are noted around the proximal sigmoid colon consistent with diverticulitis. Pigtail drainage catheter is seen in the para diverticular abscess. The abscess is significantly decreased in size, with its superior portion measuring only 4.5 x 4.2 cm currently. Small amount of residual fluid remains, with the interval development of  air within the cavity. The inferior portion is slightly smaller. Vascular/Lymphatic: No significant vascular findings are present. No enlarged abdominal or pelvic lymph nodes. Reproductive: The uterus is unremarkable. Right adnexal region is unremarkable. Left adnexal region appears to be involved in the inflammation described above. Other: No abdominal wall  hernia or abnormality. No abdominopelvic ascites. Musculoskeletal: No acute or significant osseous findings. IMPRESSION: Interval placement of drainage catheter into abscess adjacent to sigmoid diverticulitis. The abscess is significantly decreased in size, particularly its superior portion, with only a small amount of residual fluid remaining in the superior portion, with slightly decreased amount of fluid seen in the inferior portion. Electronically Signed   By: Lupita RaiderJames  Green Jr M.D.   On: 02/12/2019 15:35    Anti-infectives: Anti-infectives (From admission, onward)   Start     Dose/Rate Route Frequency Ordered Stop   02/09/19 1200  piperacillin-tazobactam (ZOSYN) IVPB 3.375 g     3.375 g 12.5 mL/hr over 240 Minutes Intravenous Every 8 hours 02/09/19 0600     02/09/19 0400  piperacillin-tazobactam (ZOSYN) IVPB 3.375 g     3.375 g 12.5 mL/hr over 240 Minutes Intravenous  Once 02/09/19 0355 02/09/19 0805       Assessment/Plan DM HTN Obesity  Diverticular abscess involving Leftovary - S/p perc drainage 9/13 >> culture showingE COLI pansensitive  - CT scan 9/16 showed persist sigmoid diverticulitis but abscess is significantly decreased in size s/p drain placement.  - WBC 10.9, afebrile  ID -zosyn 9/13>> FEN -d/c IVF,soft diet VTE -SCDs, lovenox Foley -none Follow up -IR, Dr. Maisie Fushomas  Plan-Overall doing well with slow improvement. Continue drain and antibiotics. Will ask dietician to see for diet education. If pain is well controlled, she is tolerating a diet, WBC WNL, and afebrile she will likely be ready for discharge tomorrow. She will need to follow up with IR and Dr. Maisie Fushomas.   LOS: 4 days    Franne FortsBrooke A Meuth , [facility] 02/13/2019, 8:43 AM Pager: [phone] Mon-Thurs 7:00 am-4:30 pm Fri 7:00 am -11:30 AM Sat-Sun 7:00 am-11:30 am

## 2019-02-13 NOTE — Progress Notes (Signed)
Pt has bright bleeding from the rectum, external hemorrhoids, however, do not appear inflamed. MD updated of the finding. SRP, RN

## 2019-02-13 NOTE — Discharge Instructions (Addendum)
Low-Fiber Eating Plan Fiber is found in fruits, vegetables, whole grains, and beans. Eating a diet low in fiber helps to reduce how often you have bowel movements and how much you produce during a bowel movement. A low-fiber eating plan may help your digestive system heal if:  You have certain conditions, such as Crohn's disease or diverticulitis.  You recently had radiation therapy on your pelvis or bowel.  You recently had intestinal surgery.  You have a new surgical opening in your abdomen (colostomy or ileostomy).  Your intestine is narrowed (stricture). Your health care provider will determine how long you need to stay on this diet. Your health care provider may recommend that you work with a diet and nutrition specialist (dietitian). What are tips for following this plan? General guidelines  Follow recommendations from your dietitian about how much fiber you should have each day.  Most people on this eating plan should try to eat less than 10 grams (g) of fiber each day. Your daily fiber goal is _________________ g.  Take vitamin and mineral supplements as told by your health care provider or dietitian. Chewable or liquid forms are best when on this eating plan. Reading food labels  Check food labels for the amount of dietary fiber.  Choose foods that have less than 2 grams of fiber in one serving. Cooking  Use white flour and other allowed grains for baking and cooking.  Cook meat using methods that keep it tender, such as braising or poaching.  Cook eggs until the yolk is completely solid.  Cook with healthy oils, such as olive oil or canola oil. Meal planning   Eat 5-6 small meals throughout the day instead of 3 large meals.  If you are lactose intolerant: ? Choose low-lactose dairy foods. ? Do not eat dairy foods, if told by your dietitian.  Limit fat and oils to less than 8 teaspoons a day.  Eat small portions of desserts. What foods are allowed? The items  listed below may not be a complete list. Talk with your dietitian about what dietary choices are best for you. Grains All bread and crackers made with white flour. Waffles, pancakes, and [place] toast. Bagels. Pretzels. Melba toast, zwieback, and matzoh. Cooked and dried cereals that do not contain whole grains, added fiber, seeds, or dried fruit. CornmealDomenick Gong. Hot and cold cereals made with refined corn, wheat, rice, or oats. Plain pasta and noodles. White rice. Vegetables Well-cooked or canned vegetables without skin, seeds, or stems. Cooked potatoes without skins. Vegetable juice. Fruits Soft-cooked or canned fruits without skin and seeds. Peeled ripe banana. Applesauce. Fruit juice without pulp. Meats and other protein foods Ground meat. Tender cuts of meat or poultry. Eggs. Fish, seafood, and shellfish. Smooth nut butters. Tofu. Dairy All milk products and drinks. Lactose-free milks, including rice, soy, and almond milks. Yogurt without fruit, nuts, chocolate, or granola mix-ins. Sour cream. Cottage cheese. Cheese. Beverages Decaf coffee. Fruit and vegetable juices or smoothies (in small amounts, with no pulp or skins, and with fruits from allowed list). Sports drinks. Herbal tea. Fats and oils Olive oil, canola oil, sunflower oil, flaxseed oil, and grapeseed oil. Mayonnaise. Cream cheese. Margarine. Butter. Sweets and desserts Plain cakes and cookies. Cream pies and pies made with allowed fruits. Pudding. Custard. Fruit gelatin. Sherbet. Popsicles. Ice cream without nuts. Plain hard candy. Honey. Jelly. Molasses. Syrups, including chocolate syrup. Chocolate. Marshmallows. Gumdrops. Seasoning and other foods Bouillon. Broth. Cream soups made from allowed foods. Strained soup. Casseroles made  with allowed foods. Ketchup. Mild mustard. Mild salad dressings. Plain gravies. Vinegar. Spices in moderation. Salt. Sugar. What foods are not allowed? The items listed below may not be a complete  list. Talk with your dietitian about what dietary choices are best for you. Grains Whole wheat and whole grain breads and crackers. Multigrain breads and crackers. Rye bread. Whole grain or multigrain cereals. Cereals with nuts, raisins, or coconut. Bran. Coarse wheat cereals. Granola. High-fiber cereals. Cornmeal or corn bread. Whole grain pasta. Wild or brown rice. Quinoa. Popcorn. Buckwheat. Wheat germ. Vegetables Potato skins. Raw or undercooked vegetables. All beans and bean sprouts. Cooked greens. Corn. Peas. Cabbage. Beets. Broccoli. Brussels sprouts. Cauliflower. Mushrooms. Onions. Peppers. Parsnips. Okra. Sauerkraut. Fruit Raw or dried fruit. Berries. Fruit juice with pulp. Prune juice. Meats and other protein foods Tough, fibrous meats with gristle. Fatty meat. Poultry with skin. Fried meat, Environmental education officerpoultry, or fish. Deli or lunch meats. Sausage, bacon, and hot dogs. Nuts and chunky nut butter. Dried peas, beans, and lentils. Dairy Yogurt with fruit, nuts, chocolate, or granola mix-ins. Beverages Caffeinated coffee and teas. Fats and oils Avocado. Coconut. Sweets and desserts Desserts, cookies, or candies that contain nuts or coconut. Dried fruit. Jams and preserves with seeds. Marmalade. Any dessert made with fruits or grains that are not allowed. Seasoning and other foods Corn tortilla chips. Soups made with vegetables or grains that are not allowed. Relish. Horseradish. Rosita FirePickles. Olives. Summary  Most people on a low-fiber eating plan should eat less than 10 grams of fiber a day. Follow recommendations from your dietitian about how much fiber you should have each day.  Always check food labels to see the dietary fiber content of packaged foods. In general, a low-fiber food will have fewer than 2 grams of fiber per serving.  In general, try to avoid whole grains, raw fruits and vegetables, dried fruit, tough cuts of meat, nuts, and seeds.  Take a vitamin and mineral supplement as told  by your health care provider or dietitian. This information is not intended to replace advice given to you by your health care provider. Make sure you discuss any questions you have with your health care provider. Document Released: 11/04/2001 Document Revised: 09/06/2018 Document Reviewed: 07/18/2016 Elsevier Patient Education  2020 [employer]    Diverticulitis  Diverticulitis is when small pockets in your large intestine (colon) get infected or swollen. This causes stomach pain and watery poop (diarrhea). These pouches are called diverticula. They form in people who have a condition called diverticulosis. Follow these instructions at home: Medicines  Take over-the-counter and prescription medicines only as told by your doctor. These include: ? Antibiotics. ? Pain medicines. ? Fiber pills. ? Probiotics. ? Stool softeners.  Do not drive or use heavy machinery while taking prescription pain medicine.  If you were prescribed an antibiotic, take it as told. Do not stop taking it even if you feel better. General instructions   Follow a diet as told by your doctor.  When you feel better, your doctor may tell you to change your diet. You may need to eat a lot of fiber. Fiber makes it easier to poop (have bowel movements). Healthy foods with fiber include: ? Berries. ? Beans. ? Lentils. ? Green vegetables.  Exercise 3 or more times a week. Aim for 30 minutes each time. Exercise enough to sweat and make your heart beat faster.  Keep all follow-up visits as told. This is important. You may need to have an exam of the large  intestine. This is called a colonoscopy. Contact a doctor if:  Your pain does not get better.  You have a hard time eating or drinking.  You are not pooping like normal. Get help right away if:  Your pain gets worse.  Your problems do not get better.  Your problems get worse very fast.  You have a fever.  You throw up (vomit) more than one  time.  You have poop that is: ? Bloody. ? Black. ? Tarry. Summary  Diverticulitis is when small pockets in your large intestine (colon) get infected or swollen.  Take medicines only as told by your doctor.  Follow a diet as told by your doctor. This information is not intended to replace advice given to you by your health care provider. Make sure you discuss any questions you have with your health care provider. Document Released: 11/01/2007 Document Revised: 04/27/2017 Document Reviewed: 06/01/2016 Elsevier Patient Education  [place].   Fiber-Restricted (13 grams) Nutrition Therapy  A fiber-restricted diet contains less than 13 grams of fiber daily.  Your registered dietitian nutritionist (RDN) or health care provider may suggest you eat less fiber if you have Crohns disease or ulcerative colitis and are in a flare or are taking prednisone or budesonide medications.  You might also be prescribed this diet if you have irritable bowel syndrome with diarrhea or if you are recovering from gastrointestinal surgery.  As your symptoms and condition get better, your RDN or health care provider will help you add more fiber to your diet.  Its also important to eat enough protein foods while you are on a fiberrestricted diet. A fiber-restricted diet includes limited amounts of foods that your body cannot digest.  This diet should help you slow the movement of food in your intestines and lower the amount and bulk of your stool. It may also help with your diarrhea, stomach pain, gas and bloating. A fiber-restricted diet may be low in some nutrients, because a variety of foods are limited to reduce symptoms. Take a chewable multivitamin with minerals to make sure you are getting enough nutrients.  You might need calcium with vitamin D supplements too if youre not able to eat enough calcium and vitamin D in your diet.  Tips Eat about 5 to 6 small meals every 3 or 4 hours daily. Eat a  protein food or dairy product at every meal or snack if your body can tolerate it.  See the Foods Recommended table for ideas. Avoid acidic, spicy, fried, greasy and high-fat foods. You may need to limit foods/beverages that contain: Sugar Lactose. Try lactose-free products to reduce symptoms of gas or bloating. Fructose High-fructose corn syrup Sugar-free sweeteners such as aspartame, sucralose, or sorbitol Caffeine  Do not eat whole grains, seeds, fruit and vegetable peels or skins, whole nuts, raw vegetables, most raw fruits and the connective tissues of meats. Take calcium with vitamin D supplements at a different time than the multivitamin with minerals. All vitamin and mineral supplements should be taken with food. Choose foods that have been safely handled and prepared to lower your risk of foodborne illness.  These suggestions help most people with symptoms.  However, if your symptoms get worse after eating specific foods on this list, you should stop eating them until you recover.  Foods Recommended These foods are low in fiber and may help your symptoms.  However, if your symptoms get worse after eating specific foods on this list, you should stop eating them until  you recover  Food Group Foods Recommended Grains Grain foods with less than 2 grams fiber per serving White flour Bread, bagels, rolls, crackers, and pasta made from white or refined flour Cold or hot cereals made from white or refined flour such as corn flakes, puffed rice, cream of wheat, cream of rice, or refined grits Protein Foods Tender, well-cooked, lean meats made without added fat: beef, fish, lamb, pork, or poultry Lean deli meats (heated to steaming) Well-cooked eggs Tofu Smooth nut butters: almond, peanut, or sunflower Dairy If you have lactose intolerance, drinking milk products from cows or goats may make diarrhea worse. Foods marked with an asterisk (*) have lactose. Buttermilk* Fat-free, 1%,  and 2% milk* Lactose-free milk Powdered milk and evaporated milk* Fortified non-dairy milks: almond, cashew, coconut, or rice (be aware that these options are not good sources of protein so you will need to eat an additional protein food) Fortified pea milk and soymilk (may cause gas and bloating in some people) Yogurt* with live active cultures without fruit, granola, or nuts Lactose-free yogurt Kefir (many are 99% lactose-free) Cheese*: cheddar, Swiss, Parmesan (low-fat, block, hard and aged cheese are usually lower in lactose) Low-fat ice cream* Lactose-free ice cream Cottage cheese* Lactose-free cottage cheese Vegetables See the Foods Not Recommended table for vegetables to avoid Well-cooked vegetables without seeds or skins Potatoes without skin: white, red and yellow Small amounts of sweet potatoes without skin may be added as fiber is increased in the diet Strained vegetable juice Fruit Fruit juice, except for prune juice Ripe bananas Melons: cantaloupe, honeydew or watermelon Peeled apple; a baked apple will have less fiber than a fresh apple Canned soft fruits in juice, avoid pineapple Oils Limit fats and oils to less than 8 teaspoons per day. Choose oils (olive, canola) more often than solid fats Beverages Healthy people need 8 to 10 cups of fluid each day which mainly is recommended to be plain water; coffee, tea and water with added flavor packets are not included in this recommendation as these often increase symptoms. You may need to drink more to replace fluids lost from diarrhea. Decaffeinated coffee Caffeine-free teas Rehydration beverages  Foods Not Recommended These foods are higher in fat and fiber and may make your symptoms worse.  Food Group Foods Not Recommended Grains Whole wheat or whole grain breads, rolls, crackers, or pasta Brown or wild rice Barley, oats, and other whole grains such as quinoa Cereals made from whole grain or bran such as  shredded wheat or bran flakes Breads or cereals made with seeds or nuts Popcorn Protein Foods Fried meat, Environmental education officer, or fish [employer], such as bologna or salami Sausage and bacon Hot dogs Fatty meats Dried beans and peas; hummus Nuts and seeds (coconut, chia seeds, flaxseeds) Crunchy nut butters: almond or peanut Dairy Whole milk* Half-and-half* Cream* Sour cream* Ice cream* Yogurt* with fruit, granola, or nuts Vegetables All raw vegetables Fried vegetables Cooked beets; broccoli; brussels sprouts; cabbage; cauliflower; collard, mustard, and turnip greens; corn; dried beans; kale; lima beans; mushrooms; okra; onions; potato skins; spinach Fruit All fresh fruits, except fruits from the Foods Recommended table All dried fruits, including prunes and raisins Fruit juice with pulp Canned fruit in heavy syrup Any fruits sweetened with sorbitol Prune juice Oils  Butter Beverages Beverages containing caffeine: regular coffee, regular tea, soda, and energy drinks Limit beverages containing high-fructose corn syrup to 12 ounces per day Avoid beverages sweetened with sorbitol or other sugar substitutes Alcoholic beverages Others Sugar alcohols such  as erythritol, mannitol, sorbitol, and xylitol Sugar substitutes such as aspartame, and sucralose Honey Foods marked with an asterisk (*) have lactose.  Fiber-Restricted (13 grams) Sample 1-Day Menu Breakfast 1 cup puffed rice cereal (0.5 gram fiber) 1 cup lactose-free milk 1 ripe banana (2.5 grams fiber) Morning Snack 1 cup decaffeinated tea 6 ounces lactose-free yogurt without fruit, granola, or nuts 2 graham cracker squares (0.5 gram fiber) Lunch 2 ounces [place] (heated until steaming) 1 teaspoon yellow mustard, if tolerated 1 cup chicken and rice soup (2 grams fiber) 2 slices white bread (1.5 grams fiber) 1 teaspoon mayonnaise Afternoon Snack 6 saltine crackers (0.5 gram fiber) 1 ounces low-fat cheddar cheese 1  cup rehydration beverage Evening Meal 4 ounces baked fish 1 teaspoon olive oil Squeeze of lemon juice  cup mashed potatoes without skin (1 gram fiber)  cup green beans, cooked well (2 grams fiber) 1 cups water Evening Snack 6 saltine crackers (0.5 grams fiber) 1 tablespoon smooth peanut butter (1 gram fiber)  Fiber-Restricted (13 grams) Vegetarian (Lacto-Ovo) Sample 1-Day Menu Breakfast 1 cup puffed rice cereal (0.5 gram fiber)  small ripe banana (1.5 grams fiber) 1 cup 2% milk Morning Snack 8 ounces smooth yogurt (without nuts, granola, or fruit) 1 cup decaffeinated tea Lunch  cup tomato bisque (1 gram fiber)  cup cooked carrots (1 gram fiber)  sandwich made with: 1 slice white bread (1 gram fiber)  cup baked tofu (1 gram fiber) 1 tablespoon mayonnaise  cup applesauce (1 gram fiber) Afternoon Snack 4 saltine crackers 1 cup 2% milk Evening Meal  cup meatless chicken (3 grams fiber)  cup mashed potatoes without skin (1 gram fiber) 2 teaspoons olive oil  cup green beans, cooked well (2 grams fiber) 2 teaspoons olive oil 1 cup water Evening Snack 1 hard-boiled egg 4 saltine crackers   Lower-Fiber Foods List Food Amount Beef, poultry, fish 3 oz Bread, white 1 slice Cheese (all types) 1 oz Cottage cheese  cup Cream of wheat, instant  cup Egg 1 whole Fruit juice  cup (4 oz) Green beans, canned  cup Ice cream  cup Lactose-free milk 1 cup (8 oz) Lettuce (all types) 1 cup Mashed potatoes  cup Milk (all types) 1 cup (8 oz) Nut butters, smooth (peanut, soy, almond, or sunflower) 2 Tbsp Pasta, white  cup Peaches, canned  cup Pears, canned  cup Pudding or tapioca  cup Rice, white  cup Soy milk, rice milk, or almond milk 1 cup (8 oz) Spinach 1 cup raw  cup cooked Tofu  cup Tuna, canned 3 oz Yogurt 6 oz Soy yogurt 6 oz    High-Fiber Foods List *avoid these food until instructed to begin increasing fiber in your diet* How Much Do You  Need? The daily recommendations for most healthy adults: Men 50 years and younger: 38 grams (g) fiber per day Men 51 years and older: 30 g fiber per day Women 50 years and younger: 25 g fiber per day Women 51 years and older: 21 g fiber per day Foods Recommended Food Amount Total Fiber (g) Bran cereal 1/3 cup 8.6 Cooked kidney beans  cup 7.9 Cooked lentils  cup 7.8 Cooked black beans  cup 7.6 Canned chickpeas  cup 5.3 Baked beans  cup 5.2 Pear 1 5.1 Soybeans  cup 5.1 Quinoa  cup 5 Baked sweet potato, with skin 1 medium 4.8 Baked potato, with skin 1 medium 4.4 Cooked frozen green peas  cup 4.4 Bulgur  cup 4.1 Cooked frozen mixed vegetables  cup  4 Raspberries  cup 4 Blackberries  cup 3.8 Almonds 1 oz 3.5 Cooked frozen spinach  cup 3.5 Vegetable or soy patty 1 each 3.4 Apple 1 medium 3.3 Dried dates 5 pieces 3.3

## 2019-02-13 NOTE — Progress Notes (Signed)
PROGRESS NOTE  Robyn [name] ZOX:[id number] DOB: 17-Jun-1980 DOA: 02/09/2019 PCP: Robyn [name]   LOS: 4 days   Brief narrative: Patient is a 38 year old female with a history of diabetes mellitus 2, PCOS, morbid obesity [facility] with left lower quadrant abdominal pain worsening over the last few weeks.  CT abdomen pelvis showed left-sided diverticulitis with associated phlegmon and possible involvement of ovary. Patient was [facility] for [facility], general surgery, gynecology was consulted.  Patient was placed on IV fluids and IV Zosyn.  Patient underwent CT-guided drain placement on 9/13 by interventional radiology.  Assessment/Plan:  Principal Problem:   Colonic diverticular abscess Active Problems:   Obesity   PCOS (polycystic ovarian syndrome)   Hypertension - on Labetalol 200 mg po bid   Diabetes mellitus type 2, uncontrolled, without complications -- dx'd in 2011   S/P repeat low transverse C-section   Diverticulitis  Colonic diverticular abscess involving the left ovary -CT abdomen showed left-sided diverticulitis with associated phlegmon and possible involvement of left ovary.  General surgery and gynecology consulted. Patient underwent CT-guided drain placement on 9/13, 25 cc pus aspirated, cultures showing E. coli, pansensitive.  Afebrile at this time. on IV Zosyn.  Will change to oral antibiotics on discharge.  Surgery on board closely following.  Likely disposition home by tomorrow if the patient remains stable overnight.  CT scan of the abdomen done on 02/12/2019 showed decreased abscess.  Patient complained of severe sharp abdominal pain during defecation yesterday.  Counseling was done regarding the possibility of pain during defecation by me and surgical staff at bedside.    PCOS (polycystic ovarian syndrome) Gynecology was consulted.  Essential hypertension  -BP stable, continue Norvasc, losartan.  Resume HCTZ due  to peripheral edema.  Blood pressure seems to be stable.  Will closely monitor.    Diabetes mellitus type 2 with hyperglycemia: Without complications Continue to hold oral hypoglycemics.  Continue sliding scale insulin Accu-Cheks diabetic diet.  Reasonably controlled glycemic status.  Hyperlipidemia Continue Lipitor  Morbid obesity -BMI 43, -Counseled on healthy lifestyle, diet and weight control   VTE Prophylaxis: Lovenox  Code Status: Full code  Family Communication: None.  Spoke with general surgery at bedside  Disposition Plan: Home likely by tomorrow if the patient remains stable.  Follow surgical recommendation.  Check labs in a.m.  Consultants:  General surgery,   interventional radiology,   gynecology  Procedures:  CT-guided drain placement of the left lower quadrant on 02/09/2019  Antibiotics: Anti-infectives (From admission, onward)   Start     Dose/Rate Route Frequency Ordered Stop   02/09/19 1200  piperacillin-tazobactam (ZOSYN) IVPB 3.375 g     3.375 g 12.5 mL/hr over 240 Minutes Intravenous Every 8 hours 02/09/19 0600     02/09/19 0400  piperacillin-tazobactam (ZOSYN) IVPB 3.375 g     3.375 g 12.5 mL/hr over 240 Minutes Intravenous  Once 02/09/19 0355 02/09/19 0805     Subjective: Patient complains of severe sharp abdominal pain yesterday during defecation.  Denies any fever, chills, nausea, vomiting.  Has been tolerating diet.  Objective: Vitals:   02/12/19 1553 02/12/19 2108  BP: (!) 98/59 101/73  Pulse: 66 68  Resp:  18  Temp: 98.1 F (36.7 C) 98.3 F (36.8 C)  SpO2: 100% 99%    Intake/Output Summary (Last 24 hours) at 02/13/2019 1108 Last data filed at 02/13/2019 0413 Gross per 24 hour  Intake 15 ml  Output 35 ml  Net -20  ml   Filed Weights   02/09/19 1458 02/10/19 0453 02/11/19 1600  Weight: 131.2 kg 97 kg 131.2 kg   Body mass index is 43.97 kg/m.   Physical Exam: GENERAL: Patient is alert awake and oriented. Not in obvious  distress.  Morbidly obese. HENT: No scleral pallor or icterus. Pupils equally reactive to light. Oral mucosa is moist NECK: is supple, no palpable thyroid enlargement. CHEST: Clear to auscultation. No crackles or wheezes. Non tender on palpation. Diminished breath sounds bilaterally. CVS: S1 and S2 heard, no murmur. Regular rate and rhythm. No pericardial rub. ABDOMEN: Soft, obese abdomen, left lower quadrant drain in place with feculent drainage with local tenderness, bowel sounds are present. No palpable hepato-splenomegaly. EXTREMITIES: No edema. CNS: Cranial nerves are intact. No focal motor or sensory deficits. SKIN: warm and dry without rashes.  Data Review: I have personally reviewed the following laboratory data and studies,  CBC: Recent Labs  Lab 02/09/19 0150 02/10/19 0524 02/11/19 0445 02/12/19 0413 02/13/19 0416  WBC 18.0* 12.7* 12.2* 9.0 10.9*  NEUTROABS 13.6*  --   --   --   --   HGB 11.3* 11.1* 10.8* 10.8* 10.2*  HCT 35.2* 35.9* 35.2* 35.6* 33.1*  MCV 85.4 88.2 88.0 89.4 89.7  PLT 310 309 301 323 403   Basic Metabolic Panel: Recent Labs  Lab 02/09/19 0150 02/10/19 0524 02/11/19 0445 02/12/19 0413 02/13/19 0416  NA 132* 137 137 137 138  K 3.0* 3.2* 3.6 4.4 4.4  CL 91* 98 102 107 111  CO2 28 28 24 22 22   GLUCOSE 195* 125* 130* 147* 165*  BUN 7 7 7 8 7   CREATININE 0.65 0.74 0.67 0.65 0.73  CALCIUM 9.1 8.5* 8.8* 8.8* 8.8*  MG  --   --   --   --  1.9   Liver Function Tests: Recent Labs  Lab 02/09/19 0150 02/13/19 0416  AST 10* 23  ALT 13 23  ALKPHOS 71 62  BILITOT 0.4 0.2*  PROT 7.8 7.1  ALBUMIN 3.1* 2.8*   No results for input(s): LIPASE, AMYLASE in the last 168 hours. No results for input(s): AMMONIA in the last 168 hours. Cardiac Enzymes: No results for input(s): CKTOTAL, CKMB, CKMBINDEX, TROPONINI in the last 168 hours. BNP (last 3 results) No results for input(s): BNP in the last 8760 hours.  ProBNP (last 3 results) No results for  input(s): PROBNP in the last 8760 hours.  CBG: Recent Labs  Lab 02/12/19 0826 02/12/19 1208 02/12/19 1711 02/12/19 2104 02/13/19 0856  GLUCAP 154* 179* 119* 154* 158*   Recent Results (from the past 240 hour(s))  Urine culture     Status: None   Collection Time: 02/09/19  1:01 AM   Specimen: Urine, Clean Catch  Result Value Ref Range Status   Specimen Description   Final    URINE, CLEAN CATCH Performed at [facility], [address] [place], [place] [address]    Special Requests   Final    NONE Performed at [facility], [address] [place], [place] [address]    Culture   Final    Multiple bacterial morphotypes present, none predominant. Suggest appropriate recollection if clinically indicated.   Report Status 02/13/2019 FINAL  Final  SARS Coronavirus 2 [facility] order, Performed in [facility] hospital lab) Nasopharyngeal Nasopharyngeal Swab     Status: None   Collection Time: 02/09/19  4:04 AM   Specimen: Nasopharyngeal Swab  Result Value Ref Range  Status   SARS Coronavirus 2 NEGATIVE NEGATIVE Final    Comment: (NOTE) If result is NEGATIVE SARS-CoV-2 target nucleic acids are NOT DETECTED. The SARS-CoV-2 RNA is generally detectable in upper and lower  respiratory specimens during the acute phase of infection. The lowest  concentration of SARS-CoV-2 viral copies this assay can detect is 250  copies / mL. A negative result does not preclude SARS-CoV-2 infection  and should not be used as the sole basis for treatment or other  patient management decisions.  A negative result may occur with  improper specimen collection / handling, submission of specimen other  than nasopharyngeal swab, presence of viral mutation(s) within the  areas targeted by this assay, and inadequate number of viral copies  (<250 copies / mL). A negative result must be combined with clinical  observations, patient history, and epidemiological information. If result is  POSITIVE SARS-CoV-2 target nucleic acids are DETECTED. The SARS-CoV-2 RNA is generally detectable in upper and lower  respiratory specimens dur ing the acute phase of infection.  Positive  results are indicative of active infection with SARS-CoV-2.  Clinical  correlation with patient history and other diagnostic information is  necessary to determine patient infection status.  Positive results do  not rule out bacterial infection or co-infection with other viruses. If result is PRESUMPTIVE POSTIVE SARS-CoV-2 nucleic acids MAY BE PRESENT.   A presumptive positive result was obtained on the submitted specimen  and confirmed on repeat testing.  While 2019 novel coronavirus  (SARS-CoV-2) nucleic acids may be present in the submitted sample  additional confirmatory testing may be necessary for epidemiological  and / or clinical management purposes  to differentiate between  SARS-CoV-2 and other Sarbecovirus currently known to infect humans.  If clinically indicated additional testing with an alternate test  methodology [id number]) is advised. The SARS-CoV-2 RNA is generally  detectable in upper and lower respiratory sp ecimens during the acute  phase of infection. The expected result is Negative. Fact Sheet for Patients:  BoilerBrush.com.cyhttps://www.fda.gov/media/136312/download Fact Sheet for Healthcare Providers: https://pope.com/https://www.fda.gov/media/136313/download This test is not yet approved or cleared by the [place] FDA and has been authorized for detection and/or diagnosis of SARS-CoV-2 by FDA under an Emergency Use Authorization (EUA).  This EUA will remain in effect (meaning this test can be used) for the duration of the COVID-19 declaration under Section 564(b)(1) of the Act, 21 U.S.C. section 360bbb-3(b)(1), unless the authorization is terminated or revoked sooner. Performed at [facility] [place] [facility], [address] [place], [place] [address]   Body fluid culture     Status: None   Collection  Time: 02/09/19  1:00 PM   Specimen: Abscess  Result Value Ref Range Status   Specimen Description   Final    ABSCESS Performed at [facility], [address] [place], [place] [address]    Special Requests   Final    NONE Performed at [facility], [address] [place], [place] [address]    Gram Stain   Final    ABUNDANT WBC PRESENT, PREDOMINANTLY PMN ABUNDANT GRAM POSITIVE COCCI IN PAIRS ABUNDANT GRAM NEGATIVE RODS RARE GRAM POSITIVE RODS Performed at [facility], [address] [place], [place] [address]    Culture   Final    MODERATE ESCHERICHIA COLI ABUNDANT STREPTOCOCCUS ANGINOSIS MIXED ANAEROBIC FLORA PRESENT.  CALL LAB IF FURTHER IID REQUIRED.    Report Status 02/12/2019 FINAL  Final   Organism ID, Bacteria ESCHERICHIA COLI  Final      Susceptibility   Escherichia coli - MIC*    AMPICILLIN 4 SENSITIVE Sensitive     CEFAZOLIN <=4 SENSITIVE Sensitive     CEFEPIME <=1 SENSITIVE Sensitive     CEFTAZIDIME <=1 SENSITIVE Sensitive     CEFTRIAXONE <=1 SENSITIVE Sensitive     CIPROFLOXACIN <=0.25 SENSITIVE Sensitive     GENTAMICIN <=1 SENSITIVE Sensitive     IMIPENEM <=0.25 SENSITIVE Sensitive     TRIMETH/SULFA <=20 SENSITIVE Sensitive     AMPICILLIN/SULBACTAM <=2 SENSITIVE Sensitive     PIP/TAZO <=4 SENSITIVE Sensitive     Extended ESBL NEGATIVE Sensitive     * MODERATE ESCHERICHIA COLI     Studies: Ct Abdomen Pelvis W Contrast  Result Date: 02/12/2019 CLINICAL DATA:  Left lower quadrant abdominal pain around abscess drainage. EXAM: CT ABDOMEN AND PELVIS WITH CONTRAST TECHNIQUE: Multidetector CT imaging of the abdomen and pelvis was performed using the standard protocol following bolus administration of intravenous contrast. CONTRAST:  OMNIPAQUE IOHEXOL 300 MG/ML  SOLN COMPARISON:  CT scan of February 09, 2019. FINDINGS: Lower chest: No acute abnormality. Hepatobiliary: No focal liver abnormality is seen. No  gallstones, gallbladder wall thickening, or biliary dilatation. Pancreas: Unremarkable. No pancreatic ductal dilatation or surrounding inflammatory changes. Spleen: Normal in size without focal abnormality. Adrenals/Urinary Tract: Adrenal glands are unremarkable. Kidneys are normal, without renal calculi, focal lesion, or hydronephrosis. Bladder is unremarkable. Stomach/Bowel: The stomach appears normal. The appendix appears normal. There is no evidence of bowel obstruction. Inflammatory changes are noted around the proximal sigmoid colon consistent with diverticulitis. Pigtail drainage catheter is seen in the para diverticular abscess. The abscess is significantly decreased in size, with its superior portion measuring only 4.5 x 4.2 cm currently. Small amount of residual fluid remains, with the interval development of air within the cavity. The inferior portion is slightly smaller. Vascular/Lymphatic: No significant vascular findings are present. No enlarged abdominal or pelvic lymph nodes. Reproductive: The uterus is unremarkable. Right adnexal region is unremarkable. Left adnexal region appears to be involved in the inflammation described above. Other: No abdominal wall hernia or abnormality. No abdominopelvic ascites. Musculoskeletal: No acute or significant osseous findings.   IMPRESSION: Interval placement of drainage catheter into abscess adjacent to sigmoid diverticulitis. The abscess is significantly decreased in size, particularly its superior portion, with only a small amount of residual fluid remaining in the superior portion, with slightly decreased amount of fluid seen in the inferior portion. Electronically Signed   By: Lupita Raider M.D.   On: 02/12/2019 15:35    Scheduled Meds: . amLODipine  5 mg Oral Daily  . atorvastatin  80 mg Oral QPM  . enoxaparin (LOVENOX) injection  60 mg Subcutaneous Q24H  . hydrochlorothiazide  25 mg Oral Daily  . hydrocortisone   Rectal BID  . insulin aspart   0-15 Units Subcutaneous TID WC  . losartan  50 mg Oral Daily  . saccharomyces boulardii  250 mg Oral BID  . sodium chloride flush  5 mL Intracatheter Q8H    Continuous Infusions: . piperacillin-tazobactam (ZOSYN)  IV 3.375 g (02/13/19 0510)     Joycelyn Das, MD  [facility] 02/13/2019

## 2019-02-14 ENCOUNTER — Other Ambulatory Visit: Payer: Self-pay | Admitting: General Surgery

## 2019-02-14 DIAGNOSIS — Z98891 History of uterine scar from previous surgery: Secondary | ICD-10-CM

## 2019-02-14 DIAGNOSIS — K5720 Diverticulitis of large intestine with perforation and abscess without bleeding: Secondary | ICD-10-CM

## 2019-02-14 LAB — BASIC METABOLIC PANEL
Anion gap: 9 (ref 5–15)
BUN: 8 mg/dL (ref 6–20)
CO2: 24 mmol/L (ref 22–32)
Calcium: 9.7 mg/dL (ref 8.9–10.3)
Chloride: 106 mmol/L (ref 98–111)
Creatinine, Ser: 0.74 mg/dL (ref 0.44–1.00)
GFR calc Af Amer: >60 mL/min (ref >60)
GFR calc non Af Amer: >60 mL/min (ref >60)
Glucose, Bld: 112 mg/dL — ABNORMAL HIGH (ref 70–99)
Potassium: 4.3 mmol/L (ref 3.5–5.1)
Sodium: 139 mmol/L (ref 135–145)

## 2019-02-14 LAB — CBC
HCT: 34.7 % — ABNORMAL LOW (ref 36.0–46.0)
Hemoglobin: 10.8 g/dL — ABNORMAL LOW (ref 12.0–15.0)
MCH: 27.8 pg (ref 26.0–34.0)
MCHC: 31.1 g/dL (ref 30.0–36.0)
MCV: 89.2 fL (ref 80.0–100.0)
Platelets: 311 10*3/uL (ref 150–400)
RBC: 3.89 MIL/uL (ref 3.87–5.11)
RDW: 14.6 % (ref 11.5–15.5)
WBC: 9.6 10*3/uL (ref 4.0–10.5)
nRBC: 0 % (ref 0.0–0.2)

## 2019-02-14 LAB — GLUCOSE, CAPILLARY
Glucose-Capillary: 105 mg/dL — ABNORMAL HIGH (ref 70–99)
Glucose-Capillary: 159 mg/dL — ABNORMAL HIGH (ref 70–99)

## 2019-02-14 MED ORDER — SACCHAROMYCES BOULARDII 250 MG PO CAPS: 250.0000 mg | ORAL_CAPSULE | Freq: Two times a day (BID) | ORAL | 0 refills | Status: DC

## 2019-02-14 MED ORDER — AMOXICILLIN-POT CLAVULANATE 875-125 MG PO TABS: 1.0000 | ORAL_TABLET | Freq: Two times a day (BID) | ORAL | 0 refills | Status: AC

## 2019-02-14 MED ORDER — HYDROCORTISONE (PERIANAL) 2.5 % EX CREA: TOPICAL_CREAM | Freq: Two times a day (BID) | CUTANEOUS | 0 refills | Status: DC

## 2019-02-14 NOTE — Progress Notes (Signed)
[facility] Progress Note     Subjective: CC-  Overall improving. Still having some LLQ discomfort with bowel movements, but otherwise she has little to no pain. Denies n/v. Appetite improving. BM yesterday. WBC 9.6.  Objective: Vital signs in last 24 hours: Temp:  [98.2 F (36.8 C)-99.1 F (37.3 C)] 99 F (37.2 C) (09/18 0438) Pulse Rate:  [66-74] 66 (09/18 0438) Resp:  [16-20] 18 (09/18 0438) BP: (98-118)/(58-72) 102/58 (09/18 0438) SpO2:  [96 %-100 %] 96 % (09/18 0438) Last BM Date: 02/13/19  Intake/Output from previous day: 09/17 0701 - 09/18 0700 In: 485 [P.O.:480] Out: 30 [Drains:30] Intake/Output this shift: No intake/output data recorded.  PE: Gen: Alert, NAD, pleasant HEENT: EOM's intact, pupils equal and round Pulm:rate andeffort normal RJJ:OACZY, soft,ND, nontender, +BS, no HSM, drain with purulent/feculent output SAY:TKZSWF soft and nontender Psych: A&Ox3  Skin: no rashes noted, warm and dry   Lab Results:  Recent Labs    02/13/19 0416 02/14/19 0406  WBC 10.9* 9.6  HGB 10.2* 10.8*  HCT 33.1* 34.7*  PLT 287 311   BMET Recent Labs    02/13/19 0416 02/14/19 0406  NA 138 139  K 4.4 4.3  CL 111 106  CO2 22 24  GLUCOSE 165* 112*  BUN 7 8  CREATININE 0.73 0.74  CALCIUM 8.8* 9.7   PT/INR No results for input(s): LABPROT, INR in the last 72 hours. CMP     Component Value Date/Time   NA 139 02/14/2019 0406   K 4.3 02/14/2019 0406   CL 106 02/14/2019 0406   CO2 24 02/14/2019 0406   GLUCOSE 112 (H) 02/14/2019 0406   BUN 8 02/14/2019 0406   CREATININE 0.74 02/14/2019 0406   CALCIUM 9.7 02/14/2019 0406   PROT 7.1 02/13/2019 0416   ALBUMIN 2.8 (L) 02/13/2019 0416   AST 23 02/13/2019 0416   ALT 23 02/13/2019 0416   ALKPHOS 62 02/13/2019 0416   BILITOT 0.2 (L) 02/13/2019 0416   GFRNONAA >60 02/14/2019 0406   GFRAA >60 02/14/2019 0406   Lipase     Component Value Date/Time   LIPASE 25 02/01/2019 2054        Studies/Results: Ct Abdomen Pelvis W Contrast  Result Date: 02/12/2019 CLINICAL DATA:  Left lower quadrant abdominal pain around abscess drainage. EXAM: CT ABDOMEN AND PELVIS WITH CONTRAST TECHNIQUE: Multidetector CT imaging of the abdomen and pelvis was performed using the standard protocol following bolus administration of intravenous contrast. CONTRAST:  154mL OMNIPAQUE IOHEXOL 300 MG/ML  SOLN COMPARISON:  CT scan of February 09, 2019. FINDINGS: Lower chest: No acute abnormality. Hepatobiliary: No focal liver abnormality is seen. No gallstones, gallbladder wall thickening, or biliary dilatation. Pancreas: Unremarkable. No pancreatic ductal dilatation or surrounding inflammatory changes. Spleen: Normal in size without focal abnormality. Adrenals/Urinary Tract: Adrenal glands are unremarkable. Kidneys are normal, without renal calculi, focal lesion, or hydronephrosis. Bladder is unremarkable. Stomach/Bowel: The stomach appears normal. The appendix appears normal. There is no evidence of bowel obstruction. Inflammatory changes are noted around the proximal sigmoid colon consistent with diverticulitis. Pigtail drainage catheter is seen in the para diverticular abscess. The abscess is significantly decreased in size, with its superior portion measuring only 4.5 x 4.2 cm currently. Small amount of residual fluid remains, with the interval development of air within the cavity. The inferior portion is slightly smaller. Vascular/Lymphatic: No significant vascular findings are present. No enlarged abdominal or pelvic lymph nodes. Reproductive: The uterus is unremarkable. Right adnexal region is unremarkable. Left adnexal region  appears to be involved in the inflammation described above. Other: No abdominal wall hernia or abnormality. No abdominopelvic ascites. Musculoskeletal: No acute or significant osseous findings. IMPRESSION: Interval placement of drainage catheter into abscess adjacent to sigmoid  diverticulitis. The abscess is significantly decreased in size, particularly its superior portion, with only a small amount of residual fluid remaining in the superior portion, with slightly decreased amount of fluid seen in the inferior portion. Electronically Signed   By: Lupita RaiderJames  Green Jr M.D.   On: 02/12/2019 15:35    Anti-infectives: Anti-infectives (From admission, onward)   Start     Dose/Rate Route Frequency Ordered Stop   02/09/19 1200  piperacillin-tazobactam (ZOSYN) IVPB 3.375 g     3.375 g 12.5 mL/hr over 240 Minutes Intravenous Every 8 hours 02/09/19 0600     02/09/19 0400  piperacillin-tazobactam (ZOSYN) IVPB 3.375 g     3.375 g 12.5 mL/hr over 240 Minutes Intravenous  Once 02/09/19 0355 02/09/19 0805       Assessment/Plan DM HTN Obesity  Diverticular abscess involving Leftovary - S/p perc drainage 9/13 >>culture showingE COLI pansensitive - CT scan 9/16 showed persist sigmoid diverticulitis but abscess is significantly decreased in size s/p drain placement.  - WBC9.6, TMAX 99.1  ID -zosyn 9/13>> FEN -oft diet VTE -SCDs, lovenox Foley -none Follow up -IR, Dr. Maisie Fushomas  Plan-Patient stable for discharge from surgical standpoint. IR and general surgery follow up info on AVS. Recommend discharge home with drain and 2 weeks of oral antibiotics (augmentin). Patient knows to call with any concerns (ie fever, increased pain, n/v...).   LOS: 5 days    Franne FortsBrooke A  , [facility] 02/14/2019, 9:17 AM Pager: [phone] Mon-Thurs 7:00 am-4:30 pm Fri 7:00 am -11:30 AM Sat-Sun 7:00 am-11:30 am

## 2019-02-14 NOTE — Progress Notes (Signed)
Patient discharged home, discharge instructions given and explained to patient, Patient demonstrated JP drain/care and stated she is comfortable managing it at home. she verbalized understanding, patient denies any pain/distress. No wound noted, skin intact.  Accompanied home by friend.

## 2019-02-14 NOTE — Discharge Summary (Signed)
Physician Discharge Summary  Robyn [name] BJY:[id number] DOB: [birth year] DOA: 02/09/2019  PCP: Maudie FlakesAnderson, Shane D, FNP  Admit date: 02/09/2019 Discharge date: 02/14/2019  Admitted From: Home  Discharge disposition: home   Recommendations for Outpatient Follow-Up:    Follow up with interventional radiology and general surgery as outpatient.   Discharge Diagnosis:   Principal Problem:   Colonic diverticular abscess Active Problems:   Obesity   PCOS (polycystic ovarian syndrome)   Hypertension - on Labetalol 200 mg po bid   Diabetes mellitus type 2, uncontrolled, without complications -- dx'd in 2011   S/P repeat low transverse C-section   Diverticulitis   Discharge Condition: Improved.  Diet recommendation: Soft, carbohydrate-modified.   Wound care: Continue drain care  Code status: Full.   History of Present Illness:  Patient is a 38 year old female with a history of diabetes mellitus 2, PCOS, morbid obesity [facility] with left lower quadrant abdominal pain worsening over the last few weeks. CT abdomen pelvis showed left-sided diverticulitis with associated phlegmon and possible involvement of ovary. [facility], general surgery, gynecology was consulted. Patient was placed on IV fluids and IV Zosyn.  Patient underwent CT-guided drain placement on 02/09/19 by interventional radiology.  Hospital Course:   Following conditions were addressed during hospitalization,  Colonic diverticular abscess involving the left ovary -CT abdomen showed left-sided diverticulitis with associated phlegmon and possible involvement of left ovary. General surgery and gynecology consulted. Patient underwent CT-guided drain placement on 9/13, 25 cc pus aspirated,cultures showing E. coli, pansensitive.  Afebrile at this time.  Received IV Zosyn.  Will change to oral antibiotics on discharge-maintain p.o. twice daily for 14  days as recommended by surgery. CT scan of the abdomen done on 02/12/2019 showed decreased abscess.    PCOS (polycystic ovarian syndrome) Gynecology was consulted.  Essential hypertension  -BP stable, continue Norvasc, losartan, HCTZ on discharge.  Diabetes mellitus type 2 with hyperglycemia: Without complications Resume home medication  Hyperlipidemia Continue Lipitor  Morbid obesity -BMI 43, -Counseled on healthy lifestyle, diet and weight control  Disposition: At this time, patient is stable for disposition home.  She will follow-up with your primary care physician, general surgery and IR as outpatient.    Medical Consultants:    General surgery,   interventional radiology,   gynecology   Subjective:   Today, patient feels okay.  Denies any nausea, vomiting or abdominal pain.  No fever chills or rigor.  Tolerating oral diet.  Discharge Exam:   Vitals:   02/14/19 0438 02/14/19 0953  BP: (!) 102/58 110/66  Pulse: 66   Resp: 18   Temp: 99 F (37.2 C)   SpO2: 96%    Vitals:   02/13/19 1422 02/13/19 2113 02/14/19 0438 02/14/19 0953  BP: 98/62 118/72 (!) 102/58 110/66  Pulse: 74 69 66   Resp: 20 16 18    Temp: 98.2 F (36.8 C) 99.1 F (37.3 C) 99 F (37.2 C)   TempSrc: Oral Oral Oral   SpO2: 97% 100% 96%   Weight:      Height:        General exam: Appears calm and comfortable ,Not in distress.  Obese HEENT:PERRL,Oral mucosa moist Respiratory system: Bilateral equal air entry, normal vesicular breath sounds, no wheezes or crackles  Cardiovascular system: S1 & S2 heard, RRR.  Gastrointestinal system: Abdomen is nondistended, obese soft and nontender. No organomegaly or masses felt. Normal bowel sounds heard.  Mild tenderness over the  left lower quadrant drain site without erythema or induration. Central nervous system: Alert and oriented. No focal neurological deficits. Extremities: No edema, no clubbing ,no cyanosis, distal peripheral pulses  palpable. Skin: No rashes, lesions or ulcers,no icterus ,no pallor.  Left lower quadrant of the abdomen with drain MSK: Normal muscle bulk,tone ,power    Procedures:     CT-guided drain placement of the left lower quadrant on 02/09/2019  The results of significant diagnostics from this hospitalization (including imaging, microbiology, ancillary and laboratory) are listed below for reference.     Diagnostic Studies:   Ct Abdomen Pelvis W Contrast  Result Date: 02/09/2019 CLINICAL DATA:  No diverticulitis, follow-up or though fluid EXAM: CT ABDOMEN AND PELVIS WITH CONTRAST TECHNIQUE: Multidetector CT imaging of the abdomen and pelvis was performed using the standard protocol following bolus administration of intravenous contrast. CONTRAST:  100mL OMNIPAQUE IOHEXOL 300 MG/ML  SOLN COMPARISON:  CT 02/02/2019 FINDINGS: Lower chest: Dependent basilar atelectasis. Lung bases are otherwise clear Normal heart size. No pericardial effusion. Hepatobiliary: No focal liver abnormality is seen. No gallstones, gallbladder wall thickening, or biliary dilatation. Pancreas: Unremarkable. No pancreatic ductal dilatation or surrounding inflammatory changes. Spleen: Normal in size without focal abnormality. Adrenals/Urinary Tract: Adrenal glands are unremarkable. Lobulation tissue in the left renal hilum appears to reflect atypical region of scarring or persistent fetal lobulation, incompletely characterized on this single-phase study. Kidneys are otherwise unremarkable, without renal calculi, suspicious lesion, or hydronephrosis. Asymmetric anterior bladder wall thickening, possibly reactive. Stomach/Bowel: Distal esophagus, stomach and duodenal sweep are unremarkable. No bowel wall thickening or dilatation. There is thickening of the mid sigmoid colon and interval development of a complex, multiloculated, rim enhancing collection extending from the wall of the colon and involving the directly adjacent ovary.  Conglomerate size measures approximately 7.6 x 7.2 x 8.2 cm. Extensive adjacent phlegmon and trace reactive free fluid is present. Notably, there is no discernible gas within the collection. More distal colon is largely decompressed. No evidence of resultant bowel obstruction. Vascular/Lymphatic: The aorta is normal caliber. No suspicious or enlarged lymph nodes in the included lymphatic chains. Reactive adenopathy noted in the left inguinal region. Reproductive: Anteverted uterus with slight fundal retroflexion and features of prior Caesarean section. Right adnexa is unremarkable. Marked enlargement of the left ovary with associated rim enhancing multiloculated collection with loss of discernible fat plane along the adjacent colon and lateral aspect of the uterine fundus, further detailed on bowel section above. Other: Small amount of reactive fluid and extensive phlegmonous change in the left lower quadrant. No bowel containing hernia. Postsurgical changes from prior low vertical and transverse abdominal incisions. Musculoskeletal: Multilevel degenerative changes are present in the imaged portions of the spine. IMPRESSION: 1. Interval development of a complex, multiloculated, rim enhancing collection extending from the wall of the sigmoid colon and involving the directly adjacent ovary and uterine adnexa. Extensive adjacent phlegmon and trace reactive free fluid. No discernible gas within the collection. Given rapid progression and concomitant leukocytosis, favor the result of diverticulitis with abscess formation and possible colo-ovarian fistulization. Alternatively, a cystic ovarian neoplasm with direct involvement of the adjacent colon and resulting superinfection could provide a similar appearance. 2. Asymmetric anterior bladder wall thickening, possibly reactive. Correlate with urinalysis to exclude cystitis. 3. Lobular soft tissue in the left renal hilum appears to reflect atypical region of scarring or  persistent fetal lobulation, incompletely characterized on this single-phase study. Could consider further evaluation with renal ultrasound or MR. These results were called by telephone at  the time of interpretation on 02/09/2019 at 3:18 am to provider Geoffery Lyons , who verbally acknowledged these results. Electronically Signed   By: Kreg Shropshire M.D.   On: 02/09/2019 03:18   Ct Image Guided Drainage By Percutaneous Catheter  Result Date: 02/09/2019 INDICATION: Enlarging left lower quadrant pelvic abscess, concern for diverticular abscess versus tubo-ovarian abscess EXAM: CT GUIDED DRAINAGE OF LEFT LOWER QUADRANT PELVIC ABSCESS MEDICATIONS: The patient is currently admitted to the hospital and receiving intravenous antibiotics. The antibiotics were administered within an appropriate time frame prior to the initiation of the procedure. ANESTHESIA/SEDATION: 3.0 mg IV Versed 100 mcg IV Fentanyl Moderate Sedation Time:  14 minutes The patient was continuously monitored during the procedure by the interventional radiology nurse under my direct supervision. COMPLICATIONS: None immediate. TECHNIQUE: Informed written consent was obtained from the patient after a thorough discussion of the procedural risks, benefits and alternatives. All questions were addressed. Maximal Sterile Barrier Technique was utilized including caps, mask, sterile gowns, sterile gloves, sterile drape, hand hygiene and skin antiseptic. A timeout was performed prior to the initiation of the procedure. PROCEDURE: The left lower quadrant was prepped with ChloraPrep in a sterile fashion, and a sterile drape was applied covering the operative field. A sterile gown and sterile gloves were used for the procedure. Local anesthesia was provided with 1% Lidocaine. Previous imaging reviewed. Patient positioned supine. Noncontrast localization CT performed. The enlarging left lower quadrant pelvic abscess adjacent to the sigmoid colon and left adnexa was  localized and marked. Under sterile conditions and local anesthesia, an 18 gauge 15 cm access needle was advanced from an anterior oblique approach into the left lower quadrant pelvic abscess. Syringe aspiration yielded purulent fluid. Sample sent for cultures. Guidewire inserted followed by tract dilatation insert a 10 French drain. Drain catheter position confirmed with CT. Catheter secured with a Prolene suture. 25 cc bloody purulent fluid aspirated. Saline flush performed followed by external suction bulb. Sterile dressing applied. No immediate complication. Patient tolerated the procedure well. FINDINGS: Imaging confirms needle placement for abscess drain in the left lower quadrant. IMPRESSION: Successful CT-guided left lower quadrant pelvic abscess drain placement. Electronically Signed   By: Judie Petit.  Shick M.D.   On: 02/09/2019 13:26     Labs:   Basic Metabolic Panel: Recent Labs  Lab 02/10/19 0524 02/11/19 0445 02/12/19 0413 02/13/19 0416 02/14/19 0406  NA 137 137 137 138 139  K 3.2* 3.6 4.4 4.4 4.3  CL 98 102 107 111 106  CO2 28 24 22 22 24   GLUCOSE 125* 130* 147* 165* 112*  BUN 7 7 8 7 8   CREATININE 0.74 0.67 0.65 0.73 0.74  CALCIUM 8.5* 8.8* 8.8* 8.8* 9.7  MG  --   --   --  1.9  --    GFR Estimated Creatinine Clearance: 135.3 mL/min (by C-G formula based on SCr of 0.74 mg/dL). Liver Function Tests: Recent Labs  Lab 02/09/19 0150 02/13/19 0416  AST 10* 23  ALT 13 23  ALKPHOS 71 62  BILITOT 0.4 0.2*  PROT 7.8 7.1  ALBUMIN 3.1* 2.8*   No results for input(s): LIPASE, AMYLASE in the last 168 hours. No results for input(s): AMMONIA in the last 168 hours. Coagulation profile No results for input(s): INR, PROTIME in the last 168 hours.  CBC: Recent Labs  Lab 02/09/19 0150 02/10/19 0524 02/11/19 0445 02/12/19 0413 02/13/19 0416 02/14/19 0406  WBC 18.0* 12.7* 12.2* 9.0 10.9* 9.6  NEUTROABS 13.6*  --   --   --   --   --  HGB 11.3* 11.1* 10.8* 10.8* 10.2* 10.8*  HCT  35.2* 35.9* 35.2* 35.6* 33.1* 34.7*  MCV 85.4 88.2 88.0 89.4 89.7 89.2  PLT 310 309 301 323 287 311   Cardiac Enzymes: No results for input(s): CKTOTAL, CKMB, CKMBINDEX, TROPONINI in the last 168 hours. BNP: Invalid input(s): POCBNP CBG: Recent Labs  Lab 02/13/19 0856 02/13/19 1150 02/13/19 1644 02/13/19 2112 02/14/19 0746  GLUCAP 158* 182* 91 150* 105*   D-Dimer No results for input(s): DDIMER in the last 72 hours. Hgb A1c No results for input(s): HGBA1C in the last 72 hours. Lipid Profile No results for input(s): CHOL, HDL, LDLCALC, TRIG, CHOLHDL, LDLDIRECT in the last 72 hours. Thyroid function studies No results for input(s): TSH, T4TOTAL, T3FREE, THYROIDAB in the last 72 hours.  Invalid input(s): FREET3 Anemia work up No results for input(s): VITAMINB12, FOLATE, FERRITIN, TIBC, IRON, RETICCTPCT in the last 72 hours. Microbiology Recent Results (from the past 240 hour(s))  Urine culture     Status: None   Collection Time: 02/09/19  1:01 AM   Specimen: Urine, Clean Catch  Result Value Ref Range Status   Specimen Description   Final    URINE, CLEAN CATCH Performed at [facility], [address] [place], [place] [address]    Special Requests   Final    NONE Performed at [facility], [address] [place], [place] [address]    Culture   Final    Multiple bacterial morphotypes present, none predominant. Suggest appropriate recollection if clinically indicated.   Report Status 02/13/2019 FINAL  Final  SARS Coronavirus 2 [facility] order, Performed in [facility] hospital lab) Nasopharyngeal Nasopharyngeal Swab     Status: None   Collection Time: 02/09/19  4:04 AM   Specimen: Nasopharyngeal Swab  Result Value Ref Range Status   SARS Coronavirus 2 NEGATIVE NEGATIVE Final    Comment: (NOTE) If result is NEGATIVE SARS-CoV-2 target nucleic acids are NOT DETECTED. The SARS-CoV-2 RNA is generally detectable in upper and lower  respiratory  specimens during the acute phase of infection. The lowest  concentration of SARS-CoV-2 viral copies this assay can detect is 250  copies / mL. A negative result does not preclude SARS-CoV-2 infection  and should not be used as the sole basis for treatment or other  patient management decisions.  A negative result may occur with  improper specimen collection / handling, submission of specimen other  than nasopharyngeal swab, presence of viral mutation(s) within the  areas targeted by this assay, and inadequate number of viral copies  (<250 copies / mL). A negative result must be combined with clinical  observations, patient history, and epidemiological information. If result is POSITIVE SARS-CoV-2 target nucleic acids are DETECTED. The SARS-CoV-2 RNA is generally detectable in upper and lower  respiratory specimens dur ing the acute phase of infection.  Positive  results are indicative of active infection with SARS-CoV-2.  Clinical  correlation with patient history and other diagnostic information is  necessary to determine patient infection status.  Positive results do  not rule out bacterial infection or co-infection with other viruses. If result is PRESUMPTIVE POSTIVE SARS-CoV-2 nucleic acids MAY BE PRESENT.   A presumptive positive result was obtained on the submitted specimen  and confirmed on repeat testing.  While 2019 novel coronavirus  (SARS-CoV-2) nucleic acids may be present in the submitted sample  additional confirmatory testing may be necessary for epidemiological  and / or clinical management purposes  to  differentiate between  SARS-CoV-2 and other Sarbecovirus currently known to infect humans.  If clinically indicated additional testing with an alternate test  methodology [phone]) is advised. The SARS-CoV-2 RNA is generally  detectable in upper and lower respiratory sp ecimens during the acute  phase of infection. The expected result is Negative. Fact Sheet for  Patients:  BoilerBrush.com.cy Fact Sheet for Healthcare Providers: https://pope.com/ This test is not yet approved or cleared by the [place] FDA and has been authorized for detection and/or diagnosis of SARS-CoV-2 by FDA under an Emergency Use Authorization (EUA).  This EUA will remain in effect (meaning this test can be used) for the duration of the COVID-19 declaration under Section 564(b)(1) of the Act, 21 U.S.C. section 360bbb-3(b)(1), unless the authorization is terminated or revoked sooner. Performed at [facility], [address] [place], [place] [address]   Body fluid culture     Status: None   Collection Time: 02/09/19  1:00 PM   Specimen: Abscess  Result Value Ref Range Status   Specimen Description   Final    ABSCESS Performed at [facility], [address], [place], [place] [address]    Special Requests   Final    NONE Performed at [facility], [address] [place], [place] [address]    Gram Stain   Final    ABUNDANT WBC PRESENT, PREDOMINANTLY PMN ABUNDANT GRAM POSITIVE COCCI IN PAIRS ABUNDANT GRAM NEGATIVE RODS RARE GRAM POSITIVE RODS Performed at [facility], [address] [place], [place] [address]    Culture   Final    MODERATE ESCHERICHIA COLI ABUNDANT STREPTOCOCCUS ANGINOSIS MIXED ANAEROBIC FLORA PRESENT.  CALL LAB IF FURTHER IID REQUIRED.    Report Status 02/12/2019 FINAL  Final   Organism ID, Bacteria ESCHERICHIA COLI  Final      Susceptibility   Escherichia coli - MIC*    AMPICILLIN 4 SENSITIVE Sensitive     CEFAZOLIN <=4 SENSITIVE Sensitive     CEFEPIME <=1 SENSITIVE Sensitive     CEFTAZIDIME <=1 SENSITIVE Sensitive     CEFTRIAXONE <=1 SENSITIVE Sensitive     CIPROFLOXACIN <=0.25 SENSITIVE Sensitive     GENTAMICIN <=1 SENSITIVE Sensitive     IMIPENEM <=0.25 SENSITIVE Sensitive     TRIMETH/SULFA <=20 SENSITIVE Sensitive      AMPICILLIN/SULBACTAM <=2 SENSITIVE Sensitive     PIP/TAZO <=4 SENSITIVE Sensitive     Extended ESBL NEGATIVE Sensitive     * MODERATE ESCHERICHIA COLI     Discharge Instructions:   Discharge Instructions    Diet Carb Modified   Complete by: As directed    Soft diet   Discharge instructions   Complete by: As directed    Please follow-up with interventional radiology for checking the drain tube and follow-up with surgery subsequently as has been scheduled..  Continue medications as prescribed including antibiotic.   Increase activity slowly   Complete by: As directed      Allergies as of 02/14/2019   No Known Allergies     Medication List    TAKE these medications   amoxicillin-clavulanate 875-125 MG tablet Commonly known as: Augmentin Take 1 tablet by mouth 2 (two) times daily for 14 days.   atorvastatin 80 MG tablet Commonly known as: LIPITOR Take 80 mg by mouth every evening.   glipiZIDE 5 MG tablet Commonly known as: GLUCOTROL Take 5 mg by mouth 2 (two) times daily.   glucose blood test strip Commonly known  as: OneTouch Verio 1 each by Other route 4 (four) times daily -  before meals and at bedtime. And lancets 4/day   hydrochlorothiazide 25 MG tablet Commonly known as: HYDRODIURIL Take 25 mg by mouth daily.   HYDROcodone-acetaminophen 5-325 MG tablet Commonly known as: NORCO/VICODIN Take 1 tablet by mouth every 4 (four) hours as needed. What changed: reasons to take this   hydrocortisone 2.5 % rectal cream Commonly known as: ANUSOL-HC Place rectally 2 (two) times daily.   losartan 50 MG tablet Commonly known as: COZAAR Take 50 mg by mouth daily.   metFORMIN 500 MG 24 hr tablet Commonly known as: GLUCOPHAGE-XR Take 1,000 mg by mouth 2 (two) times daily.   ondansetron 4 MG disintegrating tablet Commonly known as: Zofran ODT Take 1 tablet (4 mg total) by mouth every 6 (six) hours as needed. What changed: reasons to take this   saccharomyces  boulardii 250 MG capsule Commonly known as: FLORASTOR Take 1 capsule (250 mg total) by mouth 2 (two) times daily.      Follow-up Information    Romie Levee, MD. Go on 02/25/2019.   Specialty: General Surgery Why: Your appointment is 02/25/19 @ 10:50am Please arrive 30 minutes prior to your appointment to check in and fill out paperwork. Bring photo ID and insurance information. Contact information: [address] [place] [address] [phone]        Berdine Dance, MD Follow up in 1 week(s).   Specialties: Interventional Radiology, Radiology Why: IR scheduler will call you with appointment date/time. Please call [phone] with any questions or concerns prior to your appointment.  Contact information: [address] [place] [address] [phone]          Time coordinating discharge: 39 minutes  Signed:  Torry Istre  [facility] 02/14/2019, 10:54 AM

## 2019-02-16 DIAGNOSIS — K5790 Diverticulosis of intestine, part unspecified, without perforation or abscess without bleeding: Secondary | ICD-10-CM

## 2019-02-16 HISTORY — DX: Diverticulosis of intestine, part unspecified, without perforation or abscess without bleeding: K57.90

## 2019-02-20 ENCOUNTER — Other Ambulatory Visit: Payer: Medicaid Other

## 2019-02-20 ENCOUNTER — Other Ambulatory Visit: Payer: Self-pay | Admitting: General Surgery

## 2019-02-20 ENCOUNTER — Other Ambulatory Visit: Payer: Self-pay

## 2019-02-20 ENCOUNTER — Ambulatory Visit
Admission: RE | Admit: 2019-02-20 | Discharge: 2019-02-20 | Disposition: A | Discharge status: Home / Self Care | Payer: Self-pay | Source: Ambulatory Visit | Attending: General Surgery | Admitting: General Surgery

## 2019-02-20 ENCOUNTER — Encounter: Payer: Self-pay | Admitting: Radiology

## 2019-02-20 ENCOUNTER — Ambulatory Visit
Admission: RE | Admit: 2019-02-20 | Discharge: 2019-02-20 | Disposition: A | Discharge status: Home / Self Care | Payer: Self-pay | Source: Ambulatory Visit | Attending: Physician Assistant | Admitting: Physician Assistant

## 2019-02-20 DIAGNOSIS — K5720 Diverticulitis of large intestine with perforation and abscess without bleeding: Secondary | ICD-10-CM

## 2019-02-20 HISTORY — PX: IR RADIOLOGIST EVAL & MGMT: IMG5224

## 2019-02-20 MED ORDER — IOPAMIDOL (ISOVUE-300) INJECTION 61%
125.0000 mL | Freq: Once | INTRAVENOUS | Status: AC | PRN
  Administered 2019-02-20: 125 mL via INTRAVENOUS

## 2019-02-20 NOTE — Progress Notes (Signed)
Referring Physician(s): Dr. Harlow Asa  Chief Complaint: The patient is seen in follow up today s/p diverticular abscess involving the left ovary requiring percutaneous drain placement  History of present illness:  Robyn [name] is a 38 year old female with a past medical history of polycystic ovaries, DM, HTN who presented to [facility] ED 02/08/19 with acute abdominal pain.  She was found to have a diverticular abscess involving the L ovary. She underwent drain placement 02/09/19.  Surgery as well as OBGYN were consulted for inpatient management.  She was started on antibiotics as her fluid was positive for E coli and Strep angionsis.  She was discharged home in improved condition with her drain in place.  She returns to Interventional Radiology clinic today for follow-up of her drain.   Robyn [name] reports she has continued with 10-15 mL of foul-smelling drainage at home.  She remains on Augmentin and Florastor.  She denies fever, chills, nausea, vomiting, abdominal pain. She has planned follow-up with Dr. Johney Maine next week.   Past Medical History:  Diagnosis Date  . Anxiety    h/o panic attacks  . Diabetes mellitus without complication (HCC)    diet controlled   . Herpes   . Hypertension   . Left ovarian cyst 11/29/2011  . PCOS (polycystic ovarian syndrome)   . Pregnancy induced hypertension   . Shortness of breath dyspnea    with exertion and while pregnant  . Smoker 11/29/2011    Past Surgical History:  Procedure Laterality Date  . CERVICAL CERCLAGE N/A 10/09/2014   Procedure: CERCLAGE CERVICAL;  Surgeon: Everett Graff, MD;  Location: [facility];  Service: Gynecology;  Laterality: N/A;  . CESAREAN SECTION     C/S x 2  . CESAREAN SECTION WITH BILATERAL TUBAL LIGATION Bilateral 03/23/2015   Procedure: CESAREAN SECTION WITH BILATERAL TUBAL LIGATION;  Surgeon: Everett Graff, MD;  Location: [facility];  Service: Obstetrics;  Laterality: Bilateral;  Amt OK'd to move 02/25/2015  . WISDOM TOOTH EXTRACTION       Allergies: Patient has no known allergies.  Medications: Prior to Admission medications   Medication Sig Start Date End Date Taking? Authorizing Provider  amoxicillin-clavulanate (AUGMENTIN) 875-125 MG tablet Take 1 tablet by mouth 2 (two) times daily for 14 days. 02/14/19 02/28/19  Pokhrel, Corrie Mckusick, MD  atorvastatin (LIPITOR) 80 MG tablet Take 80 mg by mouth every evening. 12/23/18   [provider]  glipiZIDE (GLUCOTROL) 5 MG tablet Take 5 mg by mouth 2 (two) times daily. 01/21/19   [provider]  glucose blood (ONETOUCH VERIO) test strip 1 each by Other route 4 (four) times daily -  before meals and at bedtime. And lancets 4/day 01/13/15   Renato Shin, MD  hydrochlorothiazide (HYDRODIURIL) 25 MG tablet Take 25 mg by mouth daily. 01/24/19   [provider]  HYDROcodone-acetaminophen (NORCO/VICODIN) 5-325 MG tablet Take 1 tablet by mouth every 4 (four) hours as needed. Patient taking differently: Take 1 tablet by mouth every 4 (four) hours as needed for moderate pain.  02/02/19   Ward, Delice Bison, DO  hydrocortisone (ANUSOL-HC) 2.5 % rectal cream Place rectally 2 (two) times daily. 02/14/19   Pokhrel, Corrie Mckusick, MD  losartan (COZAAR) 50 MG tablet Take 50 mg by mouth daily. 01/24/19   [provider]  metFORMIN (GLUCOPHAGE-XR) 500 MG 24 hr tablet Take 1,000 mg by mouth 2 (two) times daily. 12/23/18   [provider]  ondansetron (ZOFRAN ODT) 4 MG disintegrating tablet Take 1 tablet (4 mg total) by  mouth every 6 (six) hours as needed. Patient taking differently: Take 4 mg by mouth every 6 (six) hours as needed for nausea.  02/02/19   Ward, Layla Maw, DO  saccharomyces boulardii (FLORASTOR) 250 MG capsule Take 1 capsule (250 mg total) by mouth 2 (two) times daily. 02/14/19   Pokhrel, Rebekah Chesterfield, MD     Family History  Problem Relation Age of Onset  . Hypertension Mother   . Diabetes Father   . Stroke Father   . Cancer Maternal Grandmother   . Cancer Maternal  Uncle     Social History   Socioeconomic History  . Marital status: Married    Spouse name: Not on file  . Number of children: Not on file  . Years of education: Not on file  . Highest education level: Not on file  Occupational History  . Not on file  Social Needs  . Financial resource strain: Not on file  . Food insecurity    Worry: Not on file    Inability: Not on file  . Transportation needs    Medical: Not on file    Non-medical: Not on file  Tobacco Use  . Smoking status: Current Every Day Smoker    Packs/day: 0.25    Years: 13.00    Pack years: 3.25    Types: Cigarettes  . Smokeless tobacco: Never Used  Substance and Sexual Activity  . Alcohol use: Not Currently    Comment: socially and rarely  . Drug use: No  . Sexual activity: Never    Partners: Male    Birth control/protection: None  Lifestyle  . Physical activity    Days per week: Not on file    Minutes per session: Not on file  . Stress: Not on file  Relationships  . Social Musician on phone: Not on file    Gets together: Not on file    Attends religious service: Not on file    Active member of club or organization: Not on file    Attends meetings of clubs or organizations: Not on file    Relationship status: Not on file  Other Topics Concern  . Not on file  Social History Narrative  . Not on file     Vital Signs: LMP 01/21/2019   Physical Exam  NAD, alert Abdomen: soft, NTND.  LLQ drain in place with 10-20 mL foul-smelling, beige output. Insertion site c/d/i.   Imaging: No results found.  Labs:  CBC: Recent Labs    02/11/19 0445 02/12/19 0413 02/13/19 0416 02/14/19 0406  WBC 12.2* 9.0 10.9* 9.6  HGB 10.8* 10.8* 10.2* 10.8*  HCT 35.2* 35.6* 33.1* 34.7*  PLT 301 323 287 311    COAGS: No results for input(s): INR, APTT in the last 8760 hours.  BMP: Recent Labs    02/11/19 0445 02/12/19 0413 02/13/19 0416 02/14/19 0406  NA 137 137 138 139  K 3.6 4.4 4.4 4.3   CL 102 107 111 106  CO2 24 22 22 24   GLUCOSE 130* 147* 165* 112*  BUN 7 8 7 8   CALCIUM 8.8* 8.8* 8.8* 9.7  CREATININE 0.67 0.65 0.73 0.74  GFRNONAA >60 >60 >60 >60  GFRAA >60 >60 >60 >60    LIVER FUNCTION TESTS: Recent Labs    02/01/19 2054 02/09/19 0150 02/13/19 0416  BILITOT 0.5 0.4 0.2*  AST 15 10* 23  ALT 17 13 23   ALKPHOS 74 71 62  PROT 8.1 7.8 7.1  ALBUMIN  4.0 3.1* 2.8*    Assessment: Diverticular abscess involving the L ovary  Robyn [name] is a 38 year old female with history of polycystic ovarian syndrome who presented [facility] ED 9/24 with a diverticular abscess involving the left ovary.  She underwent percutaneous drain placement 02/09/19 and presents to IR clinic today for follow-up.  She has been improving on PO antibiotics at home.  She has continued to have 10-15 mL drainage from her catheter.  CT Abdomen/Pelvis performed and reviewed by Dr. Deanne CofferHassell who notes improvement in fluid collection, however small amount of fluid remains. Injection demonstrates a fistulous connection between the drain and adjacent bowel.  Patient instructed to continue with drain at home.  She is transitioned to a gravity bag.  She should continue to flush with 3-5 mL sterile saline daily.  She is encouraged to keep her appointment with Dr. Michaell CowingGross next week.  Follow-up with repeat imaging in approximately 2 weeks.   Signed: Hoyt KochKacie Sue-Ellen Gizelle Whetsel, PA 02/20/2019, 1:55 PM   Please refer to Dr. Deanne CofferHassell attestation of this note for management and plan.

## 2019-02-21 ENCOUNTER — Telehealth: Payer: Self-pay

## 2019-02-21 NOTE — Telephone Encounter (Signed)
Left message for pt to call the office to set up an appt.

## 2019-02-25 ENCOUNTER — Ambulatory Visit: Payer: Self-pay | Admitting: General Surgery

## 2019-02-25 NOTE — H&P (Signed)
History of Present Illness Romie Levee MD; 02/25/2019 11:55 AM) The patient is a 38 year old female who presents with diverticulitis. 38 year old female with a history of diabetes and morbid obesity who presented to [facility] [place] with worsening left lower quadrant pain over the last few weeks. CT scan showed left-sided diverticulitis with associated phlegmon and possible involvement of the left ovary. Patient was transferred to [facility] long and placed on IV fluids and IV antibiotics. She underwent a CT-guided drain placement on February 09, 2019. Follow-up CT scan shows decrease in abscess cavity. She is here today to discuss future plans. She is currently having no further pain and is having no further drainage from her drain.   Past Surgical History (April Staton, [place]; 02/25/2019 11:30 AM) Cesarean Section - Multiple  Diagnostic Studies History (April Staton, [place]; 02/25/2019 11:30 AM) Colonoscopy never Mammogram never Pap Smear 1-5 years ago  Allergies (April Staton, CMA; 02/25/2019 11:30 AM) No Known Drug Allergies [02/25/2019]:  Medication History (April Staton, CMA; 02/25/2019 11:32 AM) Amoxicillin-Pot Clavulanate (875-125MG  Tablet, Oral) Active. Atorvastatin Calcium (80MG  Tablet, Oral) Active. glipiZIDE (5MG  Tablet, Oral) Active. OneTouch Ultra (In Vitro) Active. hydroCHLOROthiazide (25MG  Tablet, Oral) Active. HYDROcodone-Acetaminophen (5-325MG  Tablet, Oral) Active. Anusol-HC (2.5% Cream, External) Active. Losartan Potassium (50MG  Tablet, Oral) Active. metFORMIN HCl ER (500MG  Tablet ER 24HR, Oral) Active. Ondansetron (4MG  Tablet Disint, Oral) Active. Saccharomyces boulardii (250MG  Capsule, Oral) Active. Medications Reconciled  Social History (April Staton, CMA; 02/25/2019 11:30 AM) Alcohol use Occasional alcohol use. Caffeine use Coffee. No drug use Tobacco use Current every day smoker.  Family History (April Staton, CMA; 02/25/2019 11:30  AM) Diabetes Mellitus Father. Hypertension Brother, Father, Mother.  Pregnancy / Birth History (April , ; 02/25/2019 11:30 AM) Age at menarche 10 years. Contraceptive History Contraceptive implant. Gravida 5 Length (months) of breastfeeding 3-6 Maternal age 15-20 Para 3 Regular periods  Other Problems (April Staton, CMA; 02/25/2019 11:30 AM) Diabetes Mellitus High blood pressure     Review of Systems (April Staton CMA; 02/25/2019 11:30 AM) General Not Present- Appetite Loss, Chills, Fatigue, Fever, Night Sweats, Weight Gain and Weight Loss. Skin Not Present- Change in Wart/Mole, Dryness, Hives, Jaundice, New Lesions, Non-Healing Wounds, Rash and Ulcer. HEENT Not Present- Earache, Hearing Loss, Hoarseness, Nose Bleed, Oral Ulcers, Ringing in the Ears, Seasonal Allergies, Sinus Pain, Sore Throat, Visual Disturbances, Wears glasses/contact lenses and Yellow Eyes. Respiratory Not Present- Bloody sputum, Chronic Cough, Difficulty Breathing, Snoring and Wheezing. Breast Not Present- Breast Mass, Breast Pain, Nipple Discharge and Skin Changes. Cardiovascular Not Present- Chest Pain, Difficulty Breathing Lying Down, Leg Cramps, Palpitations, Rapid Heart Rate, Shortness of Breath and Swelling of Extremities. Gastrointestinal Not Present- Abdominal Pain, Bloating, Bloody Stool, Change in Bowel Habits, Chronic diarrhea, Constipation, Difficulty Swallowing, Excessive gas, Gets full quickly at meals, Hemorrhoids, Indigestion, Nausea, Rectal Pain and Vomiting. Female Genitourinary Not Present- Frequency, Nocturia, Painful Urination, Pelvic Pain and Urgency. Musculoskeletal Not Present- Back Pain, Joint Pain, Joint Stiffness, Muscle Pain, Muscle Weakness and Swelling of Extremities. Neurological Not Present- Decreased Memory, Fainting, Headaches, Numbness, Seizures, Tingling, Tremor, Trouble walking and Weakness. Psychiatric Not Present- Anxiety, Bipolar, Change in Sleep Pattern,  Depression, Fearful and Frequent crying. Endocrine Not Present- Cold Intolerance, Excessive Hunger, Hair Changes, Heat Intolerance, Hot flashes and New Diabetes. Hematology Not Present- Blood Thinners, Easy Bruising, Excessive bleeding, Gland problems, HIV and Persistent Infections.  Vitals (April Staton CMA; 02/25/2019 11:33 AM) 02/25/2019 11:32 AM Weight: 287 lb Height: 67in Body Surface Area: 2.36 m Body Mass Index: 44.95 kg/m  Temp.: 97.75F(Oral)  Pulse: 70 (Regular)  P.OX: 94% (Room air) BP: 138/74 (Sitting, Left Arm, Standard)        Physical Exam Leighton Ruff MD; 1/61/0960 12:43 PM)  General Mental Status-Alert. General Appearance-Cooperative.  Head and Neck Head-normocephalic, atraumatic with no lesions or palpable masses. Face Global Assessment - atraumatic. Neck Global Assessment - no abnormal movements. Trachea-midline.  Chest and Lung Exam Chest and lung exam reveals -quiet, even and easy respiratory effort with no use of accessory muscles.  Cardiovascular Cardiovascular examination reveals -normal heart sounds, regular rate and rhythm with no murmurs.  Abdomen Inspection Inspection of the abdomen reveals - Note: CT-guided drain with minimal purulent output. Palpation/Percussion Palpation and Percussion of the abdomen reveal - Soft and Non Tender.    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 A drain was placed. Follow-up in IR reveals that her abscess is getting smaller. She is finishing up her antibiotics. She is having minimal drainage from her drain currently. She will follow up with IR in the next couple weeks. I have recommended a flexible sigmoidoscopy to evaluate the area in approximate 3-4 weeks. I have also recommended that we proceed with  sigmoidectomy in the near future to avoid recurrences. She will be ready for this in mid December. I think she would rather wait to after the holidays to have this completed.  Current Plans

## 2019-02-27 ENCOUNTER — Other Ambulatory Visit: Payer: Medicaid Other

## 2019-03-06 ENCOUNTER — Ambulatory Visit
Admission: RE | Admit: 2019-03-06 | Discharge: 2019-03-06 | Disposition: A | Discharge status: Home / Self Care | Payer: Medicaid Other | Source: Ambulatory Visit | Attending: General Surgery | Admitting: General Surgery

## 2019-03-06 ENCOUNTER — Encounter: Payer: Self-pay | Admitting: Radiology

## 2019-03-06 DIAGNOSIS — K5720 Diverticulitis of large intestine with perforation and abscess without bleeding: Secondary | ICD-10-CM

## 2019-03-06 HISTORY — PX: IR RADIOLOGIST EVAL & MGMT: IMG5224

## 2019-03-06 MED ORDER — IOPAMIDOL (ISOVUE-300) INJECTION 61%
100.0000 mL | Freq: Once | INTRAVENOUS | Status: AC | PRN
  Administered 2019-03-06: 14:00:00 100 mL via INTRAVENOUS

## 2019-03-06 NOTE — Progress Notes (Signed)
Interventional Radiology Progress Note   Robyn [name] is a pleasant 38 yo female who presents to our clinic for an evaluation/management of her drain.   LLQ drain was placed 02/09/2019 for diverticular abscess.   She has managed at home with record of output and daily sterile flush. Currently, there is less than 5cc per day, and she thinks this is all from the flush that she applies. She denies any fever, rigors, chills, or increasing pain.   She will be getting colonoscopy on 03/28/2019, with surgical appointment in December.    CT today shows no abscess. The drain is adjacent to the left ovary/cyst.  A prior fistula was demonstrated on injection, which has resolved on the injection today.   We discussed leaving the drain vs removal, and the very slight chance of recurrent infection.  She voiced her preference for removal.  Drain was removed today without incident.   Signed,  Dulcy Fanny. Earleen Newport, DO

## 2019-05-05 ENCOUNTER — Other Ambulatory Visit (HOSPITAL_COMMUNITY)
Admission: RE | Admit: 2019-05-05 | Discharge: 2019-05-05 | Disposition: A | Discharge status: Home / Self Care | Payer: Medicaid Other | Source: Ambulatory Visit | Attending: General Surgery | Admitting: General Surgery

## 2019-05-05 DIAGNOSIS — Z20828 Contact with and (suspected) exposure to other viral communicable diseases: Secondary | ICD-10-CM | POA: Diagnosis not present

## 2019-05-05 DIAGNOSIS — Z01812 Encounter for preprocedural laboratory examination: Secondary | ICD-10-CM | POA: Insufficient documentation

## 2019-05-06 ENCOUNTER — Encounter (HOSPITAL_COMMUNITY): Payer: Self-pay | Admitting: *Deleted

## 2019-05-06 LAB — NOVEL CORONAVIRUS, NAA (HOSP ORDER, SEND-OUT TO REF LAB; TAT 18-24 HRS): SARS-CoV-2, NAA: NOT DETECTED

## 2019-05-08 ENCOUNTER — Encounter (HOSPITAL_COMMUNITY)
Admission: RE | Disposition: A | Discharge status: Home / Self Care | Payer: Self-pay | Source: Home / Self Care | Attending: General Surgery

## 2019-05-08 ENCOUNTER — Other Ambulatory Visit: Payer: Self-pay

## 2019-05-08 ENCOUNTER — Encounter (HOSPITAL_COMMUNITY): Payer: Self-pay | Admitting: General Surgery

## 2019-05-08 ENCOUNTER — Ambulatory Visit (HOSPITAL_COMMUNITY)
Admission: RE | Admit: 2019-05-08 | Discharge: 2019-05-08 | Disposition: A | Discharge status: Home / Self Care | Payer: Medicaid Other | Attending: General Surgery | Admitting: General Surgery

## 2019-05-08 DIAGNOSIS — Z8719 Personal history of other diseases of the digestive system: Secondary | ICD-10-CM | POA: Insufficient documentation

## 2019-05-08 DIAGNOSIS — Z79899 Other long term (current) drug therapy: Secondary | ICD-10-CM | POA: Insufficient documentation

## 2019-05-08 DIAGNOSIS — Z792 Long term (current) use of antibiotics: Secondary | ICD-10-CM | POA: Insufficient documentation

## 2019-05-08 DIAGNOSIS — Z7984 Long term (current) use of oral hypoglycemic drugs: Secondary | ICD-10-CM | POA: Insufficient documentation

## 2019-05-08 DIAGNOSIS — Z09 Encounter for follow-up examination after completed treatment for conditions other than malignant neoplasm: Secondary | ICD-10-CM | POA: Insufficient documentation

## 2019-05-08 DIAGNOSIS — F17200 Nicotine dependence, unspecified, uncomplicated: Secondary | ICD-10-CM | POA: Insufficient documentation

## 2019-05-08 DIAGNOSIS — E119 Type 2 diabetes mellitus without complications: Secondary | ICD-10-CM | POA: Insufficient documentation

## 2019-05-08 HISTORY — PX: FLEXIBLE SIGMOIDOSCOPY: SHX5431

## 2019-05-08 LAB — GLUCOSE, CAPILLARY: Glucose-Capillary: 135 mg/dL — ABNORMAL HIGH (ref 70–99)

## 2019-05-08 SURGERY — SIGMOIDOSCOPY, FLEXIBLE
Anesthesia: IV Sedation (MBSC Only)

## 2019-05-08 MED ORDER — MIDAZOLAM HCL (PF) 5 MG/ML IJ SOLN
INTRAMUSCULAR | Status: AC
  Filled 2019-05-08: qty 2

## 2019-05-08 MED ORDER — FENTANYL CITRATE (PF) 100 MCG/2ML IJ SOLN
INTRAMUSCULAR | Status: AC
  Filled 2019-05-08: qty 2

## 2019-05-08 MED ORDER — MIDAZOLAM HCL (PF) 5 MG/ML IJ SOLN
INTRAMUSCULAR | Status: DC | PRN
  Administered 2019-05-08: 2 mg via INTRAVENOUS
  Administered 2019-05-08: 1 mg via INTRAVENOUS
  Administered 2019-05-08: 2 mg via INTRAVENOUS

## 2019-05-08 MED ORDER — DIPHENHYDRAMINE HCL 50 MG/ML IJ SOLN
INTRAMUSCULAR | Status: AC
  Filled 2019-05-08: qty 1

## 2019-05-08 MED ORDER — SODIUM CHLORIDE 0.9 % IV SOLN
250.0000 mL | INTRAVENOUS | Status: DC
  Administered 2019-05-08: 12:00:00 250 mL via INTRAVENOUS

## 2019-05-08 MED ORDER — FENTANYL CITRATE (PF) 100 MCG/2ML IJ SOLN
INTRAMUSCULAR | Status: DC | PRN
  Administered 2019-05-08 (×3): 25 ug via INTRAVENOUS

## 2019-05-08 NOTE — Op Note (Signed)
[facility] Patient Name: Robyn RubensteinRasherra [name] Procedure Date: 05/08/2019 MRN: [id number] Attending MD: Romie LeveeAlicia Dior Stepter , MD Date of Birth: 20-Jun-1980 CSN: [id number] Age: [age] Admit Type: Outpatient Procedure:                Flexible Sigmoidoscopy Indications:              Follow-up of diverticulitis Providers:                Romie LeveeAlicia Tayna Smethurst, MD, Tillie Fantasiaonna Pickering, RN, Harrington ChallengerHope                            Parker, Technician Referring MD:              Medicines:                Fentanyl 75 micrograms IV, Midazolam 5 mg IV, Complications:            No immediate complications. Estimated Blood Loss:     Estimated blood loss: none. Procedure:                Pre-Anesthesia Assessment:                           - Prior to the procedure, a History and Physical                            was performed, and patient medications and                            allergies were reviewed. The patient's tolerance of                            previous anesthesia was also reviewed. The risks                            and benefits of the procedure and the sedation                            options and risks were discussed with the patient.                            All questions were answered, and informed consent                            was obtained. Prior Anticoagulants: The patient has                            taken no previous anticoagulant or antiplatelet                            agents. ASA Grade Assessment: III - A patient with                            severe systemic disease. After reviewing the risks  and benefits, the patient was deemed in                            satisfactory condition to undergo the procedure.                           After obtaining informed consent, the scope was                            passed under direct vision. The CF-[id number] ([id number])                            Olympus colonoscope was introduced through the anus                and advanced to the the descending colon. The                            flexible sigmoidoscopy was accomplished without                            difficulty. The patient tolerated the procedure                            well. The quality of the bowel preparation was                            adequate to identify polyps. Scope In: 1:27:03 PM Scope Out: 1:32:18 PM Total Procedure Duration: 0 hours 5 minutes 15 seconds  Findings:      The entire examined colon appeared normal. There were no diverticuli       seen on exam Impression:               - The entire examined colon is normal.                           - No specimens collected. Moderate Sedation:      Moderate (conscious) sedation was administered by the endoscopy nurse       and supervised by the endoscopist. The following parameters were       monitored: oxygen saturation, heart rate, blood pressure, respiratory       rate, EKG, adequacy of pulmonary ventilation, and response to care. Recommendation:           - Discharge patient to home (ambulatory).                           - Written discharge instructions were provided to                            the patient.                           - High fiber diet. Procedure Code(s):        --- Professional ---                           [phone], Sigmoidoscopy,  flexible; diagnostic,                            including collection of specimen(s) by brushing or                            washing, when performed (separate procedure) Diagnosis Code(s):        --- Professional ---                           Z02.58, Diverticulitis of large intestine without                            perforation or abscess without bleeding CPT copyright 2019 American Medical Association. All rights reserved. The codes documented in this report are preliminary and upon coder review may  be revised to meet current compliance requirements. Romie Levee, MD Romie Levee, MD 05/08/2019 1:43:13  PM This report has been signed electronically. Number of Addenda: 0

## 2019-05-08 NOTE — Discharge Instructions (Signed)
Flexible Sigmoidoscopy, Care After  This sheet gives you information about how to care for yourself after your procedure. Your health care provider may also give you more specific instructions. If you have problems or questions, contact your health care provider.  What can I expect after the procedure?  After the procedure, it is common to have:  · Abdominal cramping or pain.  · Bloating.  · A small amount of rectal bleeding if you had a biopsy.  Follow these instructions at home:  · Take over-the-counter and prescription medicines only as told by your health care provider.  · Do not drive for 24 hours if you received a medicine to help you relax (sedative).  · Keep all follow-up visits as told by your health care provider. This is important.  Contact a health care provider if:  · You have abdominal pain or cramping that gets worse or is not helped with medicine.  · You continue to have small amounts of rectal bleeding after 24 hours.  · You have nausea or vomiting.  · You feel weak or dizzy.  · You have a fever.  Get help right away if:  · You pass large blood clots or see a large amount of blood in the toilet after having a bowel movement.  · You have nausea or vomiting for more than 24 hours after the procedure.  This information is not intended to replace advice given to you by your health care provider. Make sure you discuss any questions you have with your health care provider.  Document Released: 05/20/2013 Document Revised: 01/06/2016 Document Reviewed: 08/14/2015  Elsevier Patient Education © 2020 [employer]

## 2019-05-08 NOTE — H&P (Signed)
The patient is a 38 year old female who presents with diverticulitis. 38 year old female with a history of diabetes and morbid obesity who presented to [facility] [place] with worsening left lower quadrant pain over the last few weeks. CT scan showed left-sided diverticulitis with associated phlegmon and possible involvement of the left ovary. Patient was transferred to [facility] long and placed on IV fluids and IV antibiotics. She underwent a CT-guided drain placement on February 09, 2019. Follow-up CT scan shows decrease in abscess cavity. She is here today flex sigmoidoscopy.   Past Surgical History (April Staton, [place]; 02/25/2019 11:30 AM) Cesarean Section - Multiple  Diagnostic Studies History (April Staton, [place]; 02/25/2019 11:30 AM) Colonoscopy never Mammogram never Pap Smear 1-5 years ago  Allergies (April Staton, CMA; 02/25/2019 11:30 AM) No Known Drug Allergies [02/25/2019]:  Medication History (April Staton, CMA; 02/25/2019 11:32 AM) Amoxicillin-Pot Clavulanate (875-125MG  Tablet, Oral) Active. Atorvastatin Calcium (80MG  Tablet, Oral) Active. glipiZIDE (5MG  Tablet, Oral) Active. OneTouch Ultra (In Vitro) Active. hydroCHLOROthiazide (25MG  Tablet, Oral) Active. HYDROcodone-Acetaminophen (5-325MG  Tablet, Oral) Active. Anusol-HC (2.5% Cream, External) Active. Losartan Potassium (50MG  Tablet, Oral) Active. metFORMIN HCl ER (500MG  Tablet ER 24HR, Oral) Active. Ondansetron (4MG  Tablet Disint, Oral) Active. Saccharomyces boulardii (250MG  Capsule, Oral) Active. Medications Reconciled  Social History (April Staton, CMA; 02/25/2019 11:30 AM) Alcohol use Occasional alcohol use. Caffeine use Coffee. No drug use Tobacco use Current every day smoker.  Family History (April Staton, [place]; 02/25/2019 11:30 AM) Diabetes Mellitus Father. Hypertension Brother, Father, Mother.  Pregnancy / Birth History (April Staton, [place]; 02/25/2019 11:30 AM) Age at menarche  2 years. Contraceptive History Contraceptive implant. Gravida 5 Length (months) of breastfeeding 3-6 Maternal age 60-20 Para 3 Regular periods  Other Problems (April Staton, CMA; 02/25/2019 11:30 AM) Diabetes Mellitus High blood pressure     Review of Systems  General Not Present- Appetite Loss, Chills, Fatigue, Fever, Night Sweats, Weight Gain and Weight Loss. Skin Not Present- Change in Wart/Mole, Dryness, Hives, Jaundice, New Lesions, Non-Healing Wounds, Rash and Ulcer. HEENT Not Present- Earache, Hearing Loss, Hoarseness, Nose Bleed, Oral Ulcers, Ringing in the Ears, Seasonal Allergies, Sinus Pain, Sore Throat, Visual Disturbances, Wears glasses/contact lenses and Yellow Eyes. Respiratory Not Present- Bloody sputum, Chronic Cough, Difficulty Breathing, Snoring and Wheezing. Breast Not Present- Breast Mass, Breast Pain, Nipple Discharge and Skin Changes. Cardiovascular Not Present- Chest Pain, Difficulty Breathing Lying Down, Leg Cramps, Palpitations, Rapid Heart Rate, Shortness of Breath and Swelling of Extremities. Gastrointestinal Not Present- Abdominal Pain, Bloating, Bloody Stool, Change in Bowel Habits, Chronic diarrhea, Constipation, Difficulty Swallowing, Excessive gas, Gets full quickly at meals, Hemorrhoids, Indigestion, Nausea, Rectal Pain and Vomiting. Female Genitourinary Not Present- Frequency, Nocturia, Painful Urination, Pelvic Pain and Urgency. Musculoskeletal Not Present- Back Pain, Joint Pain, Joint Stiffness, Muscle Pain, Muscle Weakness and Swelling of Extremities. Neurological Not Present- Decreased Memory, Fainting, Headaches, Numbness, Seizures, Tingling, Tremor, Trouble walking and Weakness. Psychiatric Not Present- Anxiety, Bipolar, Change in Sleep Pattern, Depression, Fearful and Frequent crying. Endocrine Not Present- Cold Intolerance, Excessive Hunger, Hair Changes, Heat Intolerance, Hot flashes and New Diabetes. Hematology Not Present- Blood  Thinners, Easy Bruising, Excessive bleeding, Gland problems, HIV and Persistent Infections.  BP (!) 149/87   Pulse 75   Temp 98.9 F (37.2 C) (Oral)   Resp 20   Ht 5\' 8"  (1.727 m)   LMP 04/15/2019 (Approximate) Comment: pt states no chance pregnant has arm homronal birth control  SpO2 97%   BMI 43.97 kg/m     Physical Exam  General Mental Status-Alert. General Appearance-Cooperative.  Head and Neck Head-normocephalic, atraumatic with no lesions or palpable masses. Face Global Assessment - atraumatic. Neck Global Assessment - no abnormal movements. Trachea-midline.  Chest and Lung Exam Chest and lung exam reveals -quiet, even and easy respiratory effort with no use of accessory muscles.  Cardiovascular Cardiovascular examination reveals -normal heart sounds, regular rate and rhythm with no murmurs.  Abdomen Inspection Inspection of the abdomen reveals - Note: CT-guided drain with minimal purulent output. Palpation/Percussion Palpation and Percussion of the abdomen reveal - Soft and Non Tender.    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 A drain was placed.  Her drain has been removed.  I have also recommended that we proceed with sigmoidectomy in the near future to avoid recurrences.  I have recommended flex sig to eval for any malignancy in the area.  Risks include bleeding, inability to complete the procedure and a small risk of perforation.  I believe she understands this and is willing to proceed.

## 2019-05-09 ENCOUNTER — Encounter: Payer: Self-pay | Admitting: *Deleted

## 2019-08-02 ENCOUNTER — Emergency Department (HOSPITAL_COMMUNITY)
Admission: EM | Admit: 2019-08-02 | Discharge: 2019-08-02 | Disposition: A | Discharge status: Home / Self Care | Payer: Medicaid Other | Attending: Emergency Medicine | Admitting: Emergency Medicine

## 2019-08-02 ENCOUNTER — Emergency Department (HOSPITAL_COMMUNITY): Payer: Medicaid Other

## 2019-08-02 ENCOUNTER — Encounter (HOSPITAL_COMMUNITY): Payer: Self-pay

## 2019-08-02 ENCOUNTER — Other Ambulatory Visit: Payer: Self-pay

## 2019-08-02 DIAGNOSIS — E119 Type 2 diabetes mellitus without complications: Secondary | ICD-10-CM | POA: Insufficient documentation

## 2019-08-02 DIAGNOSIS — N939 Abnormal uterine and vaginal bleeding, unspecified: Secondary | ICD-10-CM | POA: Diagnosis not present

## 2019-08-02 DIAGNOSIS — R110 Nausea: Secondary | ICD-10-CM | POA: Insufficient documentation

## 2019-08-02 DIAGNOSIS — R6883 Chills (without fever): Secondary | ICD-10-CM | POA: Insufficient documentation

## 2019-08-02 DIAGNOSIS — N898 Other specified noninflammatory disorders of vagina: Secondary | ICD-10-CM | POA: Insufficient documentation

## 2019-08-02 DIAGNOSIS — R1032 Left lower quadrant pain: Secondary | ICD-10-CM | POA: Diagnosis not present

## 2019-08-02 DIAGNOSIS — F17210 Nicotine dependence, cigarettes, uncomplicated: Secondary | ICD-10-CM | POA: Diagnosis not present

## 2019-08-02 DIAGNOSIS — Z79899 Other long term (current) drug therapy: Secondary | ICD-10-CM | POA: Diagnosis not present

## 2019-08-02 DIAGNOSIS — Z7984 Long term (current) use of oral hypoglycemic drugs: Secondary | ICD-10-CM | POA: Insufficient documentation

## 2019-08-02 DIAGNOSIS — I10 Essential (primary) hypertension: Secondary | ICD-10-CM | POA: Insufficient documentation

## 2019-08-02 LAB — I-STAT BETA HCG BLOOD, ED (MC, WL, AP ONLY): I-stat hCG, quantitative: <5 m[IU]/mL (ref <5)

## 2019-08-02 LAB — CBC
HCT: 38.8 % (ref 36.0–46.0)
Hemoglobin: 12.4 g/dL (ref 12.0–15.0)
MCH: 27.8 pg (ref 26.0–34.0)
MCHC: 32 g/dL (ref 30.0–36.0)
MCV: 87 fL (ref 80.0–100.0)
Platelets: 184 10*3/uL (ref 150–400)
RBC: 4.46 MIL/uL (ref 3.87–5.11)
RDW: 15.3 % (ref 11.5–15.5)
WBC: 9.1 10*3/uL (ref 4.0–10.5)
nRBC: 0 % (ref 0.0–0.2)

## 2019-08-02 LAB — COMPREHENSIVE METABOLIC PANEL
ALT: 21 U/L (ref 0–44)
AST: 18 U/L (ref 15–41)
Albumin: 3.8 g/dL (ref 3.5–5.0)
Alkaline Phosphatase: 64 U/L (ref 38–126)
Anion gap: 9 (ref 5–15)
BUN: 14 mg/dL (ref 6–20)
CO2: 24 mmol/L (ref 22–32)
Calcium: 8.6 mg/dL — ABNORMAL LOW (ref 8.9–10.3)
Chloride: 102 mmol/L (ref 98–111)
Creatinine, Ser: 0.67 mg/dL (ref 0.44–1.00)
GFR calc Af Amer: >60 mL/min (ref >60)
GFR calc non Af Amer: >60 mL/min (ref >60)
Glucose, Bld: 345 mg/dL — ABNORMAL HIGH (ref 70–99)
Potassium: 3.2 mmol/L — ABNORMAL LOW (ref 3.5–5.1)
Sodium: 135 mmol/L (ref 135–145)
Total Bilirubin: 0.8 mg/dL (ref 0.3–1.2)
Total Protein: 7.5 g/dL (ref 6.5–8.1)

## 2019-08-02 LAB — URINALYSIS, ROUTINE W REFLEX MICROSCOPIC
Bacteria, UA: NONE SEEN
Bilirubin Urine: NEGATIVE
Glucose, UA: >=500 mg/dL — AB
Ketones, ur: 20 mg/dL — AB
Nitrite: NEGATIVE
Protein, ur: 100 mg/dL — AB
RBC / HPF: >50 RBC/hpf — ABNORMAL HIGH (ref 0–5)
Specific Gravity, Urine: 1.033 — ABNORMAL HIGH (ref 1.005–1.030)
pH: 5 (ref 5.0–8.0)

## 2019-08-02 LAB — WET PREP, GENITAL
Sperm: NONE SEEN
Trich, Wet Prep: NONE SEEN
Yeast Wet Prep HPF POC: NONE SEEN

## 2019-08-02 LAB — LIPASE, BLOOD: Lipase: 18 U/L (ref 11–51)

## 2019-08-02 MED ORDER — OXYCODONE-ACETAMINOPHEN 5-325 MG PO TABS: 1.0000 | ORAL_TABLET | ORAL | 0 refills | Status: DC | PRN

## 2019-08-02 MED ORDER — SODIUM CHLORIDE 0.9% FLUSH
3.0000 mL | Freq: Once | INTRAVENOUS | Status: AC
  Administered 2019-08-02: 3 mL via INTRAVENOUS

## 2019-08-02 MED ORDER — SODIUM CHLORIDE (PF) 0.9 % IJ SOLN
INTRAMUSCULAR | Status: AC
  Administered 2019-08-02: 10 mL
  Filled 2019-08-02: qty 50

## 2019-08-02 MED ORDER — IOHEXOL 300 MG/ML  SOLN
100.0000 mL | Freq: Once | INTRAMUSCULAR | Status: AC | PRN
  Administered 2019-08-02: 100 mL via INTRAVENOUS

## 2019-08-02 MED ORDER — HYDROMORPHONE HCL 1 MG/ML IJ SOLN
1.0000 mg | Freq: Once | INTRAMUSCULAR | Status: AC
  Administered 2019-08-02: 1 mg via INTRAVENOUS
  Filled 2019-08-02: qty 1

## 2019-08-02 MED ORDER — HYDROMORPHONE HCL 2 MG/ML IJ SOLN
2.0000 mg | Freq: Once | INTRAMUSCULAR | Status: AC
  Administered 2019-08-02: 2 mg via INTRAVENOUS
  Filled 2019-08-02: qty 1

## 2019-08-02 MED ORDER — AMOXICILLIN-POT CLAVULANATE 875-125 MG PO TABS: 1.0000 | ORAL_TABLET | Freq: Two times a day (BID) | ORAL | 0 refills | Status: DC

## 2019-08-02 MED ORDER — KETOROLAC TROMETHAMINE 15 MG/ML IJ SOLN
15.0000 mg | Freq: Once | INTRAMUSCULAR | Status: AC
  Administered 2019-08-02: 15 mg via INTRAVENOUS
  Filled 2019-08-02: qty 1

## 2019-08-02 NOTE — ED Notes (Signed)
Ultrasound at bedside

## 2019-08-02 NOTE — Discharge Instructions (Addendum)
Take Percocet as needed for severe pain Take Ibuprofen 600mg  every 6-8 hours as needed Take Augmentin twice daily for one week Please follow up with your OBGYN doctor Return if worsening

## 2019-08-02 NOTE — ED Provider Notes (Signed)
[facility]-EMERGENCY DEPT Provider Note   CSN: [id number] Arrival date & time: 08/02/19  1055   History Chief Complaint  Patient presents with  . Abdominal Pain  . Nausea    Robyn [name] is a 39 y.o. female with history of colonic diverticular abscess involving the left ovary, obesity, insulin dependent DM, PCOS who presents with abdominal pain. She states that over the past 3 days she has had a constant, gradually worsening LLQ abdominal pain. She states it feels like when she had the diverticular abscess. She reports associated chills and nausea. She has had more trouble having a BM and it comes out in small pieces. She denies fever, vomiting, urinary symptoms. She has had some mucous like vaginal discharge but states she's unsure if this is related to started her period or not. Past surigcal hx significant for multiple C-sections. She has PCOS and if she has a cyst her symptoms will improve when she takes Motrin but it has not. She had sigmoidoscopy in Dec 2020 which was completely normal.  HPI     Past Medical History:  Diagnosis Date  . Anxiety    h/o panic attacks  . Diabetes mellitus without complication (HCC)    diet controlled   . Diverticulosis 02/16/2019  . Herpes   . Hypertension   . Left ovarian cyst 11/29/2011  . PCOS (polycystic ovarian syndrome)   . Pregnancy induced hypertension   . Shortness of breath dyspnea    with exertion and while pregnant  . Smoker 11/29/2011    Patient Active Problem List   Diagnosis Date Noted  . Colonic diverticular abscess 02/10/2019  . Diverticulitis 02/09/2019  . S/P repeat low transverse C-section 03/23/2015  . Hypertension - on Labetalol 200 mg po bid 11/22/2014  . Diabetes mellitus type 2, uncontrolled, without complications -- dx'd in 2011 11/22/2014  . Hx of incompetent cervix, currently pregnant - cerclage placed on 10/09/14 by Dr. Su Hilt 11/22/2014  . History of preterm delivery x 2 - weekly 17P  injections - first injection at 16.5 wks 11/22/2014  . Elderly multigravida 11/22/2014  . PCOS (polycystic ovarian syndrome) 08/21/2014  . Herpes 08/21/2014  . ANA positive 08/21/2014  . Previous cesarean delivery, antepartum condition or complication x 2--previous vertical incision 08/21/2014  . Obesity 05/20/2012    Past Surgical History:  Procedure Laterality Date  . CERVICAL CERCLAGE N/A 10/09/2014   Procedure: CERCLAGE CERVICAL;  Surgeon: Osborn Coho, MD;  Location: [facility];  Service: Gynecology;  Laterality: N/A;  . CESAREAN SECTION     C/S x 2  . CESAREAN SECTION WITH BILATERAL TUBAL LIGATION Bilateral 03/23/2015   Procedure: CESAREAN SECTION WITH BILATERAL TUBAL LIGATION;  Surgeon: Osborn Coho, MD;  Location: [facility];  Service: Obstetrics;  Laterality: Bilateral;  Amt OK'd to move 02/25/2015  . FLEXIBLE SIGMOIDOSCOPY N/A 05/08/2019   Procedure: FLEXIBLE SIGMOIDOSCOPY;  Surgeon: Romie Levee, MD;  Location: [facility];  Service: Endoscopy;  Laterality: N/A;  . IR RADIOLOGIST EVAL & MGMT  02/20/2019  . IR RADIOLOGIST EVAL & MGMT  03/06/2019  . WISDOM TOOTH EXTRACTION       OB History    Gravida  5   Para  3   Term      Preterm  3   AB  2   Living  3     SAB  1   TAB  1   Ectopic      Multiple  0   Live Births  3  Family History  Problem Relation Age of Onset  . Hypertension Mother   . Diabetes Father   . Stroke Father   . Cancer Maternal Grandmother   . Cancer Maternal Uncle     Social History   Tobacco Use  . Smoking status: Current Every Day Smoker    Packs/day: 0.25    Years: 13.00    Pack years: 3.25    Types: Cigarettes  . Smokeless tobacco: Never Used  Substance Use Topics  . Alcohol use: Yes    Comment: socially and rarely  . Drug use: No    Home Medications Prior to Admission medications   Medication Sig Start Date End Date Taking? Authorizing Provider  glipiZIDE (GLUCOTROL) 5 MG tablet Take 5 mg by mouth 2  (two) times daily. 01/21/19   [provider]  glucose blood (ONETOUCH VERIO) test strip 1 each by Other route 4 (four) times daily -  before meals and at bedtime. And lancets 4/day 01/13/15   Romero Belling, MD  hydrochlorothiazide (HYDRODIURIL) 25 MG tablet Take 25 mg by mouth daily. 01/24/19   [provider]  losartan (COZAAR) 50 MG tablet Take 50 mg by mouth daily. 01/24/19   [provider]  metFORMIN (GLUCOPHAGE-XR) 500 MG 24 hr tablet Take 1,000 mg by mouth 2 (two) times daily. 12/23/18   [provider]    Allergies    Patient has no known allergies.  Review of Systems   Review of Systems  Constitutional: Positive for chills. Negative for fever.  Respiratory: Negative for shortness of breath.   Cardiovascular: Negative for chest pain.  Gastrointestinal: Positive for abdominal pain, constipation and nausea. Negative for diarrhea and vomiting.  Genitourinary: Positive for vaginal bleeding and vaginal discharge. Negative for difficulty urinating and dysuria.  All other systems reviewed and are negative.   Physical Exam Updated Vital Signs BP 140/84 (BP Location: Left Arm)   Pulse 93   Temp 98.7 F (37.1 C) (Oral)   Resp 18   Ht 5\' 8"  (1.727 m)   Wt 133.8 kg   LMP 08/02/2019   SpO2 95%   BMI 44.85 kg/m   Physical Exam Vitals and nursing note reviewed.  Constitutional:      General: She is not in acute distress.    Appearance: She is well-developed. She is obese. She is not ill-appearing.  HENT:     Head: Normocephalic and atraumatic.  Eyes:     General: No scleral icterus.       Right eye: No discharge.        Left eye: No discharge.     Conjunctiva/sclera: Conjunctivae normal.     Pupils: Pupils are equal, round, and reactive to light.  Cardiovascular:     Rate and Rhythm: Normal rate.  Pulmonary:     Effort: Pulmonary effort is normal. No respiratory distress.  Abdominal:     General: Abdomen is protuberant. Bowel sounds are  normal. There is no distension.     Palpations: Abdomen is soft.     Tenderness: There is abdominal tenderness (with deep palpation) in the left lower quadrant.     Hernia: No hernia is present.  Genitourinary:    Comments: Pelvic: No inguinal lymphadenopathy or inguinal hernia noted. Normal external genitalia. No pain with speculum insertion. Closed cervical os with normal appearance - no rash or lesions. Moderate bleeding in vaginal vault. On bimanual examination no adnexal tenderness or cervical motion tenderness. Chaperone present during exam.   Musculoskeletal:  Cervical back: Normal range of motion.  Skin:    General: Skin is warm and dry.  Neurological:     Mental Status: She is alert and oriented to person, place, and time.  Psychiatric:        Behavior: Behavior normal.     ED Results / Procedures / Treatments   Labs (all labs ordered are listed, but only abnormal results are displayed) Labs Reviewed  COMPREHENSIVE METABOLIC PANEL - Abnormal; Notable for the following components:      Result Value   Potassium 3.2 (*)    Glucose, Bld 345 (*)    Calcium 8.6 (*)    All other components within normal limits  URINALYSIS, ROUTINE W REFLEX MICROSCOPIC - Abnormal; Notable for the following components:   Color, Urine RED (*)    APPearance CLOUDY (*)    Specific Gravity, Urine 1.033 (*)    Glucose, UA >=500 (*)    Hgb urine dipstick LARGE (*)    Ketones, ur 20 (*)    Protein, ur 100 (*)    Leukocytes,Ua TRACE (*)    RBC / HPF >50 (*)    All other components within normal limits  WET PREP, GENITAL  LIPASE, BLOOD  CBC  I-STAT BETA HCG BLOOD, ED ([facility], [facility], [facility] ONLY)  GC/CHLAMYDIA PROBE AMP ([place]) NOT AT [facility]    EKG None  Radiology US Transvaginal Non-OB  Result Date: 08/02/2019 CLINICAL DATA:  Left abdominal pain and nausea for the past 3 days. 5 cm complex cystic mass in the region of the left ovary on an abdomen and pelvis CT obtained earlier today. EXAM:  TRANSABDOMINAL AND TRANSVAGINAL ULTRASOUND OF PELVIS DOPPLER ULTRASOUND OF OVARIES TECHNIQUE: Both transabdominal and transvaginal ultrasound examinations of the pelvis were performed. Transabdominal technique was performed for global imaging of the pelvis including uterus, ovaries, adnexal regions, and pelvic cul-de-sac. It was necessary to proceed with endovaginal exam following the transabdominal exam to visualize the uterus, endometrium and ovaries in better detail. Color and duplex Doppler ultrasound was utilized to evaluate blood flow to the ovaries. COMPARISON:  Abdomen and pelvis CT obtained earlier today. Abdomen pelvis CTs dated 02/09/2019 and 02/02/2019. Pelvic ultrasound dated 02/01/2019 and 11/29/2011. FINDINGS: Uterus Measurements: 12.2 x 6.2 x 4.6 cm = volume: 184 mL. No fibroids or other mass visualized. Endometrium Thickness: 2.5 mm.  No focal abnormality visualized. Right ovary Measurements: 4.3 x 3.5 x 2.7 cm = volume: 21 mL. 3.0 cm poorly defined cyst with no visible internal septations or nodularity. Left ovary Measurements: 5.6 x 5.3 x 5.2 cm = volume: 82 mL. Multiple small, poorly defined follicles, similar to the appearance seen on 02/01/2019. Pulsed Doppler evaluation of both ovaries demonstrates normal low-resistance arterial and venous waveforms. Other findings No abnormal free fluid. The examination is limited by patient body habitus, pelvic bowel loops and the fact that the patient was in pain during the examination. IMPRESSION: 1. No acute abnormality. 2. 3.0 cm grossly simple appearing right ovarian cyst. This is almost certainly benign, and no specific imaging follow up is recommended according to the Society of Radiologists in Ultrasound 2010 consensus Conference Statement (D Lenis Noon et al. Management of Asymptomatic Ovarian and Other Adnexal Cysts Imaged at [place]: Society of Radiologists in Ultrasound Consensus Conference Statement 2010. Radiology 256 (Sept 2010): 943-954.). 3.  Chronically enlarged left ovary containing multiple follicles without significant change and with no evidence of torsion. Electronically Signed   By: Beckie Salts M.D.   On: 08/02/2019 15:56  US Pelvis Complete  Result Date: 08/02/2019 CLINICAL DATA:  Left abdominal pain and nausea for the past 3 days. 5 cm complex cystic mass in the region of the left ovary on an abdomen and pelvis CT obtained earlier today. EXAM: TRANSABDOMINAL AND TRANSVAGINAL ULTRASOUND OF PELVIS DOPPLER ULTRASOUND OF OVARIES TECHNIQUE: Both transabdominal and transvaginal ultrasound examinations of the pelvis were performed. Transabdominal technique was performed for global imaging of the pelvis including uterus, ovaries, adnexal regions, and pelvic cul-de-sac. It was necessary to proceed with endovaginal exam following the transabdominal exam to visualize the uterus, endometrium and ovaries in better detail. Color and duplex Doppler ultrasound was utilized to evaluate blood flow to the ovaries. COMPARISON:  Abdomen and pelvis CT obtained earlier today. Abdomen pelvis CTs dated 02/09/2019 and 02/02/2019. Pelvic ultrasound dated 02/01/2019 and 11/29/2011. FINDINGS: Uterus Measurements: 12.2 x 6.2 x 4.6 cm = volume: 184 mL. No fibroids or other mass visualized. Endometrium Thickness: 2.5 mm.  No focal abnormality visualized. Right ovary Measurements: 4.3 x 3.5 x 2.7 cm = volume: 21 mL. 3.0 cm poorly defined cyst with no visible internal septations or nodularity. Left ovary Measurements: 5.6 x 5.3 x 5.2 cm = volume: 82 mL. Multiple small, poorly defined follicles, similar to the appearance seen on 02/01/2019. Pulsed Doppler evaluation of both ovaries demonstrates normal low-resistance arterial and venous waveforms. Other findings No abnormal free fluid. The examination is limited by patient body habitus, pelvic bowel loops and the fact that the patient was in pain during the examination. IMPRESSION: 1. No acute abnormality. 2. 3.0 cm grossly  simple appearing right ovarian cyst. This is almost certainly benign, and no specific imaging follow up is recommended according to the Society of Radiologists in Ultrasound 2010 consensus Conference Statement (D Clovis Riley et al. Management of Asymptomatic Ovarian and Other Adnexal Cysts Imaged at [place]: Society of Radiologists in [place] Statement 2010. Radiology 256 (Sept 2010): 943-954.). 3. Chronically enlarged left ovary containing multiple follicles without significant change and with no evidence of torsion. Electronically Signed   By: Claudie Revering M.D.   On: 08/02/2019 15:56   CT Abdomen Pelvis W Contrast  Result Date: 08/02/2019 CLINICAL DATA:  Patient c/o left abdominal pain and nausea x 3 days. Concern for abscess or infection. EXAM: CT ABDOMEN AND PELVIS WITH CONTRAST TECHNIQUE: Multidetector CT imaging of the abdomen and pelvis was performed using the standard protocol following bolus administration of intravenous contrast. CONTRAST:  140mL OMNIPAQUE IOHEXOL 300 MG/ML  SOLN COMPARISON:  CT abdomen pelvis 03/06/2019 FINDINGS: Lower chest: No acute abnormality. Hepatobiliary: No focal liver abnormality is seen. No gallstones, gallbladder wall thickening, or biliary dilatation. Pancreas: Unremarkable. No pancreatic ductal dilatation or surrounding inflammatory changes. Spleen: Normal in size without focal abnormality. Adrenals/Urinary Tract: Adrenal glands are unremarkable. Kidneys are normal, without renal calculi, focal lesion, or hydronephrosis. Bladder is unremarkable. Stomach/Bowel: Stomach is within normal limits. Appendix appears normal. No evidence of bowel wall thickening, distention, or inflammatory changes. Vascular/Lymphatic: No significant vascular findings are present. No enlarged abdominal or pelvic lymph nodes. Reproductive: There is a complex cystic mass in the region of the left ovary measuring 5.0 x 4.7 x 4.3 cm (series 3, image 70) which is in the location of the  patient's previously seen diverticular abscess involving the left ovary. There is mild adjacent stranding. This area previously measured up to 5.8 cm in October 2020. The adjacent bowel does not appear to be significantly involved. Other: No abdominal wall hernia or abnormality. No abdominopelvic ascites.  Musculoskeletal: No acute or significant osseous findings. IMPRESSION: Complex cystic mass in the region of the left ovary measuring 5.0 x 4.7 x 4.3 cm, in the location of the patient's previously seen diverticular abscess. This area previously measured up to 5.8 cm in October 2020. The adjacent bowel does not appear to be significantly involved. Further characterization with pelvic ultrasound may be helpful. Electronically Signed   By: Emmaline KluverNancy  Ballantyne M.D.   On: 08/02/2019 13:12   US Art/Ven Flow Abd Pelv Doppler  Result Date: 08/02/2019 CLINICAL DATA:  Left abdominal pain and nausea for the past 3 days. 5 cm complex cystic mass in the region of the left ovary on an abdomen and pelvis CT obtained earlier today. EXAM: TRANSABDOMINAL AND TRANSVAGINAL ULTRASOUND OF PELVIS DOPPLER ULTRASOUND OF OVARIES TECHNIQUE: Both transabdominal and transvaginal ultrasound examinations of the pelvis were performed. Transabdominal technique was performed for global imaging of the pelvis including uterus, ovaries, adnexal regions, and pelvic cul-de-sac. It was necessary to proceed with endovaginal exam following the transabdominal exam to visualize the uterus, endometrium and ovaries in better detail. Color and duplex Doppler ultrasound was utilized to evaluate blood flow to the ovaries. COMPARISON:  Abdomen and pelvis CT obtained earlier today. Abdomen pelvis CTs dated 02/09/2019 and 02/02/2019. Pelvic ultrasound dated 02/01/2019 and 11/29/2011. FINDINGS: Uterus Measurements: 12.2 x 6.2 x 4.6 cm = volume: 184 mL. No fibroids or other mass visualized. Endometrium Thickness: 2.5 mm.  No focal abnormality visualized. Right ovary  Measurements: 4.3 x 3.5 x 2.7 cm = volume: 21 mL. 3.0 cm poorly defined cyst with no visible internal septations or nodularity. Left ovary Measurements: 5.6 x 5.3 x 5.2 cm = volume: 82 mL. Multiple small, poorly defined follicles, similar to the appearance seen on 02/01/2019. Pulsed Doppler evaluation of both ovaries demonstrates normal low-resistance arterial and venous waveforms. Other findings No abnormal free fluid. The examination is limited by patient body habitus, pelvic bowel loops and the fact that the patient was in pain during the examination. IMPRESSION: 1. No acute abnormality. 2. 3.0 cm grossly simple appearing right ovarian cyst. This is almost certainly benign, and no specific imaging follow up is recommended according to the Society of Radiologists in Ultrasound 2010 consensus Conference Statement (D Lenis NoonLevine et al. Management of Asymptomatic Ovarian and Other Adnexal Cysts Imaged at [place]: Society of Radiologists in Ultrasound Consensus Conference Statement 2010. Radiology 256 (Sept 2010): 943-954.). 3. Chronically enlarged left ovary containing multiple follicles without significant change and with no evidence of torsion. Electronically Signed   By: Beckie SaltsSteven  Reid M.D.   On: 08/02/2019 15:56    Procedures Procedures (including critical care time)  Medications Ordered in ED Medications  sodium chloride flush (NS) 0.9 % injection 3 mL (3 mLs Intravenous Given 08/02/19 1219)  HYDROmorphone (DILAUDID) injection 1 mg (1 mg Intravenous Given 08/02/19 1219)  iohexol (OMNIPAQUE) 300 MG/ML solution 100 mL (100 mLs Intravenous Contrast Given 08/02/19 1247)  sodium chloride (PF) 0.9 % injection (10 mLs  Given 08/02/19 1409)  HYDROmorphone (DILAUDID) injection 2 mg (2 mg Intravenous Given 08/02/19 1408)  ketorolac (TORADOL) 15 MG/ML injection 15 mg (15 mg Intravenous Given 08/02/19 1408)    ED Course  I have reviewed the triage vital signs and the nursing notes.  Pertinent labs & imaging results that were  available during my care of the patient were reviewed by me and considered in my medical decision making (see chart for details).  39 year old female presents with acute LLQ abdominal pain. Symptoms  are similar as to when she had a diverticular abscess last fall. Her vitals are normal here. She has LLQ tenderness with deep palpation. Pelvic exam shows vaginal bleeding. Will obtain labs, preg test, CT abdomen/pelvis. Will give pain control.  CBC is normal. CMP is remarkable for hyperglycemia (345), low K (3.2). Will give 1L fluids.  CT shows complex cystic mass on the left ovary with adjacent stranding. Bowel doesn't seem to be involved. Will discuss with OBGYN.  1:43 PM Rechecked pt. She is distressed, in pain, having chills. Temp checked is 99.9. She sees Dr. Su Hilt with OBGYN  2:11 PM Discussed with Dr. Dion Body with OBGYN - she recommends getting an [place] to r/o torsion. If negative and pain is still intractable would admit for pain control and treatment of suspected TOA.  [place] is reassuring. Discussed with Dr. Dion Body again and she recommends discussing with general surgery because it was recommended she may need a sigmoidectomy. On chart review this was before the flex sig was done which was normal. I don't feel like she needs a surgical consult today. This was discussed with Dr. Charm Barges who is in agreement. When I talked with the patient she feels much better. I am concerned about a possible GI vs GU infection since this is a very similar presentation as last time and temp was elevated here. Will d/c with pain meds and Augmentin. She will f/u with her OBGYN next week.   MDM Rules/Calculators/A&P                       Final Clinical Impression(s) / ED Diagnoses Final diagnoses:  LLQ pain    Rx / DC Orders ED Discharge Orders    None       Bethel Born, PA-C 08/02/19 1704    Terrilee Files, MD 08/03/19 1031

## 2019-08-02 NOTE — ED Triage Notes (Signed)
Patient c/o left abdominal pain and nausea x 3 days.

## 2019-08-05 ENCOUNTER — Inpatient Hospital Stay (HOSPITAL_BASED_OUTPATIENT_CLINIC_OR_DEPARTMENT_OTHER)
Admission: EM | Admit: 2019-08-05 | Discharge: 2019-08-10 | DRG: 758 | Disposition: A | Discharge status: Home / Self Care | Payer: Medicaid Other | Attending: Obstetrics & Gynecology | Admitting: Obstetrics & Gynecology

## 2019-08-05 ENCOUNTER — Emergency Department (HOSPITAL_BASED_OUTPATIENT_CLINIC_OR_DEPARTMENT_OTHER): Payer: Medicaid Other

## 2019-08-05 ENCOUNTER — Encounter (HOSPITAL_BASED_OUTPATIENT_CLINIC_OR_DEPARTMENT_OTHER): Payer: Self-pay | Admitting: *Deleted

## 2019-08-05 ENCOUNTER — Other Ambulatory Visit: Payer: Self-pay

## 2019-08-05 DIAGNOSIS — Z20822 Contact with and (suspected) exposure to covid-19: Secondary | ICD-10-CM | POA: Diagnosis present

## 2019-08-05 DIAGNOSIS — E119 Type 2 diabetes mellitus without complications: Secondary | ICD-10-CM | POA: Diagnosis present

## 2019-08-05 DIAGNOSIS — F17210 Nicotine dependence, cigarettes, uncomplicated: Secondary | ICD-10-CM | POA: Diagnosis present

## 2019-08-05 DIAGNOSIS — R1032 Left lower quadrant pain: Secondary | ICD-10-CM

## 2019-08-05 DIAGNOSIS — Z833 Family history of diabetes mellitus: Secondary | ICD-10-CM | POA: Diagnosis not present

## 2019-08-05 DIAGNOSIS — E876 Hypokalemia: Secondary | ICD-10-CM | POA: Diagnosis present

## 2019-08-05 DIAGNOSIS — Z794 Long term (current) use of insulin: Secondary | ICD-10-CM | POA: Diagnosis not present

## 2019-08-05 DIAGNOSIS — E669 Obesity, unspecified: Secondary | ICD-10-CM | POA: Diagnosis present

## 2019-08-05 DIAGNOSIS — Z6841 Body Mass Index (BMI) 40.0 and over, adult: Secondary | ICD-10-CM | POA: Diagnosis not present

## 2019-08-05 DIAGNOSIS — K649 Unspecified hemorrhoids: Secondary | ICD-10-CM | POA: Diagnosis present

## 2019-08-05 DIAGNOSIS — I10 Essential (primary) hypertension: Secondary | ICD-10-CM | POA: Diagnosis present

## 2019-08-05 DIAGNOSIS — F410 Panic disorder [episodic paroxysmal anxiety] without agoraphobia: Secondary | ICD-10-CM | POA: Diagnosis present

## 2019-08-05 DIAGNOSIS — Z79899 Other long term (current) drug therapy: Secondary | ICD-10-CM

## 2019-08-05 DIAGNOSIS — N7093 Salpingitis and oophoritis, unspecified: Principal | ICD-10-CM | POA: Diagnosis present

## 2019-08-05 LAB — CBC WITH DIFFERENTIAL/PLATELET
Abs Immature Granulocytes: 0.07 10*3/uL (ref 0.00–0.07)
Basophils Absolute: 0.1 10*3/uL (ref 0.0–0.1)
Basophils Relative: 1 %
Eosinophils Absolute: 0 10*3/uL (ref 0.0–0.5)
Eosinophils Relative: 0 %
HCT: 38.1 % (ref 36.0–46.0)
Hemoglobin: 12.4 g/dL (ref 12.0–15.0)
Immature Granulocytes: 1 %
Lymphocytes Relative: 5 %
Lymphs Abs: 0.5 10*3/uL — ABNORMAL LOW (ref 0.7–4.0)
MCH: 27.6 pg (ref 26.0–34.0)
MCHC: 32.5 g/dL (ref 30.0–36.0)
MCV: 84.7 fL (ref 80.0–100.0)
Monocytes Absolute: 0.1 10*3/uL (ref 0.1–1.0)
Monocytes Relative: 1 %
Neutro Abs: 8.7 10*3/uL — ABNORMAL HIGH (ref 1.7–7.7)
Neutrophils Relative %: 92 %
Platelets: 206 10*3/uL (ref 150–400)
RBC: 4.5 MIL/uL (ref 3.87–5.11)
RDW: 14.8 % (ref 11.5–15.5)
WBC: 9.3 10*3/uL (ref 4.0–10.5)
nRBC: 0 % (ref 0.0–0.2)

## 2019-08-05 LAB — COMPREHENSIVE METABOLIC PANEL
ALT: 17 U/L (ref 0–44)
AST: 17 U/L (ref 15–41)
Albumin: 3.3 g/dL — ABNORMAL LOW (ref 3.5–5.0)
Alkaline Phosphatase: 123 U/L (ref 38–126)
Anion gap: 12 (ref 5–15)
BUN: 8 mg/dL (ref 6–20)
CO2: 23 mmol/L (ref 22–32)
Calcium: 8.8 mg/dL — ABNORMAL LOW (ref 8.9–10.3)
Chloride: 96 mmol/L — ABNORMAL LOW (ref 98–111)
Creatinine, Ser: 0.63 mg/dL (ref 0.44–1.00)
GFR calc Af Amer: >60 mL/min (ref >60)
GFR calc non Af Amer: >60 mL/min (ref >60)
Glucose, Bld: 328 mg/dL — ABNORMAL HIGH (ref 70–99)
Potassium: 2.7 mmol/L — CL (ref 3.5–5.1)
Sodium: 131 mmol/L — ABNORMAL LOW (ref 135–145)
Total Bilirubin: 0.6 mg/dL (ref 0.3–1.2)
Total Protein: 7.7 g/dL (ref 6.5–8.1)

## 2019-08-05 LAB — URINALYSIS, MICROSCOPIC (REFLEX): RBC / HPF: >50 RBC/hpf (ref 0–5)

## 2019-08-05 LAB — LACTIC ACID, PLASMA
Lactic Acid, Venous: 2.8 mmol/L (ref 0.5–1.9)
Lactic Acid, Venous: 1.6 mmol/L (ref 0.5–1.9)

## 2019-08-05 LAB — URINALYSIS, ROUTINE W REFLEX MICROSCOPIC
Bilirubin Urine: NEGATIVE
Glucose, UA: >=500 mg/dL — AB
Ketones, ur: NEGATIVE mg/dL
Leukocytes,Ua: NEGATIVE
Nitrite: NEGATIVE
Protein, ur: NEGATIVE mg/dL
Specific Gravity, Urine: 1.01 (ref 1.005–1.030)
pH: 5.5 (ref 5.0–8.0)

## 2019-08-05 LAB — LIPASE, BLOOD: Lipase: 19 U/L (ref 11–51)

## 2019-08-05 LAB — PREGNANCY, URINE: Preg Test, Ur: NEGATIVE

## 2019-08-05 MED ORDER — SODIUM CHLORIDE 0.9 % IV BOLUS
1000.0000 mL | Freq: Once | INTRAVENOUS | Status: AC
  Administered 2019-08-05: 1000 mL via INTRAVENOUS

## 2019-08-05 MED ORDER — POTASSIUM CHLORIDE 10 MEQ/100ML IV SOLN
10.0000 meq | Freq: Once | INTRAVENOUS | Status: AC
  Administered 2019-08-05: 10 meq via INTRAVENOUS
  Filled 2019-08-05: qty 100

## 2019-08-05 MED ORDER — IOHEXOL 300 MG/ML  SOLN
100.0000 mL | Freq: Once | INTRAMUSCULAR | Status: AC | PRN
  Administered 2019-08-05: 100 mL via INTRAVENOUS

## 2019-08-05 MED ORDER — SODIUM CHLORIDE 0.9% FLUSH
3.0000 mL | Freq: Once | INTRAVENOUS | Status: AC
  Administered 2019-08-05: 3 mL via INTRAVENOUS
  Filled 2019-08-05: qty 3

## 2019-08-05 MED ORDER — HYDROMORPHONE HCL 1 MG/ML IJ SOLN
0.5000 mg | INTRAMUSCULAR | Status: AC | PRN
  Administered 2019-08-05 – 2019-08-06 (×2): 0.5 mg via INTRAVENOUS
  Filled 2019-08-05 (×3): qty 1

## 2019-08-05 MED ORDER — ONDANSETRON HCL 4 MG/2ML IJ SOLN
4.0000 mg | Freq: Three times a day (TID) | INTRAMUSCULAR | Status: AC | PRN
  Administered 2019-08-05: 4 mg via INTRAVENOUS
  Filled 2019-08-05: qty 2

## 2019-08-05 MED ORDER — ACETAMINOPHEN 500 MG PO TABS
1000.0000 mg | ORAL_TABLET | Freq: Once | ORAL | Status: AC
  Administered 2019-08-05: 1000 mg via ORAL
  Filled 2019-08-05: qty 2

## 2019-08-05 NOTE — ED Notes (Signed)
ED Provider at bedside discussing test results and plan of care. 

## 2019-08-05 NOTE — ED Notes (Signed)
ED Provider at bedside. 

## 2019-08-05 NOTE — ED Triage Notes (Addendum)
She was seen at [facility] 2 days ago and diagnosed with diverticulitis. Here today with continued abdominal pain with new c/o diarrhea and chest pain.

## 2019-08-05 NOTE — ED Provider Notes (Signed)
[facility] EMERGENCY DEPARTMENT Provider Note   CSN: [id number] Arrival date & time: 08/05/19  1539     History Chief Complaint  Patient presents with  . Abdominal Pain  . Chest Pain    Robyn [name] is a 39 y.o. female.  Patient is a 39 year old female with past medical history of diverticulosis/diverticular abscess, hypertension, diabetes, and polycystic ovaries.  Patient presents today with complaints of ongoing fever and abdominal pain.  She describes left lower quadrant pain that has been present for several days.  She was seen at [facility] long 2 days ago and underwent CT scan showing a cystic area in the left lower quadrant which was felt to be due to ovarian cyst.  Patient was treated presumptively with Augmentin for diverticulitis as these are the symptoms that she had in September when she was diagnosed with this.  Patient presents here today stating that she is not feeling any better.  She is having worsening abdominal pain and continues to be febrile.  The history is provided by the patient.  Abdominal Pain Pain location:  LLQ Pain quality: stabbing   Pain radiates to:  Does not radiate Pain severity:  Moderate Onset quality:  Gradual Timing:  Constant Progression:  Worsening Chronicity:  Recurrent Relieved by:  Nothing Worsened by:  Nothing Ineffective treatments: Augmentin. Associated symptoms: chest pain   Chest Pain Associated symptoms: abdominal pain        Past Medical History:  Diagnosis Date  . Anxiety    h/o panic attacks  . Diabetes mellitus without complication (HCC)    diet controlled   . Diverticulosis 02/16/2019  . Herpes   . Hypertension   . Left ovarian cyst 11/29/2011  . PCOS (polycystic ovarian syndrome)   . Pregnancy induced hypertension   . Shortness of breath dyspnea    with exertion and while pregnant  . Smoker 11/29/2011    Patient Active Problem List   Diagnosis Date Noted  . Colonic diverticular abscess 02/10/2019    . Diverticulitis 02/09/2019  . S/P repeat low transverse C-section 03/23/2015  . Hypertension - on Labetalol 200 mg po bid 11/22/2014  . Diabetes mellitus type 2, uncontrolled, without complications -- dx'd in 2011 11/22/2014  . Hx of incompetent cervix, currently pregnant - cerclage placed on 10/09/14 by Dr. Mancel Bale 11/22/2014  . History of preterm delivery x 2 - weekly 17P injections - first injection at 16.5 wks 11/22/2014  . Elderly multigravida 11/22/2014  . PCOS (polycystic ovarian syndrome) 08/21/2014  . Herpes 08/21/2014  . ANA positive 08/21/2014  . Previous cesarean delivery, antepartum condition or complication x 2--previous vertical incision 08/21/2014  . Obesity 05/20/2012    Past Surgical History:  Procedure Laterality Date  . CERVICAL CERCLAGE N/A 10/09/2014   Procedure: CERCLAGE CERVICAL;  Surgeon: Everett Graff, MD;  Location: [facility];  Service: Gynecology;  Laterality: N/A;  . CESAREAN SECTION     C/S x 2  . CESAREAN SECTION WITH BILATERAL TUBAL LIGATION Bilateral 03/23/2015   Procedure: CESAREAN SECTION WITH BILATERAL TUBAL LIGATION;  Surgeon: Everett Graff, MD;  Location: [facility];  Service: Obstetrics;  Laterality: Bilateral;  Amt OK'd to move 02/25/2015  . FLEXIBLE SIGMOIDOSCOPY N/A 05/08/2019   Procedure: FLEXIBLE SIGMOIDOSCOPY;  Surgeon: Leighton Ruff, MD;  Location: [facility];  Service: Endoscopy;  Laterality: N/A;  . IR RADIOLOGIST EVAL & MGMT  02/20/2019  . IR RADIOLOGIST EVAL & MGMT  03/06/2019  . WISDOM TOOTH EXTRACTION       OB History  Gravida  5   Para  3   Term      Preterm  3   AB  2   Living  3     SAB  1   TAB  1   Ectopic      Multiple  0   Live Births  3           Family History  Problem Relation Age of Onset  . Hypertension Mother   . Diabetes Father   . Stroke Father   . Cancer Maternal Grandmother   . Cancer Maternal Uncle     Social History   Tobacco Use  . Smoking status: Current Every Day Smoker     Packs/day: 0.25    Years: 13.00    Pack years: 3.25    Types: Cigarettes  . Smokeless tobacco: Never Used  Substance Use Topics  . Alcohol use: Yes    Comment: socially and rarely  . Drug use: No    Home Medications Prior to Admission medications   Medication Sig Start Date End Date Taking? Authorizing Provider  amoxicillin-clavulanate (AUGMENTIN) 875-125 MG tablet Take 1 tablet by mouth every 12 (twelve) hours. 08/02/19   Bethel Born, PA-C  glipiZIDE (GLUCOTROL) 5 MG tablet Take 5 mg by mouth 2 (two) times daily. 01/21/19   [provider]  glucose blood (ONETOUCH VERIO) test strip 1 each by Other route 4 (four) times daily -  before meals and at bedtime. And lancets 4/day 01/13/15   Romero Belling, MD  hydrochlorothiazide (HYDRODIURIL) 25 MG tablet Take 25 mg by mouth daily. 01/24/19   [provider]  losartan (COZAAR) 50 MG tablet Take 50 mg by mouth daily. 01/24/19   [provider]  metFORMIN (GLUCOPHAGE-XR) 500 MG 24 hr tablet Take 1,000 mg by mouth 2 (two) times daily. 12/23/18   [provider]  NOVOLIN 70/30 RELION (70-30) 100 UNIT/ML injection Inject 15 Units into the skin 2 (two) times daily with a meal.  07/17/19   [provider]  oxyCODONE-acetaminophen (PERCOCET/ROXICET) 5-325 MG tablet Take 1 tablet by mouth every 4 (four) hours as needed for severe pain. 08/02/19   Bethel Born, PA-C    Allergies    Patient has no known allergies.  Review of Systems   Review of Systems  Cardiovascular: Positive for chest pain.  Gastrointestinal: Positive for abdominal pain.  All other systems reviewed and are negative.   Physical Exam Updated Vital Signs BP 135/79 Comment: Simultaneous filing. User may not have seen previous data.  Pulse (!) 119 Comment: Simultaneous filing. User may not have seen previous data.  Temp (!) 102.7 F (39.3 C) (Oral)   Resp (!) 23 Comment: Simultaneous filing. User may not have seen previous data.   Ht 5\' 8"  (1.727 m)   Wt 133.8 kg   LMP 08/02/2019   SpO2 97% Comment: Simultaneous filing. User may not have seen previous data.  BMI 44.85 kg/m   Physical Exam Vitals and nursing note reviewed.  Constitutional:      General: She is not in acute distress.    Appearance: She is well-developed. She is not diaphoretic.  HENT:     Head: Normocephalic and atraumatic.  Cardiovascular:     Rate and Rhythm: Normal rate and regular rhythm.     Heart sounds: No murmur. No friction rub. No gallop.   Pulmonary:     Effort: Pulmonary effort is normal. No respiratory distress.     Breath  sounds: Normal breath sounds. No wheezing.  Abdominal:     General: Bowel sounds are normal. There is no distension.     Palpations: Abdomen is soft.     Tenderness: There is abdominal tenderness in the left lower quadrant. There is no guarding or rebound.  Musculoskeletal:        General: Normal range of motion.     Cervical back: Normal range of motion and neck supple.  Skin:    General: Skin is warm and dry.  Neurological:     Mental Status: She is alert and oriented to person, place, and time.     ED Results / Procedures / Treatments   Labs (all labs ordered are listed, but only abnormal results are displayed) Labs Reviewed  CULTURE, BLOOD (ROUTINE X 2)  CULTURE, BLOOD (ROUTINE X 2)  LIPASE, BLOOD  COMPREHENSIVE METABOLIC PANEL  URINALYSIS, ROUTINE W REFLEX MICROSCOPIC  PREGNANCY, URINE  CBC WITH DIFFERENTIAL/PLATELET  LACTIC ACID, PLASMA  LACTIC ACID, PLASMA    EKG ED ECG REPORT   Date: 08/05/2019  Rate: 120  Rhythm: sinus tachycardia  QRS Axis: normal  Intervals: normal  ST/T Wave abnormalities: nonspecific T wave changes  Conduction Disutrbances:none  Narrative Interpretation:   Old EKG Reviewed: none available  I have personally reviewed the EKG tracing and agree with the computerized printout as noted.   Radiology No results found.  Procedures Procedures (including  critical care time)  Medications Ordered in ED Medications  sodium chloride 0.9 % bolus 1,000 mL (has no administration in time range)  acetaminophen (TYLENOL) tablet 1,000 mg (has no administration in time range)  sodium chloride flush (NS) 0.9 % injection 3 mL (3 mLs Intravenous Given 08/05/19 1606)    ED Course  I have reviewed the triage vital signs and the nursing notes.  Pertinent labs & imaging results that were available during my care of the patient were reviewed by me and considered in my medical decision making (see chart for details).    MDM Rules/Calculators/A&P  Patient is a 39 year old female with history of diverticular abscess in September treated with a percutaneous drain.  Patient began having discomfort again several days ago.  She was seen at [facility] long and had a CT scan and ultrasound performed.  This showed what appeared to be a possible tubo-ovarian abscess versus diverticular abscess.  Patient was ultimately discharged on Augmentin.  She presents here tonight with complaints of ongoing discomfort and febrile to 102.7.  Patient's work-up today shows no elevation of white count and reassuring laboratory studies.  A CT scan was repeated due to the ongoing fever.  This did show an interval increase in the size of the left lower quadrant abnormality that was seen 2 days ago.  An ultrasound was recommended and was also obtained.  This shows what appears to be a tubo-ovarian abscess with surrounding inflammatory change.  These findings were discussed with Dr. Sallye Ober who is recommending admission for intravenous antibiotics.  Patient to be transferred to [facility] for admission on the GYN service.  Antibiotics will be deferred until the patient arrives at [facility]  Final Clinical Impression(s) / ED Diagnoses Final diagnoses:  None    Rx / DC Orders ED Discharge Orders    None       Geoffery Lyons, MD 08/05/19 2344

## 2019-08-06 ENCOUNTER — Encounter (HOSPITAL_COMMUNITY): Payer: Self-pay | Admitting: Obstetrics & Gynecology

## 2019-08-06 DIAGNOSIS — N7093 Salpingitis and oophoritis, unspecified: Secondary | ICD-10-CM | POA: Diagnosis present

## 2019-08-06 LAB — COMPREHENSIVE METABOLIC PANEL
ALT: 16 U/L (ref 0–44)
AST: 14 U/L — ABNORMAL LOW (ref 15–41)
Albumin: 2.6 g/dL — ABNORMAL LOW (ref 3.5–5.0)
Alkaline Phosphatase: 72 U/L (ref 38–126)
Anion gap: 12 (ref 5–15)
BUN: <5 mg/dL — ABNORMAL LOW (ref 6–20)
CO2: 27 mmol/L (ref 22–32)
Calcium: 8.6 mg/dL — ABNORMAL LOW (ref 8.9–10.3)
Chloride: 94 mmol/L — ABNORMAL LOW (ref 98–111)
Creatinine, Ser: 0.78 mg/dL (ref 0.44–1.00)
GFR calc Af Amer: >60 mL/min (ref >60)
GFR calc non Af Amer: >60 mL/min (ref >60)
Glucose, Bld: 194 mg/dL — ABNORMAL HIGH (ref 70–99)
Potassium: 2.8 mmol/L — ABNORMAL LOW (ref 3.5–5.1)
Sodium: 133 mmol/L — ABNORMAL LOW (ref 135–145)
Total Bilirubin: 0.4 mg/dL (ref 0.3–1.2)
Total Protein: 7 g/dL (ref 6.5–8.1)

## 2019-08-06 LAB — CBC WITH DIFFERENTIAL/PLATELET
Abs Immature Granulocytes: 0.31 10*3/uL — ABNORMAL HIGH (ref 0.00–0.07)
Basophils Absolute: 0.1 10*3/uL (ref 0.0–0.1)
Basophils Relative: 1 %
Eosinophils Absolute: 0 10*3/uL (ref 0.0–0.5)
Eosinophils Relative: 0 %
HCT: 32.3 % — ABNORMAL LOW (ref 36.0–46.0)
Hemoglobin: 10.5 g/dL — ABNORMAL LOW (ref 12.0–15.0)
Immature Granulocytes: 2 %
Lymphocytes Relative: 10 %
Lymphs Abs: 1.6 10*3/uL (ref 0.7–4.0)
MCH: 27.4 pg (ref 26.0–34.0)
MCHC: 32.5 g/dL (ref 30.0–36.0)
MCV: 84.3 fL (ref 80.0–100.0)
Monocytes Absolute: 1.1 10*3/uL — ABNORMAL HIGH (ref 0.1–1.0)
Monocytes Relative: 7 %
Neutro Abs: 12.6 10*3/uL — ABNORMAL HIGH (ref 1.7–7.7)
Neutrophils Relative %: 80 %
Platelets: 208 10*3/uL (ref 150–400)
RBC: 3.83 MIL/uL — ABNORMAL LOW (ref 3.87–5.11)
RDW: 14.7 % (ref 11.5–15.5)
WBC: 15.6 10*3/uL — ABNORMAL HIGH (ref 4.0–10.5)
nRBC: 0 % (ref 0.0–0.2)

## 2019-08-06 LAB — SARS CORONAVIRUS 2 (TAT 6-24 HRS): SARS Coronavirus 2: NEGATIVE

## 2019-08-06 LAB — GLUCOSE, CAPILLARY
Glucose-Capillary: 179 mg/dL — ABNORMAL HIGH (ref 70–99)
Glucose-Capillary: 215 mg/dL — ABNORMAL HIGH (ref 70–99)
Glucose-Capillary: 189 mg/dL — ABNORMAL HIGH (ref 70–99)
Glucose-Capillary: 263 mg/dL — ABNORMAL HIGH (ref 70–99)
Glucose-Capillary: 210 mg/dL — ABNORMAL HIGH (ref 70–99)

## 2019-08-06 LAB — GC/CHLAMYDIA PROBE AMP (~~LOC~~) NOT AT ARMC
Chlamydia: NEGATIVE
Neisseria Gonorrhea: NEGATIVE

## 2019-08-06 MED ORDER — ACETAMINOPHEN 500 MG PO TABS
1000.0000 mg | ORAL_TABLET | Freq: Four times a day (QID) | ORAL | Status: DC | PRN
  Administered 2019-08-06: 1000 mg via ORAL
  Filled 2019-08-06: qty 2

## 2019-08-06 MED ORDER — INSULIN ASPART PROT & ASPART (70-30 MIX) 100 UNIT/ML ~~LOC~~ SUSP
15.0000 [IU] | Freq: Two times a day (BID) | SUBCUTANEOUS | Status: DC
  Administered 2019-08-06 – 2019-08-10 (×8): 15 [IU] via SUBCUTANEOUS
  Filled 2019-08-06: qty 10

## 2019-08-06 MED ORDER — INSULIN ASPART 100 UNIT/ML ~~LOC~~ SOLN
0.0000 [IU] | SUBCUTANEOUS | Status: DC
  Administered 2019-08-06 – 2019-08-07 (×2): 8 [IU] via SUBCUTANEOUS
  Administered 2019-08-07: 4 [IU] via SUBCUTANEOUS
  Administered 2019-08-07 (×2): 2 [IU] via SUBCUTANEOUS
  Administered 2019-08-08: 4 [IU] via SUBCUTANEOUS
  Administered 2019-08-08: 2 [IU] via SUBCUTANEOUS
  Administered 2019-08-08 (×2): 4 [IU] via SUBCUTANEOUS
  Administered 2019-08-09: 2 [IU] via SUBCUTANEOUS
  Administered 2019-08-09 – 2019-08-10 (×3): 4 [IU] via SUBCUTANEOUS
  Administered 2019-08-10: 2 [IU] via SUBCUTANEOUS

## 2019-08-06 MED ORDER — PANTOPRAZOLE SODIUM 20 MG PO TBEC
20.0000 mg | DELAYED_RELEASE_TABLET | Freq: Every day | ORAL | Status: DC
  Administered 2019-08-06 – 2019-08-10 (×5): 20 mg via ORAL
  Filled 2019-08-06 (×5): qty 1

## 2019-08-06 MED ORDER — METFORMIN HCL ER 500 MG PO TB24
500.0000 mg | ORAL_TABLET | Freq: Two times a day (BID) | ORAL | Status: DC
  Administered 2019-08-06 – 2019-08-07 (×3): 500 mg via ORAL
  Filled 2019-08-06 (×5): qty 1

## 2019-08-06 MED ORDER — HYDROCHLOROTHIAZIDE 25 MG PO TABS
25.0000 mg | ORAL_TABLET | Freq: Every day | ORAL | Status: DC
  Administered 2019-08-06 – 2019-08-10 (×5): 25 mg via ORAL
  Filled 2019-08-06 (×5): qty 1

## 2019-08-06 MED ORDER — ACETAMINOPHEN 500 MG PO TABS
1000.0000 mg | ORAL_TABLET | Freq: Four times a day (QID) | ORAL | Status: DC
  Administered 2019-08-06 – 2019-08-10 (×14): 1000 mg via ORAL
  Filled 2019-08-06 (×16): qty 2

## 2019-08-06 MED ORDER — POTASSIUM CHLORIDE CRYS ER 20 MEQ PO TBCR
20.0000 meq | EXTENDED_RELEASE_TABLET | Freq: Two times a day (BID) | ORAL | Status: DC
  Administered 2019-08-06 – 2019-08-07 (×3): 20 meq via ORAL
  Filled 2019-08-06 (×3): qty 1

## 2019-08-06 MED ORDER — LOSARTAN POTASSIUM 50 MG PO TABS
50.0000 mg | ORAL_TABLET | Freq: Every day | ORAL | Status: DC
  Administered 2019-08-06 – 2019-08-10 (×5): 50 mg via ORAL
  Filled 2019-08-06 (×5): qty 1

## 2019-08-06 MED ORDER — METRONIDAZOLE IN NACL 5-0.79 MG/ML-% IV SOLN
500.0000 mg | Freq: Three times a day (TID) | INTRAVENOUS | Status: DC
  Administered 2019-08-06 – 2019-08-09 (×10): 500 mg via INTRAVENOUS
  Filled 2019-08-06 (×10): qty 100

## 2019-08-06 MED ORDER — SODIUM CHLORIDE 0.9 % IV SOLN
2.0000 g | Freq: Two times a day (BID) | INTRAVENOUS | Status: DC
  Administered 2019-08-06 – 2019-08-08 (×7): 2 g via INTRAVENOUS
  Filled 2019-08-06 (×10): qty 2

## 2019-08-06 MED ORDER — SODIUM CHLORIDE 0.9 % IV SOLN
100.0000 mg | Freq: Two times a day (BID) | INTRAVENOUS | Status: DC
  Administered 2019-08-06 – 2019-08-09 (×7): 100 mg via INTRAVENOUS
  Filled 2019-08-06 (×10): qty 100

## 2019-08-06 MED ORDER — OXYCODONE HCL 5 MG PO TABS
5.0000 mg | ORAL_TABLET | ORAL | Status: DC | PRN
  Administered 2019-08-06 – 2019-08-07 (×3): 5 mg via ORAL
  Filled 2019-08-06 (×3): qty 1

## 2019-08-06 NOTE — Progress Notes (Signed)
MD PROGRESS NOTE  Subjective/Chief Complaint: Patient notes feeling better than yesterday.  She is still having some abdominal pain.  She denies chills, nausea, vomiting.  She is voiding w/o difficulty.  No BM.    Objective: Vital signs in last 24 hours: Temp:  [98.4 F (36.9 C)-103.1 F (39.5 C)] 98.4 F (36.9 C) (03/10 1027) Pulse Rate:  [83-119] 83 (03/10 1027) Resp:  [17-26] 17 (03/10 1027) BP: (101-147)/(46-79) 101/48 (03/10 1027) SpO2:  [83 %-100 %] 98 % (03/10 1027) Weight:  [133.8 kg] 133.8 kg (03/09 1550) Last BM Date: 08/05/19  Intake/Output from previous day: 03/09 0701 - 03/10 0700 In: 1301.2 [IV Piggyback:1301.2] Out: -  Intake/Output this shift: Total I/O In: 300 [P.O.:300] Out: -   General appearance: alert, cooperative and no distress GI: Abdominal exam: Soft, +tender to palpation Left lower quadrant, no rebound or guarding, no peritoneal signs.   Extremities: extremities normal, atraumatic, no cyanosis or edema and Homans sign is negative, no sign of DVT  Lab Results:  Recent Labs    08/05/19 1607 08/06/19 0925  WBC 9.3 15.6*  HGB 12.4 10.5*  HCT 38.1 32.3*  PLT 206 208   BMET Recent Labs    08/05/19 1607  NA 131*  K 2.7*  CL 96*  CO2 23  GLUCOSE 328*  BUN 8  CREATININE 0.63  CALCIUM 8.8*   Micro:  Blood cultures:  No growth x 24 hours   Studies/Results: US Pelvis Complete  Result Date: 08/05/2019 CLINICAL DATA:  39 year old female with enlarged and inflamed appearing left adnexa on CT Abdomen and Pelvis today. EXAM: TRANSABDOMINAL ULTRASOUND OF PELVIS DOPPLER ULTRASOUND OF OVARIES TECHNIQUE: Transabdominal ultrasound examination of the pelvis was performed including evaluation of the uterus, ovaries, adnexal regions, and pelvic cul-de-sac. Color and duplex Doppler ultrasound was utilized to evaluate blood flow to the ovaries. COMPARISON:  CT Abdomen and Pelvis 1745 hours. Recent CT Abdomen and Pelvis and pelvis ultrasound 08/02/2019.  FINDINGS: Uterus Measurements: 9.6 x 5 cm. Partially obscured by bowel gas (image 31). No discrete uterine mass. Endometrium Thickness: 4 mm.  No focal abnormality visualized. Right ovary Measurements: 3.5 x 3.2 x 3.9 cm = volume: 23 mL. Simple appearing right ovarian cyst again noted (image 28). Left ovary Measurements: 6.2 x 4.9 x 6.1 cm = volume: 96 mL. Continued complex, multi cystic appearance of the left ovary/adnexa (image 35). But there is preserved tissue vascularity on color Doppler (image 5). Pulsed Doppler evaluation demonstrates normal low-resistance arterial and venous waveforms in both ovaries. Other: No pelvic free fluid. IMPRESSION: 1. No evidence of left ovarian torsion. Complex, multi-cystic appearance of the left ovary appears stable from the recent ultrasound on 08/02/2019. Although nonspecific, the increased regional inflammation on CT today is suspicious for tubo-ovarian abscess. 2. No pelvic free fluid. Stable and negative right ovary and uterus. Electronically Signed   By: Odessa Fleming M.D.   On: 08/05/2019 21:58   CT ABDOMEN PELVIS W CONTRAST  Result Date: 08/05/2019 CLINICAL DATA:  Worsening abdominal pain and diarrhea. Diverticulitis. EXAM: CT ABDOMEN AND PELVIS WITH CONTRAST TECHNIQUE: Multidetector CT imaging of the abdomen and pelvis was performed using the standard protocol following bolus administration of intravenous contrast. CONTRAST:  OMNIPAQUE IOHEXOL 300 MG/ML  SOLN COMPARISON:  08/02/2019 FINDINGS: Lower Chest: No acute findings. Hepatobiliary: No hepatic masses identified. Mild diffuse hepatic steatosis. Gallbladder is unremarkable. No evidence of biliary ductal dilatation. Pancreas:  No mass or inflammatory changes. Spleen: Within normal limits in size and appearance. Adrenals/Urinary  Tract: No masses identified. No evidence of ureteral calculi or hydronephrosis. Stomach/Bowel: Multi locular cystic lesion with surrounding inflammatory changes is seen in the left lower  quadrant abutting the proximal sigmoid colon. There is only mild thickening of the adjacent proximal sigmoid colon. Vascular/Lymphatic: No pathologically enlarged lymph nodes. No abdominal aortic aneurysm. Reproductive: Increased size of a multilocular cystic lesion in the left adnexa with surrounding inflammatory changes. This abuts the proximal sigmoid colon but favors no adnexal origin. This currently measures 7.0 x 6.4 cm compared with 5.0 x 4.8 cm previously. Other:  None. Musculoskeletal:  No suspicious bone lesions identified. IMPRESSION: Increased size of 7 cm multilocular cystic lesion in the left adnexa with surrounding inflammatory changes, which abuts the proximal sigmoid colon. Differential diagnosis includes tubo-ovarian abscess, cystic ovarian mass with ovarian torsion, with diverticulitis with diverticular abscess considered less likely. Recommend clinical correlation; consider pelvic ultrasound with Doppler for further evaluation. Electronically Signed   By: Danae Orleans M.D.   On: 08/05/2019 18:07   US PELVIC DOPPLER (TORSION R/O OR MASS ARTERIAL FLOW)  Result Date: 08/05/2019 CLINICAL DATA:  39 year old female with enlarged and inflamed appearing left adnexa on CT Abdomen and Pelvis today. EXAM: TRANSABDOMINAL ULTRASOUND OF PELVIS DOPPLER ULTRASOUND OF OVARIES TECHNIQUE: Transabdominal ultrasound examination of the pelvis was performed including evaluation of the uterus, ovaries, adnexal regions, and pelvic cul-de-sac. Color and duplex Doppler ultrasound was utilized to evaluate blood flow to the ovaries. COMPARISON:  CT Abdomen and Pelvis 1745 hours. Recent CT Abdomen and Pelvis and pelvis ultrasound 08/02/2019. FINDINGS: Uterus Measurements: 9.6 x 5 cm. Partially obscured by bowel gas (image 31). No discrete uterine mass. Endometrium Thickness: 4 mm.  No focal abnormality visualized. Right ovary Measurements: 3.5 x 3.2 x 3.9 cm = volume: 23 mL. Simple appearing right ovarian cyst again noted  (image 28). Left ovary Measurements: 6.2 x 4.9 x 6.1 cm = volume: 96 mL. Continued complex, multi cystic appearance of the left ovary/adnexa (image 35). But there is preserved tissue vascularity on color Doppler (image 5). Pulsed Doppler evaluation demonstrates normal low-resistance arterial and venous waveforms in both ovaries. Other: No pelvic free fluid. IMPRESSION: 1. No evidence of left ovarian torsion. Complex, multi-cystic appearance of the left ovary appears stable from the recent ultrasound on 08/02/2019. Although nonspecific, the increased regional inflammation on CT today is suspicious for tubo-ovarian abscess. 2. No pelvic free fluid. Stable and negative right ovary and uterus. Electronically Signed   By: Odessa Fleming M.D.   On: 08/05/2019 21:58    Anti-infectives: Anti-infectives (From admission, onward)   Start     Dose/Rate Route Frequency Ordered Stop   08/06/19 0200  cefoTEtan (CEFOTAN) 2 g in sodium chloride 0.9 % 100 mL IVPB     2 g 200 mL/hr over 30 Minutes Intravenous Every 12 hours 08/06/19 0050     08/06/19 0200  doxycycline (VIBRAMYCIN) 100 mg in sodium chloride 0.9 % 250 mL IVPB     100 mg 125 mL/hr over 120 Minutes Intravenous Every 12 hours 08/06/19 0050     08/06/19 0200  metroNIDAZOLE (FLAGYL) IVPB 500 mg     500 mg 100 mL/hr over 60 Minutes Intravenous Every 8 hours 08/06/19 0050        Assessment/Plan: Ms. Lombardozzi is a 39 year old AAF with Left TOA Continue IV antibiotics until afebrile for 24 hours then will switch to oral antibiotics Consult made to Interventional Radiology Dr. Richarda Overlie regarding possible IR drainage of TOA Discussed possible RSO  in the future.  Dr. Mancel Bale is patients' primary GYN will discuss tomorrow Will repeat CMP today as she was hypokalemic yesterday.   Rogelio Seen Jazsmin Couse 08/06/2019

## 2019-08-06 NOTE — H&P (Addendum)
Robyn [name] is an 39 y.o. female with a history of diverticular abscess (and s/p drainage and subsequent normal sigmoidoscopy in Dec 2020), diverticulitis, left adnexal mass presenting with worsening left lower quadrant pain despite oral antibiotic usage.  Patient reports she has been having left sided abdominal pain for years but in past few days has worsened.  She has a known history of left ovarian cyst that was being managed expectantly.  She had an ED visit on 08/02/19 where she was sent home on oral Augmentin, but pain continued and also developed fevers and chills and presented to ED again.  She has had diarrhea episodes in past few days, loose stools, but none since hospital presentation. She is currently on her period and denies unusual vaginal discharge or odor.      Pertinent Gynecological History: Menses: regular, monthly Contraception: Nexplanon DES exposure: unknown Blood transfusions: none Sexually transmitted diseases: Yes, Herpes Previous GYN Procedures: None  Last pap: normal Date: 05/20/2018 OB History: G5, P1314   Menstrual History:  Patient's last menstrual period was 08/02/2019.    Past Medical History:  Diagnosis Date  . Anxiety    h/o panic attacks  . Diabetes mellitus without complication (HCC)    diet controlled   . Diverticulosis 02/16/2019  . Herpes   . Hypertension   . Left ovarian cyst 11/29/2011  . PCOS (polycystic ovarian syndrome)   . Pregnancy induced hypertension   . Shortness of breath dyspnea    with exertion and while pregnant  . Smoker 11/29/2011    Past Surgical History:  Procedure Laterality Date  . CERVICAL CERCLAGE N/A 10/09/2014   Procedure: CERCLAGE CERVICAL;  Surgeon: Osborn Coho, MD;  Location: [facility];  Service: Gynecology;  Laterality: N/A;  . CESAREAN SECTION     C/S x 2  . CESAREAN SECTION WITH BILATERAL TUBAL LIGATION Bilateral 03/23/2015   Procedure: CESAREAN SECTION WITH BILATERAL TUBAL LIGATION;  Surgeon: Osborn Coho,  MD;  Location: [facility];  Service: Obstetrics;  Laterality: Bilateral;  Amt OK'd to move 02/25/2015  . FLEXIBLE SIGMOIDOSCOPY N/A 05/08/2019   Procedure: FLEXIBLE SIGMOIDOSCOPY;  Surgeon: Romie Levee, MD;  Location: [facility];  Service: Endoscopy;  Laterality: N/A;  . IR RADIOLOGIST EVAL & MGMT  02/20/2019  . IR RADIOLOGIST EVAL & MGMT  03/06/2019  . WISDOM TOOTH EXTRACTION      Family History  Problem Relation Age of Onset  . Hypertension Mother   . Diabetes Father   . Stroke Father   . Cancer Maternal Grandmother   . Cancer Maternal Uncle     Social History:  reports that she has been smoking cigarettes. She has a 3.25 pack-year smoking history. She has never used smokeless tobacco. She reports current alcohol use. She reports that she does not use drugs.  Allergies: No Known Allergies   Review of Systems Constitutional: With fevers/chills Cardiovascular: Denies chest pain or palpitations Pulmonary: Denies coughing or wheezing Gastrointestinal: Denies nausea, vomiting or diarrhea Genitourinary: Denies pelvic pain, unusual vaginal bleeding, unusual vaginal discharge, dysuria, urgency or frequency.  Musculoskeletal: Denies muscle or joint aches and pain.  Neurology: Denies abnormal sensations such as tingling or numbness.   Blood pressure 124/74, pulse 93, temperature (!) 100.4 F (38 C), temperature source Oral, resp. rate 18, height 5\' 8"  (1.727 m), weight 133.8 kg, last menstrual period 08/02/2019, SpO2 97 %.  Tmax: 103.1 at 0131 am on 08/06/19. Tlast 101.5 at 0515 am on 08/06/19.  Vitals:   08/05/19 1900 08/05/19 2030 08/05/19 2230  08/06/19 0004  BP: 113/61 (!) 111/46 105/64 124/74  Pulse: 99 93 90 93  Resp: (!) 24 20  18   Temp:      TempSrc:      SpO2: 98% (!) 83% 100% 97%  Weight:      Height:       Physical Exam Constitutional: She is oriented to person, place, and time. She appears well-developed and well-nourished.  She is obese with BMI of 43.   HENT:  Head:  Normocephalic and atraumatic.  Neck: Normal range of motion.  Cardiovascular: Normal rate, regular rhythm and normal heart sounds.   Respiratory: Effort normal and breath sounds normal.  GI: Soft. Bowel sounds are normal. Abdomen: Soft, tender to palpation in left lower and upper quadrants as well as supraumbilical region.  No rebound, no guarding.   Neurological: She is alert and oriented to person, place, and time.  Skin: Skin is warm and dry.  Psychiatric: She has a normal mood and affect. Her behavior is normal.   Results for orders placed or performed during the hospital encounter of 08/05/19 (from the past 24 hour(s))  Lipase, blood     Status: None   Collection Time: 08/05/19  4:07 PM  Result Value Ref Range   Lipase 19 11 - 51 U/L  Comprehensive metabolic panel     Status: Abnormal   Collection Time: 08/05/19  4:07 PM  Result Value Ref Range   Sodium 131 (L) 135 - 145 mmol/L   Potassium 2.7 (LL) 3.5 - 5.1 mmol/L   Chloride 96 (L) 98 - 111 mmol/L   CO2 23 22 - 32 mmol/L   Glucose, Bld 328 (H) 70 - 99 mg/dL   BUN 8 6 - 20 mg/dL   Creatinine, Ser 0.63 0.44 - 1.00 mg/dL   Calcium 8.8 (L) 8.9 - 10.3 mg/dL   Total Protein 7.7 6.5 - 8.1 g/dL   Albumin 3.3 (L) 3.5 - 5.0 g/dL   AST 17 15 - 41 U/L   ALT 17 0 - 44 U/L   Alkaline Phosphatase 123 38 - 126 U/L   Total Bilirubin 0.6 0.3 - 1.2 mg/dL   GFR calc non Af Amer >60 >60 mL/min   GFR calc Af Amer >60 >60 mL/min   Anion gap 12 5 - 15  CBC with Differential     Status: Abnormal   Collection Time: 08/05/19  4:07 PM  Result Value Ref Range   WBC 9.3 4.0 - 10.5 K/uL   RBC 4.50 3.87 - 5.11 MIL/uL   Hemoglobin 12.4 12.0 - 15.0 g/dL   HCT 38.1 36.0 - 46.0 %   MCV 84.7 80.0 - 100.0 fL   MCH 27.6 26.0 - 34.0 pg   MCHC 32.5 30.0 - 36.0 g/dL   RDW 14.8 11.5 - 15.5 %   Platelets 206 150 - 400 K/uL   nRBC 0.0 0.0 - 0.2 %   Neutrophils Relative % 92 %   Neutro Abs 8.7 (H) 1.7 - 7.7 K/uL   Lymphocytes Relative 5 %   Lymphs Abs 0.5  (L) 0.7 - 4.0 K/uL   Monocytes Relative 1 %   Monocytes Absolute 0.1 0.1 - 1.0 K/uL   Eosinophils Relative 0 %   Eosinophils Absolute 0.0 0.0 - 0.5 K/uL   Basophils Relative 1 %   Basophils Absolute 0.1 0.0 - 0.1 K/uL   WBC Morphology TOXIC GRANULATION    Smear Review MORPHOLOGY UNREMARKABLE    Immature Granulocytes 1 %  Abs Immature Granulocytes 0.07 0.00 - 0.07 K/uL   Abnormal Lymphocytes Present PRESENT    Ovalocytes PRESENT   Lactic acid, plasma     Status: Abnormal   Collection Time: 08/05/19  4:07 PM  Result Value Ref Range   Lactic Acid, Venous 2.8 (HH) 0.5 - 1.9 mmol/L  Culture, blood (routine x 2)     Status: None (Preliminary result)   Collection Time: 08/05/19  4:58 PM   Specimen: BLOOD RIGHT FOREARM  Result Value Ref Range   Specimen Description      BLOOD RIGHT FOREARM Performed at [facility], [address] [place], [place] [address]    Special Requests      BOTTLES DRAWN AEROBIC AND ANAEROBIC Blood Culture adequate volume Performed at [facility] [place], [address] [place], [place] [address]    Culture PENDING    Report Status PENDING   Culture, blood (routine x 2)     Status: None (Preliminary result)   Collection Time: 08/05/19  4:58 PM   Specimen: BLOOD LEFT FOREARM  Result Value Ref Range   Specimen Description      BLOOD LEFT FOREARM Performed at [facility], [address] [place], [place] [address]    Special Requests      BOTTLES DRAWN AEROBIC AND ANAEROBIC Blood Culture adequate volume Performed at [facility] [place] [facility], [address] [place], [place] [address]    Culture PENDING    Report Status PENDING   Lactic acid, plasma     Status: None   Collection Time: 08/05/19  6:25 PM  Result Value Ref Range   Lactic Acid, Venous 1.6 0.5 - 1.9 mmol/L  Urinalysis, Routine w reflex microscopic     Status: Abnormal   Collection Time: 08/05/19  6:39 PM  Result Value Ref Range   Color, Urine YELLOW YELLOW   APPearance  CLOUDY (A) CLEAR   Specific Gravity, Urine 1.010 1.005 - 1.030   pH 5.5 5.0 - 8.0   Glucose, UA >=500 (A) NEGATIVE mg/dL   Hgb urine dipstick LARGE (A) NEGATIVE   Bilirubin Urine NEGATIVE NEGATIVE   Ketones, ur NEGATIVE NEGATIVE mg/dL   Protein, ur NEGATIVE NEGATIVE mg/dL   Nitrite NEGATIVE NEGATIVE   Leukocytes,Ua NEGATIVE NEGATIVE  Pregnancy, urine     Status: None   Collection Time: 08/05/19  6:39 PM  Result Value Ref Range   Preg Test, Ur NEGATIVE NEGATIVE  Urinalysis, Microscopic (reflex)     Status: Abnormal   Collection Time: 08/05/19  6:39 PM  Result Value Ref Range   RBC / HPF >50 0 - 5 RBC/hpf   WBC, UA 0-5 0 - 5 WBC/hpf   Bacteria, UA RARE (A) NONE SEEN   Squamous Epithelial / LPF 6-10 0 - 5    US Pelvis Complete  Result Date: 08/05/2019 CLINICAL DATA:  39 year old female with enlarged and inflamed appearing left adnexa on CT Abdomen and Pelvis today. EXAM: TRANSABDOMINAL ULTRASOUND OF PELVIS DOPPLER ULTRASOUND OF OVARIES TECHNIQUE: Transabdominal ultrasound examination of the pelvis was performed including evaluation of the uterus, ovaries, adnexal regions, and pelvic cul-de-sac. Color and duplex Doppler ultrasound was utilized to evaluate blood flow to the ovaries. COMPARISON:  CT Abdomen and Pelvis 1745 hours. Recent CT Abdomen and Pelvis and pelvis ultrasound 08/02/2019. FINDINGS: Uterus Measurements: 9.6 x 5 cm. Partially obscured by bowel gas (image 31). No discrete uterine mass. Endometrium Thickness: 4 mm.  No focal abnormality visualized. Right ovary Measurements:  3.5 x 3.2 x 3.9 cm = volume: 23 mL. Simple appearing right ovarian cyst again noted (image 28). Left ovary Measurements: 6.2 x 4.9 x 6.1 cm = volume: 96 mL. Continued complex, multi cystic appearance of the left ovary/adnexa (image 35). But there is preserved tissue vascularity on color Doppler (image 5). Pulsed Doppler evaluation demonstrates normal low-resistance arterial and venous waveforms in both ovaries.  Other: No pelvic free fluid. IMPRESSION: 1. No evidence of left ovarian torsion. Complex, multi-cystic appearance of the left ovary appears stable from the recent ultrasound on 08/02/2019. Although nonspecific, the increased regional inflammation on CT today is suspicious for tubo-ovarian abscess. 2. No pelvic free fluid. Stable and negative right ovary and uterus. Electronically Signed   By: Odessa Fleming M.D.   On: 08/05/2019 21:58   CT ABDOMEN PELVIS W CONTRAST  Result Date: 08/05/2019 CLINICAL DATA:  Worsening abdominal pain and diarrhea. Diverticulitis. EXAM: CT ABDOMEN AND PELVIS WITH CONTRAST TECHNIQUE: Multidetector CT imaging of the abdomen and pelvis was performed using the standard protocol following bolus administration of intravenous contrast. CONTRAST:  OMNIPAQUE IOHEXOL 300 MG/ML  SOLN COMPARISON:  08/02/2019 FINDINGS: Lower Chest: No acute findings. Hepatobiliary: No hepatic masses identified. Mild diffuse hepatic steatosis. Gallbladder is unremarkable. No evidence of biliary ductal dilatation. Pancreas:  No mass or inflammatory changes. Spleen: Within normal limits in size and appearance. Adrenals/Urinary Tract: No masses identified. No evidence of ureteral calculi or hydronephrosis. Stomach/Bowel: Multi locular cystic lesion with surrounding inflammatory changes is seen in the left lower quadrant abutting the proximal sigmoid colon. There is only mild thickening of the adjacent proximal sigmoid colon. Vascular/Lymphatic: No pathologically enlarged lymph nodes. No abdominal aortic aneurysm. Reproductive: Increased size of a multilocular cystic lesion in the left adnexa with surrounding inflammatory changes. This abuts the proximal sigmoid colon but favors no adnexal origin. This currently measures 7.0 x 6.4 cm compared with 5.0 x 4.8 cm previously. Other:  None. Musculoskeletal:  No suspicious bone lesions identified. IMPRESSION: Increased size of 7 cm multilocular cystic lesion in the left  adnexa with surrounding inflammatory changes, which abuts the proximal sigmoid colon. Differential diagnosis includes tubo-ovarian abscess, cystic ovarian mass with ovarian torsion, with diverticulitis with diverticular abscess considered less likely. Recommend clinical correlation; consider pelvic ultrasound with Doppler for further evaluation. Electronically Signed   By: Danae Orleans M.D.   On: 08/05/2019 18:07   US PELVIC DOPPLER (TORSION R/O OR MASS ARTERIAL FLOW)  Result Date: 08/05/2019 CLINICAL DATA:  39 year old female with enlarged and inflamed appearing left adnexa on CT Abdomen and Pelvis today. EXAM: TRANSABDOMINAL ULTRASOUND OF PELVIS DOPPLER ULTRASOUND OF OVARIES TECHNIQUE: Transabdominal ultrasound examination of the pelvis was performed including evaluation of the uterus, ovaries, adnexal regions, and pelvic cul-de-sac. Color and duplex Doppler ultrasound was utilized to evaluate blood flow to the ovaries. COMPARISON:  CT Abdomen and Pelvis 1745 hours. Recent CT Abdomen and Pelvis and pelvis ultrasound 08/02/2019. FINDINGS: Uterus Measurements: 9.6 x 5 cm. Partially obscured by bowel gas (image 31). No discrete uterine mass. Endometrium Thickness: 4 mm.  No focal abnormality visualized. Right ovary Measurements: 3.5 x 3.2 x 3.9 cm = volume: 23 mL. Simple appearing right ovarian cyst again noted (image 28). Left ovary Measurements: 6.2 x 4.9 x 6.1 cm = volume: 96 mL. Continued complex, multi cystic appearance of the left ovary/adnexa (image 35). But there is preserved tissue vascularity on color Doppler (image 5). Pulsed Doppler evaluation demonstrates normal low-resistance arterial and venous waveforms in both ovaries. Other: No  pelvic free fluid. IMPRESSION: 1. No evidence of left ovarian torsion. Complex, multi-cystic appearance of the left ovary appears stable from the recent ultrasound on 08/02/2019. Although nonspecific, the increased regional inflammation on CT today is suspicious for  tubo-ovarian abscess. 2. No pelvic free fluid. Stable and negative right ovary and uterus. Electronically Signed   By: Odessa Fleming M.D.   On: 08/05/2019 21:58   Ref Range & Units 4 d ago  (08/02/19) 4 yr ago  (08/11/14) 7 yr ago  (05/20/12) 7 yr ago  (11/29/11)   Yeast Wet Prep HPF POC NONE SEEN NONE SEEN  NONE SEEN   R  NONE SEEN   Trich, Wet Prep NONE SEEN NONE SEEN  NONE SEEN   NONE SEEN   Clue Cells Wet Prep HPF POC NONE SEEN PRESENTAbnormal   NONE SEEN   R  FEWAbnormal    WBC, Wet Prep HPF POC NONE SEEN MANYAbnormal   FEWAbnormal  CM   R  FEWAbnormal  CM   Sperm  NONE SEEN      Comment: Performed at [facility], [address] [place], [place] [address]    Assessment/Plan: 39 y/o with left sided and supraumbilical abdominal pain, likely with tubo-ovarian abscess Admit to [address] floor Tylenol and IV Antibiotics Cefotetan, doxycycline, metronidazole now then plan to switch to oral meds.  Urine GC/chlamydia Follow up on blood cultures, CBC later today. Konrad Felix, MD.  08/06/2019, 12:16 AM

## 2019-08-06 NOTE — Consult Note (Addendum)
Chief Complaint: Patient was seen in consultation today for tubo-ovarian abscess aspiration/drain placement  Referring Physician(s): Hoover Browns, MD  Supervising Physician: Richarda Overlie  Patient Status: [facility] - In-pt  History of Present Illness: Robyn [name] is a 39 y.o. female with a past medical history significant for anxiety, HTN, diverticular abscess s/p drain placement 02/09/19 in IR with subsequent removal 03/06/19, DM and PCOS who presented [facility] ED yesterday with complaints of left lower quadrant pain which had not improved after receiving Augmentin for presumptive diverticulitis 2 days prior in the [facility] ED when CT A/P w/contrast showed a complex cystic mass in the region of th left ovary where patient's previously drained diverticular abscess was located. Initial work up yesterday notable for tachycardia, Tmax 102.7, tachypnea and interval increase in the size of the multilocular cystic lesion in the left adnexa with surrounding inflammatory changes abutting the proximal sigmoid colon (now 7 cm, previously 5 cm on 08/02/19). She was admitted to [facility] for further evaluation and management. She was started on IV antibiotics and IR has been consulted for possible tubo-ovarian abscess aspiration/possible drain placement.  Robyn [name] sleeping upon entry to her room, arouses easily to voice cues. She states her pain is a little better than yesterday but is still fairly significant. She reports some non-radiating chest pain yesterday, but none today. She has been tolerating some food and water today without issue, she has not had a BM since arrival. She states understanding of the requested procedure as she previously had a drain placed in IR and is agreeable to proceed.   Past Medical History:  Diagnosis Date  . Anxiety    h/o panic attacks  . Diabetes mellitus without complication (HCC)    diet controlled   . Diverticulosis 02/16/2019  . Herpes   . Hypertension   . Left ovarian  cyst 11/29/2011  . PCOS (polycystic ovarian syndrome)   . Pregnancy induced hypertension   . Shortness of breath dyspnea    with exertion and while pregnant  . Smoker 11/29/2011    Past Surgical History:  Procedure Laterality Date  . CERVICAL CERCLAGE N/A 10/09/2014   Procedure: CERCLAGE CERVICAL;  Surgeon: Osborn Coho, MD;  Location: [facility] ORS;  Service: Gynecology;  Laterality: N/A;  . CESAREAN SECTION     C/S x 2  . CESAREAN SECTION WITH BILATERAL TUBAL LIGATION Bilateral 03/23/2015   Procedure: CESAREAN SECTION WITH BILATERAL TUBAL LIGATION;  Surgeon: Osborn Coho, MD;  Location: [facility] ORS;  Service: Obstetrics;  Laterality: Bilateral;  Amt OK'd to move 02/25/2015  . FLEXIBLE SIGMOIDOSCOPY N/A 05/08/2019   Procedure: FLEXIBLE SIGMOIDOSCOPY;  Surgeon: Romie Levee, MD;  Location: [facility] ENDOSCOPY;  Service: Endoscopy;  Laterality: N/A;  . IR RADIOLOGIST EVAL & MGMT  02/20/2019  . IR RADIOLOGIST EVAL & MGMT  03/06/2019  . WISDOM TOOTH EXTRACTION      Allergies: Patient has no known allergies.  Medications: Prior to Admission medications   Medication Sig Start Date End Date Taking? Authorizing Provider  hydrochlorothiazide (HYDRODIURIL) 25 MG tablet Take 25 mg by mouth daily. 01/24/19  Yes [provider]  losartan (COZAAR) 50 MG tablet Take 50 mg by mouth daily. 01/24/19  Yes [provider]  metFORMIN (GLUCOPHAGE-XR) 500 MG 24 hr tablet Take 1,000 mg by mouth 2 (two) times daily. 12/23/18  Yes [provider]  NOVOLIN 70/30 RELION (70-30) 100 UNIT/ML injection Inject 15 Units into the skin 2 (two) times daily between meals as needed (if blood sugar is high).  07/17/19  Yes [provider]     Family History  Problem Relation Age of Onset  . Hypertension Mother   . Diabetes Father   . Stroke Father   . Cancer Maternal Grandmother   . Cancer Maternal Uncle     Social History   Socioeconomic History  . Marital status: Married    Spouse name: Not on  file  . Number of children: Not on file  . Years of education: Not on file  . Highest education level: Not on file  Occupational History  . Not on file  Tobacco Use  . Smoking status: Current Every Day Smoker    Packs/day: 0.25    Years: 13.00    Pack years: 3.25    Types: Cigarettes  . Smokeless tobacco: Never Used  Substance and Sexual Activity  . Alcohol use: Yes    Comment: socially and rarely  . Drug use: No  . Sexual activity: Never    Partners: Male    Birth control/protection: None  Other Topics Concern  . Not on file  Social History Narrative  . Not on file   Social Determinants of Health   Financial Resource Strain:   . Difficulty of Paying Living Expenses: Not on file  Food Insecurity:   . Worried About Programme researcher, broadcasting/film/video in the Last Year: Not on file  . Ran Out of Food in the Last Year: Not on file  Transportation Needs:   . Lack of Transportation (Medical): Not on file  . Lack of Transportation (Non-Medical): Not on file  Physical Activity:   . Days of Exercise per Week: Not on file  . Minutes of Exercise per Session: Not on file  Stress:   . Feeling of Stress : Not on file  Social Connections:   . Frequency of Communication with Friends and Family: Not on file  . Frequency of Social Gatherings with Friends and Family: Not on file  . Attends Religious Services: Not on file  . Active Member of Clubs or Organizations: Not on file  . Attends Banker Meetings: Not on file  . Marital Status: Not on file     Review of Systems: A 12 point ROS discussed and pertinent positives are indicated in the HPI above.  All other systems are negative.  Review of Systems  Constitutional: Positive for fatigue and fever. Negative for appetite change and chills.  Respiratory: Negative for cough and shortness of breath.   Cardiovascular: Negative for chest pain.  Gastrointestinal: Positive for abdominal pain. Negative for blood in stool, diarrhea, nausea and  vomiting.  Genitourinary: Negative for hematuria.  Musculoskeletal: Negative for back pain.  Skin: Negative for rash.  Neurological: Negative for dizziness and headaches.    Vital Signs: BP 117/66 (BP Location: Right Arm)   Pulse 88   Temp 98.6 F (37 C) (Oral)   Resp 17   Ht 5\' 8"  (1.727 m)   Wt 294 lb 15.6 oz (133.8 kg)   LMP 08/02/2019   SpO2 99%   BMI 44.85 kg/m   Physical Exam Vitals and nursing note reviewed.  Constitutional:      General: She is not in acute distress. HENT:     Head: Normocephalic.     Mouth/Throat:     Mouth: Mucous membranes are moist.     Pharynx: Oropharynx is clear. No oropharyngeal exudate or posterior oropharyngeal erythema.  Cardiovascular:     Rate and Rhythm: Normal rate and regular rhythm.  Pulmonary:     Effort: Pulmonary effort is normal.     Breath sounds: Normal breath sounds.  Abdominal:     General: Bowel sounds are normal. There is no distension.     Palpations: Abdomen is soft.     Tenderness: There is abdominal tenderness.  Skin:    General: Skin is warm and dry.  Neurological:     Mental Status: She is alert and oriented to person, place, and time.  Psychiatric:        Mood and Affect: Mood normal.        Behavior: Behavior normal.        Thought Content: Thought content normal.        Judgment: Judgment normal.      MD Evaluation Airway: WNL Heart: WNL Abdomen: WNL Chest/ Lungs: WNL ASA  Classification: 2 Mallampati/Airway Score: Two   Imaging: US Transvaginal Non-OB  Result Date: 08/02/2019 CLINICAL DATA:  Left abdominal pain and nausea for the past 3 days. 5 cm complex cystic mass in the region of the left ovary on an abdomen and pelvis CT obtained earlier today. EXAM: TRANSABDOMINAL AND TRANSVAGINAL ULTRASOUND OF PELVIS DOPPLER ULTRASOUND OF OVARIES TECHNIQUE: Both transabdominal and transvaginal ultrasound examinations of the pelvis were performed. Transabdominal technique was performed for global imaging  of the pelvis including uterus, ovaries, adnexal regions, and pelvic cul-de-sac. It was necessary to proceed with endovaginal exam following the transabdominal exam to visualize the uterus, endometrium and ovaries in better detail. Color and duplex Doppler ultrasound was utilized to evaluate blood flow to the ovaries. COMPARISON:  Abdomen and pelvis CT obtained earlier today. Abdomen pelvis CTs dated 02/09/2019 and 02/02/2019. Pelvic ultrasound dated 02/01/2019 and 11/29/2011. FINDINGS: Uterus Measurements: 12.2 x 6.2 x 4.6 cm = volume: 184 mL. No fibroids or other mass visualized. Endometrium Thickness: 2.5 mm.  No focal abnormality visualized. Right ovary Measurements: 4.3 x 3.5 x 2.7 cm = volume: 21 mL. 3.0 cm poorly defined cyst with no visible internal septations or nodularity. Left ovary Measurements: 5.6 x 5.3 x 5.2 cm = volume: 82 mL. Multiple small, poorly defined follicles, similar to the appearance seen on 02/01/2019. Pulsed Doppler evaluation of both ovaries demonstrates normal low-resistance arterial and venous waveforms. Other findings No abnormal free fluid. The examination is limited by patient body habitus, pelvic bowel loops and the fact that the patient was in pain during the examination. IMPRESSION: 1. No acute abnormality. 2. 3.0 cm grossly simple appearing right ovarian cyst. This is almost certainly benign, and no specific imaging follow up is recommended according to the Society of Radiologists in Ultrasound 2010 consensus Conference Statement (D Lenis Noon et al. Management of Asymptomatic Ovarian and Other Adnexal Cysts Imaged at [place]: Society of Radiologists in Ultrasound Consensus Conference Statement 2010. Radiology 256 (Sept 2010): 943-954.). 3. Chronically enlarged left ovary containing multiple follicles without significant change and with no evidence of torsion. Electronically Signed   By: Beckie Salts M.D.   On: 08/02/2019 15:56   US Pelvis Complete  Result Date: 08/05/2019 CLINICAL  DATA:  39 year old female with enlarged and inflamed appearing left adnexa on CT Abdomen and Pelvis today. EXAM: TRANSABDOMINAL ULTRASOUND OF PELVIS DOPPLER ULTRASOUND OF OVARIES TECHNIQUE: Transabdominal ultrasound examination of the pelvis was performed including evaluation of the uterus, ovaries, adnexal regions, and pelvic cul-de-sac. Color and duplex Doppler ultrasound was utilized to evaluate blood flow to the ovaries. COMPARISON:  CT Abdomen and Pelvis 1745 hours. Recent CT Abdomen and Pelvis and pelvis ultrasound 08/02/2019.  FINDINGS: Uterus Measurements: 9.6 x 5 cm. Partially obscured by bowel gas (image 31). No discrete uterine mass. Endometrium Thickness: 4 mm.  No focal abnormality visualized. Right ovary Measurements: 3.5 x 3.2 x 3.9 cm = volume: 23 mL. Simple appearing right ovarian cyst again noted (image 28). Left ovary Measurements: 6.2 x 4.9 x 6.1 cm = volume: 96 mL. Continued complex, multi cystic appearance of the left ovary/adnexa (image 35). But there is preserved tissue vascularity on color Doppler (image 5). Pulsed Doppler evaluation demonstrates normal low-resistance arterial and venous waveforms in both ovaries. Other: No pelvic free fluid. IMPRESSION: 1. No evidence of left ovarian torsion. Complex, multi-cystic appearance of the left ovary appears stable from the recent ultrasound on 08/02/2019. Although nonspecific, the increased regional inflammation on CT today is suspicious for tubo-ovarian abscess. 2. No pelvic free fluid. Stable and negative right ovary and uterus. Electronically Signed   By: Odessa FlemingH  Hall M.D.   On: 08/05/2019 21:58   US Pelvis Complete  Result Date: 08/02/2019 CLINICAL DATA:  Left abdominal pain and nausea for the past 3 days. 5 cm complex cystic mass in the region of the left ovary on an abdomen and pelvis CT obtained earlier today. EXAM: TRANSABDOMINAL AND TRANSVAGINAL ULTRASOUND OF PELVIS DOPPLER ULTRASOUND OF OVARIES TECHNIQUE: Both transabdominal and  transvaginal ultrasound examinations of the pelvis were performed. Transabdominal technique was performed for global imaging of the pelvis including uterus, ovaries, adnexal regions, and pelvic cul-de-sac. It was necessary to proceed with endovaginal exam following the transabdominal exam to visualize the uterus, endometrium and ovaries in better detail. Color and duplex Doppler ultrasound was utilized to evaluate blood flow to the ovaries. COMPARISON:  Abdomen and pelvis CT obtained earlier today. Abdomen pelvis CTs dated 02/09/2019 and 02/02/2019. Pelvic ultrasound dated 02/01/2019 and 11/29/2011. FINDINGS: Uterus Measurements: 12.2 x 6.2 x 4.6 cm = volume: 184 mL. No fibroids or other mass visualized. Endometrium Thickness: 2.5 mm.  No focal abnormality visualized. Right ovary Measurements: 4.3 x 3.5 x 2.7 cm = volume: 21 mL. 3.0 cm poorly defined cyst with no visible internal septations or nodularity. Left ovary Measurements: 5.6 x 5.3 x 5.2 cm = volume: 82 mL. Multiple small, poorly defined follicles, similar to the appearance seen on 02/01/2019. Pulsed Doppler evaluation of both ovaries demonstrates normal low-resistance arterial and venous waveforms. Other findings No abnormal free fluid. The examination is limited by patient body habitus, pelvic bowel loops and the fact that the patient was in pain during the examination. IMPRESSION: 1. No acute abnormality. 2. 3.0 cm grossly simple appearing right ovarian cyst. This is almost certainly benign, and no specific imaging follow up is recommended according to the Society of Radiologists in Ultrasound 2010 consensus Conference Statement (D Lenis NoonLevine et al. Management of Asymptomatic Ovarian and Other Adnexal Cysts Imaged at [place]: Society of Radiologists in Ultrasound Consensus Conference Statement 2010. Radiology 256 (Sept 2010): 943-954.). 3. Chronically enlarged left ovary containing multiple follicles without significant change and with no evidence of torsion.  Electronically Signed   By: Beckie SaltsSteven  Reid M.D.   On: 08/02/2019 15:56   CT ABDOMEN PELVIS W CONTRAST  Result Date: 08/05/2019 CLINICAL DATA:  Worsening abdominal pain and diarrhea. Diverticulitis. EXAM: CT ABDOMEN AND PELVIS WITH CONTRAST TECHNIQUE: Multidetector CT imaging of the abdomen and pelvis was performed using the standard protocol following bolus administration of intravenous contrast. CONTRAST:  100mL OMNIPAQUE IOHEXOL 300 MG/ML  SOLN COMPARISON:  08/02/2019 FINDINGS: Lower Chest: No acute findings. Hepatobiliary: No hepatic masses identified. Mild  diffuse hepatic steatosis. Gallbladder is unremarkable. No evidence of biliary ductal dilatation. Pancreas:  No mass or inflammatory changes. Spleen: Within normal limits in size and appearance. Adrenals/Urinary Tract: No masses identified. No evidence of ureteral calculi or hydronephrosis. Stomach/Bowel: Multi locular cystic lesion with surrounding inflammatory changes is seen in the left lower quadrant abutting the proximal sigmoid colon. There is only mild thickening of the adjacent proximal sigmoid colon. Vascular/Lymphatic: No pathologically enlarged lymph nodes. No abdominal aortic aneurysm. Reproductive: Increased size of a multilocular cystic lesion in the left adnexa with surrounding inflammatory changes. This abuts the proximal sigmoid colon but favors no adnexal origin. This currently measures 7.0 x 6.4 cm compared with 5.0 x 4.8 cm previously. Other:  None. Musculoskeletal:  No suspicious bone lesions identified. IMPRESSION: Increased size of 7 cm multilocular cystic lesion in the left adnexa with surrounding inflammatory changes, which abuts the proximal sigmoid colon. Differential diagnosis includes tubo-ovarian abscess, cystic ovarian mass with ovarian torsion, with diverticulitis with diverticular abscess considered less likely. Recommend clinical correlation; consider pelvic ultrasound with Doppler for further evaluation. Electronically  Signed   By: Danae Orleans M.D.   On: 08/05/2019 18:07   CT Abdomen Pelvis W Contrast  Result Date: 08/02/2019 CLINICAL DATA:  Patient c/o left abdominal pain and nausea x 3 days. Concern for abscess or infection. EXAM: CT ABDOMEN AND PELVIS WITH CONTRAST TECHNIQUE: Multidetector CT imaging of the abdomen and pelvis was performed using the standard protocol following bolus administration of intravenous contrast. CONTRAST:  OMNIPAQUE IOHEXOL 300 MG/ML  SOLN COMPARISON:  CT abdomen pelvis 03/06/2019 FINDINGS: Lower chest: No acute abnormality. Hepatobiliary: No focal liver abnormality is seen. No gallstones, gallbladder wall thickening, or biliary dilatation. Pancreas: Unremarkable. No pancreatic ductal dilatation or surrounding inflammatory changes. Spleen: Normal in size without focal abnormality. Adrenals/Urinary Tract: Adrenal glands are unremarkable. Kidneys are normal, without renal calculi, focal lesion, or hydronephrosis. Bladder is unremarkable. Stomach/Bowel: Stomach is within normal limits. Appendix appears normal. No evidence of bowel wall thickening, distention, or inflammatory changes. Vascular/Lymphatic: No significant vascular findings are present. No enlarged abdominal or pelvic lymph nodes. Reproductive: There is a complex cystic mass in the region of the left ovary measuring 5.0 x 4.7 x 4.3 cm (series 3, image 70) which is in the location of the patient's previously seen diverticular abscess involving the left ovary. There is mild adjacent stranding. This area previously measured up to 5.8 cm in October 2020. The adjacent bowel does not appear to be significantly involved. Other: No abdominal wall hernia or abnormality. No abdominopelvic ascites. Musculoskeletal: No acute or significant osseous findings. IMPRESSION: Complex cystic mass in the region of the left ovary measuring 5.0 x 4.7 x 4.3 cm, in the location of the patient's previously seen diverticular abscess. This area previously  measured up to 5.8 cm in October 2020. The adjacent bowel does not appear to be significantly involved. Further characterization with pelvic ultrasound may be helpful. Electronically Signed   By: Emmaline Kluver M.D.   On: 08/02/2019 13:12   US PELVIC DOPPLER (TORSION R/O OR MASS ARTERIAL FLOW)  Result Date: 08/05/2019 CLINICAL DATA:  39 year old female with enlarged and inflamed appearing left adnexa on CT Abdomen and Pelvis today. EXAM: TRANSABDOMINAL ULTRASOUND OF PELVIS DOPPLER ULTRASOUND OF OVARIES TECHNIQUE: Transabdominal ultrasound examination of the pelvis was performed including evaluation of the uterus, ovaries, adnexal regions, and pelvic cul-de-sac. Color and duplex Doppler ultrasound was utilized to evaluate blood flow to the ovaries. COMPARISON:  CT Abdomen and Pelvis  1745 hours. Recent CT Abdomen and Pelvis and pelvis ultrasound 08/02/2019. FINDINGS: Uterus Measurements: 9.6 x 5 cm. Partially obscured by bowel gas (image 31). No discrete uterine mass. Endometrium Thickness: 4 mm.  No focal abnormality visualized. Right ovary Measurements: 3.5 x 3.2 x 3.9 cm = volume: 23 mL. Simple appearing right ovarian cyst again noted (image 28). Left ovary Measurements: 6.2 x 4.9 x 6.1 cm = volume: 96 mL. Continued complex, multi cystic appearance of the left ovary/adnexa (image 35). But there is preserved tissue vascularity on color Doppler (image 5). Pulsed Doppler evaluation demonstrates normal low-resistance arterial and venous waveforms in both ovaries. Other: No pelvic free fluid. IMPRESSION: 1. No evidence of left ovarian torsion. Complex, multi-cystic appearance of the left ovary appears stable from the recent ultrasound on 08/02/2019. Although nonspecific, the increased regional inflammation on CT today is suspicious for tubo-ovarian abscess. 2. No pelvic free fluid. Stable and negative right ovary and uterus. Electronically Signed   By: Genevie Ann M.D.   On: 08/05/2019 21:58   [place] Art/Ven Flow Abd  Pelv Doppler  Result Date: 08/02/2019 CLINICAL DATA:  Left abdominal pain and nausea for the past 3 days. 5 cm complex cystic mass in the region of the left ovary on an abdomen and pelvis CT obtained earlier today. EXAM: TRANSABDOMINAL AND TRANSVAGINAL ULTRASOUND OF PELVIS DOPPLER ULTRASOUND OF OVARIES TECHNIQUE: Both transabdominal and transvaginal ultrasound examinations of the pelvis were performed. Transabdominal technique was performed for global imaging of the pelvis including uterus, ovaries, adnexal regions, and pelvic cul-de-sac. It was necessary to proceed with endovaginal exam following the transabdominal exam to visualize the uterus, endometrium and ovaries in better detail. Color and duplex Doppler ultrasound was utilized to evaluate blood flow to the ovaries. COMPARISON:  Abdomen and pelvis CT obtained earlier today. Abdomen pelvis CTs dated 02/09/2019 and 02/02/2019. Pelvic ultrasound dated 02/01/2019 and 11/29/2011. FINDINGS: Uterus Measurements: 12.2 x 6.2 x 4.6 cm = volume: 184 mL. No fibroids or other mass visualized. Endometrium Thickness: 2.5 mm.  No focal abnormality visualized. Right ovary Measurements: 4.3 x 3.5 x 2.7 cm = volume: 21 mL. 3.0 cm poorly defined cyst with no visible internal septations or nodularity. Left ovary Measurements: 5.6 x 5.3 x 5.2 cm = volume: 82 mL. Multiple small, poorly defined follicles, similar to the appearance seen on 02/01/2019. Pulsed Doppler evaluation of both ovaries demonstrates normal low-resistance arterial and venous waveforms. Other findings No abnormal free fluid. The examination is limited by patient body habitus, pelvic bowel loops and the fact that the patient was in pain during the examination. IMPRESSION: 1. No acute abnormality. 2. 3.0 cm grossly simple appearing right ovarian cyst. This is almost certainly benign, and no specific imaging follow up is recommended according to the Society of Radiologists in Ultrasound 2010 consensus Conference  Statement (D Clovis Riley et al. Management of Asymptomatic Ovarian and Other Adnexal Cysts Imaged at [place]: Society of Radiologists in [place] Statement 2010. Radiology 256 (Sept 2010): 943-954.). 3. Chronically enlarged left ovary containing multiple follicles without significant change and with no evidence of torsion. Electronically Signed   By: Claudie Revering M.D.   On: 08/02/2019 15:56    Labs:  CBC: Recent Labs    02/14/19 0406 08/02/19 1116 08/05/19 1607 08/06/19 0925  WBC 9.6 9.1 9.3 15.6*  HGB 10.8* 12.4 12.4 10.5*  HCT 34.7* 38.8 38.1 32.3*  PLT 311 184 206 208    COAGS: No results for input(s): INR, APTT in the last 8760 hours.  BMP: Recent Labs    02/14/19 0406 08/02/19 1116 08/05/19 1607 08/06/19 1326  NA 139 135 131* 133*  K 4.3 3.2* 2.7* 2.8*  CL 106 102 96* 94*  CO2 GLUCOSE 112* 345* 328* 194*  BUN <5*  CALCIUM 9.7 8.6* 8.8* 8.6*  CREATININE 0.74 0.67 0.63 0.78  GFRNONAA >60 >60 >60 >60  GFRAA >60 >60 >60 >60    LIVER FUNCTION TESTS: Recent Labs    02/13/19 0416 08/02/19 1116 08/05/19 1607 08/06/19 1326  BILITOT 0.2* 0.8 0.6 0.4  AST 14*  ALT ALKPHOS 62 64 123 72  PROT 7.1 7.5 7.7 7.0  ALBUMIN 2.8* 3.8 3.3* 2.6*    TUMOR MARKERS: No results for input(s): AFPTM, CEA, CA199, CHROMGRNA in the last 8760 hours.  Assessment and Plan:  Robyn [name] is a 39 y.o. female with PMH as per HPI significant for PCOS and LLQ diverticular abscess drain placed 02/09/19 and removed 03/06/19 who presented to [facility] ED yesterday with complaints of LLQ pain similar to previous presentation of diverticular abscess last year. She was found to have an enlarging multilocular cystic lesion in the left adnexa concerning for tubo-ovarian abscess. Patient history and imaging reviewed by Dr. Lowella Dandy today who approves patient for left tubo-ovarian abscess aspiration/drain placement.    Tmax 103.1, WBC 15.6, hgb  10.5, plt 208, UPT (-). She is currently receiving IV Cefotan, Flagyl + doxycycline per primary team. Will plan for aspiration/drain placement tomorrow (3/11) - patient to be NPO at midnight, hold lovenox/heparin, AM labs pending, IR will call for patient when ready.   Risks and benefits discussed with the patient including bleeding, infection, damage to adjacent structures including possible ovarian failure, bowel perforation/fistula connection, and sepsis.  All of the patient's questions were answered, patient is agreeable to proceed.  Consent signed and in chart.  Thank you for this interesting consult.  I greatly enjoyed meeting Robyn [name] and look forward to participating in their care.  A copy of this report was sent to the requesting provider on this date.  Electronically Signed: Villa Herb, PA-C 08/06/2019, 3:18 PM   I spent a total of 40 Minutes in face to face in clinical consultation, greater than 50% of which was counseling/coordinating care for TOA drain.

## 2019-08-06 NOTE — Progress Notes (Signed)
Received patient here in 6N from [facility] via carelink, alert orientedx4. Patient ambulatory, no signs of distress with vital signs as follow: Temp: 103.1, BP: 147/78 (95), HR: 110, RR: 18, Sp02: 98% on RA. Started yellow mews documentation on pt, called and notified MD upon pt's arrival and fever and increased HR. See MAR for order. Oriented patient to room and bed controls, left lying comfortably in bed with call bell at reach, will continue to monitor with remainder of shift.

## 2019-08-06 NOTE — Progress Notes (Signed)
MD at patient's room.

## 2019-08-07 ENCOUNTER — Inpatient Hospital Stay (HOSPITAL_COMMUNITY): Payer: Medicaid Other

## 2019-08-07 LAB — COMPREHENSIVE METABOLIC PANEL
ALT: 14 U/L (ref 0–44)
AST: 11 U/L — ABNORMAL LOW (ref 15–41)
Albumin: 2.6 g/dL — ABNORMAL LOW (ref 3.5–5.0)
Alkaline Phosphatase: 82 U/L (ref 38–126)
Anion gap: 11 (ref 5–15)
BUN: 6 mg/dL (ref 6–20)
CO2: 28 mmol/L (ref 22–32)
Calcium: 8.7 mg/dL — ABNORMAL LOW (ref 8.9–10.3)
Chloride: 96 mmol/L — ABNORMAL LOW (ref 98–111)
Creatinine, Ser: 0.7 mg/dL (ref 0.44–1.00)
GFR calc Af Amer: >60 mL/min (ref >60)
GFR calc non Af Amer: >60 mL/min (ref >60)
Glucose, Bld: 163 mg/dL — ABNORMAL HIGH (ref 70–99)
Potassium: 3.1 mmol/L — ABNORMAL LOW (ref 3.5–5.1)
Sodium: 135 mmol/L (ref 135–145)
Total Bilirubin: 0.3 mg/dL (ref 0.3–1.2)
Total Protein: 7.1 g/dL (ref 6.5–8.1)

## 2019-08-07 LAB — GC/CHLAMYDIA PROBE AMP (~~LOC~~) NOT AT ARMC
Chlamydia: NEGATIVE
Comment: NEGATIVE
Comment: NORMAL
Neisseria Gonorrhea: NEGATIVE

## 2019-08-07 LAB — GLUCOSE, CAPILLARY
Glucose-Capillary: 131 mg/dL — ABNORMAL HIGH (ref 70–99)
Glucose-Capillary: 161 mg/dL — ABNORMAL HIGH (ref 70–99)
Glucose-Capillary: 189 mg/dL — ABNORMAL HIGH (ref 70–99)
Glucose-Capillary: 204 mg/dL — ABNORMAL HIGH (ref 70–99)
Glucose-Capillary: 112 mg/dL — ABNORMAL HIGH (ref 70–99)

## 2019-08-07 MED ORDER — MIDAZOLAM HCL 2 MG/2ML IJ SOLN
INTRAMUSCULAR | Status: AC | PRN
  Administered 2019-08-07 (×2): 1 mg via INTRAVENOUS

## 2019-08-07 MED ORDER — FENTANYL CITRATE (PF) 100 MCG/2ML IJ SOLN
INTRAMUSCULAR | Status: AC
  Filled 2019-08-07: qty 2

## 2019-08-07 MED ORDER — LIDOCAINE-EPINEPHRINE 1 %-1:100000 IJ SOLN
INTRAMUSCULAR | Status: AC
  Filled 2019-08-07: qty 1

## 2019-08-07 MED ORDER — POTASSIUM CHLORIDE CRYS ER 20 MEQ PO TBCR
40.0000 meq | EXTENDED_RELEASE_TABLET | Freq: Two times a day (BID) | ORAL | Status: DC
  Administered 2019-08-07 – 2019-08-10 (×6): 40 meq via ORAL
  Filled 2019-08-07 (×6): qty 2

## 2019-08-07 MED ORDER — ENOXAPARIN SODIUM 40 MG/0.4ML ~~LOC~~ SOLN
40.0000 mg | Freq: Two times a day (BID) | SUBCUTANEOUS | Status: DC
  Administered 2019-08-08 – 2019-08-10 (×5): 40 mg via SUBCUTANEOUS
  Filled 2019-08-07 (×5): qty 0.4

## 2019-08-07 MED ORDER — FENTANYL CITRATE (PF) 100 MCG/2ML IJ SOLN
50.0000 ug | INTRAMUSCULAR | Status: DC | PRN
  Administered 2019-08-07 (×4): 50 ug via INTRAVENOUS
  Filled 2019-08-07 (×3): qty 2

## 2019-08-07 MED ORDER — METFORMIN HCL ER 750 MG PO TB24
750.0000 mg | ORAL_TABLET | Freq: Two times a day (BID) | ORAL | Status: DC
  Administered 2019-08-08 – 2019-08-10 (×5): 750 mg via ORAL
  Filled 2019-08-07 (×5): qty 1

## 2019-08-07 MED ORDER — OXYCODONE HCL 5 MG PO TABS
5.0000 mg | ORAL_TABLET | ORAL | Status: DC | PRN
  Administered 2019-08-07 – 2019-08-09 (×5): 10 mg via ORAL
  Administered 2019-08-09: 5 mg via ORAL
  Administered 2019-08-10: 10 mg via ORAL
  Filled 2019-08-07 (×7): qty 2

## 2019-08-07 MED ORDER — FENTANYL CITRATE (PF) 100 MCG/2ML IJ SOLN
INTRAMUSCULAR | Status: AC
  Administered 2019-08-07: 50 ug via INTRAVENOUS
  Filled 2019-08-07: qty 2

## 2019-08-07 MED ORDER — LIDOCAINE HCL 1 % IJ SOLN
INTRAMUSCULAR | Status: AC
  Filled 2019-08-07: qty 20

## 2019-08-07 MED ORDER — MIDAZOLAM HCL 2 MG/2ML IJ SOLN
INTRAMUSCULAR | Status: AC
  Filled 2019-08-07: qty 4

## 2019-08-07 MED ORDER — SODIUM CHLORIDE 0.9% FLUSH
5.0000 mL | Freq: Three times a day (TID) | INTRAVENOUS | Status: DC
  Administered 2019-08-07 – 2019-08-09 (×7): 5 mL

## 2019-08-07 MED ORDER — FENTANYL CITRATE (PF) 100 MCG/2ML IJ SOLN
100.0000 ug | INTRAMUSCULAR | Status: DC | PRN
  Administered 2019-08-07 – 2019-08-08 (×3): 100 ug via INTRAVENOUS
  Filled 2019-08-07 (×5): qty 2

## 2019-08-07 MED ORDER — FENTANYL CITRATE (PF) 100 MCG/2ML IJ SOLN
INTRAMUSCULAR | Status: AC | PRN
  Administered 2019-08-07: 50 ug via INTRAVENOUS
  Administered 2019-08-07 (×2): 25 ug via INTRAVENOUS

## 2019-08-07 NOTE — Procedures (Signed)
Pre procedural Dx: Left sided tubo-ovarian abscess Post procedural Dx: Same  Technically successful CT guided placed of a 10 Fr drainage catheter placement into the left sided tubo-ovarian abscess yielding 35 cc of purulent fluid.    A representative aspirated sample was capped and sent to the laboratory for analysis.    EBL: None Complications: None immediate  Katherina Right, MD Pager #: [phone]

## 2019-08-07 NOTE — Progress Notes (Signed)
Inpatient Diabetes Program Recommendations  AACE/ADA: New Consensus Statement on Inpatient Glycemic Control   Target Ranges:  Prepandial:   less than 140 mg/dL      Peak postprandial:   less than 180 mg/dL (1-2 hours)      Critically ill patients:  140 - 180 mg/dL   Results for Robyn [name], Robyn [name] (MRN [id number]) as of 08/07/2019 11:24  Ref. Range 08/06/2019 08:34 08/06/2019 12:19 08/06/2019 17:28 08/06/2019 21:37 08/06/2019 23:51 08/07/2019 07:51  Glucose-Capillary Latest Ref Range: 70 - 99 mg/dL 473 (H) 403 (H) 709 (H) 210 (H) 131 (H) 161 (H)   Review of Glycemic Control  Diabetes history: DM2 Outpatient Diabetes medications: Novolin 70/30 15 units BID, Metformin XR 1000 mg BID Current orders for Inpatient glycemic control: Novolog 70/30 15 units BID, Metformin XR 500 mg BID, Novolog 0-24 units Q4H  Inpatient Diabetes Program Recommendations:   Insulin-Noted 70/30 was NOT GIVEN this morning since patient was NPO which is likely to cause glucose to be higher throughout the day today. Please consider increasing 70/30 to 20 units BID.  HbgA1C: Please consider ordering an A1C to evaluate glycemic control over the past 2-3 months.  Thanks, Orlando Penner, RN, MSN, CDE Diabetes Coordinator Inpatient Diabetes Program [phone] (Team Pager from 8am to 5pm)

## 2019-08-07 NOTE — Progress Notes (Addendum)
Subjective: Patient reports tolerating PO, + flatus and + BM.  She is s/p IR placement of drain today and was in a lot of pain near umbilicus upon her return to her room.  Called by RN and pain meds adjusted.  Pt feeling better when I saw her late afternoon.  She reports 2 episodes of nausea since admission and a BM yesterday.  Objective: I have reviewed patient's vital signs, medications and labsTmax at 0500 102.5  General: alert and no distress Resp: clear to auscultation bilaterally Cardio: regular rate and rhythm GI: minimal tenderness, firm abdomen, dressing over drain in place, decreased BSs, no rebound or guarding Extremities: no calf tenderness drain to gravity with serosanguinous fluid about 25cc   Assessment/Plan: 1.ID - Left TOA now with drain in place on cefotetan and flagyl.  Cx from purulent sent when drain placed.  Results pending.  Abx choice to be revisited upon return of results.  I discussed my recommendation with her regarding discharge once 48hrs afebrile.  2.T2DM on metformin and 70/30 insulin and SS.  CBGs still elevated, will increase metformin.  3.SCDs for DVT prophylaxis.  Will change to lovenox in am now that she is s/p placement of drain.  4.Pt had similar episode last year on that left side thought to be diverticulitis.  Neg Sigmoidoscopy per pt following abx.  Discussion of long term plan with pt today.  She is thinking about her options.  She has an appointment with me in the office on Monday.    5.Hypokalemia persists - will increase to q 12 from q 12.   LOS: 2 days    Purcell Nails 08/07/2019, 8:42 PM

## 2019-08-07 NOTE — Plan of Care (Signed)
  Problem: Education: Goal: Knowledge of General Education information will improve Description Including pain rating scale, medication(s)/side effects and non-pharmacologic comfort measures Outcome: Progressing   

## 2019-08-07 NOTE — TOC Initial Note (Signed)
Transition of Care [facility]) - Initial/Assessment Note    Patient Details  Name: Robyn [name] MRN: [id number] Date of Birth: 1980-11-28  Transition of Care [facility]) CM/SW Contact:    Marilu Favre, RN Phone Number: 08/07/2019, 11:26 AM  Clinical Narrative:                 Patient from home with children ( 73 and 40 year old) , patient has family close by if she needs assistance. Patient active with PCP Dr Ouida Sills. Patient currently without insurance, however will have insurance August 28, 2019.   Explained nursing staff will teach her drain care prior to discharge.   Explained [facility] letter and [place] Patient voiced understanding.   Expected Discharge Plan: Home/Self Care Barriers to Discharge: Continued Medical Work up   Patient Goals and CMS Choice Patient states their goals for this hospitalization and ongoing recovery are:: to return to home CMS Medicare.gov Compare Post Acute Care list provided to:: Patient Choice offered to / list presented to : NA  Expected Discharge Plan and Services Expected Discharge Plan: Home/Self Care In-house Referral: Financial Counselor Discharge Planning Services: CM Consult, [facility], [place], Medication Assistance   Living arrangements for the past 2 months: Apartment                 DME Arranged: N/A DME Agency: NA       HH Arranged: NA          Prior Living Arrangements/Services Living arrangements for the past 2 months: Apartment Lives with:: (Children 36 years old and 63 years old) Patient language and need for interpreter reviewed:: Yes Do you feel safe going back to the place where you live?: Yes      Need for Family Participation in Patient Care: Yes (Comment) Care giver support system in place?: Yes (comment)   Criminal Activity/Legal Involvement Pertinent to Current Situation/Hospitalization: No - Comment as needed  Activities of Daily Living Home Assistive Devices/Equipment: Eyeglasses ADL  Screening (condition at time of admission) Patient's cognitive ability adequate to safely complete daily activities?: Yes Is the patient deaf or have difficulty hearing?: No Does the patient have difficulty seeing, even when wearing glasses/contacts?: No Does the patient have difficulty concentrating, remembering, or making decisions?: No Patient able to express need for assistance with ADLs?: Yes Does the patient have difficulty dressing or bathing?: No Independently performs ADLs?: Yes (appropriate for developmental age) Does the patient have difficulty walking or climbing stairs?: No Weakness of Legs: None Weakness of Arms/Hands: None  Permission Sought/Granted   Permission granted to share information with : No              Emotional Assessment Appearance:: Appears stated age Attitude/Demeanor/Rapport: Engaged Affect (typically observed): Accepting Orientation: : Oriented to Situation, Oriented to  Time, Oriented to Place, Oriented to Self Alcohol / Substance Use: Not Applicable    Admission diagnosis:  Tubo-ovarian abscess [N70.93] LLQ pain [R10.32] TOA (tubo-ovarian abscess) [N70.93] Patient Active Problem List   Diagnosis Date Noted  . Tubo-ovarian abscess 08/06/2019  . TOA (tubo-ovarian abscess) 08/05/2019  . Colonic diverticular abscess 02/10/2019  . Diverticulitis 02/09/2019  . S/P repeat low transverse C-section 03/23/2015  . Hypertension - on Labetalol 200 mg po bid 11/22/2014  . Diabetes mellitus type 2, uncontrolled, without complications -- dx'd in 2011 11/22/2014  . Hx of incompetent cervix, currently pregnant - cerclage placed on 10/09/14 by Dr. Mancel Bale 11/22/2014  . History of preterm delivery x 2 - weekly  17P injections - first injection at 16.5 wks 11/22/2014  . Elderly multigravida 11/22/2014  . PCOS (polycystic ovarian syndrome) 08/21/2014  . Herpes 08/21/2014  . ANA positive 08/21/2014  . Previous cesarean delivery, antepartum condition or  complication x 2--previous vertical incision 08/21/2014  . Obesity 05/20/2012   PCP:  Maudie Flakes, FNP Pharmacy:   [id number] [place], [place] - [phone] AT [facility] & [address] [place] [address] Phone: [phone] Fax: [phone]  [facility] [address] [place] - [address] [place] [address] Phone: [phone] Fax: [phone]     Social Determinants of Health (SDOH) Interventions    Readmission Risk Interventions No flowsheet data found.

## 2019-08-08 LAB — CBC WITH DIFFERENTIAL/PLATELET
Abs Immature Granulocytes: 0.11 10*3/uL — ABNORMAL HIGH (ref 0.00–0.07)
Basophils Absolute: 0.1 10*3/uL (ref 0.0–0.1)
Basophils Relative: 1 %
Eosinophils Absolute: 0 10*3/uL (ref 0.0–0.5)
Eosinophils Relative: 0 %
HCT: 33.4 % — ABNORMAL LOW (ref 36.0–46.0)
Hemoglobin: 10.9 g/dL — ABNORMAL LOW (ref 12.0–15.0)
Immature Granulocytes: 1 %
Lymphocytes Relative: 15 %
Lymphs Abs: 1.6 10*3/uL (ref 0.7–4.0)
MCH: 27.3 pg (ref 26.0–34.0)
MCHC: 32.6 g/dL (ref 30.0–36.0)
MCV: 83.7 fL (ref 80.0–100.0)
Monocytes Absolute: 0.9 10*3/uL (ref 0.1–1.0)
Monocytes Relative: 8 %
Neutro Abs: 8.2 10*3/uL — ABNORMAL HIGH (ref 1.7–7.7)
Neutrophils Relative %: 75 %
Platelets: 237 10*3/uL (ref 150–400)
RBC: 3.99 MIL/uL (ref 3.87–5.11)
RDW: 14.7 % (ref 11.5–15.5)
WBC: 10.9 10*3/uL — ABNORMAL HIGH (ref 4.0–10.5)
nRBC: 0 % (ref 0.0–0.2)

## 2019-08-08 LAB — GLUCOSE, CAPILLARY
Glucose-Capillary: 117 mg/dL — ABNORMAL HIGH (ref 70–99)
Glucose-Capillary: 128 mg/dL — ABNORMAL HIGH (ref 70–99)
Glucose-Capillary: 138 mg/dL — ABNORMAL HIGH (ref 70–99)
Glucose-Capillary: 160 mg/dL — ABNORMAL HIGH (ref 70–99)
Glucose-Capillary: 174 mg/dL — ABNORMAL HIGH (ref 70–99)
Glucose-Capillary: 181 mg/dL — ABNORMAL HIGH (ref 70–99)
Glucose-Capillary: 200 mg/dL — ABNORMAL HIGH (ref 70–99)

## 2019-08-08 LAB — COMPREHENSIVE METABOLIC PANEL
ALT: 10 U/L (ref 0–44)
AST: 13 U/L — ABNORMAL LOW (ref 15–41)
Albumin: 2.6 g/dL — ABNORMAL LOW (ref 3.5–5.0)
Alkaline Phosphatase: 67 U/L (ref 38–126)
Anion gap: 15 (ref 5–15)
BUN: <5 mg/dL — ABNORMAL LOW (ref 6–20)
CO2: 26 mmol/L (ref 22–32)
Calcium: 8.3 mg/dL — ABNORMAL LOW (ref 8.9–10.3)
Chloride: 95 mmol/L — ABNORMAL LOW (ref 98–111)
Creatinine, Ser: 0.76 mg/dL (ref 0.44–1.00)
GFR calc Af Amer: >60 mL/min (ref >60)
GFR calc non Af Amer: >60 mL/min (ref >60)
Glucose, Bld: 140 mg/dL — ABNORMAL HIGH (ref 70–99)
Potassium: 3 mmol/L — ABNORMAL LOW (ref 3.5–5.1)
Sodium: 136 mmol/L (ref 135–145)
Total Bilirubin: 0.4 mg/dL (ref 0.3–1.2)
Total Protein: 6.9 g/dL (ref 6.5–8.1)

## 2019-08-08 LAB — HEMOGLOBIN A1C
Hgb A1c MFr Bld: 8.3 % — ABNORMAL HIGH (ref 4.8–5.6)
Mean Plasma Glucose: 191.51 mg/dL

## 2019-08-08 NOTE — Progress Notes (Signed)
Referring Physician(s): Hoover Browns, MD  Supervising Physician: Simonne Come  Patient Status:  [facility] - In-pt  Chief Complaint: Follow up TOA   Subjective:  Patient reports significant pain with procedure yesterday, states she could feel "every thing they were sticking in and pulling out." She is very upset about the pain and states she does not want to see any other doctor besides Dr. Maisie Fus (general surgery) because "I didn't feel a thing last time she did it when I had this." She continues to have quite a bit of pain at the drain insertion site and has been using pain medication frequently. She is willing to let me assess the drain today.   Allergies: Patient has no known allergies.  Medications: Prior to Admission medications   Medication Sig Start Date End Date Taking? Authorizing Provider  hydrochlorothiazide (HYDRODIURIL) 25 MG tablet Take 25 mg by mouth daily. 01/24/19  Yes [provider]  losartan (COZAAR) 50 MG tablet Take 50 mg by mouth daily. 01/24/19  Yes [provider]  metFORMIN (GLUCOPHAGE-XR) 500 MG 24 hr tablet Take 1,000 mg by mouth 2 (two) times daily. 12/23/18  Yes [provider]  NOVOLIN 70/30 RELION (70-30) 100 UNIT/ML injection Inject 15 Units into the skin 2 (two) times daily between meals as needed (if blood sugar is high).  07/17/19  Yes [provider]     Vital Signs: BP 120/64 (BP Location: Right Arm)   Pulse 81   Temp 98.9 F (37.2 C) (Oral)   Resp 17   Ht 5\' 8"  (1.727 m)   Wt 294 lb 15.6 oz (133.8 kg)   LMP 08/02/2019   SpO2 95%   BMI 44.85 kg/m   Physical Exam Vitals reviewed.  Constitutional:      General: She is not in acute distress. HENT:     Head: Normocephalic.  Cardiovascular:     Rate and Rhythm: Normal rate.  Pulmonary:     Effort: Pulmonary effort is normal.  Abdominal:     General: There is no distension.     Palpations: Abdomen is soft.     Tenderness: There is abdominal tenderness (LLQ  at drain insertion site).     Comments: (+) LLQ drain to gravity with serosanguineous appearing output. Flushes and aspirates easily, somewhat tender during flushing. Insertion site clean, dry, dressed appropriately. Suture and stat lock in tact. No leakage or active bleeding noted. Significantly tender to palpation of insertion site.  Skin:    General: Skin is warm and dry.  Neurological:     Mental Status: She is alert. Mental status is at baseline.     Imaging: 10/02/2019 Pelvis Complete  Result Date: 08/05/2019 CLINICAL DATA:  39 year old female with enlarged and inflamed appearing left adnexa on CT Abdomen and Pelvis today. EXAM: TRANSABDOMINAL ULTRASOUND OF PELVIS DOPPLER ULTRASOUND OF OVARIES TECHNIQUE: Transabdominal ultrasound examination of the pelvis was performed including evaluation of the uterus, ovaries, adnexal regions, and pelvic cul-de-sac. Color and duplex Doppler ultrasound was utilized to evaluate blood flow to the ovaries. COMPARISON:  CT Abdomen and Pelvis 1745 hours. Recent CT Abdomen and Pelvis and pelvis ultrasound 08/02/2019. FINDINGS: Uterus Measurements: 9.6 x 5 cm. Partially obscured by bowel gas (image 31). No discrete uterine mass. Endometrium Thickness: 4 mm.  No focal abnormality visualized. Right ovary Measurements: 3.5 x 3.2 x 3.9 cm = volume: 23 mL. Simple appearing right ovarian cyst again noted (image 28). Left ovary Measurements: 6.2 x 4.9 x 6.1 cm = volume:  96 mL. Continued complex, multi cystic appearance of the left ovary/adnexa (image 35). But there is preserved tissue vascularity on color Doppler (image 5). Pulsed Doppler evaluation demonstrates normal low-resistance arterial and venous waveforms in both ovaries. Other: No pelvic free fluid. IMPRESSION: 1. No evidence of left ovarian torsion. Complex, multi-cystic appearance of the left ovary appears stable from the recent ultrasound on 08/02/2019. Although nonspecific, the increased regional inflammation on CT today  is suspicious for tubo-ovarian abscess. 2. No pelvic free fluid. Stable and negative right ovary and uterus. Electronically Signed   By: Odessa Fleming M.D.   On: 08/05/2019 21:58   CT ABDOMEN PELVIS W CONTRAST  Result Date: 08/05/2019 CLINICAL DATA:  Worsening abdominal pain and diarrhea. Diverticulitis. EXAM: CT ABDOMEN AND PELVIS WITH CONTRAST TECHNIQUE: Multidetector CT imaging of the abdomen and pelvis was performed using the standard protocol following bolus administration of intravenous contrast. CONTRAST:  OMNIPAQUE IOHEXOL 300 MG/ML  SOLN COMPARISON:  08/02/2019 FINDINGS: Lower Chest: No acute findings. Hepatobiliary: No hepatic masses identified. Mild diffuse hepatic steatosis. Gallbladder is unremarkable. No evidence of biliary ductal dilatation. Pancreas:  No mass or inflammatory changes. Spleen: Within normal limits in size and appearance. Adrenals/Urinary Tract: No masses identified. No evidence of ureteral calculi or hydronephrosis. Stomach/Bowel: Multi locular cystic lesion with surrounding inflammatory changes is seen in the left lower quadrant abutting the proximal sigmoid colon. There is only mild thickening of the adjacent proximal sigmoid colon. Vascular/Lymphatic: No pathologically enlarged lymph nodes. No abdominal aortic aneurysm. Reproductive: Increased size of a multilocular cystic lesion in the left adnexa with surrounding inflammatory changes. This abuts the proximal sigmoid colon but favors no adnexal origin. This currently measures 7.0 x 6.4 cm compared with 5.0 x 4.8 cm previously. Other:  None. Musculoskeletal:  No suspicious bone lesions identified. IMPRESSION: Increased size of 7 cm multilocular cystic lesion in the left adnexa with surrounding inflammatory changes, which abuts the proximal sigmoid colon. Differential diagnosis includes tubo-ovarian abscess, cystic ovarian mass with ovarian torsion, with diverticulitis with diverticular abscess considered less likely. Recommend  clinical correlation; consider pelvic ultrasound with Doppler for further evaluation. Electronically Signed   By: Danae Orleans M.D.   On: 08/05/2019 18:07   US PELVIC DOPPLER (TORSION R/O OR MASS ARTERIAL FLOW)  Result Date: 08/05/2019 CLINICAL DATA:  39 year old female with enlarged and inflamed appearing left adnexa on CT Abdomen and Pelvis today. EXAM: TRANSABDOMINAL ULTRASOUND OF PELVIS DOPPLER ULTRASOUND OF OVARIES TECHNIQUE: Transabdominal ultrasound examination of the pelvis was performed including evaluation of the uterus, ovaries, adnexal regions, and pelvic cul-de-sac. Color and duplex Doppler ultrasound was utilized to evaluate blood flow to the ovaries. COMPARISON:  CT Abdomen and Pelvis 1745 hours. Recent CT Abdomen and Pelvis and pelvis ultrasound 08/02/2019. FINDINGS: Uterus Measurements: 9.6 x 5 cm. Partially obscured by bowel gas (image 31). No discrete uterine mass. Endometrium Thickness: 4 mm.  No focal abnormality visualized. Right ovary Measurements: 3.5 x 3.2 x 3.9 cm = volume: 23 mL. Simple appearing right ovarian cyst again noted (image 28). Left ovary Measurements: 6.2 x 4.9 x 6.1 cm = volume: 96 mL. Continued complex, multi cystic appearance of the left ovary/adnexa (image 35). But there is preserved tissue vascularity on color Doppler (image 5). Pulsed Doppler evaluation demonstrates normal low-resistance arterial and venous waveforms in both ovaries. Other: No pelvic free fluid. IMPRESSION: 1. No evidence of left ovarian torsion. Complex, multi-cystic appearance of the left ovary appears stable from the recent ultrasound on 08/02/2019. Although nonspecific, the increased  regional inflammation on CT today is suspicious for tubo-ovarian abscess. 2. No pelvic free fluid. Stable and negative right ovary and uterus. Electronically Signed   By: Genevie Ann M.D.   On: 08/05/2019 21:58   CT IMAGE GUIDED FLUID DRAIN BY CATHETER  Result Date: 08/07/2019 INDICATION: Tubo-ovarian abscess. Please  perform CT-guided percutaneous drainage catheter placement for infection source control purposes. EXAM: CT IMAGE GUIDED FLUID DRAIN BY CATHETER COMPARISON:  CT abdomen and pelvis-08/05/2019; pelvic ultrasound-08/05/2019 MEDICATIONS: The patient is currently admitted to the hospital and receiving intravenous antibiotics. The antibiotics were administered within an appropriate time frame prior to the initiation of the procedure. ANESTHESIA/SEDATION: Moderate (conscious) sedation was employed during this procedure. A total of Versed 2 mg and Fentanyl 100 mcg was administered intravenously. Moderate Sedation Time: 14 minutes. The patient's level of consciousness and vital signs were monitored continuously by radiology nursing throughout the procedure under my direct supervision. CONTRAST:  None COMPLICATIONS: None immediate. PROCEDURE: Informed written consent was obtained from the patient after a discussion of the risks, benefits and alternatives to treatment. The patient was placed supine on the CT gantry and a pre procedural CT was performed re-demonstrating the known abscess/fluid collection within the left adnexa with dominant ill-defined component measuring approximately 7.3 x 5.7 cm (image 30, series 2). The procedure was planned. A timeout was performed prior to the initiation of the procedure. The skin overlying the left lower abdomen/pelvis was prepped and draped in the usual sterile fashion. The overlying soft tissues were anesthetized with 1% lidocaine with epinephrine. Appropriate trajectory was planned with the use of a 22 gauge spinal needle. An 18 gauge trocar needle was advanced into the abscess/fluid collection and a short Amplatz super stiff wire was coiled within the collection. Appropriate positioning was confirmed with a limited CT scan. The tract was serially dilated allowing placement of a 10 [place] all-purpose drainage catheter. Appropriate positioning was confirmed with a limited postprocedural  CT scan. Approximately 35 ml of purulent fluid was aspirated. The tube was connected to a drainage bag and sutured in place. A dressing was placed. The patient tolerated the procedure well without immediate post procedural complication. IMPRESSION: Successful CT guided placement of a 10 French all purpose drain catheter into the left-sided tubo-ovarian abscess with aspiration of 35 mL of purulent fluid. Samples were sent to the laboratory as requested by the ordering clinical team. Electronically Signed   By: Sandi Mariscal M.D.   On: 08/07/2019 10:13    Labs:  CBC: Recent Labs    08/02/19 1116 08/05/19 1607 08/06/19 0925 08/08/19 0157  WBC 9.1 9.3 15.6* 10.9*  HGB 12.4 12.4 10.5* 10.9*  HCT 38.8 38.1 32.3* 33.4*  PLT 184 206 208 237    COAGS: No results for input(s): INR, APTT in the last 8760 hours.  BMP: Recent Labs    08/05/19 1607 08/06/19 1326 08/07/19 0307 08/08/19 0157  NA 131* 133* 135 136  K 2.7* 2.8* 3.1* 3.0*  CL 96* 94* 96* 95*  CO2 23 27 28 26   GLUCOSE 328* 194* 163* 140*  BUN 8 <5* 6 <5*  CALCIUM 8.8* 8.6* 8.7* 8.3*  CREATININE 0.63 0.78 0.70 0.76  GFRNONAA >60 >60 >60 >60  GFRAA >60 >60 >60 >60    LIVER FUNCTION TESTS: Recent Labs    08/05/19 1607 08/06/19 1326 08/07/19 0307 08/08/19 0157  BILITOT 0.6 0.4 0.3 0.4  AST 17 14* 11* 13*  ALT 17 16 14 10   ALKPHOS 123 72 82 67  PROT 7.7 7.0 7.1 6.9  ALBUMIN 3.3* 2.6* 2.6* 2.6*    Assessment and Plan:  39 y/o F s/p LLQ TOA drain placed 08/07/19 by Dr. Grace Isaac seen today for drain follow up. She reports significant pain during and post procedure which she is very displeased about, she requests that I note in her chart that she does not want to see any of the IR physicians and would like any further drain needs to be handled by Dr. Romie Levee with general surgery. We discussed that general surgery is not currently seeing her during this admission as she has no surgical needs at the moment, however I am  happy to note her request. She is agreeable to allow the IR PAs to flush the drain/follow output. She has not yet decided if she would be agreeable to following up in our clinic but she will think about it. Of note she previously had LLQ drain by Dr. Miles Costain on 02/09/19 and followed up in our clinic without issue at that time.  Per I/O 60 cc OP in last 24H, drain flushes and aspirates easily with some pain on flushing, output is serosanguineous currently. Afebrile, Tmax 102.5, preliminary culture shows abundant gram negative rods. She is currently on Cefotan + Doxycycline + Flagyl per primary team.  Continue TID flushes with 5 cc NS, record output Qshift, dressing changes QD or PRN if soiled, call IR if difficulty flushing or sudden decrease in output.   Plan for repeat CT/possible injection once output <10 mL/QD (excluding flush) - if patient is to be discharged prior to this occurring they will need to follow up with IR clinic as an outpatient typically 10-14 days post d/c, IR scheduler will call patient with date/time. Upon d/c drain to be flushed QD with 5 cc NS (will need rx from primary team at d/c), record output QD, dressing changes every 2-3 days or sooner if soiled.   IR will continue to follow - please call with questions or concerns.  Electronically Signed: Villa Herb, PA-C 08/08/2019, 2:50 PM   I spent a total of 15 Minutes at the the patient's bedside AND on the patient's hospital floor or unit, greater than 50% of which was counseling/coordinating care for LLQ drain follow up.

## 2019-08-08 NOTE — Progress Notes (Signed)
Subjective: Patient reports incisional pain and tolerating PO.    Objective: I have reviewed patient's vital signs, intake and output and medications.  General: alert and cooperative Resp: clear to auscultation bilaterally Cardio: regular rate and rhythm GI: soft, non-tender; bowel sounds normal; no masses,  no organomegaly and drain with serous drainage Extremities: Homans sign is negative, no sign of DVT   Assessment/Plan: TOA s/p IR draining coninue abx and draining Pt pain better controlled  LOS: 3 days    Claire Bridge A Kaydan Wilhoite 08/08/2019, 12:14 PM

## 2019-08-08 NOTE — Plan of Care (Signed)

## 2019-08-09 LAB — COMPREHENSIVE METABOLIC PANEL
ALT: 13 U/L (ref 0–44)
AST: 12 U/L — ABNORMAL LOW (ref 15–41)
Albumin: 2.4 g/dL — ABNORMAL LOW (ref 3.5–5.0)
Alkaline Phosphatase: 60 U/L (ref 38–126)
Anion gap: 11 (ref 5–15)
BUN: 6 mg/dL (ref 6–20)
CO2: 29 mmol/L (ref 22–32)
Calcium: 8.5 mg/dL — ABNORMAL LOW (ref 8.9–10.3)
Chloride: 95 mmol/L — ABNORMAL LOW (ref 98–111)
Creatinine, Ser: 0.74 mg/dL (ref 0.44–1.00)
GFR calc Af Amer: >60 mL/min (ref >60)
GFR calc non Af Amer: >60 mL/min (ref >60)
Glucose, Bld: 202 mg/dL — ABNORMAL HIGH (ref 70–99)
Potassium: 3.1 mmol/L — ABNORMAL LOW (ref 3.5–5.1)
Sodium: 135 mmol/L (ref 135–145)
Total Bilirubin: 0.5 mg/dL (ref 0.3–1.2)
Total Protein: 6.8 g/dL (ref 6.5–8.1)

## 2019-08-09 LAB — GLUCOSE, CAPILLARY
Glucose-Capillary: 167 mg/dL — ABNORMAL HIGH (ref 70–99)
Glucose-Capillary: 110 mg/dL — ABNORMAL HIGH (ref 70–99)
Glucose-Capillary: 169 mg/dL — ABNORMAL HIGH (ref 70–99)
Glucose-Capillary: 128 mg/dL — ABNORMAL HIGH (ref 70–99)

## 2019-08-09 MED ORDER — METRONIDAZOLE 500 MG PO TABS
500.0000 mg | ORAL_TABLET | Freq: Two times a day (BID) | ORAL | Status: DC
  Administered 2019-08-09 – 2019-08-10 (×2): 500 mg via ORAL
  Filled 2019-08-09 (×2): qty 1

## 2019-08-09 MED ORDER — SIMETHICONE 80 MG PO CHEW
80.0000 mg | CHEWABLE_TABLET | Freq: Four times a day (QID) | ORAL | Status: DC | PRN
  Administered 2019-08-09: 80 mg via ORAL
  Filled 2019-08-09: qty 1

## 2019-08-09 MED ORDER — DOXYCYCLINE HYCLATE 100 MG PO TABS
100.0000 mg | ORAL_TABLET | Freq: Two times a day (BID) | ORAL | Status: DC
  Administered 2019-08-09 – 2019-08-10 (×2): 100 mg via ORAL
  Filled 2019-08-09 (×2): qty 1

## 2019-08-09 NOTE — Progress Notes (Signed)
Subjective: Patient reports tolerating PO.    Objective: I have reviewed patient's vital signs, intake and output, medications and labs.  General: alert and cooperative GI: soft, non-tender; bowel sounds normal; no masses,  no organomegaly Neg for Homans sign B   Assessment/Plan: TOA Pt afebrile overnight.   Continue abx BS  Are elevated pt eating food outside of the hospital Pt with some gas pain will give simethecone Tentative DC today  LOS: 4 days    Yvan Dority A Sherina Stammer 08/09/2019, 11:35 AM

## 2019-08-09 NOTE — Progress Notes (Signed)
Patient's IV infiltrated,refused IV restart.Would like to wait for  MD for possible switching antibiotics to oral.

## 2019-08-09 NOTE — Progress Notes (Signed)
Referring Physician(s): Hoover Browns  Supervising Physician: Ruel Favors  Patient Status:  Robyn [name] - In-pt  Chief Complaint: Follow up TOA s/p drain placement 08/08/19 by Dr. Grace Isaac  Subjective:  Patient sleeping upon entry to room, arouses easily to verbal cues. She reports pain has improved since yesterday and she will probably go home tomorrow. She is wondering how long the drain will need to stay in. She remembers how to take care of the drain from the last time.  Allergies: Patient has no known allergies.  Medications: Prior to Admission medications   Medication Sig Start Date End Date Taking? Authorizing Provider  hydrochlorothiazide (HYDRODIURIL) 25 MG tablet Take 25 mg by mouth daily. 01/24/19  Yes [provider]  losartan (COZAAR) 50 MG tablet Take 50 mg by mouth daily. 01/24/19  Yes [provider]  metFORMIN (GLUCOPHAGE-XR) 500 MG 24 hr tablet Take 1,000 mg by mouth 2 (two) times daily. 12/23/18  Yes [provider]  NOVOLIN 70/30 RELION (70-30) 100 UNIT/ML injection Inject 15 Units into the skin 2 (two) times daily between meals as needed (if blood sugar is high).  07/17/19  Yes [provider]     Vital Signs: BP (!) 105/47 (BP Location: Left Arm)   Pulse 82   Temp 98.9 F (37.2 C) (Oral)   Resp 18   Ht 5\' 8"  (1.727 m)   Wt 294 lb 15.6 oz (133.8 kg)   LMP 08/02/2019   SpO2 96%   BMI 44.85 kg/m   Physical Exam Vitals and nursing note reviewed.  Constitutional:      General: She is not in acute distress. HENT:     Head: Normocephalic.  Cardiovascular:     Rate and Rhythm: Normal rate.  Pulmonary:     Effort: Pulmonary effort is normal.  Abdominal:     General: There is no distension.     Palpations: Abdomen is soft.     Tenderness: There is abdominal tenderness (mild TTP over drain insertion site).     Comments: (+) LLQ drain to gravity with ~15 cc serosanguineous with small clots, patient reports minimal OP.  Flushes/aspirates easily. Insertion site unremarkable.  Skin:    General: Skin is warm and dry.  Neurological:     Mental Status: She is alert. Mental status is at baseline.  Psychiatric:        Mood and Affect: Mood normal.        Behavior: Behavior normal.        Thought Content: Thought content normal.        Judgment: Judgment normal.     Imaging: 10/02/2019 Pelvis Complete  Result Date: 08/05/2019 CLINICAL DATA:  39 year old female with enlarged and inflamed appearing left adnexa on CT Abdomen and Pelvis today. EXAM: TRANSABDOMINAL ULTRASOUND OF PELVIS DOPPLER ULTRASOUND OF OVARIES TECHNIQUE: Transabdominal ultrasound examination of the pelvis was performed including evaluation of the uterus, ovaries, adnexal regions, and pelvic cul-de-sac. Color and duplex Doppler ultrasound was utilized to evaluate blood flow to the ovaries. COMPARISON:  CT Abdomen and Pelvis 1745 hours. Recent CT Abdomen and Pelvis and pelvis ultrasound 08/02/2019. FINDINGS: Uterus Measurements: 9.6 x 5 cm. Partially obscured by bowel gas (image 31). No discrete uterine mass. Endometrium Thickness: 4 mm.  No focal abnormality visualized. Right ovary Measurements: 3.5 x 3.2 x 3.9 cm = volume: 23 mL. Simple appearing right ovarian cyst again noted (image 28). Left ovary Measurements: 6.2 x 4.9 x 6.1 cm = volume: 96 mL. Continued complex, multi  cystic appearance of the left ovary/adnexa (image 35). But there is preserved tissue vascularity on color Doppler (image 5). Pulsed Doppler evaluation demonstrates normal low-resistance arterial and venous waveforms in both ovaries. Other: No pelvic free fluid. IMPRESSION: 1. No evidence of left ovarian torsion. Complex, multi-cystic appearance of the left ovary appears stable from the recent ultrasound on 08/02/2019. Although nonspecific, the increased regional inflammation on CT today is suspicious for tubo-ovarian abscess. 2. No pelvic free fluid. Stable and negative right ovary and uterus.  Electronically Signed   By: Odessa Fleming M.D.   On: 08/05/2019 21:58   CT ABDOMEN PELVIS W CONTRAST  Result Date: 08/05/2019 CLINICAL DATA:  Worsening abdominal pain and diarrhea. Diverticulitis. EXAM: CT ABDOMEN AND PELVIS WITH CONTRAST TECHNIQUE: Multidetector CT imaging of the abdomen and pelvis was performed using the standard protocol following bolus administration of intravenous contrast. CONTRAST:  OMNIPAQUE IOHEXOL 300 MG/ML  SOLN COMPARISON:  08/02/2019 FINDINGS: Lower Chest: No acute findings. Hepatobiliary: No hepatic masses identified. Mild diffuse hepatic steatosis. Gallbladder is unremarkable. No evidence of biliary ductal dilatation. Pancreas:  No mass or inflammatory changes. Spleen: Within normal limits in size and appearance. Adrenals/Urinary Tract: No masses identified. No evidence of ureteral calculi or hydronephrosis. Stomach/Bowel: Multi locular cystic lesion with surrounding inflammatory changes is seen in the left lower quadrant abutting the proximal sigmoid colon. There is only mild thickening of the adjacent proximal sigmoid colon. Vascular/Lymphatic: No pathologically enlarged lymph nodes. No abdominal aortic aneurysm. Reproductive: Increased size of a multilocular cystic lesion in the left adnexa with surrounding inflammatory changes. This abuts the proximal sigmoid colon but favors no adnexal origin. This currently measures 7.0 x 6.4 cm compared with 5.0 x 4.8 cm previously. Other:  None. Musculoskeletal:  No suspicious bone lesions identified. IMPRESSION: Increased size of 7 cm multilocular cystic lesion in the left adnexa with surrounding inflammatory changes, which abuts the proximal sigmoid colon. Differential diagnosis includes tubo-ovarian abscess, cystic ovarian mass with ovarian torsion, with diverticulitis with diverticular abscess considered less likely. Recommend clinical correlation; consider pelvic ultrasound with Doppler for further evaluation. Electronically Signed    By: Danae Orleans M.D.   On: 08/05/2019 18:07   US PELVIC DOPPLER (TORSION R/O OR MASS ARTERIAL FLOW)  Result Date: 08/05/2019 CLINICAL DATA:  39 year old female with enlarged and inflamed appearing left adnexa on CT Abdomen and Pelvis today. EXAM: TRANSABDOMINAL ULTRASOUND OF PELVIS DOPPLER ULTRASOUND OF OVARIES TECHNIQUE: Transabdominal ultrasound examination of the pelvis was performed including evaluation of the uterus, ovaries, adnexal regions, and pelvic cul-de-sac. Color and duplex Doppler ultrasound was utilized to evaluate blood flow to the ovaries. COMPARISON:  CT Abdomen and Pelvis 1745 hours. Recent CT Abdomen and Pelvis and pelvis ultrasound 08/02/2019. FINDINGS: Uterus Measurements: 9.6 x 5 cm. Partially obscured by bowel gas (image 31). No discrete uterine mass. Endometrium Thickness: 4 mm.  No focal abnormality visualized. Right ovary Measurements: 3.5 x 3.2 x 3.9 cm = volume: 23 mL. Simple appearing right ovarian cyst again noted (image 28). Left ovary Measurements: 6.2 x 4.9 x 6.1 cm = volume: 96 mL. Continued complex, multi cystic appearance of the left ovary/adnexa (image 35). But there is preserved tissue vascularity on color Doppler (image 5). Pulsed Doppler evaluation demonstrates normal low-resistance arterial and venous waveforms in both ovaries. Other: No pelvic free fluid. IMPRESSION: 1. No evidence of left ovarian torsion. Complex, multi-cystic appearance of the left ovary appears stable from the recent ultrasound on 08/02/2019. Although nonspecific, the increased regional inflammation on CT today  is suspicious for tubo-ovarian abscess. 2. No pelvic free fluid. Stable and negative right ovary and uterus. Electronically Signed   By: Odessa Fleming M.D.   On: 08/05/2019 21:58   CT IMAGE GUIDED FLUID DRAIN BY CATHETER  Result Date: 08/07/2019 INDICATION: Tubo-ovarian abscess. Please perform CT-guided percutaneous drainage catheter placement for infection source control purposes. EXAM: CT  IMAGE GUIDED FLUID DRAIN BY CATHETER COMPARISON:  CT abdomen and pelvis-08/05/2019; pelvic ultrasound-08/05/2019 MEDICATIONS: The patient is currently admitted to the hospital and receiving intravenous antibiotics. The antibiotics were administered within an appropriate time frame prior to the initiation of the procedure. ANESTHESIA/SEDATION: Moderate (conscious) sedation was employed during this procedure. A total of Versed 2 mg and Fentanyl 100 mcg was administered intravenously. Moderate Sedation Time: 14 minutes. The patient's level of consciousness and vital signs were monitored continuously by radiology nursing throughout the procedure under my direct supervision. CONTRAST:  None COMPLICATIONS: None immediate. PROCEDURE: Informed written consent was obtained from the patient after a discussion of the risks, benefits and alternatives to treatment. The patient was placed supine on the CT gantry and a pre procedural CT was performed re-demonstrating the known abscess/fluid collection within the left adnexa with dominant ill-defined component measuring approximately 7.3 x 5.7 cm (image 30, series 2). The procedure was planned. A timeout was performed prior to the initiation of the procedure. The skin overlying the left lower abdomen/pelvis was prepped and draped in the usual sterile fashion. The overlying soft tissues were anesthetized with 1% lidocaine with epinephrine. Appropriate trajectory was planned with the use of a 22 gauge spinal needle. An 18 gauge trocar needle was advanced into the abscess/fluid collection and a short Amplatz super stiff wire was coiled within the collection. Appropriate positioning was confirmed with a limited CT scan. The tract was serially dilated allowing placement of a 10 [place] all-purpose drainage catheter. Appropriate positioning was confirmed with a limited postprocedural CT scan. Approximately 35 ml of purulent fluid was aspirated. The tube was connected to a drainage bag and  sutured in place. A dressing was placed. The patient tolerated the procedure well without immediate post procedural complication. IMPRESSION: Successful CT guided placement of a 10 French all purpose drain catheter into the left-sided tubo-ovarian abscess with aspiration of 35 mL of purulent fluid. Samples were sent to the laboratory as requested by the ordering clinical team. Electronically Signed   By: Simonne Come M.D.   On: 08/07/2019 10:13    Labs:  CBC: Recent Labs    08/02/19 1116 08/05/19 1607 08/06/19 0925 08/08/19 0157  WBC 9.1 9.3 15.6* 10.9*  HGB 12.4 12.4 10.5* 10.9*  HCT 38.8 38.1 32.3* 33.4*  PLT 184 206 208 237    COAGS: No results for input(s): INR, APTT in the last 8760 hours.  BMP: Recent Labs    08/06/19 1326 08/07/19 0307 08/08/19 0157 08/09/19 0218  NA 133* 135 136 135  K 2.8* 3.1* 3.0* 3.1*  CL 94* 96* 95* 95*  CO2 27 28 26 29   GLUCOSE 194* 163* 140* 202*  BUN <5* 6 <5* 6  CALCIUM 8.6* 8.7* 8.3* 8.5*  CREATININE 0.78 0.70 0.76 0.74  GFRNONAA >60 >60 >60 >60  GFRAA >60 >60 >60 >60    LIVER FUNCTION TESTS: Recent Labs    08/06/19 1326 08/07/19 0307 08/08/19 0157 08/09/19 0218  BILITOT 0.4 0.3 0.4 0.5  AST 14* 11* 13* 12*  ALT 16 14 10 13   ALKPHOS 72 82 67 60  PROT 7.0 7.1 6.9 6.8  ALBUMIN 2.6* 2.6* 2.6* 2.4*    Assessment and Plan:  39 y/o F with history of TOA s/p drain placement 08/17/19 in IR seen today for drain follow up.   No output has been recorded in I/O since 3/11-3/12 shift when output noted to be 60 cc, on exam today there is about 10-15 cc of serosanguineous output in the gravity bag. Drain aspirates/flushes easily, insertion site is unremarkable. Pain has improved today per patient.  Per patient and chart review likely for d/c this weekend - patient will need to flush the drain QD with 5 cc NS, record output QD, dressing changes every 2-3 days or earlier when soiled. She will follow up at IR clinic in 10-14 days (order has  been placed for this and IR scheduler will call with appointment date/time). I have given her some flushes, dressing supplies and rx for flushes should she need more. I have also placed written drain care instructions and our contact information in her d/c summary. She states understanding of the above and knows to call us with any questions or concerns prior to her appointment.  Please call IR with any questions or concerns.   Electronically Signed: Joaquim Nam, PA-C 08/09/2019, 12:58 PM   I spent a total of 25 Minutes at the the patient's bedside AND on the patient's hospital floor or unit, greater than 50% of which was counseling/coordinating care for TOA drain follow up.

## 2019-08-09 NOTE — Plan of Care (Signed)
  Problem: Education: Goal: Knowledge of General Education information will improve Description Including pain rating scale, medication(s)/side effects and non-pharmacologic comfort measures Outcome: Progressing   

## 2019-08-10 ENCOUNTER — Encounter (HOSPITAL_COMMUNITY): Payer: Self-pay | Admitting: Surgery

## 2019-08-10 LAB — COMPREHENSIVE METABOLIC PANEL
ALT: 13 U/L (ref 0–44)
AST: 17 U/L (ref 15–41)
Albumin: 2.4 g/dL — ABNORMAL LOW (ref 3.5–5.0)
Alkaline Phosphatase: 64 U/L (ref 38–126)
Anion gap: 12 (ref 5–15)
BUN: 9 mg/dL (ref 6–20)
CO2: 28 mmol/L (ref 22–32)
Calcium: 9.2 mg/dL (ref 8.9–10.3)
Chloride: 98 mmol/L (ref 98–111)
Creatinine, Ser: 0.59 mg/dL (ref 0.44–1.00)
GFR calc Af Amer: >60 mL/min (ref >60)
GFR calc non Af Amer: >60 mL/min (ref >60)
Glucose, Bld: 185 mg/dL — ABNORMAL HIGH (ref 70–99)
Potassium: 3.5 mmol/L (ref 3.5–5.1)
Sodium: 138 mmol/L (ref 135–145)
Total Bilirubin: 0.5 mg/dL (ref 0.3–1.2)
Total Protein: 7.1 g/dL (ref 6.5–8.1)

## 2019-08-10 LAB — CULTURE, BLOOD (ROUTINE X 2)
Culture: NO GROWTH
Culture: NO GROWTH
Special Requests: ADEQUATE
Special Requests: ADEQUATE

## 2019-08-10 LAB — GLUCOSE, CAPILLARY
Glucose-Capillary: 170 mg/dL — ABNORMAL HIGH (ref 70–99)
Glucose-Capillary: 133 mg/dL — ABNORMAL HIGH (ref 70–99)
Glucose-Capillary: 143 mg/dL — ABNORMAL HIGH (ref 70–99)

## 2019-08-10 MED ORDER — METFORMIN HCL ER 500 MG PO TB24
1000.0000 mg | ORAL_TABLET | Freq: Two times a day (BID) | ORAL | Status: DC
  Filled 2019-08-10: qty 2

## 2019-08-10 MED ORDER — SULFAMETHOXAZOLE-TRIMETHOPRIM 800-160 MG PO TABS: 1.0000 | ORAL_TABLET | Freq: Two times a day (BID) | ORAL | 0 refills | Status: AC

## 2019-08-10 NOTE — Discharge Summary (Signed)
Physician Discharge Summary  Patient ID: Robyn [name] MRN: [id number] DOB/AGE: May 25, 1981 39 y.o.  Admit date: 08/05/2019 Discharge date: 08/10/2019  Admission Diagnoses: TOA T2DM CHTN Abdominal pain  Discharge Diagnoses:  Active Problems:   TOA (tubo-ovarian abscess)   Tubo-ovarian abscess Improving TOA T2DM CHTN Hemorrhoids  Discharged Condition: stable  Hospital Course: Pt admitted and found to have left TOA with E-Coli sensitive to most antibiotics being sent home on bactrim DS for total of 14 days of abx.  Pt had drain placed by IR and has f/u with them already scheduled for drain removal which has minimal output.  They are comfortable with pts home care of drain as she was previously discharged with one.  Long term planning discussed and pt will f/u with me in a couple of months with a repeat CT scan of abdomen and pelvis, referral to Dr. Everardo All, endocrinologist at [facility], for DM control, referral to GI d/t pt with hemorrhoids and c/o dark blood in stool.  Hgb stable.  C-diff sent prior to discharge.  Hypokalemia resolved.  Pt afebrile > 48hrs.  Pt may rtw on Wednesday (says she works from home).  Consults: IR  Significant Diagnostic Studies: CT scan and labs  Treatments: IVF, IV antibiotics, drain placement, mgmt of DM and HTN  Discharge Exam: Blood pressure 113/67, pulse 75, temperature 98.7 F (37.1 C), temperature source Oral, resp. rate 16, height 5\' 8"  (1.727 m), weight 133.8 kg, last menstrual period 08/02/2019, SpO2 97 %. General appearance: alert and no distress Resp: clear to auscultation bilaterally Cardio: regular rate and rhythm GI: soft, non-tender; bowel sounds normal; no masses,  no organomegaly Extremities: no calf tenderness drain in place to gravity  Disposition: Discharge disposition: 01-Home or Self Care        Allergies as of 08/10/2019   No Known Allergies     Medication List    TAKE these medications   hydrochlorothiazide 25 MG  tablet Commonly known as: HYDRODIURIL Take 25 mg by mouth daily.   losartan 50 MG tablet Commonly known as: COZAAR Take 50 mg by mouth daily.   metFORMIN 500 MG 24 hr tablet Commonly known as: GLUCOPHAGE-XR Take 1,000 mg by mouth 2 (two) times daily.   NovoLIN 70/30 ReliOn (70-30) 100 UNIT/ML injection Generic drug: insulin NPH-regular Human Inject 15 Units into the skin 2 (two) times daily between meals as needed (if blood sugar is high).   sulfamethoxazole-trimethoprim 800-160 MG tablet Commonly known as: BACTRIM DS Take 1 tablet by mouth 2 (two) times daily for 10 days.      Follow-up Information    07-17-1971, MD Follow up.   Specialties: Interventional Radiology, Radiology Why: IR scheduler will call you with appointment date/time (typically 10-14 days after you are discharged). Please call with any questions or concerns before your appointment. Contact information: [address] [place] [address]           Signed: [phone] 08/10/2019, 12:04 PM

## 2019-08-10 NOTE — Discharge Planning (Signed)
Patient discharged home in stable condition. Verbalizes understanding of all discharge instructions, including drain flushes,  home medications and follow up appointments.

## 2019-08-11 ENCOUNTER — Other Ambulatory Visit: Payer: Self-pay | Admitting: Obstetrics and Gynecology

## 2019-08-11 DIAGNOSIS — N7093 Salpingitis and oophoritis, unspecified: Secondary | ICD-10-CM

## 2019-08-11 LAB — AEROBIC/ANAEROBIC CULTURE W GRAM STAIN (SURGICAL/DEEP WOUND): Special Requests: NORMAL

## 2019-08-13 LAB — CULTURE, BLOOD (ROUTINE X 2)
Culture: NO GROWTH
Culture: NO GROWTH
Special Requests: ADEQUATE
Special Requests: ADEQUATE

## 2019-08-21 ENCOUNTER — Ambulatory Visit
Admission: RE | Admit: 2019-08-21 | Discharge: 2019-08-21 | Disposition: A | Discharge status: Home / Self Care | Payer: Medicaid Other | Source: Ambulatory Visit | Attending: Obstetrics and Gynecology | Admitting: Obstetrics and Gynecology

## 2019-08-21 ENCOUNTER — Encounter: Payer: Self-pay | Admitting: *Deleted

## 2019-08-21 ENCOUNTER — Ambulatory Visit
Admission: RE | Admit: 2019-08-21 | Discharge: 2019-08-21 | Disposition: A | Discharge status: Home / Self Care | Payer: Medicaid Other | Source: Ambulatory Visit | Attending: Physician Assistant | Admitting: Physician Assistant

## 2019-08-21 DIAGNOSIS — N7093 Salpingitis and oophoritis, unspecified: Secondary | ICD-10-CM

## 2019-08-21 HISTORY — PX: IR RADIOLOGIST EVAL & MGMT: IMG5224

## 2019-08-21 MED ORDER — IOPAMIDOL (ISOVUE-300) INJECTION 61%
125.0000 mL | Freq: Once | INTRAVENOUS | Status: AC | PRN
  Administered 2019-08-21: 125 mL via INTRAVENOUS

## 2019-08-21 NOTE — Progress Notes (Signed)
Referring Physician(s): DR Everett Graff  Chief Complaint: The patient is seen in follow up today s/p placement of a 49 French all purpose drain catheter into the left-sided tubo-ovarian abscess 08/07/19  History of present illness;  Hx previous tubo ovarian abscess with drain placement 02/09/2019. Recurrence and new drain placed 08/07/19  Pt is here today for evaluation/CT and possible removal of drain Denies pain; bleeding Denies fever or chills OP has been minimal for several days Flushes 5 cc sterile saline once daily  Finishes antibiotic today   Past Medical History:  Diagnosis Date  . Anxiety    h/o panic attacks  . Diabetes mellitus without complication (HCC)    diet controlled   . Diverticulosis 02/16/2019  . Herpes   . Hypertension   . Left ovarian cyst 11/29/2011  . PCOS (polycystic ovarian syndrome)   . Pregnancy induced hypertension   . Shortness of breath dyspnea    with exertion and while pregnant  . Smoker 11/29/2011    Past Surgical History:  Procedure Laterality Date  . CERVICAL CERCLAGE N/A 10/09/2014   Procedure: CERCLAGE CERVICAL;  Surgeon: Everett Graff, MD;  Location: [facility];  Service: Gynecology;  Laterality: N/A;  . CESAREAN SECTION     C/S x 2  . CESAREAN SECTION WITH BILATERAL TUBAL LIGATION Bilateral 03/23/2015   Procedure: CESAREAN SECTION WITH BILATERAL TUBAL LIGATION;  Surgeon: Everett Graff, MD;  Location: [facility];  Service: Obstetrics;  Laterality: Bilateral;  Amt OK'd to move 02/25/2015  . FLEXIBLE SIGMOIDOSCOPY N/A 05/08/2019   Procedure: FLEXIBLE SIGMOIDOSCOPY;  Surgeon: Leighton Ruff, MD;  Location: [facility];  Service: Endoscopy;  Laterality: N/A;  . IR RADIOLOGIST EVAL & MGMT  02/20/2019  . IR RADIOLOGIST EVAL & MGMT  03/06/2019  . WISDOM TOOTH EXTRACTION      Allergies: Patient has no known allergies.  Medications: Prior to Admission medications   Medication Sig Start Date End Date Taking? Authorizing Provider    hydrochlorothiazide (HYDRODIURIL) 25 MG tablet Take 25 mg by mouth daily. 01/24/19   [provider]  losartan (COZAAR) 50 MG tablet Take 50 mg by mouth daily. 01/24/19   [provider]  metFORMIN (GLUCOPHAGE-XR) 500 MG 24 hr tablet Take 1,000 mg by mouth 2 (two) times daily. 12/23/18   [provider]  NOVOLIN 70/30 RELION (70-30) 100 UNIT/ML injection Inject 15 Units into the skin 2 (two) times daily between meals as needed (if blood sugar is high).  07/17/19   [provider]     Family History  Problem Relation Age of Onset  . Hypertension Mother   . Diabetes Father   . Stroke Father   . Cancer Maternal Grandmother   . Cancer Maternal Uncle     Social History   Socioeconomic History  . Marital status: Married    Spouse name: Not on file  . Number of children: Not on file  . Years of education: Not on file  . Highest education level: Not on file  Occupational History  . Not on file  Tobacco Use  . Smoking status: Current Every Day Smoker    Packs/day: 0.25    Years: 13.00    Pack years: 3.25    Types: Cigarettes  . Smokeless tobacco: Never Used  Substance and Sexual Activity  . Alcohol use: Yes    Comment: socially and rarely  . Drug use: No  . Sexual activity: Never    Partners: Male    Birth control/protection: None  Other  Topics Concern  . Not on file  Social History Narrative  . Not on file   Social Determinants of Health   Financial Resource Strain:   . Difficulty of Paying Living Expenses:   Food Insecurity:   . Worried About Programme researcher, broadcasting/film/video in the Last Year:   . Barista in the Last Year:   Transportation Needs:   . Freight forwarder (Medical):   Marland Kitchen Lack of Transportation (Non-Medical):   Physical Activity:   . Days of Exercise per Week:   . Minutes of Exercise per Session:   Stress:   . Feeling of Stress :   Social Connections:   . Frequency of Communication with Friends and Family:   . Frequency  of Social Gatherings with Friends and Family:   . Attends Religious Services:   . Active Member of Clubs or Organizations:   . Attends Banker Meetings:   Marland Kitchen Marital Status:      Vital Signs: LMP 08/02/2019   Physical Exam Skin:    General: Skin is warm and dry.     Comments: Site is clean and dry NT no bleeding  no sign of infection  CT today read as collection resolved per Dr Loreta Ave     Imaging: No results found.  Labs:  CBC: Recent Labs    08/02/19 1116 08/05/19 1607 08/06/19 0925 08/08/19 0157  WBC 9.1 9.3 15.6* 10.9*  HGB 12.4 12.4 10.5* 10.9*  HCT 38.8 38.1 32.3* 33.4*  PLT 184 206 208 237    COAGS: No results for input(s): INR, APTT in the last 8760 hours.  BMP: Recent Labs    08/07/19 0307 08/08/19 0157 08/09/19 0218 08/10/19 0418  NA 135 136 135 138  K 3.1* 3.0* 3.1* 3.5  CL 96* 95* 95* 98  CO2 28 26 29 28   GLUCOSE 163* 140* 202* 185*  BUN 6 <5* 6 9  CALCIUM 8.7* 8.3* 8.5* 9.2  CREATININE 0.70 0.76 0.74 0.59  GFRNONAA >60 >60 >60 >60  GFRAA >60 >60 >60 >60    LIVER FUNCTION TESTS: Recent Labs    08/07/19 0307 08/08/19 0157 08/09/19 0218 08/10/19 0418  BILITOT 0.3 0.4 0.5 0.5  AST 11* 13* 12* 17  ALT 14 10 13 13   ALKPHOS 82 67 60 64  PROT 7.1 6.9 6.8 7.1  ALBUMIN 2.6* 2.6* 2.4* 2.4*    Assessment:  Drain removal per Dr 08/12/19 order New dressing placed Pt is to call Dr for follow up  Signed: Loreta Ave, PA-C 08/21/2019, 1:34 PM   Please refer to Dr. Robet Leu attestation of this note for management and plan.

## 2019-09-04 DIAGNOSIS — A6000 Herpesviral infection of urogenital system, unspecified: Secondary | ICD-10-CM | POA: Insufficient documentation

## 2019-09-12 DIAGNOSIS — Z91199 Patient's noncompliance with other medical treatment and regimen due to unspecified reason: Secondary | ICD-10-CM | POA: Insufficient documentation

## 2019-09-12 DIAGNOSIS — Z9189 Other specified personal risk factors, not elsewhere classified: Secondary | ICD-10-CM | POA: Insufficient documentation

## 2019-09-17 ENCOUNTER — Other Ambulatory Visit: Payer: Self-pay

## 2019-09-19 ENCOUNTER — Ambulatory Visit: Payer: Medicaid Other | Admitting: Internal Medicine

## 2019-10-21 ENCOUNTER — Other Ambulatory Visit: Payer: Self-pay | Admitting: Obstetrics and Gynecology

## 2019-10-21 DIAGNOSIS — N7093 Salpingitis and oophoritis, unspecified: Secondary | ICD-10-CM

## 2019-10-23 ENCOUNTER — Other Ambulatory Visit: Payer: Medicaid Other

## 2019-11-04 ENCOUNTER — Other Ambulatory Visit: Payer: Self-pay | Admitting: Obstetrics and Gynecology

## 2019-11-04 DIAGNOSIS — N7093 Salpingitis and oophoritis, unspecified: Secondary | ICD-10-CM

## 2020-01-09 ENCOUNTER — Emergency Department (HOSPITAL_BASED_OUTPATIENT_CLINIC_OR_DEPARTMENT_OTHER)
Admission: EM | Admit: 2020-01-09 | Discharge: 2020-01-09 | Disposition: A | Discharge status: Home / Self Care | Payer: Self-pay | Attending: Emergency Medicine | Admitting: Emergency Medicine

## 2020-01-09 ENCOUNTER — Encounter (HOSPITAL_BASED_OUTPATIENT_CLINIC_OR_DEPARTMENT_OTHER): Payer: Self-pay | Admitting: Emergency Medicine

## 2020-01-09 ENCOUNTER — Other Ambulatory Visit: Payer: Self-pay

## 2020-01-09 ENCOUNTER — Emergency Department (HOSPITAL_BASED_OUTPATIENT_CLINIC_OR_DEPARTMENT_OTHER): Payer: Self-pay

## 2020-01-09 DIAGNOSIS — F17210 Nicotine dependence, cigarettes, uncomplicated: Secondary | ICD-10-CM | POA: Insufficient documentation

## 2020-01-09 DIAGNOSIS — X501XXA Overexertion from prolonged static or awkward postures, initial encounter: Secondary | ICD-10-CM | POA: Insufficient documentation

## 2020-01-09 DIAGNOSIS — Y999 Unspecified external cause status: Secondary | ICD-10-CM | POA: Insufficient documentation

## 2020-01-09 DIAGNOSIS — I10 Essential (primary) hypertension: Secondary | ICD-10-CM | POA: Insufficient documentation

## 2020-01-09 DIAGNOSIS — Y929 Unspecified place or not applicable: Secondary | ICD-10-CM | POA: Insufficient documentation

## 2020-01-09 DIAGNOSIS — Z79899 Other long term (current) drug therapy: Secondary | ICD-10-CM | POA: Insufficient documentation

## 2020-01-09 DIAGNOSIS — Y9301 Activity, walking, marching and hiking: Secondary | ICD-10-CM | POA: Insufficient documentation

## 2020-01-09 DIAGNOSIS — E119 Type 2 diabetes mellitus without complications: Secondary | ICD-10-CM | POA: Insufficient documentation

## 2020-01-09 DIAGNOSIS — W010XXA Fall on same level from slipping, tripping and stumbling without subsequent striking against object, initial encounter: Secondary | ICD-10-CM | POA: Insufficient documentation

## 2020-01-09 DIAGNOSIS — Z7984 Long term (current) use of oral hypoglycemic drugs: Secondary | ICD-10-CM | POA: Insufficient documentation

## 2020-01-09 DIAGNOSIS — S8391XA Sprain of unspecified site of right knee, initial encounter: Secondary | ICD-10-CM | POA: Insufficient documentation

## 2020-01-09 MED ORDER — METHOCARBAMOL 500 MG PO TABS: 500.0000 mg | ORAL_TABLET | Freq: Two times a day (BID) | ORAL | 0 refills | Status: DC

## 2020-01-09 MED ORDER — MELOXICAM 7.5 MG PO TABS: 7.5000 mg | ORAL_TABLET | Freq: Every day | ORAL | 0 refills | Status: AC

## 2020-01-09 NOTE — Discharge Instructions (Addendum)
You can take 1000 mg of Tylenol.  Do not exceed 4000 mg of Tylenol a day.  Take Mobic as directed.  You should not take ibuprofen at the same time as Mobic.  Take Robaxin as prescribed. This medication will make you drowsy so do not drive or drink alcohol when taking it.  Follow the RICE (Rest, Ice, Compression, Elevation) protocol as directed.   He can wear the knee brace for support and stabilization.  Follow-up with referred orthopedic doctor.

## 2020-01-09 NOTE — ED Triage Notes (Signed)
Fell 2 weeks ago  Tripped  Over tree stumpnow rt knee is swelling and there is pain behind knee hurts to walk

## 2020-01-09 NOTE — ED Provider Notes (Signed)
[facility] EMERGENCY DEPARTMENT Provider Note   CSN: [id number] Arrival date & time: 01/09/20  1634     History Chief Complaint  Patient presents with  . Knee Pain    Robyn RubensteinRasherra [name] is a 39 y.o. female past history of hypertension, PCOS, diabetes who presents for evaluation of right knee pain that began after mechanical fall that occurred approximately 2 weeks ago.  Patient reports that she was walking and tripped over a tree stump and fell.  She is unsure if she twisted the knee or landed on the knee but reports that afterwards, she started having pain in her right knee.  She reports that over the last 2 weeks, the pain is continued and persisted.  She reports some swelling to the anterior aspect of her right knee.  She does report occasionally, she will have pain in the back part portion of her knee.  She has not noticed any overlying warmth or erythema.  She is still been able to ambulate but does report worsening pain with doing so.  She has not noted any numbness/weakness.  She denies any head injury, LOC. She denies any OCP use, recent immobilization, prior history of DVT/PE, recent surgery, leg swelling, or long travel.  The history is provided by the patient.       Past Medical History:  Diagnosis Date  . Anxiety    h/o panic attacks  . Diabetes mellitus without complication (HCC)    diet controlled   . Diverticulosis 02/16/2019  . Herpes   . Hypertension   . Left ovarian cyst 11/29/2011  . PCOS (polycystic ovarian syndrome)   . Pregnancy induced hypertension   . Shortness of breath dyspnea    with exertion and while pregnant  . Smoker 11/29/2011    Patient Active Problem List   Diagnosis Date Noted  . Tubo-ovarian abscess 08/06/2019  . TOA (tubo-ovarian abscess) 08/05/2019  . Colonic diverticular abscess 02/10/2019  . Diverticulitis 02/09/2019  . S/P repeat low transverse C-section 03/23/2015  . Hypertension - on Labetalol 200 mg po bid 11/22/2014  .  Diabetes mellitus type 2, uncontrolled, without complications -- dx'd in 2011 11/22/2014  . Hx of incompetent cervix, currently pregnant - cerclage placed on 10/09/14 by Dr. Su Hiltoberts 11/22/2014  . History of preterm delivery x 2 - weekly 17P injections - first injection at 16.5 wks 11/22/2014  . Elderly multigravida 11/22/2014  . PCOS (polycystic ovarian syndrome) 08/21/2014  . Herpes 08/21/2014  . ANA positive 08/21/2014  . Previous cesarean delivery, antepartum condition or complication x 2--previous vertical incision 08/21/2014  . Obesity 05/20/2012    Past Surgical History:  Procedure Laterality Date  . CERVICAL CERCLAGE N/A 10/09/2014   Procedure: CERCLAGE CERVICAL;  Surgeon: Osborn CohoAngela Roberts, MD;  Location: [facility];  Service: Gynecology;  Laterality: N/A;  . CESAREAN SECTION     C/S x 2  . CESAREAN SECTION WITH BILATERAL TUBAL LIGATION Bilateral 03/23/2015   Procedure: CESAREAN SECTION WITH BILATERAL TUBAL LIGATION;  Surgeon: Osborn CohoAngela Roberts, MD;  Location: [facility];  Service: Obstetrics;  Laterality: Bilateral;  Amt OK'd to move 02/25/2015  . FLEXIBLE SIGMOIDOSCOPY N/A 05/08/2019   Procedure: FLEXIBLE SIGMOIDOSCOPY;  Surgeon: Romie Leveehomas, Alicia, MD;  Location: [facility];  Service: Endoscopy;  Laterality: N/A;  . IR RADIOLOGIST EVAL & MGMT  02/20/2019  . IR RADIOLOGIST EVAL & MGMT  03/06/2019  . IR RADIOLOGIST EVAL & MGMT  08/21/2019  . WISDOM TOOTH EXTRACTION       OB History  Gravida  5   Para  3   Term      Preterm  3   AB  2   Living  3     SAB  1   TAB  1   Ectopic      Multiple  0   Live Births  3           Family History  Problem Relation Age of Onset  . Hypertension Mother   . Diabetes Father   . Stroke Father   . Cancer Maternal Grandmother   . Cancer Maternal Uncle     Social History   Tobacco Use  . Smoking status: Current Every Day Smoker    Packs/day: 0.25    Years: 13.00    Pack years: 3.25    Types: Cigarettes  . Smokeless tobacco:  Never Used  Vaping Use  . Vaping Use: Never used  Substance Use Topics  . Alcohol use: Yes    Comment: socially and rarely  . Drug use: No    Home Medications Prior to Admission medications   Medication Sig Start Date End Date Taking? Authorizing Provider  hydrochlorothiazide (HYDRODIURIL) 25 MG tablet Take 25 mg by mouth daily. 01/24/19   [provider]  losartan (COZAAR) 50 MG tablet Take 50 mg by mouth daily. 01/24/19   [provider]  meloxicam (MOBIC) 7.5 MG tablet Take 1 tablet (7.5 mg total) by mouth daily for 5 days. 01/09/20 01/14/20  Maxwell Caul, PA-C  metFORMIN (GLUCOPHAGE-XR) 500 MG 24 hr tablet Take 1,000 mg by mouth 2 (two) times daily. 12/23/18   [provider]  methocarbamol (ROBAXIN) 500 MG tablet Take 1 tablet (500 mg total) by mouth 2 (two) times daily. 01/09/20   Maxwell Caul, PA-C  NOVOLIN 70/30 RELION (70-30) 100 UNIT/ML injection Inject 15 Units into the skin 2 (two) times daily between meals as needed (if blood sugar is high).  07/17/19   [provider]    Allergies    Patient has no known allergies.  Review of Systems   Review of Systems  Constitutional: Negative for fever.  Musculoskeletal:       RLE pain  Neurological: Negative for weakness and numbness.  All other systems reviewed and are negative.   Physical Exam Updated Vital Signs BP (!) 163/86 (BP Location: Right Arm)   Pulse 74   Temp 99 F (37.2 C)   Resp 20   Ht 5\' 8"  (1.727 m)   Wt 127 kg   LMP 12/11/2019   SpO2 98%   BMI 42.57 kg/m   Physical Exam Vitals and nursing note reviewed.  Constitutional:      Appearance: She is well-developed.  HENT:     Head: Normocephalic and atraumatic.  Eyes:     General: No scleral icterus.       Right eye: No discharge.        Left eye: No discharge.     Conjunctiva/sclera: Conjunctivae normal.  Cardiovascular:     Pulses:          Dorsalis pedis pulses are 2+ on the right side and 2+ on the left  side.  Pulmonary:     Effort: Pulmonary effort is normal.  Musculoskeletal:     Comments: Tenderness palpation on anterior aspect of right knee with some mild overlying soft tissue swelling.  No overlying warmth, erythema.  She has mild tenderness noted to the lateral aspect as well. No tenderness noted posteriorly.  No deformity or swelling noted posteriorly.  No deformity or crepitus noted.  Flexion/extension intact.  She does have some subjective reports of pain with flexion.  She is able to hold the leg in extension off the bed against gravity.  Negative anterior and posterior drawer test.  No instability noted with varus or valgus stress.  No bony tenderness noted to right femur, right tib-fib, right ankle.  No tenderness palpation noted to left lower extremity.  No calf tenderness palpation noted bilaterally.  There is no swelling noted to the proximal/distal tib-fib, calf, ankle area.  Skin:    General: Skin is warm and dry.     Capillary Refill: Capillary refill takes less than 2 seconds.     Comments: Good distal cap refill.  RLE is not dusky in appearance or cool to touch.  Neurological:     Mental Status: She is alert.     Comments: Sensation intact along major nerve distributions of BUE  Psychiatric:        Speech: Speech normal.        Behavior: Behavior normal.     ED Results / Procedures / Treatments   Labs (all labs ordered are listed, but only abnormal results are displayed) Labs Reviewed - No data to display  EKG None  Radiology DG Knee Complete 4 Views Right  Result Date: 01/09/2020 CLINICAL DATA:  39 year old female with fall and trauma to the right knee. EXAM: RIGHT KNEE - COMPLETE 4+ VIEW COMPARISON:  None. FINDINGS: There is no acute fracture or dislocation. The bones are well mineralized. No arthritic changes. Probable small suprapatellar effusion. The soft tissues are unremarkable. IMPRESSION: No acute fracture or dislocation. Electronically Signed   By: Elgie Collard M.D.   On: 01/09/2020 18:02    Procedures Procedures (including critical care time)  Medications Ordered in ED Medications - No data to display  ED Course  I have reviewed the triage vital signs and the nursing notes.  Pertinent labs & imaging results that were available during my care of the patient were reviewed by me and considered in my medical decision making (see chart for details).    MDM Rules/Calculators/A&P                          39 y.o. F who presents for evaluation of right knee pain s/p mechanical fall.  Since then, she has had pain, swelling noted to the anterior aspect of the right knee as well as some pain noted to the posterior aspect.  On initially arrival, she is afebrile, nontoxic-appearing.  Vital signs are stable.  On exam, she has some tenderness palpation the anterior lateral aspect the right knee with some mild overlying soft tissue swelling.  No tenderness noted posteriorly.  No calf tenderness palpation.  Concern for sprain versus musculoskeletal injury versus tendon injury.  Also consider fracture though she has been ambulating on it for 2 weeks of low suspicion.  Plan for x-rays.  History/physical exam not concerning for ischemic limb, septic arthritis.  Additionally, do not suspect DVT of lower extremity.  She has no DVT risk factors.  She has no calf tenderness noted and no overlying warmth, erythema of her legs.  Additionally, aside from the knee swelling, she has no other swelling of the right lower extremity that would be concerning for asymmetric edema.    X-rays ordered at triage.  Negative for any acute bony abnormality.  Discussed results with patient.  We will plan to treat as sprain with knee brace, crutches, Ortho referral. At this time, patient exhibits no emergent life-threatening condition that require further evaluation in ED or admission. Patient had ample opportunity for questions and discussion. All patient's questions were answered  with full understanding. Strict return precautions discussed. Patient expresses understanding and agreement to plan.   Portions of this note were generated with Scientist, clinical (histocompatibility and immunogenetics). Dictation errors may occur despite best attempts at proofreading.  Final Clinical Impression(s) / ED Diagnoses Final diagnoses:  Sprain of right knee, unspecified ligament, initial encounter    Rx / DC Orders ED Discharge Orders         Ordered    methocarbamol (ROBAXIN) 500 MG tablet  2 times daily     Discontinue  Reprint     01/09/20 1849    meloxicam (MOBIC) 7.5 MG tablet  Daily     Discontinue  Reprint     01/09/20 1849           Maxwell Caul, PA-C 01/09/20 1910    Alvira Monday, MD 01/10/20 1106

## 2020-02-14 ENCOUNTER — Emergency Department (HOSPITAL_BASED_OUTPATIENT_CLINIC_OR_DEPARTMENT_OTHER)
Admission: EM | Admit: 2020-02-14 | Discharge: 2020-02-14 | Disposition: A | Discharge status: Home / Self Care | Payer: Self-pay | Attending: Emergency Medicine | Admitting: Emergency Medicine

## 2020-02-14 ENCOUNTER — Other Ambulatory Visit: Payer: Self-pay

## 2020-02-14 ENCOUNTER — Emergency Department (HOSPITAL_BASED_OUTPATIENT_CLINIC_OR_DEPARTMENT_OTHER): Payer: Self-pay

## 2020-02-14 ENCOUNTER — Encounter (HOSPITAL_BASED_OUTPATIENT_CLINIC_OR_DEPARTMENT_OTHER): Payer: Self-pay | Admitting: Emergency Medicine

## 2020-02-14 DIAGNOSIS — F17210 Nicotine dependence, cigarettes, uncomplicated: Secondary | ICD-10-CM | POA: Insufficient documentation

## 2020-02-14 DIAGNOSIS — Z79899 Other long term (current) drug therapy: Secondary | ICD-10-CM | POA: Insufficient documentation

## 2020-02-14 DIAGNOSIS — R0789 Other chest pain: Secondary | ICD-10-CM | POA: Insufficient documentation

## 2020-02-14 DIAGNOSIS — I10 Essential (primary) hypertension: Secondary | ICD-10-CM | POA: Insufficient documentation

## 2020-02-14 DIAGNOSIS — E119 Type 2 diabetes mellitus without complications: Secondary | ICD-10-CM | POA: Insufficient documentation

## 2020-02-14 DIAGNOSIS — Z794 Long term (current) use of insulin: Secondary | ICD-10-CM | POA: Insufficient documentation

## 2020-02-14 LAB — CBC WITH DIFFERENTIAL/PLATELET
Abs Immature Granulocytes: 0.04 10*3/uL (ref 0.00–0.07)
Basophils Absolute: 0.1 10*3/uL (ref 0.0–0.1)
Basophils Relative: 1 %
Eosinophils Absolute: 0.1 10*3/uL (ref 0.0–0.5)
Eosinophils Relative: 1 %
HCT: 37.4 % (ref 36.0–46.0)
Hemoglobin: 11.6 g/dL — ABNORMAL LOW (ref 12.0–15.0)
Immature Granulocytes: 0 %
Lymphocytes Relative: 22 %
Lymphs Abs: 2.7 10*3/uL (ref 0.7–4.0)
MCH: 26.3 pg (ref 26.0–34.0)
MCHC: 31 g/dL (ref 30.0–36.0)
MCV: 84.8 fL (ref 80.0–100.0)
Monocytes Absolute: 0.5 10*3/uL (ref 0.1–1.0)
Monocytes Relative: 4 %
Neutro Abs: 8.6 10*3/uL — ABNORMAL HIGH (ref 1.7–7.7)
Neutrophils Relative %: 72 %
Platelets: 291 10*3/uL (ref 150–400)
RBC: 4.41 MIL/uL (ref 3.87–5.11)
RDW: 15.9 % — ABNORMAL HIGH (ref 11.5–15.5)
WBC: 12 10*3/uL — ABNORMAL HIGH (ref 4.0–10.5)
nRBC: 0 % (ref 0.0–0.2)

## 2020-02-14 LAB — TROPONIN I (HIGH SENSITIVITY): Troponin I (High Sensitivity): 3 ng/L (ref <18)

## 2020-02-14 LAB — BASIC METABOLIC PANEL
Anion gap: 11 (ref 5–15)
BUN: 10 mg/dL (ref 6–20)
CO2: 26 mmol/L (ref 22–32)
Calcium: 9.1 mg/dL (ref 8.9–10.3)
Chloride: 102 mmol/L (ref 98–111)
Creatinine, Ser: 0.64 mg/dL (ref 0.44–1.00)
GFR calc Af Amer: >60 mL/min (ref >60)
GFR calc non Af Amer: >60 mL/min (ref >60)
Glucose, Bld: 91 mg/dL (ref 70–99)
Potassium: 3.3 mmol/L — ABNORMAL LOW (ref 3.5–5.1)
Sodium: 139 mmol/L (ref 135–145)

## 2020-02-14 MED ORDER — LIDOCAINE 5 % EX PTCH: 1.0000 | MEDICATED_PATCH | CUTANEOUS | 0 refills | Status: DC

## 2020-02-14 MED ORDER — ALUM & MAG HYDROXIDE-SIMETH 200-200-20 MG/5ML PO SUSP
30.0000 mL | Freq: Once | ORAL | Status: AC
  Administered 2020-02-14: 30 mL via ORAL
  Filled 2020-02-14: qty 30

## 2020-02-14 MED ORDER — KETOROLAC TROMETHAMINE 30 MG/ML IJ SOLN
30.0000 mg | Freq: Once | INTRAMUSCULAR | Status: AC
  Administered 2020-02-14: 30 mg via INTRAVENOUS
  Filled 2020-02-14: qty 1

## 2020-02-14 MED ORDER — LIDOCAINE 5 % EX PTCH
MEDICATED_PATCH | CUTANEOUS | Status: AC
  Administered 2020-02-14: 1
  Filled 2020-02-14: qty 1

## 2020-02-14 NOTE — ED Provider Notes (Signed)
[facility] EMERGENCY DEPARTMENT Provider Note   CSN: [id number] Arrival date & time: 02/14/20  0137     History Chief Complaint  Patient presents with  . Chest Pain    Robyn [name] is a 39 y.o. female.  The history is provided by the patient.  Chest Pain Pain location:  R chest (right shoulder ) Pain quality: not aching   Pain radiates to:  Does not radiate Pain severity:  Moderate Onset quality:  Gradual Duration:  3 days Timing:  Constant Progression:  Unchanged Chronicity:  New Context: lifting   Context comment:  Boxes  Relieved by:  Nothing Worsened by:  Movement and certain positions Ineffective treatments:  None tried Associated symptoms: no abdominal pain, no AICD problem, no altered mental status, no anorexia, no anxiety, no back pain, no claudication, no cough, no diaphoresis, no dizziness, no dysphagia, no fatigue, no fever, no headache, no heartburn, no lower extremity edema, no nausea, no near-syncope, no numbness, no orthopnea, no palpitations, no PND, no shortness of breath, no syncope, no vomiting and no weakness   Risk factors: no aortic disease   Patient with pain in the R shoulder area after lifting at work.  No DOE, no SOB.  No n/v/d.  No f/c/r.  No OCP, no travel, no leg pain.       Past Medical History:  Diagnosis Date  . Anxiety    h/o panic attacks  . Diabetes mellitus without complication (HCC)    diet controlled   . Diverticulosis 02/16/2019  . Herpes   . Hypertension   . Left ovarian cyst 11/29/2011  . PCOS (polycystic ovarian syndrome)   . Pregnancy induced hypertension   . Shortness of breath dyspnea    with exertion and while pregnant  . Smoker 11/29/2011    Patient Active Problem List   Diagnosis Date Noted  . Tubo-ovarian abscess 08/06/2019  . TOA (tubo-ovarian abscess) 08/05/2019  . Colonic diverticular abscess 02/10/2019  . Diverticulitis 02/09/2019  . S/P repeat low transverse C-section 03/23/2015  .  Hypertension - on Labetalol 200 mg po bid 11/22/2014  . Diabetes mellitus type 2, uncontrolled, without complications -- dx'd in 2011 11/22/2014  . Hx of incompetent cervix, currently pregnant - cerclage placed on 10/09/14 by Dr. Su Hiltoberts 11/22/2014  . History of preterm delivery x 2 - weekly 17P injections - first injection at 16.5 wks 11/22/2014  . Elderly multigravida 11/22/2014  . PCOS (polycystic ovarian syndrome) 08/21/2014  . Herpes 08/21/2014  . ANA positive 08/21/2014  . Previous cesarean delivery, antepartum condition or complication x 2--previous vertical incision 08/21/2014  . Obesity 05/20/2012    Past Surgical History:  Procedure Laterality Date  . CERVICAL CERCLAGE N/A 10/09/2014   Procedure: CERCLAGE CERVICAL;  Surgeon: Osborn CohoAngela Roberts, MD;  Location: [facility];  Service: Gynecology;  Laterality: N/A;  . CESAREAN SECTION     C/S x 2  . CESAREAN SECTION WITH BILATERAL TUBAL LIGATION Bilateral 03/23/2015   Procedure: CESAREAN SECTION WITH BILATERAL TUBAL LIGATION;  Surgeon: Osborn CohoAngela Roberts, MD;  Location: [facility];  Service: Obstetrics;  Laterality: Bilateral;  Amt OK'd to move 02/25/2015  . FLEXIBLE SIGMOIDOSCOPY N/A 05/08/2019   Procedure: FLEXIBLE SIGMOIDOSCOPY;  Surgeon: Romie Leveehomas, Alicia, MD;  Location: [facility];  Service: Endoscopy;  Laterality: N/A;  . IR RADIOLOGIST EVAL & MGMT  02/20/2019  . IR RADIOLOGIST EVAL & MGMT  03/06/2019  . IR RADIOLOGIST EVAL & MGMT  08/21/2019  . WISDOM TOOTH EXTRACTION  OB History    Gravida  5   Para  3   Term      Preterm  3   AB  2   Living  3     SAB  1   TAB  1   Ectopic      Multiple  0   Live Births  3           Family History  Problem Relation Age of Onset  . Hypertension Mother   . Diabetes Father   . Stroke Father   . Cancer Maternal Grandmother   . Cancer Maternal Uncle     Social History   Tobacco Use  . Smoking status: Current Every Day Smoker    Packs/day: 0.25    Years: 13.00    Pack  years: 3.25    Types: Cigarettes  . Smokeless tobacco: Never Used  Vaping Use  . Vaping Use: Never used  Substance Use Topics  . Alcohol use: Yes    Comment: socially and rarely  . Drug use: No    Home Medications Prior to Admission medications   Medication Sig Start Date End Date Taking? Authorizing Provider  hydrochlorothiazide (HYDRODIURIL) 25 MG tablet Take 25 mg by mouth daily. 01/24/19   [provider]  losartan (COZAAR) 50 MG tablet Take 50 mg by mouth daily. 01/24/19   [provider]  metFORMIN (GLUCOPHAGE-XR) 500 MG 24 hr tablet Take 1,000 mg by mouth 2 (two) times daily. 12/23/18   [provider]  methocarbamol (ROBAXIN) 500 MG tablet Take 1 tablet (500 mg total) by mouth 2 (two) times daily. 01/09/20   Maxwell Caul, PA-C  NOVOLIN 70/30 RELION (70-30) 100 UNIT/ML injection Inject 15 Units into the skin 2 (two) times daily between meals as needed (if blood sugar is high).  07/17/19   [provider]    Allergies    Patient has no known allergies.  Review of Systems   Review of Systems  Constitutional: Negative for diaphoresis, fatigue and fever.  HENT: Negative for trouble swallowing.   Eyes: Negative for visual disturbance.  Respiratory: Negative for cough and shortness of breath.   Cardiovascular: Positive for chest pain. Negative for palpitations, orthopnea, claudication, syncope, PND and near-syncope.  Gastrointestinal: Negative for abdominal pain, anorexia, heartburn, nausea and vomiting.  Genitourinary: Negative for difficulty urinating.  Musculoskeletal: Negative for back pain.  Skin: Negative for rash.  Neurological: Negative for dizziness, weakness, numbness and headaches.  Psychiatric/Behavioral: Negative for agitation.  All other systems reviewed and are negative.   Physical Exam Updated Vital Signs BP 135/83   Pulse 79   Temp 99.7 F (37.6 C) (Oral)   Resp 19   Ht 5\' 8"  (1.727 m)   Wt 123.4 kg   LMP  01/11/2020   SpO2 98%   BMI 41.36 kg/m   Physical Exam Vitals and nursing note reviewed.  Constitutional:      General: She is not in acute distress.    Appearance: Normal appearance.  HENT:     Head: Normocephalic and atraumatic.     Nose: Nose normal.  Eyes:     Conjunctiva/sclera: Conjunctivae normal.     Pupils: Pupils are equal, round, and reactive to light.  Cardiovascular:     Rate and Rhythm: Normal rate and regular rhythm.     Pulses: Normal pulses.     Heart sounds: Normal heart sounds.  Pulmonary:     Effort: Pulmonary effort is normal.  Breath sounds: Normal breath sounds.  Abdominal:     General: Abdomen is flat. Bowel sounds are normal.     Palpations: Abdomen is soft.     Tenderness: There is no abdominal tenderness. There is no guarding or rebound.  Musculoskeletal:        General: Normal range of motion.     Cervical back: Normal range of motion and neck supple.  Skin:    General: Skin is warm and dry.     Capillary Refill: Capillary refill takes less than 2 seconds.  Neurological:     General: No focal deficit present.     Mental Status: She is alert and oriented to person, place, and time.     Deep Tendon Reflexes: Reflexes normal.  Psychiatric:        Mood and Affect: Mood normal.        Behavior: Behavior normal.     ED Results / Procedures / Treatments   Labs (all labs ordered are listed, but only abnormal results are displayed) Results for orders placed or performed during the hospital encounter of 02/14/20  CBC with Differential/Platelet  Result Value Ref Range   WBC 12.0 (H) 4.0 - 10.5 K/uL   RBC 4.41 3.87 - 5.11 MIL/uL   Hemoglobin 11.6 (L) 12.0 - 15.0 g/dL   HCT 54.0 36 - 46 %   MCV 84.8 80.0 - 100.0 fL   MCH 26.3 26.0 - 34.0 pg   MCHC 31.0 30.0 - 36.0 g/dL   RDW 98.1 (H) 19.1 - 47.8 %   Platelets 291 150 - 400 K/uL   nRBC 0.0 0.0 - 0.2 %   Neutrophils Relative % 72 %   Neutro Abs 8.6 (H) 1.7 - 7.7 K/uL   Lymphocytes Relative  22 %   Lymphs Abs 2.7 0.7 - 4.0 K/uL   Monocytes Relative 4 %   Monocytes Absolute 0.5 0 - 1 K/uL   Eosinophils Relative 1 %   Eosinophils Absolute 0.1 0 - 0 K/uL   Basophils Relative 1 %   Basophils Absolute 0.1 0 - 0 K/uL   Immature Granulocytes 0 %   Abs Immature Granulocytes 0.04 0.00 - 0.07 K/uL  Basic metabolic panel  Result Value Ref Range   Sodium 139 135 - 145 mmol/L   Potassium 3.3 (L) 3.5 - 5.1 mmol/L   Chloride 102 98 - 111 mmol/L   CO2 26 22 - 32 mmol/L   Glucose, Bld 91 70 - 99 mg/dL   BUN 10 6 - 20 mg/dL   Creatinine, Ser 2.95 0.44 - 1.00 mg/dL   Calcium 9.1 8.9 - 62.1 mg/dL   GFR calc non Af Amer >60 >60 mL/min   GFR calc Af Amer >60 >60 mL/min   Anion gap 11 5 - 15  Troponin I (High Sensitivity)  Result Value Ref Range   Troponin I (High Sensitivity) 3 <18 ng/L   DG Chest Portable 1 View  Result Date: 02/14/2020 CLINICAL DATA:  Chest pain, left arm pain EXAM: PORTABLE CHEST 1 VIEW COMPARISON:  12/03/2017 FINDINGS: The heart size and mediastinal contours are within normal limits. Both lungs are clear. The visualized skeletal structures are unremarkable. IMPRESSION: No active disease. Electronically Signed   By: Helyn Numbers MD   On: 02/14/2020 03:03    EKG EKG Interpretation  Date/Time:  Saturday February 14 2020 01:48:53 EDT Ventricular Rate:  82 PR Interval:  182 QRS Duration: 88 QT Interval:  370 QTC Calculation: 432 R Axis:  72 Text Interpretation: Normal sinus rhythm Confirmed by Nicanor Alcon, Aimi Essner ([id number]) on 02/14/2020 1:56:01 AM   Radiology DG Chest Portable 1 View  Result Date: 02/14/2020 CLINICAL DATA:  Chest pain, left arm pain EXAM: PORTABLE CHEST 1 VIEW COMPARISON:  12/03/2017 FINDINGS: The heart size and mediastinal contours are within normal limits. Both lungs are clear. The visualized skeletal structures are unremarkable. IMPRESSION: No active disease. Electronically Signed   By: Helyn Numbers MD   On: 02/14/2020 03:03     Procedures Procedures (including critical care time)  Medications Ordered in ED Medications  ketorolac (TORADOL) 30 MG/ML injection 30 mg (30 mg Intravenous Given 02/14/20 0332)  alum & mag hydroxide-simeth (MAALOX/MYLANTA) 200-200-20 MG/5ML suspension 30 mL (30 mLs Oral Given 02/14/20 0332)  lidocaine (LIDODERM) 5 % (1 patch  Patch Applied 02/14/20 0343)    ED Course  I have reviewed the triage vital signs and the nursing notes.  Pertinent labs & imaging results that were available during my care of the patient were reviewed by me and considered in my medical decision making (see chart for details).    Based on the time course, ruled out for MI with negative ekg and troponin.  PERC negative and wells 0.  Highly doubt PE in this low risk patient.  Symptoms are classic for MSK pain.  Alternate tylenol and ibuprofen.  Will add lidoderm.    Desire Fulp was evaluated in Emergency Department on 02/14/2020 for the symptoms described in the history of present illness. She was evaluated in the context of the global COVID-19 pandemic, which necessitated consideration that the patient might be at risk for infection with the SARS-CoV-2 virus that causes COVID-19. Institutional protocols and algorithms that pertain to the evaluation of patients at risk for COVID-19 are in a state of rapid change based on information released by regulatory bodies including the CDC and federal and state organizations. These policies and algorithms were followed during the patient's care in the ED.  Final Clinical Impression(s) / ED Diagnoses Return for intractable cough, coughing up blood,fevers >100.4 unrelieved by medication, shortness of breath, intractable vomiting, chest pain, shortness of breath, weakness,numbness, changes in speech, facial asymmetry,abdominal pain, passing out,Inability to tolerate liquids or food, cough, altered mental status or any concerns. No signs of systemic illness or infection.  The patient is nontoxic-appearing on exam and vital signs are within normal limits.   I have reviewed the triage vital signs and the nursing notes. Pertinent labs &imaging results that were available during my care of the patient were reviewed by me and considered in my medical decision making (see chart for details).After history, exam, and medical workup I feel the patient has beenappropriately medically screened and is safe for discharge home. Pertinent diagnoses were discussed with the patient. Patient was given return precautions.      Emet Rafanan, MD 02/14/20 [phone]

## 2020-02-14 NOTE — ED Triage Notes (Signed)
Pt states she is having really bad chest pain that started a on Wednesday  Pt states the pain is in the right side of her chest and today is going up into her neck and right arm

## 2020-02-26 ENCOUNTER — Other Ambulatory Visit (HOSPITAL_COMMUNITY): Payer: Medicaid Other

## 2020-03-01 ENCOUNTER — Ambulatory Visit (HOSPITAL_COMMUNITY): Admission: RE | Admit: 2020-03-01 | Payer: 59 | Source: Home / Self Care | Admitting: Obstetrics and Gynecology

## 2020-03-01 ENCOUNTER — Encounter (HOSPITAL_COMMUNITY): Admission: RE | Payer: Self-pay | Source: Home / Self Care

## 2020-03-01 SURGERY — SALPINGO-OOPHORECTOMY, BILATERAL, LAPAROSCOPIC
Anesthesia: General | Laterality: Left

## 2020-08-31 ENCOUNTER — Other Ambulatory Visit: Payer: Self-pay

## 2020-08-31 ENCOUNTER — Emergency Department (HOSPITAL_BASED_OUTPATIENT_CLINIC_OR_DEPARTMENT_OTHER)
Admission: EM | Admit: 2020-08-31 | Discharge: 2020-08-31 | Disposition: A | Discharge status: Home / Self Care | Payer: Medicaid Other | Attending: Emergency Medicine | Admitting: Emergency Medicine

## 2020-08-31 ENCOUNTER — Encounter (HOSPITAL_BASED_OUTPATIENT_CLINIC_OR_DEPARTMENT_OTHER): Payer: Self-pay | Admitting: *Deleted

## 2020-08-31 ENCOUNTER — Other Ambulatory Visit (HOSPITAL_BASED_OUTPATIENT_CLINIC_OR_DEPARTMENT_OTHER): Payer: Self-pay

## 2020-08-31 DIAGNOSIS — Z794 Long term (current) use of insulin: Secondary | ICD-10-CM | POA: Insufficient documentation

## 2020-08-31 DIAGNOSIS — Z79899 Other long term (current) drug therapy: Secondary | ICD-10-CM | POA: Insufficient documentation

## 2020-08-31 DIAGNOSIS — E119 Type 2 diabetes mellitus without complications: Secondary | ICD-10-CM | POA: Insufficient documentation

## 2020-08-31 DIAGNOSIS — Z7984 Long term (current) use of oral hypoglycemic drugs: Secondary | ICD-10-CM | POA: Insufficient documentation

## 2020-08-31 DIAGNOSIS — K0889 Other specified disorders of teeth and supporting structures: Secondary | ICD-10-CM | POA: Insufficient documentation

## 2020-08-31 DIAGNOSIS — K029 Dental caries, unspecified: Secondary | ICD-10-CM | POA: Insufficient documentation

## 2020-08-31 DIAGNOSIS — F17210 Nicotine dependence, cigarettes, uncomplicated: Secondary | ICD-10-CM | POA: Insufficient documentation

## 2020-08-31 DIAGNOSIS — I10 Essential (primary) hypertension: Secondary | ICD-10-CM | POA: Insufficient documentation

## 2020-08-31 MED ORDER — HYDROCODONE-ACETAMINOPHEN 5-325 MG PO TABS
1.0000 | ORAL_TABLET | Freq: Four times a day (QID) | ORAL | 0 refills | Status: DC | PRN
Start: 2020-08-31 — End: 2021-04-01
  Filled 2020-08-31: qty 10, 3d supply, fill #0

## 2020-08-31 MED ORDER — NAPROXEN 500 MG PO TABS
500.0000 mg | ORAL_TABLET | Freq: Two times a day (BID) | ORAL | 0 refills | Status: DC
  Filled 2020-08-31: qty 30, 15d supply, fill #0

## 2020-08-31 MED ORDER — AMOXICILLIN 500 MG PO CAPS
500.0000 mg | ORAL_CAPSULE | Freq: Two times a day (BID) | ORAL | 0 refills | Status: AC
  Filled 2020-08-31: qty 10, 5d supply, fill #0

## 2020-08-31 NOTE — ED Triage Notes (Signed)
Dental pain with facial swelling since yesterday. Pt feels she has an abscess.

## 2020-08-31 NOTE — ED Notes (Signed)
Pt discharged to home NAD.  

## 2020-08-31 NOTE — Discharge Instructions (Signed)
I have written you for a short course of pain medicine.  Do not drive or operate heavy machinery while taking this medication.  I have also written you for some antiinflammatories to help with any swelling.  I have written you for amoxicillin.  Follow-up with dentistry.  Return for new worsening symptoms.

## 2020-08-31 NOTE — ED Provider Notes (Signed)
[facility] EMERGENCY DEPARTMENT Provider Note   CSN: [id number] Arrival date & time: 08/31/20  1051    History Chief Complaint  Patient presents with  . Dental Pain    Robyn [name] is a 40 y.o. female with past history significant for diet-controlled diabetes, hypertension who presents for evaluation of dental pain.  Began yesterday.  Located to right lower tooth.  She has known fractured tooth.  Took 1 amoxicillin.  Feels like she is being have some facial swelling.  Has tried ice without relief.  Not currently followed by dentistry.  No fever, chills, nausea, vomiting, intra oral edema, neck pain, neck stiffness, voice changes, drooling, dysphagia, trismus.  Denies additional aggravating or alleviating factors.  Rates pain an 8/10.  History obtained from patient and past medical records.  No interpreter used.  HPI     Past Medical History:  Diagnosis Date  . Anxiety    h/o panic attacks  . Diabetes mellitus without complication (HCC)    diet controlled   . Diverticulosis 02/16/2019  . Herpes   . Hypertension   . Left ovarian cyst 11/29/2011  . PCOS (polycystic ovarian syndrome)   . Pregnancy induced hypertension   . Shortness of breath dyspnea    with exertion and while pregnant  . Smoker 11/29/2011    Patient Active Problem List   Diagnosis Date Noted  . Tubo-ovarian abscess 08/06/2019  . TOA (tubo-ovarian abscess) 08/05/2019  . Colonic diverticular abscess 02/10/2019  . Diverticulitis 02/09/2019  . S/P repeat low transverse C-section 03/23/2015  . Hypertension - on Labetalol 200 mg po bid 11/22/2014  . Diabetes mellitus type 2, uncontrolled, without complications -- dx'd in 2011 11/22/2014  . Hx of incompetent cervix, currently pregnant - cerclage placed on 10/09/14 by Dr. Su Hilt 11/22/2014  . History of preterm delivery x 2 - weekly 17P injections - first injection at 16.5 wks 11/22/2014  . Elderly multigravida 11/22/2014  . PCOS (polycystic ovarian  syndrome) 08/21/2014  . Herpes 08/21/2014  . ANA positive 08/21/2014  . Previous cesarean delivery, antepartum condition or complication x 2--previous vertical incision 08/21/2014  . Obesity 05/20/2012    Past Surgical History:  Procedure Laterality Date  . CERVICAL CERCLAGE N/A 10/09/2014   Procedure: CERCLAGE CERVICAL;  Surgeon: Osborn Coho, MD;  Location: [facility];  Service: Gynecology;  Laterality: N/A;  . CESAREAN SECTION     C/S x 2  . CESAREAN SECTION WITH BILATERAL TUBAL LIGATION Bilateral 03/23/2015   Procedure: CESAREAN SECTION WITH BILATERAL TUBAL LIGATION;  Surgeon: Osborn Coho, MD;  Location: [facility];  Service: Obstetrics;  Laterality: Bilateral;  Amt OK'd to move 02/25/2015  . FLEXIBLE SIGMOIDOSCOPY N/A 05/08/2019   Procedure: FLEXIBLE SIGMOIDOSCOPY;  Surgeon: Romie Levee, MD;  Location: [facility];  Service: Endoscopy;  Laterality: N/A;  . IR RADIOLOGIST EVAL & MGMT  02/20/2019  . IR RADIOLOGIST EVAL & MGMT  03/06/2019  . IR RADIOLOGIST EVAL & MGMT  08/21/2019  . WISDOM TOOTH EXTRACTION       OB History    Gravida  5   Para  3   Term      Preterm  3   AB  2   Living  3     SAB  1   IAB  1   Ectopic      Multiple  0   Live Births  3           Family History  Problem Relation Age of Onset  .  Hypertension Mother   . Diabetes Father   . Stroke Father   . Cancer Maternal Grandmother   . Cancer Maternal Uncle     Social History   Tobacco Use  . Smoking status: Current Every Day Smoker    Packs/day: 0.25    Years: 13.00    Pack years: 3.25    Types: Cigarettes  . Smokeless tobacco: Never Used  Vaping Use  . Vaping Use: Never used  Substance Use Topics  . Alcohol use: Yes    Comment: socially and rarely  . Drug use: No    Home Medications Prior to Admission medications   Medication Sig Start Date End Date Taking? Authorizing Provider  amoxicillin (AMOXIL) 500 MG capsule Take 1 capsule (500 mg total) by mouth 2 (two) times  daily for 5 days. 08/31/20 09/05/20 Yes Carlia Bomkamp A, PA-C  HYDROcodone-acetaminophen (NORCO/VICODIN) 5-325 MG tablet Take 1 tablet by mouth every 6 (six) hours as needed. 08/31/20  Yes Kevionna Heffler A, PA-C  naproxen (NAPROSYN) 500 MG tablet Take 1 tablet (500 mg total) by mouth 2 (two) times daily. 08/31/20  Yes Abbygayle Helfand A, PA-C  hydrochlorothiazide (HYDRODIURIL) 25 MG tablet Take 25 mg by mouth daily. 01/24/19   [provider]  lidocaine (LIDODERM) 5 % Place 1 patch onto the skin daily. Remove & Discard patch within 12 hours or as directed by MD 02/14/20   Nicanor AlconPalumbo, April, MD  losartan (COZAAR) 50 MG tablet Take 50 mg by mouth daily. 01/24/19   [provider]  metFORMIN (GLUCOPHAGE-XR) 500 MG 24 hr tablet Take 1,000 mg by mouth 2 (two) times daily. 12/23/18   [provider]  methocarbamol (ROBAXIN) 500 MG tablet Take 1 tablet (500 mg total) by mouth 2 (two) times daily. 01/09/20   Maxwell CaulLayden, Lindsey A, PA-C  NOVOLIN 70/30 RELION (70-30) 100 UNIT/ML injection Inject 15 Units into the skin 2 (two) times daily between meals as needed (if blood sugar is high).  07/17/19   [provider]    Allergies    Patient has no known allergies.  Review of Systems   Review of Systems  Constitutional: Negative.   HENT: Positive for dental problem.   Respiratory: Negative.   Cardiovascular: Negative.   Gastrointestinal: Negative.   Genitourinary: Negative.   Musculoskeletal: Negative.   Skin: Negative.   Neurological: Negative.   All other systems reviewed and are negative.   Physical Exam Updated Vital Signs BP (!) 169/96 (BP Location: Right Arm)   Pulse 83   Temp 98.1 F (36.7 C) (Oral)   Resp 18   Ht 5\' 8"  (1.727 m)   Wt 123.4 kg   LMP 08/26/2020   SpO2 100%   BMI 41.36 kg/m   Physical Exam Vitals and nursing note reviewed.  Constitutional:      General: She is not in acute distress.    Appearance: She is well-developed. She is not  ill-appearing, toxic-appearing or diaphoretic.  HENT:     Head: Normocephalic and atraumatic.     Jaw: There is normal jaw occlusion.     Comments: No some mandibular edema, erythema.  No Ludwig's angina no obvious facial swelling    Nose: Nose normal.     Mouth/Throat:     Lips: Pink.     Mouth: Mucous membranes are moist. No injury, lacerations, oral lesions or angioedema.     Dentition: Abnormal dentition. Dental tenderness and dental caries present. No dental abscesses or gum lesions.  Tongue: No lesions. Tongue does not deviate from midline.     Pharynx: Oropharynx is clear. Uvula midline.     Tonsils: No tonsillar exudate or tonsillar abscesses.      Comments: No drooling, dysphagia or trismus.  Sublingual area soft.  Uvula midline.  Fractured tooth to left lower dentition.  Mild surrounding gingival erythema.  No evidence of drainable periapical abscess. Eyes:     Pupils: Pupils are equal, round, and reactive to light.  Neck:     Trachea: Trachea and phonation normal.     Comments: No neck stiffness or neck rigidity.  Phonation normal Cardiovascular:     Rate and Rhythm: Normal rate.  Pulmonary:     Effort: No respiratory distress.  Abdominal:     General: There is no distension.  Musculoskeletal:        General: Normal range of motion.     Cervical back: Full passive range of motion without pain and normal range of motion.  Skin:    General: Skin is warm and dry.     Capillary Refill: Capillary refill takes less than 2 seconds.     Comments: No edema, erythema or warmth  Neurological:     Mental Status: She is alert.     Comments: Cranial nerves II through XII grossly intact     ED Results / Procedures / Treatments   Labs (all labs ordered are listed, but only abnormal results are displayed) Labs Reviewed - No data to display  EKG None  Radiology No results found.  Procedures Procedures   Medications Ordered in ED Medications - No data to display  ED  Course  I have reviewed the triage vital signs and the nursing notes.  Pertinent labs & imaging results that were available during my care of the patient were reviewed by me and considered in my medical decision making (see chart for details).  Patient here for evaluation of left lower dental pain which began yesterday.  She is afebrile, nonseptic, non-ill-appearing.  Her heart and lungs are clear.  She has no stridor.  She speaks in full sentences.  She is tolerating p.o. intake at home.  No evidence of pooling secretions.  No drooling, dysphagia or trismus.  No evidence of Ludwig's angina.  No obvious facial swelling.  No obvious periapical abscess or evidence of drainable facial abscess.  No neck stiffness neck rigidity.  No meningismus.  Patient with likely early dental infection.  DC home with NSAIDs, antibiotics.  Dental resources given.  Will return for new worsening symptoms.  The patient has been appropriately medically screened and/or stabilized in the ED. I have low suspicion for any other emergent medical condition which would require further screening, evaluation or treatment in the ED or require inpatient management.  Patient is hemodynamically stable and in no acute distress.  Patient able to ambulate in department prior to ED.  Evaluation does not show acute pathology that would require ongoing or additional emergent interventions while in the emergency department or further inpatient treatment.  I have discussed the diagnosis with the patient and answered all questions.  Pain is been managed while in the emergency department and patient has no further complaints prior to discharge.  Patient is comfortable with plan discussed in room and is stable for discharge at this time.  I have discussed strict return precautions for returning to the emergency department.  Patient was encouraged to follow-up with PCP/specialist refer to at discharge.    MDM Rules/Calculators/A&P  Final Clinical Impression(s) / ED Diagnoses Final diagnoses:  Pain, dental    Rx / DC Orders ED Discharge Orders         Ordered    naproxen (NAPROSYN) 500 MG tablet  2 times daily        08/31/20 1200    amoxicillin (AMOXIL) 500 MG capsule  2 times daily        08/31/20 1200    HYDROcodone-acetaminophen (NORCO/VICODIN) 5-325 MG tablet  Every 6 hours PRN        08/31/20 1200           Earlena Werst A, PA-C 08/31/20 1200    Vanetta Mulders, MD 09/01/20 [phone]

## 2020-11-08 ENCOUNTER — Other Ambulatory Visit: Payer: Self-pay

## 2020-11-08 ENCOUNTER — Emergency Department (HOSPITAL_BASED_OUTPATIENT_CLINIC_OR_DEPARTMENT_OTHER)
Admission: EM | Admit: 2020-11-08 | Discharge: 2020-11-09 | Disposition: A | Discharge status: Home / Self Care | Payer: Medicaid Other | Attending: Emergency Medicine | Admitting: Emergency Medicine

## 2020-11-08 ENCOUNTER — Encounter (HOSPITAL_BASED_OUTPATIENT_CLINIC_OR_DEPARTMENT_OTHER): Payer: Self-pay | Admitting: Emergency Medicine

## 2020-11-08 DIAGNOSIS — I10 Essential (primary) hypertension: Secondary | ICD-10-CM | POA: Insufficient documentation

## 2020-11-08 DIAGNOSIS — S1096XA Insect bite of unspecified part of neck, initial encounter: Secondary | ICD-10-CM | POA: Insufficient documentation

## 2020-11-08 DIAGNOSIS — F17210 Nicotine dependence, cigarettes, uncomplicated: Secondary | ICD-10-CM | POA: Insufficient documentation

## 2020-11-08 DIAGNOSIS — Z79899 Other long term (current) drug therapy: Secondary | ICD-10-CM | POA: Insufficient documentation

## 2020-11-08 DIAGNOSIS — E1169 Type 2 diabetes mellitus with other specified complication: Secondary | ICD-10-CM | POA: Insufficient documentation

## 2020-11-08 DIAGNOSIS — E669 Obesity, unspecified: Secondary | ICD-10-CM | POA: Insufficient documentation

## 2020-11-08 DIAGNOSIS — W57XXXA Bitten or stung by nonvenomous insect and other nonvenomous arthropods, initial encounter: Secondary | ICD-10-CM | POA: Insufficient documentation

## 2020-11-08 DIAGNOSIS — Z794 Long term (current) use of insulin: Secondary | ICD-10-CM | POA: Insufficient documentation

## 2020-11-08 DIAGNOSIS — Z7984 Long term (current) use of oral hypoglycemic drugs: Secondary | ICD-10-CM | POA: Insufficient documentation

## 2020-11-08 NOTE — ED Triage Notes (Signed)
Pt c/o possible insect bite to left neck. Pt states for her job she is outside a lot and dealing with the bugs and spiders. Pt states that the area started itching on Friday but noticed the swelling today. Pt states that she having some leg and lower back pain. Pt aaox3, ambulatory from waiting room, VSS, GCS 15, NAD noted.

## 2020-11-09 ENCOUNTER — Emergency Department (HOSPITAL_BASED_OUTPATIENT_CLINIC_OR_DEPARTMENT_OTHER)
Admission: EM | Admit: 2020-11-09 | Discharge: 2020-11-10 | Disposition: A | Discharge status: Home / Self Care | Payer: Medicaid Other | Attending: Emergency Medicine | Admitting: Emergency Medicine

## 2020-11-09 ENCOUNTER — Other Ambulatory Visit: Payer: Self-pay

## 2020-11-09 ENCOUNTER — Encounter (HOSPITAL_BASED_OUTPATIENT_CLINIC_OR_DEPARTMENT_OTHER): Payer: Self-pay

## 2020-11-09 DIAGNOSIS — F17210 Nicotine dependence, cigarettes, uncomplicated: Secondary | ICD-10-CM | POA: Insufficient documentation

## 2020-11-09 DIAGNOSIS — E119 Type 2 diabetes mellitus without complications: Secondary | ICD-10-CM | POA: Insufficient documentation

## 2020-11-09 DIAGNOSIS — I10 Essential (primary) hypertension: Secondary | ICD-10-CM | POA: Insufficient documentation

## 2020-11-09 DIAGNOSIS — Z79899 Other long term (current) drug therapy: Secondary | ICD-10-CM | POA: Insufficient documentation

## 2020-11-09 DIAGNOSIS — Z7984 Long term (current) use of oral hypoglycemic drugs: Secondary | ICD-10-CM | POA: Insufficient documentation

## 2020-11-09 DIAGNOSIS — Z794 Long term (current) use of insulin: Secondary | ICD-10-CM | POA: Insufficient documentation

## 2020-11-09 DIAGNOSIS — R519 Headache, unspecified: Secondary | ICD-10-CM

## 2020-11-09 MED ORDER — CEPHALEXIN 500 MG PO CAPS: 500.0000 mg | ORAL_CAPSULE | Freq: Three times a day (TID) | ORAL | 0 refills | Status: AC

## 2020-11-09 MED ORDER — KETOROLAC TROMETHAMINE 60 MG/2ML IM SOLN
30.0000 mg | Freq: Once | INTRAMUSCULAR | Status: AC
  Administered 2020-11-09: 30 mg via INTRAMUSCULAR
  Filled 2020-11-09: qty 2

## 2020-11-09 NOTE — ED Provider Notes (Addendum)
[facility] Provider Note   CSN: [id number] Arrival date & time: 11/09/20  2233     History Chief Complaint  Patient presents with   Headache   Hypertension    Robyn [name] is a 40 y.o. female.  Patient is a 40 year old female with a history of diabetes and prior hypertension who presents with elevated blood pressure and headache.  She states she has had a bifrontal headache for the last 2 days.  There is no radiation down her neck.  It started gradually and has gotten worse over the last 2 days.  No nausea or vomiting.  No photophobia.  No numbness or weakness to her extremities.  No chest pain.  She states she was previously on losartan and hydrochlorothiazide but her primary care doctor took her off of her medicines about a year ago because she lost weight.  He said normally her blood pressure runs in the 130s to 140s but over the last couple days its been higher.  She was seen in the ED yesterday for a possible insect bite to her left neck area.  She was started on Keflex.  She denies any worsening of the symptoms.  No fevers.  She has been having some intermittent pains over the last couple days and her right upper arm and her right knee.  No numbness or weakness to these areas.       Past Medical History:  Diagnosis Date   Anxiety    h/o panic attacks   Diabetes mellitus without complication (HCC)    diet controlled    Diverticulosis 02/16/2019   Herpes    Hypertension    Left ovarian cyst 11/29/2011   PCOS (polycystic ovarian syndrome)    Pregnancy induced hypertension    Shortness of breath dyspnea    with exertion and while pregnant   Smoker 11/29/2011    Patient Active Problem List   Diagnosis Date Noted   Tubo-ovarian abscess 08/06/2019   TOA (tubo-ovarian abscess) 08/05/2019   Colonic diverticular abscess 02/10/2019   Diverticulitis 02/09/2019   S/P repeat low transverse C-section 03/23/2015   Hypertension - on Labetalol 200 mg po  bid 11/22/2014   Diabetes mellitus type 2, uncontrolled, without complications -- dx'd in 2011 11/22/2014   Hx of incompetent cervix, currently pregnant - cerclage placed on 10/09/14 by Dr. Su Hiltoberts 11/22/2014   History of preterm delivery x 2 - weekly 17P injections - first injection at 16.5 wks 11/22/2014   Elderly multigravida 11/22/2014   PCOS (polycystic ovarian syndrome) 08/21/2014   Herpes 08/21/2014   ANA positive 08/21/2014   Previous cesarean delivery, antepartum condition or complication x 2--previous vertical incision 08/21/2014   Obesity 05/20/2012    Past Surgical History:  Procedure Laterality Date   CERVICAL CERCLAGE N/A 10/09/2014   Procedure: CERCLAGE CERVICAL;  Surgeon: Osborn CohoAngela Roberts, MD;  Location: [facility];  Service: Gynecology;  Laterality: N/A;   CESAREAN SECTION     C/S x 2   CESAREAN SECTION WITH BILATERAL TUBAL LIGATION Bilateral 03/23/2015   Procedure: CESAREAN SECTION WITH BILATERAL TUBAL LIGATION;  Surgeon: Osborn CohoAngela Roberts, MD;  Location: [facility];  Service: Obstetrics;  Laterality: Bilateral;  Amt OK'd to move 02/25/2015   FLEXIBLE SIGMOIDOSCOPY N/A 05/08/2019   Procedure: FLEXIBLE SIGMOIDOSCOPY;  Surgeon: Romie Leveehomas, Alicia, MD;  Location: [facility];  Service: Endoscopy;  Laterality: N/A;   IR RADIOLOGIST EVAL & MGMT  02/20/2019   IR RADIOLOGIST EVAL & MGMT  03/06/2019   IR RADIOLOGIST EVAL & MGMT  08/21/2019   WISDOM TOOTH EXTRACTION       OB History     Gravida  5   Para  3   Term      Preterm  3   AB  2   Living  3      SAB  1   IAB  1   Ectopic      Multiple  0   Live Births  3           Family History  Problem Relation Age of Onset   Hypertension Mother    Diabetes Father    Stroke Father    Cancer Maternal Grandmother    Cancer Maternal Uncle     Social History   Tobacco Use   Smoking status: Every Day    Packs/day: 0.25    Years: 13.00    Pack years: 3.25    Types: Cigarettes   Smokeless tobacco: Never  Vaping Use    Vaping Use: Never used  Substance Use Topics   Alcohol use: Yes    Comment: socially and rarely   Drug use: No    Home Medications Prior to Admission medications   Medication Sig Start Date End Date Taking? Authorizing Provider  cephALEXin (KEFLEX) 500 MG capsule Take 1 capsule (500 mg total) by mouth 3 (three) times daily for 7 days. 11/09/20 11/16/20  Milagros Loll, MD  hydrochlorothiazide (HYDRODIURIL) 25 MG tablet Take 25 mg by mouth daily. 01/24/19   [provider]  HYDROcodone-acetaminophen (NORCO/VICODIN) 5-325 MG tablet Take 1 tablet by mouth every 6 (six) hours as needed. 08/31/20   Henderly, Britni A, PA-C  lidocaine (LIDODERM) 5 % Place 1 patch onto the skin daily. Remove & Discard patch within 12 hours or as directed by MD 02/14/20   Nicanor Alcon, April, MD  losartan (COZAAR) 50 MG tablet Take 1 tablet (50 mg total) by mouth daily. 11/10/20   Rolan Bucco, MD  metFORMIN (GLUCOPHAGE-XR) 500 MG 24 hr tablet Take 1,000 mg by mouth 2 (two) times daily. 12/23/18   [provider]  methocarbamol (ROBAXIN) 500 MG tablet Take 1 tablet (500 mg total) by mouth 2 (two) times daily. 01/09/20   Maxwell Caul, PA-C  naproxen (NAPROSYN) 500 MG tablet Take 1 tablet (500 mg total) by mouth 2 (two) times daily. 08/31/20   Henderly, Britni A, PA-C  NOVOLIN 70/30 RELION (70-30) 100 UNIT/ML injection Inject 15 Units into the skin 2 (two) times daily between meals as needed (if blood sugar is high).  07/17/19   [provider]    Allergies    Patient has no known allergies.  Review of Systems   Review of Systems  Constitutional:  Negative for chills, diaphoresis, fatigue and fever.  HENT:  Negative for congestion, rhinorrhea and sneezing.   Eyes: Negative.   Respiratory:  Negative for cough, chest tightness and shortness of breath.   Cardiovascular:  Negative for chest pain and leg swelling.  Gastrointestinal:  Negative for abdominal pain, blood in stool, diarrhea,  nausea and vomiting.  Genitourinary:  Negative for difficulty urinating, flank pain, frequency and hematuria.  Musculoskeletal:  Positive for arthralgias. Negative for back pain.  Skin:  Positive for wound. Negative for rash.  Neurological:  Positive for headaches. Negative for dizziness, speech difficulty, weakness and numbness.   Physical Exam Updated Vital Signs BP (!) 161/88 (BP Location: Right Arm)   Pulse 64   Temp 98.7 F (37.1 C) (Oral)   Resp 16  Ht 5\' 8"  (1.727 m)   Wt 118.8 kg   LMP 10/15/2020   SpO2 99%   BMI 39.84 kg/m   Physical Exam Constitutional:      Appearance: She is well-developed.  HENT:     Head: Normocephalic and atraumatic.  Eyes:     Pupils: Pupils are equal, round, and reactive to light.     Comments: No photophobia  Neck:     Comments: No meningismus Cardiovascular:     Rate and Rhythm: Normal rate and regular rhythm.     Heart sounds: Normal heart sounds.  Pulmonary:     Effort: Pulmonary effort is normal. No respiratory distress.     Breath sounds: Normal breath sounds. No wheezing or rales.  Chest:     Chest wall: No tenderness.  Abdominal:     General: Bowel sounds are normal.     Palpations: Abdomen is soft.     Tenderness: There is no abdominal tenderness. There is no guarding or rebound.  Musculoskeletal:        General: Normal range of motion.     Cervical back: Normal range of motion and neck supple.     Comments: Some minor pain on range of motion of the right knee.  There is no swelling or deformity.  No effusion.  No warmth or erythema.  Neurovascular intact distally.  Lymphadenopathy:     Cervical: No cervical adenopathy.  Skin:    General: Skin is warm and dry.     Findings: No rash.  Neurological:     Mental Status: She is alert and oriented to person, place, and time.     Comments: Motor 5/5 all extremities Sensation grossly intact to LT all extremities Finger to Nose intact, no pronator drift CN II-XII grossly  intact      ED Results / Procedures / Treatments   Labs (all labs ordered are listed, but only abnormal results are displayed) Labs Reviewed  BASIC METABOLIC PANEL - Abnormal; Notable for the following components:      Result Value   Potassium 3.4 (*)    All other components within normal limits    EKG EKG Interpretation  Date/Time:  Tuesday November 09 2020 23:19:18 EDT Ventricular Rate:  75 PR Interval:  179 QRS Duration: 95 QT Interval:  380 QTC Calculation: 425 R Axis:   80 Text Interpretation: Sinus rhythm Borderline T wave abnormalities since last tracing no significant change Confirmed by [id number] [phone]) on 11/10/2020 12:43:55 AM  Radiology No results found.  Procedures Procedures   Medications Ordered in ED Medications  ketorolac (TORADOL) injection 30 mg (30 mg Intramuscular Given 11/09/20 2351)    ED Course  I have reviewed the triage vital signs and the nursing notes.  Pertinent labs & imaging results that were available during my care of the patient were reviewed by me and considered in my medical decision making (see chart for details).    MDM Rules/Calculators/A&P                          Patient is a 40 year old female who presents with elevated blood pressures and a headache.  Her blood pressure has improved without treatment in the ED although it still suboptimal and she states her blood pressures have been running high for a while.  She has a bifrontal type headache.  This completely resolved with Toradol.  She does not have any other suggestions of subarachnoid hemorrhage or meningitis.  No neurologic deficits.  Her labs are nonconcerning.  Her EKG does not show any ischemic changes.  She was discharged home in good condition.  I did give her a prescription for her prior losartan.  I encouraged her to make an appointment to follow-up with her PCP who is associated with [facility] in [facility].  Return precautions were given.  She has been having some  pain in her right knee.  No recent trauma.  No effusion or suggestions of infection.  She is requesting some medicine for it.  She tried over-the-counter medicines.  Will prescribe Voltaren gel and she can have this reassessed by her PCP. Final Clinical Impression(s) / ED Diagnoses Final diagnoses:  Hypertension, unspecified type  Acute nonintractable headache, unspecified headache type    Rx / DC Orders ED Discharge Orders          Ordered    losartan (COZAAR) 50 MG tablet  Daily        11/10/20 0146             Rolan Bucco, MD 11/10/20 8127    Rolan Bucco, MD 11/10/20 0153

## 2020-11-09 NOTE — ED Triage Notes (Signed)
Pt c/o HA x 2 days. Pt states she had EMS come out today and states her B/P was 220/110. Pt c/o pain to R arm and R leg. No neuro symptoms noted in triage.

## 2020-11-09 NOTE — ED Provider Notes (Signed)
[facility] EMERGENCY DEPARTMENT Provider Note   CSN: [id number] Arrival date & time: 11/08/20  2343     History Chief Complaint  Patient presents with   Insect Bite    Robyn [name] is a 40 y.o. female.  Presents to ER with concern for insect bite.  Patient states that she is outside frequently due to her job.  A couple days ago started noting a little area of itching and swelling on her left neck.  Swelling started today.  Has not taken any medication for it.  No redness.  No fevers.  HPI     Past Medical History:  Diagnosis Date   Anxiety    h/o panic attacks   Diabetes mellitus without complication (HCC)    diet controlled    Diverticulosis 02/16/2019   Herpes    Hypertension    Left ovarian cyst 11/29/2011   PCOS (polycystic ovarian syndrome)    Pregnancy induced hypertension    Shortness of breath dyspnea    with exertion and while pregnant   Smoker 11/29/2011    Patient Active Problem List   Diagnosis Date Noted   Tubo-ovarian abscess 08/06/2019   TOA (tubo-ovarian abscess) 08/05/2019   Colonic diverticular abscess 02/10/2019   Diverticulitis 02/09/2019   S/P repeat low transverse C-section 03/23/2015   Hypertension - on Labetalol 200 mg po bid 11/22/2014   Diabetes mellitus type 2, uncontrolled, without complications -- dx'd in 2011 11/22/2014   Hx of incompetent cervix, currently pregnant - cerclage placed on 10/09/14 by Dr. Su Hilt 11/22/2014   History of preterm delivery x 2 - weekly 17P injections - first injection at 16.5 wks 11/22/2014   Elderly multigravida 11/22/2014   PCOS (polycystic ovarian syndrome) 08/21/2014   Herpes 08/21/2014   ANA positive 08/21/2014   Previous cesarean delivery, antepartum condition or complication x 2--previous vertical incision 08/21/2014   Obesity 05/20/2012    Past Surgical History:  Procedure Laterality Date   CERVICAL CERCLAGE N/A 10/09/2014   Procedure: CERCLAGE CERVICAL;  Surgeon: Osborn Coho, MD;   Location: [facility];  Service: Gynecology;  Laterality: N/A;   CESAREAN SECTION     C/S x 2   CESAREAN SECTION WITH BILATERAL TUBAL LIGATION Bilateral 03/23/2015   Procedure: CESAREAN SECTION WITH BILATERAL TUBAL LIGATION;  Surgeon: Osborn Coho, MD;  Location: [facility];  Service: Obstetrics;  Laterality: Bilateral;  Amt OK'd to move 02/25/2015   FLEXIBLE SIGMOIDOSCOPY N/A 05/08/2019   Procedure: FLEXIBLE SIGMOIDOSCOPY;  Surgeon: Romie Levee, MD;  Location: [facility];  Service: Endoscopy;  Laterality: N/A;   IR RADIOLOGIST EVAL & MGMT  02/20/2019   IR RADIOLOGIST EVAL & MGMT  03/06/2019   IR RADIOLOGIST EVAL & MGMT  08/21/2019   WISDOM TOOTH EXTRACTION       OB History     Gravida  5   Para  3   Term      Preterm  3   AB  2   Living  3      SAB  1   IAB  1   Ectopic      Multiple  0   Live Births  3           Family History  Problem Relation Age of Onset   Hypertension Mother    Diabetes Father    Stroke Father    Cancer Maternal Grandmother    Cancer Maternal Uncle     Social History   Tobacco Use   Smoking status: Every  Day    Packs/day: 0.25    Years: 13.00    Pack years: 3.25    Types: Cigarettes   Smokeless tobacco: Never  Vaping Use   Vaping Use: Never used  Substance Use Topics   Alcohol use: Yes    Comment: socially and rarely   Drug use: No    Home Medications Prior to Admission medications   Medication Sig Start Date End Date Taking? Authorizing Provider  cephALEXin (KEFLEX) 500 MG capsule Take 1 capsule (500 mg total) by mouth 3 (three) times daily for 7 days. 11/09/20 11/16/20 Yes Adamaris King, Quitman Livings, MD  hydrochlorothiazide (HYDRODIURIL) 25 MG tablet Take 25 mg by mouth daily. 01/24/19   [provider]  HYDROcodone-acetaminophen (NORCO/VICODIN) 5-325 MG tablet Take 1 tablet by mouth every 6 (six) hours as needed. 08/31/20   Henderly, Britni A, PA-C  lidocaine (LIDODERM) 5 % Place 1 patch onto the skin daily. Remove & Discard  patch within 12 hours or as directed by MD 02/14/20   Nicanor Alcon, April, MD  losartan (COZAAR) 50 MG tablet Take 50 mg by mouth daily. 01/24/19   [provider]  metFORMIN (GLUCOPHAGE-XR) 500 MG 24 hr tablet Take 1,000 mg by mouth 2 (two) times daily. 12/23/18   [provider]  methocarbamol (ROBAXIN) 500 MG tablet Take 1 tablet (500 mg total) by mouth 2 (two) times daily. 01/09/20   Maxwell Caul, PA-C  naproxen (NAPROSYN) 500 MG tablet Take 1 tablet (500 mg total) by mouth 2 (two) times daily. 08/31/20   Henderly, Britni A, PA-C  NOVOLIN 70/30 RELION (70-30) 100 UNIT/ML injection Inject 15 Units into the skin 2 (two) times daily between meals as needed (if blood sugar is high).  07/17/19   [provider]    Allergies    Patient has no known allergies.  Review of Systems   Review of Systems  Constitutional:  Negative for chills and fever.  HENT:  Negative for ear pain and sore throat.   Eyes:  Negative for pain and visual disturbance.  Respiratory:  Negative for cough and shortness of breath.   Cardiovascular:  Negative for chest pain and palpitations.  Gastrointestinal:  Negative for abdominal pain and vomiting.  Genitourinary:  Negative for dysuria and hematuria.  Musculoskeletal:  Positive for neck pain. Negative for arthralgias and back pain.  Skin:  Positive for wound. Negative for color change and rash.  Neurological:  Negative for seizures and syncope.  All other systems reviewed and are negative.  Physical Exam Updated Vital Signs BP (!) 180/102   Pulse 80   Temp 98.3 F (36.8 C) (Oral)   Resp 17   Ht 5\' 8"  (1.727 m)   Wt 117.9 kg   SpO2 100%   BMI 39.53 kg/m   Physical Exam Vitals and nursing note reviewed.  Constitutional:      General: She is not in acute distress.    Appearance: She is well-developed.  HENT:     Head: Normocephalic and atraumatic.  Eyes:     Conjunctiva/sclera: Conjunctivae normal.  Cardiovascular:     Rate and  Rhythm: Normal rate and regular rhythm.     Heart sounds: No murmur heard. Pulmonary:     Effort: Pulmonary effort is normal. No respiratory distress.     Breath sounds: Normal breath sounds.  Abdominal:     Palpations: Abdomen is soft.     Tenderness: There is no abdominal tenderness.  Musculoskeletal:     Cervical back: Neck  supple.  Skin:    General: Skin is warm and dry.     Comments: There is approximately 1 cm diameter lesion over the left lateral neck, slightly raised, no significant induration or fluctuance to suggest abscess formation, no erythema  Neurological:     Mental Status: She is alert.    ED Results / Procedures / Treatments   Labs (all labs ordered are listed, but only abnormal results are displayed) Labs Reviewed - No data to display  EKG None  Radiology No results found.  Procedures Procedures   Medications Ordered in ED Medications - No data to display  ED Course  I have reviewed the triage vital signs and the nursing notes.  Pertinent labs & imaging results that were available during my care of the patient were reviewed by me and considered in my medical decision making (see chart for details).    MDM Rules/Calculators/A&P                          40 year old lady presenting to ER with concern for possible insect bite down her left neck.  On exam she has but a 1 cm diameter area of slight swelling/raised skin.  No significant induration or fluctuance to suggest abscess formation.  Nothing to drain today.  Suspect either allergic reaction given itching versus very early infection.  Will prescribe course of Keflex and recommend recheck with primary doctor.  Reviewed return precautions and discharged home.  After the discussed management above, the patient was determined to be safe for discharge.  The patient was in agreement with this plan and all questions regarding their care were answered.  ED return precautions were discussed and the patient will  return to the ED with any significant worsening of condition.  Final Clinical Impression(s) / ED Diagnoses Final diagnoses:  Insect bite of neck, initial encounter    Rx / DC Orders ED Discharge Orders          Ordered    cephALEXin (KEFLEX) 500 MG capsule  3 times daily        11/09/20 0022             Milagros Loll, MD 11/09/20 [id number]

## 2020-11-09 NOTE — Discharge Instructions (Addendum)
Recommend following up with a primary care doctor as well as a dentist.  Take antibiotic as prescribed.  Recommend warm compresses 3-4 times daily.  Take Benadryl as needed for itching.  If you develop worsening swelling, redness, fever, come back to ER for reassessment.

## 2020-11-10 LAB — BASIC METABOLIC PANEL
Anion gap: 10 (ref 5–15)
BUN: 12 mg/dL (ref 6–20)
CO2: 25 mmol/L (ref 22–32)
Calcium: 9.7 mg/dL (ref 8.9–10.3)
Chloride: 106 mmol/L (ref 98–111)
Creatinine, Ser: 0.5 mg/dL (ref 0.44–1.00)
GFR, Estimated: >60 mL/min (ref >60)
Glucose, Bld: 90 mg/dL (ref 70–99)
Potassium: 3.4 mmol/L — ABNORMAL LOW (ref 3.5–5.1)
Sodium: 141 mmol/L (ref 135–145)

## 2020-11-10 MED ORDER — LOSARTAN POTASSIUM 50 MG PO TABS
50.0000 mg | ORAL_TABLET | Freq: Every day | ORAL | 0 refills | Status: DC
Start: 2020-11-10 — End: 2021-04-01

## 2020-11-10 MED ORDER — DICLOFENAC SODIUM 1 % EX GEL: 4.0000 g | Freq: Four times a day (QID) | CUTANEOUS | 0 refills | Status: DC

## 2020-12-22 ENCOUNTER — Encounter (HOSPITAL_BASED_OUTPATIENT_CLINIC_OR_DEPARTMENT_OTHER): Payer: Self-pay | Admitting: Emergency Medicine

## 2020-12-22 ENCOUNTER — Emergency Department (HOSPITAL_BASED_OUTPATIENT_CLINIC_OR_DEPARTMENT_OTHER)
Admission: EM | Admit: 2020-12-22 | Discharge: 2020-12-22 | Disposition: A | Discharge status: Home / Self Care | Payer: Self-pay | Attending: Emergency Medicine | Admitting: Emergency Medicine

## 2020-12-22 ENCOUNTER — Other Ambulatory Visit: Payer: Self-pay

## 2020-12-22 ENCOUNTER — Emergency Department (HOSPITAL_BASED_OUTPATIENT_CLINIC_OR_DEPARTMENT_OTHER): Payer: Self-pay

## 2020-12-22 DIAGNOSIS — M1711 Unilateral primary osteoarthritis, right knee: Secondary | ICD-10-CM | POA: Insufficient documentation

## 2020-12-22 DIAGNOSIS — E119 Type 2 diabetes mellitus without complications: Secondary | ICD-10-CM | POA: Insufficient documentation

## 2020-12-22 DIAGNOSIS — Z79899 Other long term (current) drug therapy: Secondary | ICD-10-CM | POA: Insufficient documentation

## 2020-12-22 DIAGNOSIS — M1710 Unilateral primary osteoarthritis, unspecified knee: Secondary | ICD-10-CM

## 2020-12-22 DIAGNOSIS — Z7984 Long term (current) use of oral hypoglycemic drugs: Secondary | ICD-10-CM | POA: Insufficient documentation

## 2020-12-22 DIAGNOSIS — I10 Essential (primary) hypertension: Secondary | ICD-10-CM | POA: Insufficient documentation

## 2020-12-22 DIAGNOSIS — F17210 Nicotine dependence, cigarettes, uncomplicated: Secondary | ICD-10-CM | POA: Insufficient documentation

## 2020-12-22 NOTE — ED Provider Notes (Signed)
[facility] EMERGENCY DEPARTMENT Provider Note   CSN: [id number] Arrival date & time: 12/22/20  1102     History Chief Complaint  Patient presents with   Knee Pain    Robyn [name] is a 40 y.o. female.  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  The history is provided by the patient.  Knee Pain Location:  Knee and leg Injury: no   Leg location:  R lower leg Knee location:  R knee Pain details:    Quality:  Throbbing   Radiates to:  Does not radiate   Severity:  Severe   Onset quality:  Gradual   Duration:  4 days   Timing:  Constant   Progression:  Worsening Chronicity:  Recurrent Prior injury to area:  No Relieved by:  Rest Worsened by:  Bearing weight and flexion Ineffective treatments:  Acetaminophen Associated symptoms: no back pain and no fever       Past Medical History:  Diagnosis Date   Anxiety    h/o panic attacks   Diabetes mellitus without complication (HCC)    diet controlled    Diverticulosis 02/16/2019   Herpes    Hypertension    Left ovarian cyst 11/29/2011   PCOS (polycystic ovarian syndrome)    Pregnancy induced hypertension    Shortness of breath dyspnea    with exertion and while pregnant   Smoker 11/29/2011    Patient Active Problem List   Diagnosis Date Noted   Tubo-ovarian abscess 08/06/2019   TOA (tubo-ovarian abscess) 08/05/2019   Colonic diverticular abscess 02/10/2019   Diverticulitis 02/09/2019   S/P repeat low transverse C-section 03/23/2015   Hypertension - on Labetalol 200 mg po bid 11/22/2014   Diabetes mellitus type 2, uncontrolled, without complications -- dx'd in 2011 11/22/2014   Hx of incompetent cervix, currently pregnant - cerclage placed on 10/09/14 by Dr. Su Hilt 11/22/2014   History of preterm delivery x 2 - weekly 17P injections - first injection at 16.5 wks 11/22/2014   Elderly multigravida 11/22/2014   PCOS (polycystic ovarian syndrome) 08/21/2014   Herpes 08/21/2014    ANA positive 08/21/2014   Previous cesarean delivery, antepartum condition or complication x 2--previous vertical incision 08/21/2014   Obesity 05/20/2012    Past Surgical History:  Procedure Laterality Date   CERVICAL CERCLAGE N/A 10/09/2014   Procedure: CERCLAGE CERVICAL;  Surgeon: Osborn Coho, MD;  Location: [facility];  Service: Gynecology;  Laterality: N/A;   CESAREAN SECTION     C/S x 2   CESAREAN SECTION WITH BILATERAL TUBAL LIGATION Bilateral 03/23/2015   Procedure: CESAREAN SECTION WITH BILATERAL TUBAL LIGATION;  Surgeon: Osborn Coho, MD;  Location: [facility];  Service: Obstetrics;  Laterality: Bilateral;  Amt OK'd to move 02/25/2015   FLEXIBLE SIGMOIDOSCOPY N/A 05/08/2019   Procedure: FLEXIBLE SIGMOIDOSCOPY;  Surgeon: Romie Levee, MD;  Location: [facility];  Service: Endoscopy;  Laterality: N/A;   IR RADIOLOGIST EVAL & MGMT  02/20/2019   IR RADIOLOGIST EVAL & MGMT  03/06/2019   IR RADIOLOGIST EVAL & MGMT  08/21/2019   WISDOM TOOTH EXTRACTION       OB History     Gravida  5   Para  3   Term      Preterm  3   AB  2   Living  3      SAB  1   IAB  1   Ectopic      Multiple  0  Live Births  3           Family History  Problem Relation Age of Onset   Hypertension Mother    Diabetes Father    Stroke Father    Cancer Maternal Grandmother    Cancer Maternal Uncle     Social History   Tobacco Use   Smoking status: Every Day    Packs/day: 0.25    Years: 13.00    Pack years: 3.25    Types: Cigarettes   Smokeless tobacco: Never  Vaping Use   Vaping Use: Never used  Substance Use Topics   Alcohol use: Yes    Comment: socially and rarely   Drug use: No    Home Medications Prior to Admission medications   Medication Sig Start Date End Date Taking? Authorizing Provider  diclofenac Sodium (VOLTAREN) 1 % GEL Apply 4 g topically 4 (four) times daily. 11/10/20   Rolan Bucco, MD  hydrochlorothiazide (HYDRODIURIL) 25 MG tablet Take 25 mg by mouth  daily. 01/24/19   [provider]  HYDROcodone-acetaminophen (NORCO/VICODIN) 5-325 MG tablet Take 1 tablet by mouth every 6 (six) hours as needed. 08/31/20   Henderly, Britni A, PA-C  lidocaine (LIDODERM) 5 % Place 1 patch onto the skin daily. Remove & Discard patch within 12 hours or as directed by MD 02/14/20   Nicanor Alcon, April, MD  losartan (COZAAR) 50 MG tablet Take 1 tablet (50 mg total) by mouth daily. 11/10/20   Rolan Bucco, MD  metFORMIN (GLUCOPHAGE-XR) 500 MG 24 hr tablet Take 1,000 mg by mouth 2 (two) times daily. 12/23/18   [provider]  methocarbamol (ROBAXIN) 500 MG tablet Take 1 tablet (500 mg total) by mouth 2 (two) times daily. 01/09/20   Maxwell Caul, PA-C  naproxen (NAPROSYN) 500 MG tablet Take 1 tablet (500 mg total) by mouth 2 (two) times daily. 08/31/20   Henderly, Britni A, PA-C  NOVOLIN 70/30 RELION (70-30) 100 UNIT/ML injection Inject 15 Units into the skin 2 (two) times daily between meals as needed (if blood sugar is high).  07/17/19   [provider]    Allergies    Patient has no known allergies.  Review of Systems   Review of Systems  Constitutional:  Negative for chills and fever.  HENT:  Negative for ear pain and sore throat.   Eyes:  Negative for pain and visual disturbance.  Respiratory:  Negative for cough and shortness of breath.   Cardiovascular:  Negative for chest pain and palpitations.  Gastrointestinal:  Negative for abdominal pain and vomiting.  Genitourinary:  Negative for dysuria and hematuria.  Musculoskeletal:  Negative for arthralgias and back pain.  Skin:  Negative for color change and rash.  Neurological:  Negative for seizures, syncope and numbness.  All other systems reviewed and are negative.  Physical Exam Updated Vital Signs BP 138/90 (BP Location: Right Arm)   Pulse 88   Temp 98.5 F (36.9 C) (Oral)   Resp 18   Ht 5\' 8"  (1.727 m)   Wt 118.8 kg   LMP 12/15/2020   SpO2 99%   BMI 39.84 kg/m    Physical Exam Vitals and nursing note reviewed.  HENT:     Head: Normocephalic and atraumatic.  Eyes:     General: No scleral icterus. Pulmonary:     Effort: Pulmonary effort is normal. No respiratory distress.  Musculoskeletal:     Cervical back: Normal range of motion.     Comments: Knee is normal to  inspection.  There is a 1+ effusion.  Range of motion is from 0 to 30 degrees, and past 30 degrees of flexion there is quite a bit of pain.  She has most of her tenderness at the lateral joint line.  No hip range of motion tenderness.  Provocative testing was not performed secondary to her pain.  Otherwise, the limb is warm and well-perfused.  Skin:    General: Skin is warm and dry.  Neurological:     General: No focal deficit present.     Mental Status: She is alert and oriented to person, place, and time.  Psychiatric:        Mood and Affect: Mood normal.    ED Results / Procedures / Treatments   Labs (all labs ordered are listed, but only abnormal results are displayed) Labs Reviewed - No data to display  EKG None  Radiology DG Tibia/Fibula Right  Result Date: 12/22/2020 CLINICAL DATA:  Evaluate for stress fracture. Lateral to mid right knee pain which radiates down to the shin. EXAM: RIGHT TIBIA AND FIBULA - 2 VIEW; RIGHT KNEE - COMPLETE 4+ VIEW COMPARISON:  01/09/2020 FINDINGS: Right knee: Minimal spurring of the medial and lateral compartments. No fracture or dislocation. Right tibia and fibula: No fracture, dislocation, or soft tissue abnormalities. IMPRESSION: No fracture or dislocation of the right knee or lower leg. If there is continued concern for stress fracture, further evaluation with MRI or bone scan should be considered. Electronically Signed   By: Acquanetta BellingFarhaan  Mir M.D.   On: 12/22/2020 12:59   DG Knee Complete 4 Views Right  Result Date: 12/22/2020 CLINICAL DATA:  Evaluate for stress fracture. Lateral to mid right knee pain which radiates down to the shin. EXAM:  RIGHT TIBIA AND FIBULA - 2 VIEW; RIGHT KNEE - COMPLETE 4+ VIEW COMPARISON:  01/09/2020 FINDINGS: Right knee: Minimal spurring of the medial and lateral compartments. No fracture or dislocation. Right tibia and fibula: No fracture, dislocation, or soft tissue abnormalities. IMPRESSION: No fracture or dislocation of the right knee or lower leg. If there is continued concern for stress fracture, further evaluation with MRI or bone scan should be considered. Electronically Signed   By: Acquanetta BellingFarhaan  Mir M.D.   On: 12/22/2020 12:59    Procedures Procedures   Medications Ordered in ED Medications - No data to display  ED Course  I have reviewed the triage vital signs and the nursing notes.  Pertinent labs & imaging results that were available during my care of the patient were reviewed by me and considered in my medical decision making (see chart for details).    MDM Rules/Calculators/A&P                           [name] has atraumatic right knee pain radiating to her right lower leg.  She does a lot of walking for [employer].  Exam and x-rays are consistent with mild degenerative joint disease.  Degenerative meniscal tear would be another possibility.  Symptoms do not seem radicular.  No indication of an inflammatory or infectious etiology.  I recommended symptomatic treatment, sports med referral, and possibly conservative measures such as physical therapy or injections.  She was given a few days off from work so she can treat her leg symptomatically. Final Clinical Impression(s) / ED Diagnoses Final diagnoses:  Arthritis of knee    Rx / DC Orders ED Discharge Orders     None  Koleen Distance, MD 12/22/20 1321

## 2020-12-22 NOTE — ED Triage Notes (Signed)
Patient complaining of right knee pain. Chronic pain but now radiating down shin. Patient is a Paramedic.

## 2021-01-13 ENCOUNTER — Emergency Department (HOSPITAL_BASED_OUTPATIENT_CLINIC_OR_DEPARTMENT_OTHER): Payer: Self-pay

## 2021-01-13 ENCOUNTER — Emergency Department (HOSPITAL_BASED_OUTPATIENT_CLINIC_OR_DEPARTMENT_OTHER)
Admission: EM | Admit: 2021-01-13 | Discharge: 2021-01-14 | Disposition: A | Discharge status: Home / Self Care | Payer: Self-pay | Attending: Emergency Medicine | Admitting: Emergency Medicine

## 2021-01-13 ENCOUNTER — Encounter (HOSPITAL_BASED_OUTPATIENT_CLINIC_OR_DEPARTMENT_OTHER): Payer: Self-pay | Admitting: Emergency Medicine

## 2021-01-13 ENCOUNTER — Other Ambulatory Visit: Payer: Self-pay

## 2021-01-13 DIAGNOSIS — Z794 Long term (current) use of insulin: Secondary | ICD-10-CM | POA: Insufficient documentation

## 2021-01-13 DIAGNOSIS — U071 COVID-19: Secondary | ICD-10-CM | POA: Insufficient documentation

## 2021-01-13 DIAGNOSIS — E119 Type 2 diabetes mellitus without complications: Secondary | ICD-10-CM | POA: Insufficient documentation

## 2021-01-13 DIAGNOSIS — F17210 Nicotine dependence, cigarettes, uncomplicated: Secondary | ICD-10-CM | POA: Insufficient documentation

## 2021-01-13 DIAGNOSIS — Z7984 Long term (current) use of oral hypoglycemic drugs: Secondary | ICD-10-CM | POA: Insufficient documentation

## 2021-01-13 DIAGNOSIS — Z79899 Other long term (current) drug therapy: Secondary | ICD-10-CM | POA: Insufficient documentation

## 2021-01-13 DIAGNOSIS — K047 Periapical abscess without sinus: Secondary | ICD-10-CM | POA: Insufficient documentation

## 2021-01-13 DIAGNOSIS — R0781 Pleurodynia: Secondary | ICD-10-CM

## 2021-01-13 DIAGNOSIS — I10 Essential (primary) hypertension: Secondary | ICD-10-CM | POA: Insufficient documentation

## 2021-01-13 LAB — CBC WITH DIFFERENTIAL/PLATELET
Abs Immature Granulocytes: 0.03 10*3/uL (ref 0.00–0.07)
Basophils Absolute: 0 10*3/uL (ref 0.0–0.1)
Basophils Relative: 0 %
Eosinophils Absolute: 0.1 10*3/uL (ref 0.0–0.5)
Eosinophils Relative: 1 %
HCT: 40.7 % (ref 36.0–46.0)
Hemoglobin: 13.4 g/dL (ref 12.0–15.0)
Immature Granulocytes: 0 %
Lymphocytes Relative: 32 %
Lymphs Abs: 3.3 10*3/uL (ref 0.7–4.0)
MCH: 28.1 pg (ref 26.0–34.0)
MCHC: 32.9 g/dL (ref 30.0–36.0)
MCV: 85.3 fL (ref 80.0–100.0)
Monocytes Absolute: 0.7 10*3/uL (ref 0.1–1.0)
Monocytes Relative: 7 %
Neutro Abs: 6 10*3/uL (ref 1.7–7.7)
Neutrophils Relative %: 60 %
Platelets: 243 10*3/uL (ref 150–400)
RBC: 4.77 MIL/uL (ref 3.87–5.11)
RDW: 14.3 % (ref 11.5–15.5)
WBC: 10.1 10*3/uL (ref 4.0–10.5)
nRBC: 0 % (ref 0.0–0.2)

## 2021-01-13 NOTE — ED Triage Notes (Signed)
Patient arrived via POV c/o chest/right shoulder pain x 1 days. Patient states sharp pain with deep breath on right side of chest. Patient states 8/10 pain. Patient is AO x 4, VS w/ elevated temp and BP, normal gait.

## 2021-01-13 NOTE — ED Provider Notes (Signed)
[facility] Provider Note: Lowella Dell, MD, FACEP  CSN: [id number] MRN: [id number] ARRIVAL: 01/13/21 at 2236 ROOM: [facility]01/[facility]01   CHIEF COMPLAINT  Chest Pain   HISTORY OF PRESENT ILLNESS  01/13/21 11:55 PM Robyn [name] is a 40 y.o. female who has had chest pain since yesterday.  The pain is located in her right upper chest.  It is sharp and pleuritic in nature.  She rates it as an 8 out of 10, worse with deep breathing.  The pain radiates to her right neck and her right shoulder.  The pain is not worse with movement of her neck.  She denies any cough, shortness of breath, fever (although her temperature was noted to be 100 on arrival) or body aches.  She has had no pain or swelling in her legs.  She is not on exogenous estrogen.  She has not had any recent travel.  She does smoke.  He tested positive for COVID about a week ago.  She also has a carious tooth on the left lower that she believes is infected.   Past Medical History:  Diagnosis Date   Anxiety    h/o panic attacks   Diabetes mellitus without complication (HCC)    diet controlled    Diverticulosis 02/16/2019   Herpes    Hypertension    Left ovarian cyst 11/29/2011   PCOS (polycystic ovarian syndrome)    Pregnancy induced hypertension    Shortness of breath dyspnea    with exertion and while pregnant   Smoker 11/29/2011    Past Surgical History:  Procedure Laterality Date   CERVICAL CERCLAGE N/A 10/09/2014   Procedure: CERCLAGE CERVICAL;  Surgeon: Osborn Coho, MD;  Location: [facility];  Service: Gynecology;  Laterality: N/A;   CESAREAN SECTION     C/S x 2   CESAREAN SECTION WITH BILATERAL TUBAL LIGATION Bilateral 03/23/2015   Procedure: CESAREAN SECTION WITH BILATERAL TUBAL LIGATION;  Surgeon: Osborn Coho, MD;  Location: [facility];  Service: Obstetrics;  Laterality: Bilateral;  Amt OK'd to move 02/25/2015   FLEXIBLE SIGMOIDOSCOPY N/A 05/08/2019   Procedure: FLEXIBLE SIGMOIDOSCOPY;  Surgeon: Romie Levee, MD;  Location: [facility];  Service: Endoscopy;  Laterality: N/A;   IR RADIOLOGIST EVAL & MGMT  02/20/2019   IR RADIOLOGIST EVAL & MGMT  03/06/2019   IR RADIOLOGIST EVAL & MGMT  08/21/2019   WISDOM TOOTH EXTRACTION      Family History  Problem Relation Age of Onset   Hypertension Mother    Diabetes Father    Stroke Father    Cancer Maternal Grandmother    Cancer Maternal Uncle     Social History   Tobacco Use   Smoking status: Every Day    Packs/day: 0.25    Years: 13.00    Pack years: 3.25    Types: Cigarettes   Smokeless tobacco: Never  Vaping Use   Vaping Use: Never used  Substance Use Topics   Alcohol use: Yes    Comment: socially and rarely   Drug use: No    Prior to Admission medications   Medication Sig Start Date End Date Taking? Authorizing Provider  diclofenac Sodium (VOLTAREN) 1 % GEL Apply 4 g topically 4 (four) times daily. 11/10/20   Rolan Bucco, MD  hydrochlorothiazide (HYDRODIURIL) 25 MG tablet Take 25 mg by mouth daily. 01/24/19   [provider]  HYDROcodone-acetaminophen (NORCO/VICODIN) 5-325 MG tablet Take 1 tablet by mouth every 6 (six) hours as needed. 08/31/20   Henderly,  Britni A, PA-C  lidocaine (LIDODERM) 5 % Place 1 patch onto the skin daily. Remove & Discard patch within 12 hours or as directed by MD 02/14/20   Nicanor Alcon, April, MD  losartan (COZAAR) 50 MG tablet Take 1 tablet (50 mg total) by mouth daily. 11/10/20   Rolan Bucco, MD  metFORMIN (GLUCOPHAGE-XR) 500 MG 24 hr tablet Take 1,000 mg by mouth 2 (two) times daily. 12/23/18   [provider]  methocarbamol (ROBAXIN) 500 MG tablet Take 1 tablet (500 mg total) by mouth 2 (two) times daily. 01/09/20   Maxwell Caul, PA-C  naproxen (NAPROSYN) 500 MG tablet Take 1 tablet (500 mg total) by mouth 2 (two) times daily. 08/31/20   Henderly, Britni A, PA-C  NOVOLIN 70/30 RELION (70-30) 100 UNIT/ML injection Inject 15 Units into the skin 2 (two) times daily between meals as  needed (if blood sugar is high).  07/17/19   [provider]    Allergies Patient has no known allergies.   REVIEW OF SYSTEMS  Negative except as noted here or in the History of Present Illness.   PHYSICAL EXAMINATION  Initial Vital Signs Blood pressure (!) 172/92, pulse 81, temperature 100 F (37.8 C), temperature source Oral, resp. rate 18, height 5\' 7"  (1.702 m), weight 123.7 kg, last menstrual period 12/15/2020, SpO2 100 %.  Examination General: Well-developed, well-nourished female in no acute distress; appearance consistent with age of record HENT: normocephalic; atraumatic; left lower premolar carious to the gumline with inflammatory changes of the adjacent gum Eyes: pupils equal, round and reactive to light; extraocular muscles intact Neck: supple Heart: regular rate and rhythm Lungs: clear to auscultation bilaterally Chest: Nontender Abdomen: soft; nondistended; nontender; bowel sounds present Extremities: No deformity; full range of motion; pulses normal; no edema Neurologic: Awake, alert and oriented; motor function intact in all extremities and symmetric; no facial droop Skin: Warm and dry Psychiatric: Normal mood and affect   RESULTS  Summary of this visit's results, reviewed and interpreted by myself:   EKG Interpretation  Date/Time:  Thursday January 13 2021 22:56:38 EDT Ventricular Rate:  83 PR Interval:  180 QRS Duration: 92 QT Interval:  344 QTC Calculation: 404 R Axis:   78 Text Interpretation: Normal sinus rhythm Nonspecific ST and T wave abnormality Abnormal ECG No significant change was found Confirmed by [id number] (Paula Libra) on 01/13/2021 11:12:43 PM       Laboratory Studies: Results for orders placed or performed during the hospital encounter of 01/13/21 (from the past 24 hour(s))  Basic metabolic panel     Status: Abnormal   Collection Time: 01/13/21 11:39 PM  Result Value Ref Range   Sodium 137 135 - 145 mmol/L   Potassium 3.4 (L)  3.5 - 5.1 mmol/L   Chloride 105 98 - 111 mmol/L   CO2 25 22 - 32 mmol/L   Glucose, Bld 129 (H) 70 - 99 mg/dL   BUN 14 6 - 20 mg/dL   Creatinine, Ser 01/15/21 0.44 - 1.00 mg/dL   Calcium 9.2 8.9 - 7.16 mg/dL   GFR, Estimated 96.7 >89 mL/min   Anion gap 7 5 - 15  Troponin I (High Sensitivity)     Status: None   Collection Time: 01/13/21 11:39 PM  Result Value Ref Range   Troponin I (High Sensitivity) 2 <18 ng/L  Resp Panel by RT-PCR (Flu A&B, Covid) Nasopharyngeal Swab     Status: Abnormal   Collection Time: 01/13/21 11:39 PM   Specimen: Nasopharyngeal Swab; Nasopharyngeal(NP) swabs  in vial transport medium  Result Value Ref Range   SARS Coronavirus 2 by RT PCR POSITIVE (A) NEGATIVE   Influenza A by PCR NEGATIVE NEGATIVE   Influenza B by PCR NEGATIVE NEGATIVE  CBC with Differential/Platelet     Status: None   Collection Time: 01/13/21 11:39 PM  Result Value Ref Range   WBC 10.1 4.0 - 10.5 K/uL   RBC 4.77 3.87 - 5.11 MIL/uL   Hemoglobin 13.4 12.0 - 15.0 g/dL   HCT 11.9 41.7 - 40.8 %   MCV 85.3 80.0 - 100.0 fL   MCH 28.1 26.0 - 34.0 pg   MCHC 32.9 30.0 - 36.0 g/dL   RDW 14.4 81.8 - 56.3 %   Platelets 243 150 - 400 K/uL   nRBC 0.0 0.0 - 0.2 %   Neutrophils Relative % 60 %   Neutro Abs 6.0 1.7 - 7.7 K/uL   Lymphocytes Relative 32 %   Lymphs Abs 3.3 0.7 - 4.0 K/uL   Monocytes Relative 7 %   Monocytes Absolute 0.7 0.1 - 1.0 K/uL   Eosinophils Relative 1 %   Eosinophils Absolute 0.1 0.0 - 0.5 K/uL   Basophils Relative 0 %   Basophils Absolute 0.0 0.0 - 0.1 K/uL   Immature Granulocytes 0 %   Abs Immature Granulocytes 0.03 0.00 - 0.07 K/uL   Imaging Studies: DG Chest 2 View  Result Date: 01/13/2021 CLINICAL DATA:  Chest/right shoulder pain. EXAM: CHEST - 2 VIEW COMPARISON:  February 14, 2020 FINDINGS: The heart size and mediastinal contours are within normal limits. Both lungs are clear. The visualized skeletal structures are unremarkable. IMPRESSION: No active cardiopulmonary  disease. Electronically Signed   By: Aram Candela M.D.   On: 01/13/2021 23:24    ED COURSE and MDM  Nursing notes, initial and subsequent vitals signs, including pulse oximetry, reviewed and interpreted by myself.  Vitals:   01/13/21 2247 01/13/21 2256 01/13/21 2338 01/14/21 0045  BP: (!) 172/92   (!) 175/87  Pulse: 81   65  Resp: 18   20  Temp: 100 F (37.8 C)     TempSrc: Oral     SpO2: 100%   98%  Weight:  118.8 kg 123.7 kg   Height:  5\' 7"  (1.702 m)     Medications  amoxicillin (AMOXIL) capsule 500 mg (has no administration in time range)  ketorolac (TORADOL) 15 MG/ML injection 15 mg (15 mg Intravenous Given 01/14/21 0030)    Geneva Score for PE: 0 PERC: <2% Wells Score: 0  1:06 AM Patient's pain significantly improved after IV Toradol.  I suspect this is pleuritic pain related to her recent COVID illness.  For her likely dental infection we will start her on amoxicillin and refer to dentistry.   PROCEDURES  Procedures   ED DIAGNOSES     ICD-10-CM   1. Pleuritic chest pain  R07.81     2. Dental infection  K04.7          Oona Trammel, 01/16/21, MD 01/14/21 [id number]

## 2021-01-14 LAB — RESP PANEL BY RT-PCR (FLU A&B, COVID) ARPGX2
Influenza A by PCR: NEGATIVE
Influenza B by PCR: NEGATIVE
SARS Coronavirus 2 by RT PCR: POSITIVE — AB

## 2021-01-14 LAB — BASIC METABOLIC PANEL
Anion gap: 7 (ref 5–15)
BUN: 14 mg/dL (ref 6–20)
CO2: 25 mmol/L (ref 22–32)
Calcium: 9.2 mg/dL (ref 8.9–10.3)
Chloride: 105 mmol/L (ref 98–111)
Creatinine, Ser: 0.69 mg/dL (ref 0.44–1.00)
GFR, Estimated: >60 mL/min (ref >60)
Glucose, Bld: 129 mg/dL — ABNORMAL HIGH (ref 70–99)
Potassium: 3.4 mmol/L — ABNORMAL LOW (ref 3.5–5.1)
Sodium: 137 mmol/L (ref 135–145)

## 2021-01-14 LAB — TROPONIN I (HIGH SENSITIVITY): Troponin I (High Sensitivity): 2 ng/L (ref <18)

## 2021-01-14 MED ORDER — NAPROXEN 375 MG PO TABS: ORAL_TABLET | ORAL | 0 refills | Status: DC

## 2021-01-14 MED ORDER — KETOROLAC TROMETHAMINE 15 MG/ML IJ SOLN
15.0000 mg | Freq: Once | INTRAMUSCULAR | Status: AC
  Administered 2021-01-14: 15 mg via INTRAVENOUS
  Filled 2021-01-14: qty 1

## 2021-01-14 MED ORDER — FLUCONAZOLE 150 MG PO TABS: ORAL_TABLET | ORAL | 0 refills | Status: DC

## 2021-01-14 MED ORDER — AMOXICILLIN 500 MG PO CAPS
500.0000 mg | ORAL_CAPSULE | Freq: Once | ORAL | Status: AC
  Administered 2021-01-14: 500 mg via ORAL
  Filled 2021-01-14: qty 1

## 2021-01-14 MED ORDER — AMOXICILLIN 500 MG PO CAPS: 500.0000 mg | ORAL_CAPSULE | Freq: Three times a day (TID) | ORAL | 0 refills | Status: DC

## 2021-02-16 IMAGING — CT CT ABD-PELV W/ CM
2 of 5 series · 16 of 46 positions shown, 18 images · IV contrast (Omnipaque)
Comparison: 08/02/2019

CLINICAL DATA: Worsening abdominal pain and diarrhea.
Diverticulitis.

EXAM:
CT ABDOMEN AND PELVIS WITH CONTRAST
TECHNIQUE: Multidetector CT imaging of the abdomen and pelvis was performed
using the standard protocol following bolus administration of
intravenous contrast.
CONTRAST:  100mL OMNIPAQUE IOHEXOL 300 MG/ML  SOLN

[Series 2: axial st · axial · 0.98mm/px · z∈[+829,+1264]mm · 13 of 99 slices shown, 15 images]
[im 6/99  soft-tissue]
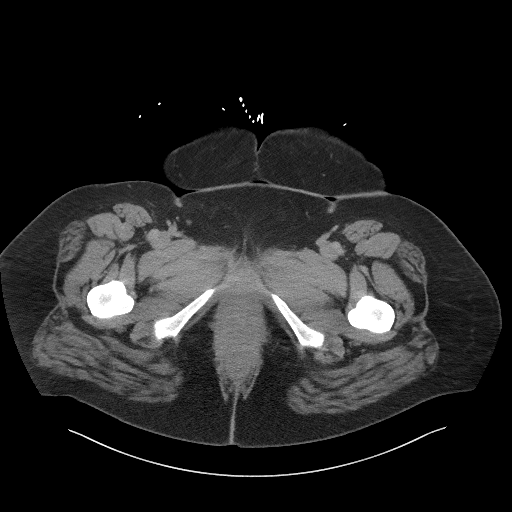
[im 6/99  bone]
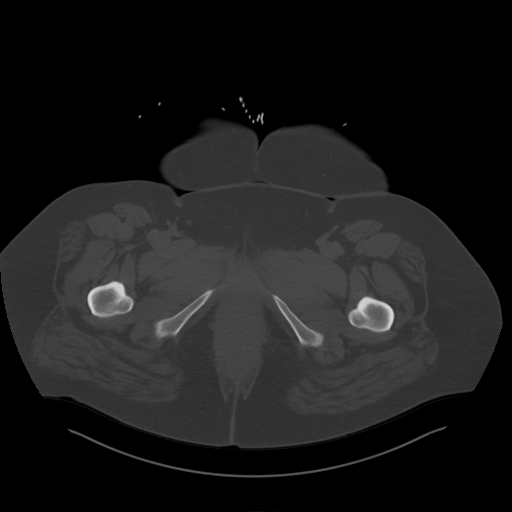
[im 11/99  soft-tissue]
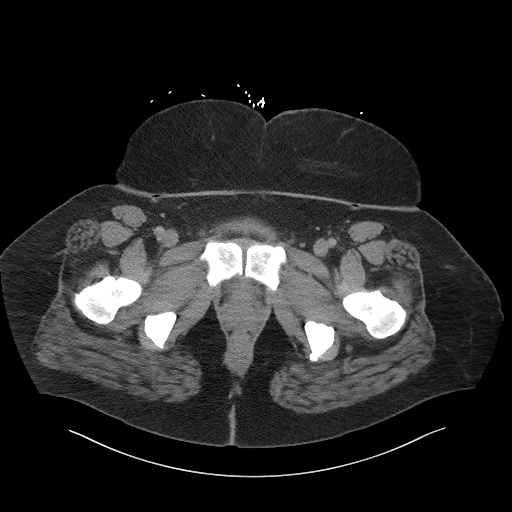
[im 22/99  soft-tissue]
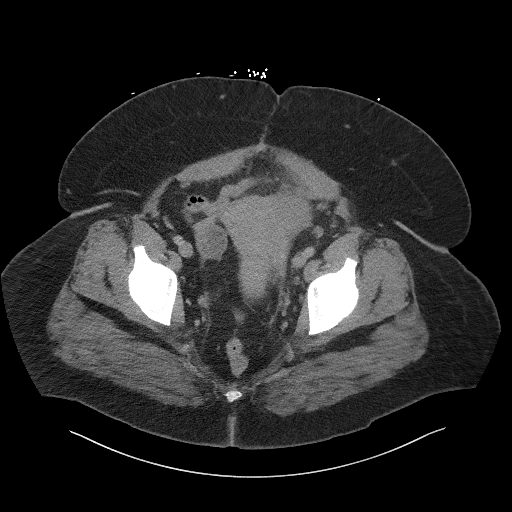
[im 28/99  soft-tissue]
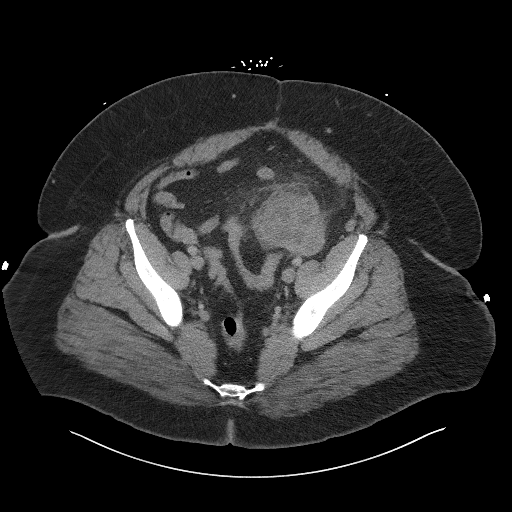
[im 33/99  soft-tissue]
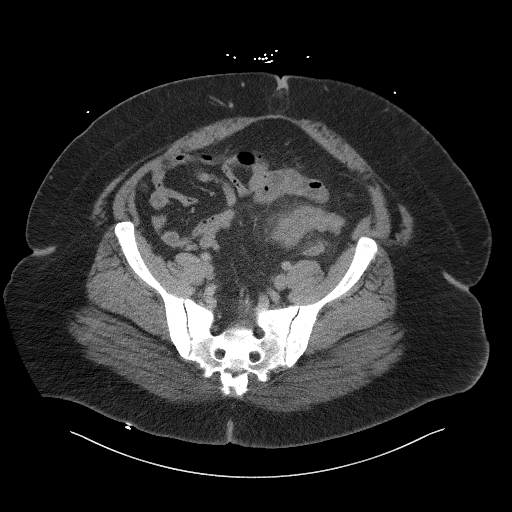
[im 44/99  soft-tissue]
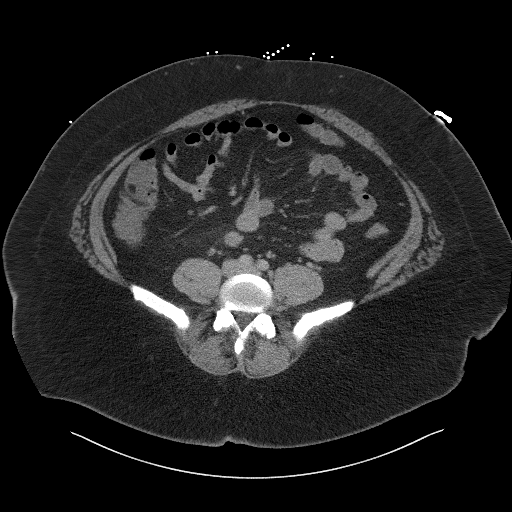
[im 50/99  soft-tissue]
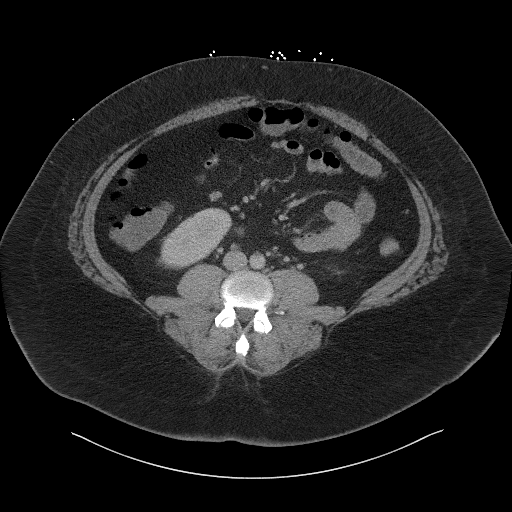
[im 55/99  soft-tissue]
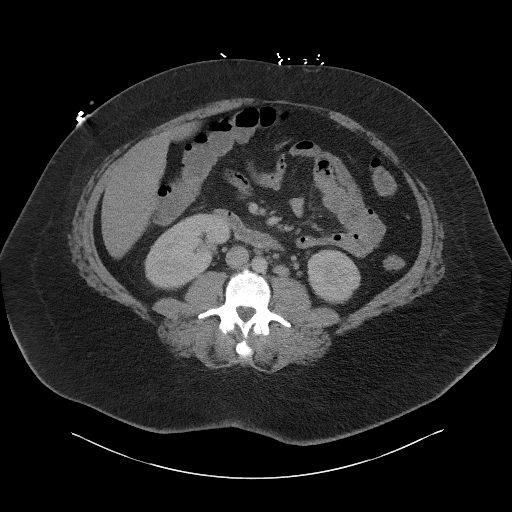
[im 66/99  soft-tissue]
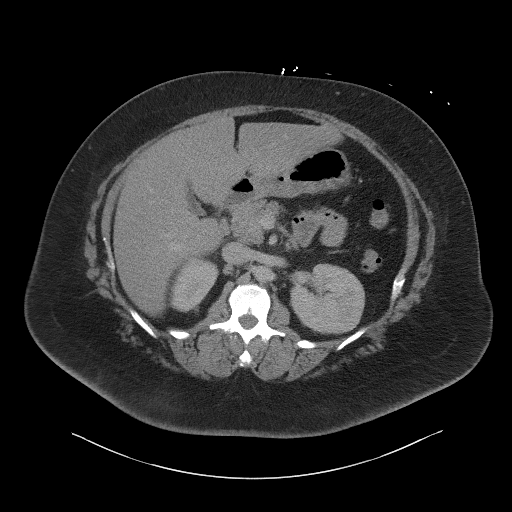
[im 66/99  bone]
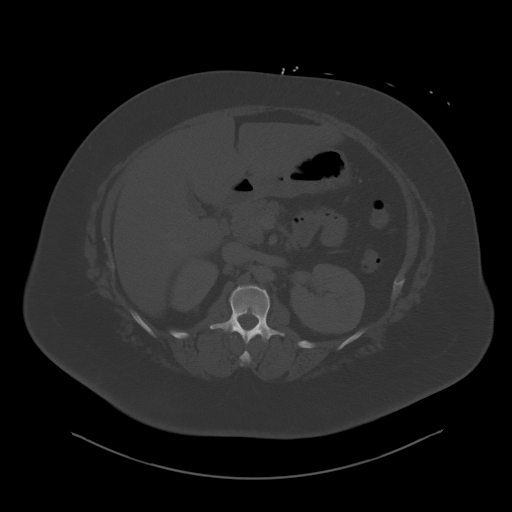
[im 71/99  soft-tissue]
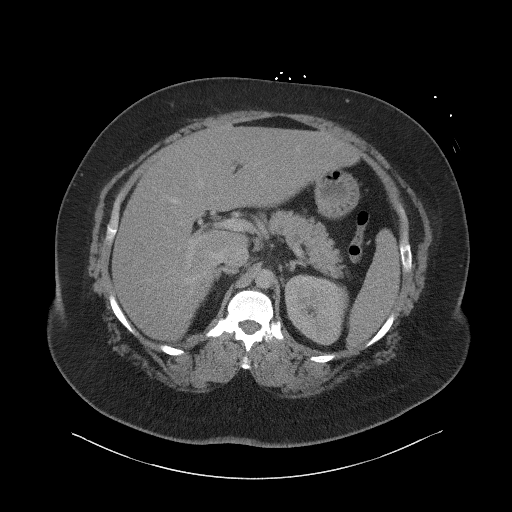
[im 77/99  soft-tissue]
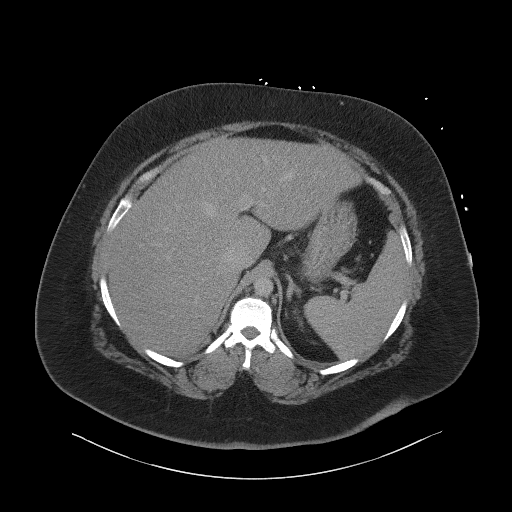
[im 88/99  soft-tissue]
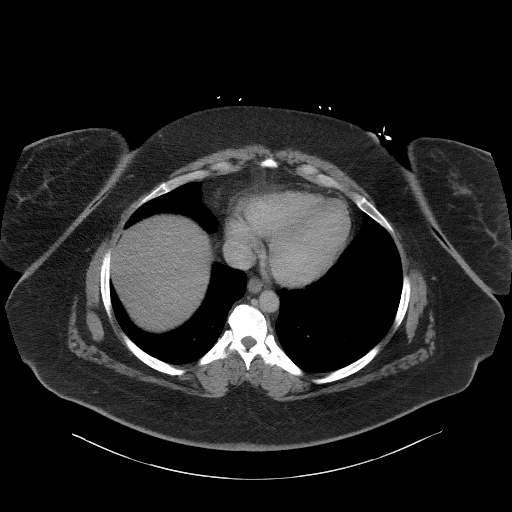
[im 93/99  soft-tissue]
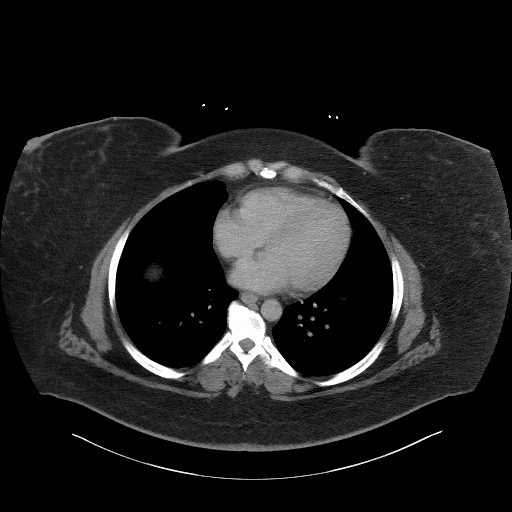

[Series 5: coronal st · coronal · 0.82mm/px · 3 of 121 slices shown]
[im 41/121  soft-tissue]
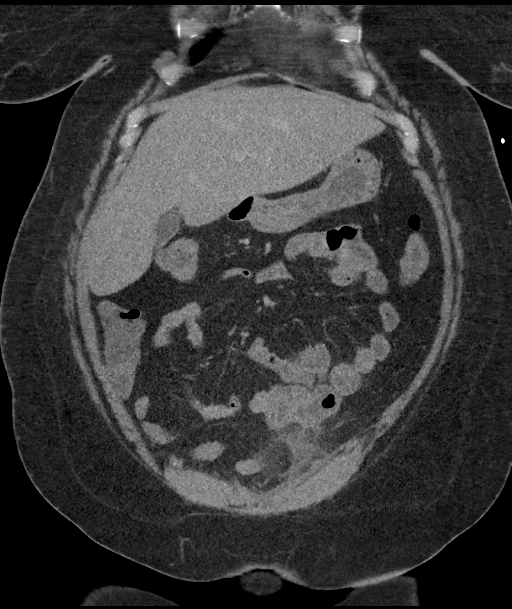
[im 54/121  soft-tissue]
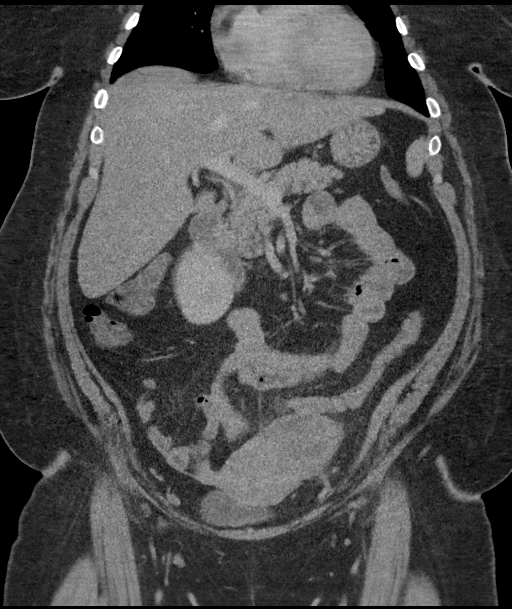
[im 67/121  soft-tissue]
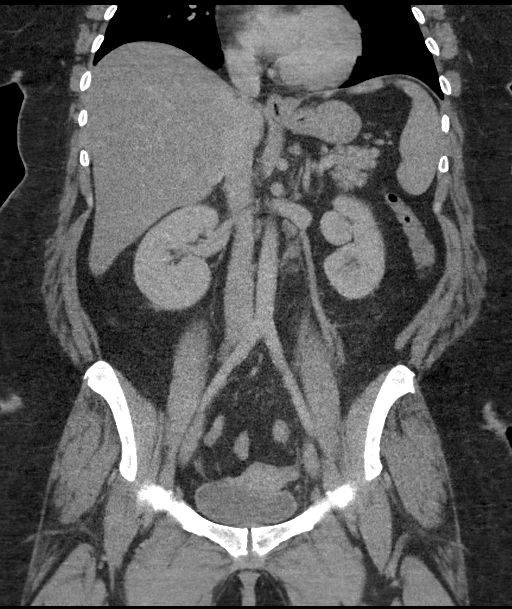

[16 of 46 positions shown; findings below may reference images not displayed]

FINDINGS: Lower Chest: No acute findings.

Hepatobiliary: No hepatic masses identified. Mild diffuse hepatic
steatosis. Gallbladder is unremarkable. No evidence of biliary
ductal dilatation.

Pancreas:  No mass or inflammatory changes.

Spleen: Within normal limits in size and appearance.

Adrenals/Urinary Tract: No masses identified. No evidence of
ureteral calculi or hydronephrosis.

Stomach/Bowel: Multi locular cystic lesion with surrounding
inflammatory changes is seen in the left lower quadrant abutting the
proximal sigmoid colon. There is only mild thickening of the
adjacent proximal sigmoid colon.

Vascular/Lymphatic: No pathologically enlarged lymph nodes. No
abdominal aortic aneurysm.

Reproductive: Increased size of a multilocular cystic lesion in the
left adnexa with surrounding inflammatory changes. This abuts the
proximal sigmoid colon but favors no adnexal origin. This currently
measures 7.0 x 6.4 cm compared with 5.0 x 4.8 cm previously.

Other:  None.

Musculoskeletal:  No suspicious bone lesions identified.
IMPRESSION: Increased size of 7 cm multilocular cystic lesion in the left adnexa
with surrounding inflammatory changes, which abuts the proximal
sigmoid colon. Differential diagnosis includes tubo-ovarian abscess,
cystic ovarian mass with ovarian torsion, with diverticulitis with
diverticular abscess considered less likely. Recommend clinical
correlation; consider pelvic ultrasound with Doppler for further
evaluation.

## 2021-03-30 ENCOUNTER — Inpatient Hospital Stay (HOSPITAL_BASED_OUTPATIENT_CLINIC_OR_DEPARTMENT_OTHER)
Admission: EM | Admit: 2021-03-30 | Discharge: 2021-04-01 | DRG: 758 | Disposition: A | Discharge status: Home / Self Care | Payer: Medicaid Other | Attending: Obstetrics & Gynecology | Admitting: Obstetrics & Gynecology

## 2021-03-30 ENCOUNTER — Other Ambulatory Visit: Payer: Self-pay

## 2021-03-30 ENCOUNTER — Emergency Department (HOSPITAL_BASED_OUTPATIENT_CLINIC_OR_DEPARTMENT_OTHER): Payer: Medicaid Other

## 2021-03-30 ENCOUNTER — Encounter (HOSPITAL_BASED_OUTPATIENT_CLINIC_OR_DEPARTMENT_OTHER): Payer: Self-pay

## 2021-03-30 DIAGNOSIS — N7093 Salpingitis and oophoritis, unspecified: Principal | ICD-10-CM | POA: Diagnosis present

## 2021-03-30 DIAGNOSIS — Z6841 Body Mass Index (BMI) 40.0 and over, adult: Secondary | ICD-10-CM | POA: Diagnosis not present

## 2021-03-30 DIAGNOSIS — F410 Panic disorder [episodic paroxysmal anxiety] without agoraphobia: Secondary | ICD-10-CM | POA: Diagnosis not present

## 2021-03-30 DIAGNOSIS — Z23 Encounter for immunization: Secondary | ICD-10-CM

## 2021-03-30 DIAGNOSIS — Z20822 Contact with and (suspected) exposure to covid-19: Secondary | ICD-10-CM | POA: Diagnosis present

## 2021-03-30 DIAGNOSIS — Z809 Family history of malignant neoplasm, unspecified: Secondary | ICD-10-CM | POA: Diagnosis not present

## 2021-03-30 DIAGNOSIS — E119 Type 2 diabetes mellitus without complications: Secondary | ICD-10-CM | POA: Diagnosis not present

## 2021-03-30 DIAGNOSIS — Z8249 Family history of ischemic heart disease and other diseases of the circulatory system: Secondary | ICD-10-CM

## 2021-03-30 DIAGNOSIS — Z794 Long term (current) use of insulin: Secondary | ICD-10-CM | POA: Diagnosis not present

## 2021-03-30 DIAGNOSIS — Z833 Family history of diabetes mellitus: Secondary | ICD-10-CM | POA: Diagnosis not present

## 2021-03-30 DIAGNOSIS — I10 Essential (primary) hypertension: Secondary | ICD-10-CM | POA: Diagnosis present

## 2021-03-30 DIAGNOSIS — Z7984 Long term (current) use of oral hypoglycemic drugs: Secondary | ICD-10-CM

## 2021-03-30 DIAGNOSIS — D72829 Elevated white blood cell count, unspecified: Secondary | ICD-10-CM | POA: Diagnosis present

## 2021-03-30 DIAGNOSIS — F17210 Nicotine dependence, cigarettes, uncomplicated: Secondary | ICD-10-CM | POA: Diagnosis present

## 2021-03-30 DIAGNOSIS — R197 Diarrhea, unspecified: Secondary | ICD-10-CM

## 2021-03-30 DIAGNOSIS — N838 Other noninflammatory disorders of ovary, fallopian tube and broad ligament: Secondary | ICD-10-CM | POA: Diagnosis present

## 2021-03-30 DIAGNOSIS — Z79899 Other long term (current) drug therapy: Secondary | ICD-10-CM

## 2021-03-30 DIAGNOSIS — R1032 Left lower quadrant pain: Secondary | ICD-10-CM

## 2021-03-30 LAB — PREGNANCY, URINE: Preg Test, Ur: NEGATIVE

## 2021-03-30 LAB — COMPREHENSIVE METABOLIC PANEL
ALT: 12 U/L (ref 0–44)
AST: 12 U/L — ABNORMAL LOW (ref 15–41)
Albumin: 4.2 g/dL (ref 3.5–5.0)
Alkaline Phosphatase: 69 U/L (ref 38–126)
Anion gap: 10 (ref 5–15)
BUN: 11 mg/dL (ref 6–20)
CO2: 26 mmol/L (ref 22–32)
Calcium: 9.4 mg/dL (ref 8.9–10.3)
Chloride: 101 mmol/L (ref 98–111)
Creatinine, Ser: 0.55 mg/dL (ref 0.44–1.00)
GFR, Estimated: >60 mL/min (ref >60)
Glucose, Bld: 133 mg/dL — ABNORMAL HIGH (ref 70–99)
Potassium: 3.8 mmol/L (ref 3.5–5.1)
Sodium: 137 mmol/L (ref 135–145)
Total Bilirubin: 0.4 mg/dL (ref 0.3–1.2)
Total Protein: 8.5 g/dL — ABNORMAL HIGH (ref 6.5–8.1)

## 2021-03-30 LAB — WET PREP, GENITAL
Sperm: NONE SEEN
Trich, Wet Prep: NONE SEEN
Yeast Wet Prep HPF POC: NONE SEEN

## 2021-03-30 LAB — URINALYSIS, ROUTINE W REFLEX MICROSCOPIC
Bilirubin Urine: NEGATIVE
Glucose, UA: NEGATIVE mg/dL
Hgb urine dipstick: NEGATIVE
Ketones, ur: NEGATIVE mg/dL
Nitrite: NEGATIVE
Protein, ur: NEGATIVE mg/dL
Specific Gravity, Urine: 1.025 (ref 1.005–1.030)
pH: 5 (ref 5.0–8.0)

## 2021-03-30 LAB — CBC
HCT: 42.2 % (ref 36.0–46.0)
Hemoglobin: 13.6 g/dL (ref 12.0–15.0)
MCH: 28 pg (ref 26.0–34.0)
MCHC: 32.2 g/dL (ref 30.0–36.0)
MCV: 86.8 fL (ref 80.0–100.0)
Platelets: 244 10*3/uL (ref 150–400)
RBC: 4.86 MIL/uL (ref 3.87–5.11)
RDW: 14.5 % (ref 11.5–15.5)
WBC: 10.7 10*3/uL — ABNORMAL HIGH (ref 4.0–10.5)
nRBC: 0 % (ref 0.0–0.2)

## 2021-03-30 LAB — LIPASE, BLOOD: Lipase: 29 U/L (ref 11–51)

## 2021-03-30 LAB — RESP PANEL BY RT-PCR (FLU A&B, COVID) ARPGX2
Influenza A by PCR: NEGATIVE
Influenza B by PCR: NEGATIVE
SARS Coronavirus 2 by RT PCR: NEGATIVE

## 2021-03-30 LAB — LACTIC ACID, PLASMA
Lactic Acid, Venous: 0.8 mmol/L (ref 0.5–1.9)
Lactic Acid, Venous: 0.8 mmol/L (ref 0.5–1.9)

## 2021-03-30 LAB — URINALYSIS, MICROSCOPIC (REFLEX)

## 2021-03-30 MED ORDER — OXYCODONE-ACETAMINOPHEN 5-325 MG PO TABS
1.0000 | ORAL_TABLET | ORAL | Status: DC | PRN
  Administered 2021-03-31: 1 via ORAL
  Administered 2021-03-31: 2 via ORAL
  Filled 2021-03-30: qty 1
  Filled 2021-03-30: qty 2

## 2021-03-30 MED ORDER — LOSARTAN POTASSIUM-HCTZ 50-12.5 MG PO TABS
1.0000 | ORAL_TABLET | Freq: Every day | ORAL | Status: DC
Start: 2021-03-31 — End: 2021-03-30

## 2021-03-30 MED ORDER — MORPHINE SULFATE (PF) 4 MG/ML IV SOLN
4.0000 mg | Freq: Once | INTRAVENOUS | Status: AC
  Administered 2021-03-30: 4 mg via INTRAVENOUS
  Filled 2021-03-30: qty 1

## 2021-03-30 MED ORDER — LOSARTAN POTASSIUM 50 MG PO TABS
50.0000 mg | ORAL_TABLET | Freq: Every day | ORAL | Status: DC
  Administered 2021-03-31 – 2021-04-01 (×2): 50 mg via ORAL
  Filled 2021-03-30 (×2): qty 1

## 2021-03-30 MED ORDER — DOXYCYCLINE HYCLATE 100 MG PO TABS: 100.0000 mg | ORAL_TABLET | Freq: Two times a day (BID) | ORAL | Status: DC

## 2021-03-30 MED ORDER — SODIUM CHLORIDE 0.9 % IV SOLN
100.0000 mg | Freq: Two times a day (BID) | INTRAVENOUS | Status: AC
  Administered 2021-03-30 – 2021-03-31 (×3): 100 mg via INTRAVENOUS
  Filled 2021-03-30 (×3): qty 100

## 2021-03-30 MED ORDER — HYDROCHLOROTHIAZIDE 12.5 MG PO CAPS
12.5000 mg | ORAL_CAPSULE | Freq: Every day | ORAL | Status: DC
  Administered 2021-03-31 – 2021-04-01 (×2): 12.5 mg via ORAL
  Filled 2021-03-30 (×3): qty 1

## 2021-03-30 MED ORDER — SODIUM CHLORIDE 0.9 % IV SOLN: 100.0000 mg | Freq: Once | INTRAVENOUS | Status: DC

## 2021-03-30 MED ORDER — ONDANSETRON HCL 4 MG/2ML IJ SOLN
4.0000 mg | Freq: Once | INTRAMUSCULAR | Status: AC
  Administered 2021-03-30: 4 mg via INTRAVENOUS
  Filled 2021-03-30: qty 2

## 2021-03-30 MED ORDER — METRONIDAZOLE 500 MG/100ML IV SOLN
500.0000 mg | Freq: Four times a day (QID) | INTRAVENOUS | Status: DC
  Administered 2021-03-30 – 2021-04-01 (×7): 500 mg via INTRAVENOUS
  Filled 2021-03-30 (×7): qty 100

## 2021-03-30 MED ORDER — SODIUM CHLORIDE 0.9 % IV BOLUS
1000.0000 mL | Freq: Once | INTRAVENOUS | Status: AC
  Administered 2021-03-30: 1000 mL via INTRAVENOUS

## 2021-03-30 MED ORDER — HYDROMORPHONE HCL 1 MG/ML IJ SOLN
1.0000 mg | INTRAMUSCULAR | Status: DC | PRN
  Administered 2021-03-30: 1 mg via INTRAVENOUS
  Filled 2021-03-30: qty 1

## 2021-03-30 MED ORDER — SODIUM CHLORIDE 0.9 % IV SOLN
2.0000 g | INTRAVENOUS | Status: DC
  Administered 2021-03-30: 2 g via INTRAVENOUS
  Filled 2021-03-30: qty 20

## 2021-03-30 MED ORDER — HYDROMORPHONE HCL 1 MG/ML IJ SOLN
0.5000 mg | INTRAMUSCULAR | Status: DC | PRN
  Administered 2021-03-31 (×2): 0.5 mg via INTRAVENOUS
  Filled 2021-03-30 (×2): qty 0.5

## 2021-03-30 MED ORDER — IOHEXOL 300 MG/ML  SOLN
100.0000 mL | Freq: Once | INTRAMUSCULAR | Status: AC | PRN
  Administered 2021-03-30: 100 mL via INTRAVENOUS

## 2021-03-30 MED ORDER — ONDANSETRON HCL 4 MG/2ML IJ SOLN: 4.0000 mg | Freq: Four times a day (QID) | INTRAMUSCULAR | Status: DC | PRN

## 2021-03-30 MED ORDER — SODIUM CHLORIDE 0.9 % IV SOLN
2.0000 g | INTRAVENOUS | Status: DC
  Administered 2021-03-31 – 2021-04-01 (×2): 2 g via INTRAVENOUS
  Filled 2021-03-30 (×2): qty 20

## 2021-03-30 MED ORDER — SODIUM CHLORIDE 0.9 % IV SOLN: 2.0000 g | Freq: Two times a day (BID) | INTRAVENOUS | Status: DC

## 2021-03-30 MED ORDER — IBUPROFEN 800 MG PO TABS: 800.0000 mg | ORAL_TABLET | Freq: Three times a day (TID) | ORAL | Status: DC | PRN

## 2021-03-30 MED ORDER — LACTATED RINGERS IV SOLN: INTRAVENOUS | Status: DC

## 2021-03-30 MED ORDER — METFORMIN HCL ER 500 MG PO TB24
1000.0000 mg | ORAL_TABLET | Freq: Two times a day (BID) | ORAL | Status: DC
  Administered 2021-04-01 (×2): 1000 mg via ORAL
  Filled 2021-03-30 (×5): qty 2

## 2021-03-30 MED ORDER — SODIUM CHLORIDE 0.9% FLUSH
3.0000 mL | Freq: Once | INTRAVENOUS | Status: DC
  Filled 2021-03-30: qty 3

## 2021-03-30 MED ORDER — ONDANSETRON HCL 4 MG PO TABS: 4.0000 mg | ORAL_TABLET | Freq: Four times a day (QID) | ORAL | Status: DC | PRN

## 2021-03-30 MED ORDER — MORPHINE SULFATE (PF) 2 MG/ML IV SOLN
2.0000 mg | Freq: Once | INTRAVENOUS | Status: AC
  Administered 2021-03-30: 2 mg via INTRAVENOUS
  Filled 2021-03-30: qty 1

## 2021-03-30 MED ORDER — METRONIDAZOLE 500 MG/100ML IV SOLN
500.0000 mg | Freq: Once | INTRAVENOUS | Status: AC
  Administered 2021-03-30: 500 mg via INTRAVENOUS
  Filled 2021-03-30: qty 100

## 2021-03-30 NOTE — ED Notes (Signed)
Report called to [facility] ED Charge RN. Report given to [facility].

## 2021-03-30 NOTE — ED Triage Notes (Signed)
Pt c/o LLQ pain, L flank pain, and diarrhea x 4 days.  Hx of diverticulitis.  Sts this feels like a flare up.  Pain score 10/10.

## 2021-03-30 NOTE — ED Notes (Signed)
ED Provider at bedside. Will update vital signs afterwards.

## 2021-03-30 NOTE — ED Notes (Signed)
Chaperone for EDP for vaginal exam. Pt tolerated well. 

## 2021-03-30 NOTE — H&P (Addendum)
Robyn [name] is an 40 y.o. female Para 4 with a history of diverticulitis, diverticular abscess (and s/p IR drainage in 01/2019), left tubo-ovarian abscess (s/p IR drainage in 07/2019)  who presented with chief complaint of left lower abdominal pain for five days that worsened recently, not well controlled with over counter pain medication use.  She also reported diarrhea 2 days ago which has resolved since then. She was evaluated at [place] [facility] and found to have a left tuboovarian abscess and was admitted for IV antibiotics and further management.    Pertinent Gynecological History: Menses: regular every month without intermenstrual spotting Bleeding: None currently Contraception: None. Bilateral tubal ligation could not be done at last cesarean section due to scar tissue. DES exposure: unknown Blood transfusions: none Sexually transmitted diseases: HSV Social history: Sexually active with husband of over 20 years.  Previous GYN Procedures:  Tuboovarian abscess drainage in 2021.    Last mammogram: normal Date: 2021  Last pap: normal Date: 2019 OB History: G5, P1314   Menstrual History: Patient's last menstrual period was 03/12/2021.    Past Medical History:  Diagnosis Date   Anxiety    h/o panic attacks   Diabetes mellitus without complication (HCC)    diet controlled    Diverticulosis 02/16/2019   Herpes    Hypertension    Left ovarian cyst 11/29/2011   PCOS (polycystic ovarian syndrome)    Pregnancy induced hypertension    Shortness of breath dyspnea    with exertion and while pregnant   Smoker 11/29/2011    Past Surgical History:  Procedure Laterality Date   CERVICAL CERCLAGE N/A 10/09/2014   Procedure: CERCLAGE CERVICAL;  Surgeon: Osborn Coho, MD;  Location: [facility];  Service: Gynecology;  Laterality: N/A;   CESAREAN SECTION     C/S x 2   CESAREAN SECTION WITH BILATERAL TUBAL LIGATION Bilateral 03/23/2015   Procedure: CESAREAN SECTION WITH BILATERAL TUBAL LIGATION;   Surgeon: Osborn Coho, MD;  Location: [facility];  Service: Obstetrics;  Laterality: Bilateral;  Amt OK'd to move 02/25/2015   FLEXIBLE SIGMOIDOSCOPY N/A 05/08/2019   Procedure: FLEXIBLE SIGMOIDOSCOPY;  Surgeon: Romie Levee, MD;  Location: [facility];  Service: Endoscopy;  Laterality: N/A;   IR RADIOLOGIST EVAL & MGMT  02/20/2019   IR RADIOLOGIST EVAL & MGMT  03/06/2019   IR RADIOLOGIST EVAL & MGMT  08/21/2019   WISDOM TOOTH EXTRACTION      Family History  Problem Relation Age of Onset   Hypertension Mother    Diabetes Father    Stroke Father    Cancer Maternal Grandmother    Cancer Maternal Uncle     Social History:  reports that she has been smoking cigarettes. She has never used smokeless tobacco. She reports current alcohol use. She reports that she does not use drugs.  Allergies: No Known Allergies  Medications Prior to Admission  Medication Sig Dispense Refill Last Dose   losartan-hydrochlorothiazide (HYZAAR) 50-12.5 MG tablet Take 1 tablet by mouth daily.   03/30/2021   metFORMIN (GLUCOPHAGE-XR) 500 MG 24 hr tablet Take 1,000 mg by mouth 2 (two) times daily.   03/29/2021   amoxicillin (AMOXIL) 500 MG capsule Take 1 capsule (500 mg total) by mouth 3 (three) times daily. (Patient not taking: No sig reported) 21 capsule 0 Not Taking   diclofenac Sodium (VOLTAREN) 1 % GEL Apply 4 g topically 4 (four) times daily. (Patient not taking: No sig reported) 150 g 0 Not Taking   fluconazole (DIFLUCAN) 150 MG tablet  Take 1 tablet as needed for vaginal yeast infection.  May repeat in 3 days if symptoms persist. (Patient not taking: No sig reported) 2 tablet 0 Not Taking   HYDROcodone-acetaminophen (NORCO/VICODIN) 5-325 MG tablet Take 1 tablet by mouth every 6 (six) hours as needed. (Patient not taking: No sig reported) 10 tablet 0 Not Taking   lidocaine (LIDODERM) 5 % Place 1 patch onto the skin daily. Remove & Discard patch within 12 hours or as directed by MD (Patient not taking: No sig  reported) 30 patch 0 Not Taking   losartan (COZAAR) 50 MG tablet Take 1 tablet (50 mg total) by mouth daily. (Patient not taking: No sig reported) 30 tablet 0 Not Taking   methocarbamol (ROBAXIN) 500 MG tablet Take 1 tablet (500 mg total) by mouth 2 (two) times daily. (Patient not taking: No sig reported) 20 tablet 0 Not Taking   naproxen (NAPROSYN) 375 MG tablet Take 1 tablet twice daily as needed for pain. (Patient not taking: No sig reported) 20 tablet 0 Not Taking    Review of Systems  Blood pressure 131/74, pulse 77, temperature 98.2 F (36.8 C), temperature source Oral, resp. rate 18, height 5\' 7"  (1.702 m), weight 125.6 kg, last menstrual period 03/12/2021, SpO2 96 %. ROS  Constitutional: Denies fevers/chills Cardiovascular: Denies chest pain or palpitations Pulmonary: Denies coughing or wheezing Gastrointestinal: Denies nausea, vomiting or diarrhea Genitourinary: With pelvic pain, Denies unusual vaginal bleeding, unusual vaginal discharge.  With dysuria.  Denies urgency or frequency.  Musculoskeletal: Denies muscle or joint aches and pain.  Neurology: Denies abnormal sensations such as tingling or numbness.    Physical Exam  Blood pressure 131/74, pulse 77, temperature 98.2 F (36.8 C), temperature source Oral, resp. rate 18, height 5\' 7"  (1.702 m), weight 125.6 kg, BMI 43, last menstrual period 03/12/2021, SpO2 96 % Constitutional: She is oriented to person, place, and time. She appears well-developed and well-nourished.  HENT:  Head: Normocephalic and atraumatic.  Neck: Normal range of motion.  Cardiovascular: Normal rate, regular rhythm and normal heart sounds.   Respiratory: Effort normal and breath sounds normal.  GI: Soft. Bowel sounds are normal.  Abdomen is soft, tender to palpation in left lower quadrant, with guarding,no rebound.  Neurological: She is alert and oriented to person, place, and time.  Skin: Skin is warm and dry.  Psychiatric: She has a normal mood and  affect. Her behavior is normal.    Results for orders placed or performed during the hospital encounter of 03/30/21 (from the past 24 hour(s))  Urinalysis, Routine w reflex microscopic     Status: Abnormal   Collection Time: 03/30/21 10:15 AM  Result Value Ref Range   Color, Urine YELLOW YELLOW   APPearance CLEAR CLEAR   Specific Gravity, Urine 1.025 1.005 - 1.030   pH 5.0 5.0 - 8.0   Glucose, UA NEGATIVE NEGATIVE mg/dL   Hgb urine dipstick NEGATIVE NEGATIVE   Bilirubin Urine NEGATIVE NEGATIVE   Ketones, ur NEGATIVE NEGATIVE mg/dL   Protein, ur NEGATIVE NEGATIVE mg/dL   Nitrite NEGATIVE NEGATIVE   Leukocytes,Ua SMALL (A) NEGATIVE  Pregnancy, urine     Status: None   Collection Time: 03/30/21 10:15 AM  Result Value Ref Range   Preg Test, Ur NEGATIVE NEGATIVE  Urinalysis, Microscopic (reflex)     Status: Abnormal   Collection Time: 03/30/21 10:15 AM  Result Value Ref Range   RBC / HPF 0-5 0 - 5 RBC/hpf   WBC, UA 6-10 0 - 5  WBC/hpf   Bacteria, UA RARE (A) NONE SEEN   Squamous Epithelial / LPF 6-10 0 - 5   Mucus PRESENT   Lipase, blood     Status: None   Collection Time: 03/30/21 10:17 AM  Result Value Ref Range   Lipase 29 11 - 51 U/L  Comprehensive metabolic panel     Status: Abnormal   Collection Time: 03/30/21 10:17 AM  Result Value Ref Range   Sodium 137 135 - 145 mmol/L   Potassium 3.8 3.5 - 5.1 mmol/L   Chloride 101 98 - 111 mmol/L   CO2 26 22 - 32 mmol/L   Glucose, Bld 133 (H) 70 - 99 mg/dL   BUN 11 6 - 20 mg/dL   Creatinine, Ser 2.72 0.44 - 1.00 mg/dL   Calcium 9.4 8.9 - 53.6 mg/dL   Total Protein 8.5 (H) 6.5 - 8.1 g/dL   Albumin 4.2 3.5 - 5.0 g/dL   AST 12 (L) 15 - 41 U/L   ALT 12 0 - 44 U/L   Alkaline Phosphatase 69 38 - 126 U/L   Total Bilirubin 0.4 0.3 - 1.2 mg/dL   GFR, Estimated >64 >40 mL/min   Anion gap 10 5 - 15  CBC     Status: Abnormal   Collection Time: 03/30/21 10:17 AM  Result Value Ref Range   WBC 10.7 (H) 4.0 - 10.5 K/uL   RBC 4.86 3.87 -  5.11 MIL/uL   Hemoglobin 13.6 12.0 - 15.0 g/dL   HCT 34.7 42.5 - 95.6 %   MCV 86.8 80.0 - 100.0 fL   MCH 28.0 26.0 - 34.0 pg   MCHC 32.2 30.0 - 36.0 g/dL   RDW 38.7 56.4 - 33.2 %   Platelets 244 150 - 400 K/uL   nRBC 0.0 0.0 - 0.2 %  Lactic acid, plasma     Status: None   Collection Time: 03/30/21  1:35 PM  Result Value Ref Range   Lactic Acid, Venous 0.8 0.5 - 1.9 mmol/L  Resp Panel by RT-PCR (Flu A&B, Covid) Nasopharyngeal Swab     Status: None   Collection Time: 03/30/21  3:25 PM   Specimen: Nasopharyngeal Swab; Nasopharyngeal(NP) swabs in vial transport medium  Result Value Ref Range   SARS Coronavirus 2 by RT PCR NEGATIVE NEGATIVE   Influenza A by PCR NEGATIVE NEGATIVE   Influenza B by PCR NEGATIVE NEGATIVE  Wet prep, genital     Status: Abnormal   Collection Time: 03/30/21  3:55 PM  Result Value Ref Range   Yeast Wet Prep HPF POC NONE SEEN NONE SEEN   Trich, Wet Prep NONE SEEN NONE SEEN   Clue Cells Wet Prep HPF POC PRESENT (A) NONE SEEN   WBC, Wet Prep HPF POC MANY (A) NONE SEEN   Sperm NONE SEEN   Lactic acid, plasma     Status: None   Collection Time: 03/30/21  5:39 PM  Result Value Ref Range   Lactic Acid, Venous 0.8 0.5 - 1.9 mmol/L    CT ABDOMEN PELVIS W CONTRAST  Result Date: 03/30/2021 CLINICAL DATA:  Abdominal pain, acute (Ped 0-18y) LLQ PAIN EXAM: CT ABDOMEN AND PELVIS WITH CONTRAST TECHNIQUE: Multidetector CT imaging of the abdomen and pelvis was performed using the standard protocol following bolus administration of intravenous contrast. CONTRAST:  OMNIPAQUE IOHEXOL 300 MG/ML  SOLN COMPARISON:  CT abdomen pelvis 08/21/2019, multiple prior CTs and pelvic ultrasounds FINDINGS: Lower chest: No acute abnormality. Hepatobiliary: No focal liver abnormality is seen.  No gallstones, gallbladder wall thickening, or biliary dilatation. Pancreas: Unremarkable. No pancreatic ductal dilatation or surrounding inflammatory changes. Spleen: Normal in size without focal  abnormality. Adrenals/Urinary Tract: Adrenal glands are unremarkable. No hydronephrosis or nephrolithiasis. Bladder is unremarkable. Stomach/Bowel: Stomach is within normal limits. Appendix appears normal. Scattered diverticulosis without diverticulitis. No evidence of bowel wall thickening, distention, or inflammatory changes. Vascular/Lymphatic: No significant vascular findings are present. There are few prominent left periaortic lymph nodes which are likely reactive. Reproductive: There is a heterogeneous low-density lesion in left adnexa with rim enhancement and internal septation. This measures approximately 4.7 x 3.4 x 4.9 cm (series 2, image 73, coronal image 71). There is adjacent inflammatory stranding. There is a 4.3 cm right adnexal cyst which appears simple and without rim enhancement. Other: Tiny fat containing umbilical hernia. No bowel containing hernia. No abdominopelvic ascites. No free air. Musculoskeletal: No acute osseous abnormality. No suspicious lytic or blastic lesions. There is right greater left subchondral sclerosis of the sacroiliac joints consistent with chronic sacroiliitis. IMPRESSION: Recurrent left-sided tubo-ovarian abscess measuring 4.7 x 3.4 x 4.9 cm. 4.3 cm right adnexal cyst which appears simple and without rim enhancement. Electronically Signed   By: Caprice Renshaw M.D.   On: 03/30/2021 14:25   US PELVIC COMPLETE W TRANSVAGINAL AND TORSION R/O  Result Date: 03/30/2021 CLINICAL DATA:  Left lower quadrant pain EXAM: TRANSABDOMINAL AND TRANSVAGINAL ULTRASOUND OF PELVIS DOPPLER ULTRASOUND OF OVARIES TECHNIQUE: Both transabdominal and transvaginal ultrasound examinations of the pelvis were performed. Transabdominal technique was performed for global imaging of the pelvis including uterus, ovaries, adnexal regions, and pelvic cul-de-sac. It was necessary to proceed with endovaginal exam following the transabdominal exam to visualize the uterus/endometrium and ovaries/adnexa. Color  and duplex Doppler ultrasound was utilized to evaluate blood flow to the ovaries. COMPARISON:  Same day CT and multiple prior exams. FINDINGS: Uterus Measurements: 8.2 x 4.4 x 5.7 cm = volume: 107.7 mL. No fibroids or other mass visualized. Endometrium Thickness: 5.7 mm.  No focal abnormality visualized. Right ovary Measurements: 6.4 x 4.4 x 4.8 cm = volume: 70.5 mL. There is a 4.3 x 3.0 x 3.4 cm cyst with heterogeneous internal echoes, which is likely due to reverberation artifact. Left ovary: Not visualized. Pulsed Doppler evaluation: Normal low-resistance arterial and venous waveforms in the right ovary. The left ovary is not visualized. Other findings Trace free fluid in pelvis. IMPRESSION: The left ovary/adnexa is not well visualized sonographically. 4.3 cm right ovarian cyst with internal echos, not definitely a simple cyst, possibly hemorrhagic. Recommend follow-up ultrasound in 6-12 weeks. Electronically Signed   By: Caprice Renshaw M.D.   On: 03/30/2021 16:14    Assessment/Plan: 40 y/o Para 4 with a history of diverticulitis, diverticular abscess (s/p IR drainage), left tubo-ovarian abscess (s/p IR drainage 07/2019) with recurrent left tubo ovarian abscess, - Admit to Med-surgery floor. - IV antibiotics of IV ceftriaxone, IV Doxycycline, IV metronidazole.  - CBC in the morning for WBC follow up. - Follow up GC/chlamydia, urine culture results.  -Consider IR drainage of tuboovarian abscess if without clinical improvement.   Prescilla Sours, MD 03/30/2021, 9:34 PM

## 2021-03-30 NOTE — ED Notes (Signed)
Report called to [facility] RN, [facility] 6N

## 2021-03-30 NOTE — ED Provider Notes (Signed)
[facility] EMERGENCY DEPARTMENT Provider Note   CSN: [id number] Arrival date & time: 03/30/21  8676     History Chief Complaint  Patient presents with   Abdominal Pain   Diarrhea    Robyn [name] is a 40 y.o. female.  The history is provided by the patient and medical records. No language interpreter was used.  Abdominal Pain Pain location:  LLQ Pain quality: aching and sharp   Pain radiates to:  Does not radiate Pain severity:  Severe Onset quality:  Gradual Duration:  4 days Timing:  Constant Progression:  Worsening Chronicity:  Recurrent Relieved by:  Nothing Worsened by:  Palpation Ineffective treatments:  None tried Associated symptoms: diarrhea, dysuria, fatigue and nausea   Associated symptoms: no chest pain, no chills, no constipation, no cough, no fever, no shortness of breath, no vaginal bleeding, no vaginal discharge and no vomiting   Diarrhea Associated symptoms: abdominal pain   Associated symptoms: no chills, no diaphoresis, no fever, no headaches and no vomiting       Past Medical History:  Diagnosis Date   Anxiety    h/o panic attacks   Diabetes mellitus without complication (HCC)    diet controlled    Diverticulosis 02/16/2019   Herpes    Hypertension    Left ovarian cyst 11/29/2011   PCOS (polycystic ovarian syndrome)    Pregnancy induced hypertension    Shortness of breath dyspnea    with exertion and while pregnant   Smoker 11/29/2011    Patient Active Problem List   Diagnosis Date Noted   Tubo-ovarian abscess 08/06/2019   TOA (tubo-ovarian abscess) 08/05/2019   Colonic diverticular abscess 02/10/2019   Diverticulitis 02/09/2019   S/P repeat low transverse C-section 03/23/2015   Hypertension - on Labetalol 200 mg po bid 11/22/2014   Diabetes mellitus type 2, uncontrolled, without complications -- dx'd in 2011 11/22/2014   Hx of incompetent cervix, currently pregnant - cerclage placed on 10/09/14 by Dr. Su Hilt 11/22/2014    History of preterm delivery x 2 - weekly 17P injections - first injection at 16.5 wks 11/22/2014   Elderly multigravida 11/22/2014   PCOS (polycystic ovarian syndrome) 08/21/2014   Herpes 08/21/2014   ANA positive 08/21/2014   Previous cesarean delivery, antepartum condition or complication x 2--previous vertical incision 08/21/2014   Obesity 05/20/2012    Past Surgical History:  Procedure Laterality Date   CERVICAL CERCLAGE N/A 10/09/2014   Procedure: CERCLAGE CERVICAL;  Surgeon: Osborn Coho, MD;  Location: [facility];  Service: Gynecology;  Laterality: N/A;   CESAREAN SECTION     C/S x 2   CESAREAN SECTION WITH BILATERAL TUBAL LIGATION Bilateral 03/23/2015   Procedure: CESAREAN SECTION WITH BILATERAL TUBAL LIGATION;  Surgeon: Osborn Coho, MD;  Location: [facility];  Service: Obstetrics;  Laterality: Bilateral;  Amt OK'd to move 02/25/2015   FLEXIBLE SIGMOIDOSCOPY N/A 05/08/2019   Procedure: FLEXIBLE SIGMOIDOSCOPY;  Surgeon: Romie Levee, MD;  Location: [facility];  Service: Endoscopy;  Laterality: N/A;   IR RADIOLOGIST EVAL & MGMT  02/20/2019   IR RADIOLOGIST EVAL & MGMT  03/06/2019   IR RADIOLOGIST EVAL & MGMT  08/21/2019   WISDOM TOOTH EXTRACTION       OB History     Gravida  5   Para  3   Term      Preterm  3   AB  2   Living  3      SAB  1   IAB  1  Ectopic      Multiple  0   Live Births  3           Family History  Problem Relation Age of Onset   Hypertension Mother    Diabetes Father    Stroke Father    Cancer Maternal Grandmother    Cancer Maternal Uncle     Social History   Tobacco Use   Smoking status: Every Day    Years: 13.00    Types: Cigarettes   Smokeless tobacco: Never  Vaping Use   Vaping Use: Never used  Substance Use Topics   Alcohol use: Yes    Comment: socially and rarely   Drug use: No    Home Medications Prior to Admission medications   Medication Sig Start Date End Date Taking? Authorizing Provider  amoxicillin  (AMOXIL) 500 MG capsule Take 1 capsule (500 mg total) by mouth 3 (three) times daily. 01/14/21   Molpus, John, MD  diclofenac Sodium (VOLTAREN) 1 % GEL Apply 4 g topically 4 (four) times daily. 11/10/20   Malvin Johns, MD  fluconazole (DIFLUCAN) 150 MG tablet Take 1 tablet as needed for vaginal yeast infection.  May repeat in 3 days if symptoms persist. 01/14/21   Molpus, Jenny Reichmann, MD  hydrochlorothiazide (HYDRODIURIL) 25 MG tablet Take 25 mg by mouth daily. 01/24/19   [provider]  HYDROcodone-acetaminophen (NORCO/VICODIN) 5-325 MG tablet Take 1 tablet by mouth every 6 (six) hours as needed. 08/31/20   Henderly, Britni A, PA-C  lidocaine (LIDODERM) 5 % Place 1 patch onto the skin daily. Remove & Discard patch within 12 hours or as directed by MD 02/14/20   Randal Buba, April, MD  losartan (COZAAR) 50 MG tablet Take 1 tablet (50 mg total) by mouth daily. 11/10/20   Malvin Johns, MD  metFORMIN (GLUCOPHAGE-XR) 500 MG 24 hr tablet Take 1,000 mg by mouth 2 (two) times daily. 12/23/18   [provider]  methocarbamol (ROBAXIN) 500 MG tablet Take 1 tablet (500 mg total) by mouth 2 (two) times daily. 01/09/20   Volanda Napoleon, PA-C  naproxen (NAPROSYN) 375 MG tablet Take 1 tablet twice daily as needed for pain. 01/14/21   Molpus, John, MD  NOVOLIN 70/30 RELION (70-30) 100 UNIT/ML injection Inject 15 Units into the skin 2 (two) times daily between meals as needed (if blood sugar is high).  07/17/19   [provider]    Allergies    Patient has no known allergies.  Review of Systems   Review of Systems  Constitutional:  Positive for fatigue. Negative for chills, diaphoresis and fever.  HENT:  Negative for congestion.   Respiratory:  Negative for cough, chest tightness, shortness of breath and wheezing.   Cardiovascular:  Negative for chest pain and palpitations.  Gastrointestinal:  Positive for abdominal pain, diarrhea and nausea. Negative for abdominal distention, constipation and  vomiting.  Genitourinary:  Positive for dysuria and flank pain. Negative for decreased urine volume, frequency, pelvic pain, vaginal bleeding, vaginal discharge and vaginal pain.  Musculoskeletal:  Negative for back pain, neck pain and neck stiffness.  Skin:  Negative for rash and wound.  Neurological:  Negative for light-headedness and headaches.  Psychiatric/Behavioral:  Negative for agitation and confusion.   All other systems reviewed and are negative.  Physical Exam Updated Vital Signs BP 139/90 (BP Location: Left Arm)   Pulse 83   Temp 99.3 F (37.4 C) (Oral)   Resp 18   Ht 5\' 7"  (1.702 m)   Wt  125.6 kg   LMP 03/12/2021   SpO2 100%   BMI 43.38 kg/m   Physical Exam Vitals and nursing note reviewed.  Constitutional:      General: She is not in acute distress.    Appearance: She is well-developed. She is not ill-appearing, toxic-appearing or diaphoretic.  HENT:     Head: Normocephalic and atraumatic.  Eyes:     Conjunctiva/sclera: Conjunctivae normal.  Cardiovascular:     Rate and Rhythm: Normal rate and regular rhythm.     Heart sounds: No murmur heard. Pulmonary:     Effort: Pulmonary effort is normal. No respiratory distress.     Breath sounds: Normal breath sounds.  Abdominal:     General: Abdomen is flat. Bowel sounds are normal. There is no distension.     Palpations: Abdomen is soft.     Tenderness: There is abdominal tenderness in the left lower quadrant. There is no right CVA tenderness, left CVA tenderness, guarding or rebound.  Musculoskeletal:     Cervical back: Neck supple.  Skin:    General: Skin is warm and dry.     Capillary Refill: Capillary refill takes less than 2 seconds.  Neurological:     General: No focal deficit present.     Mental Status: She is alert.  Psychiatric:        Mood and Affect: Mood normal. Mood is not anxious.    ED Results / Procedures / Treatments   Labs (all labs ordered are listed, but only abnormal results are  displayed) Labs Reviewed  COMPREHENSIVE METABOLIC PANEL - Abnormal; Notable for the following components:      Result Value   Glucose, Bld 133 (*)    Total Protein 8.5 (*)    AST 12 (*)    All other components within normal limits  CBC - Abnormal; Notable for the following components:   WBC 10.7 (*)    All other components within normal limits  URINALYSIS, ROUTINE W REFLEX MICROSCOPIC - Abnormal; Notable for the following components:   Leukocytes,Ua SMALL (*)    All other components within normal limits  URINALYSIS, MICROSCOPIC (REFLEX) - Abnormal; Notable for the following components:   Bacteria, UA RARE (*)    All other components within normal limits  URINE CULTURE  WET PREP, GENITAL  RESP PANEL BY RT-PCR (FLU A&B, COVID) ARPGX2  LIPASE, BLOOD  PREGNANCY, URINE  LACTIC ACID, PLASMA  LACTIC ACID, PLASMA  GC/CHLAMYDIA PROBE AMP ([place]) NOT AT [facility]    EKG None  Radiology CT ABDOMEN PELVIS W CONTRAST  Result Date: 03/30/2021 CLINICAL DATA:  Abdominal pain, acute (Ped 0-18y) LLQ PAIN EXAM: CT ABDOMEN AND PELVIS WITH CONTRAST TECHNIQUE: Multidetector CT imaging of the abdomen and pelvis was performed using the standard protocol following bolus administration of intravenous contrast. CONTRAST:  172mL OMNIPAQUE IOHEXOL 300 MG/ML  SOLN COMPARISON:  CT abdomen pelvis 08/21/2019, multiple prior CTs and pelvic ultrasounds FINDINGS: Lower chest: No acute abnormality. Hepatobiliary: No focal liver abnormality is seen. No gallstones, gallbladder wall thickening, or biliary dilatation. Pancreas: Unremarkable. No pancreatic ductal dilatation or surrounding inflammatory changes. Spleen: Normal in size without focal abnormality. Adrenals/Urinary Tract: Adrenal glands are unremarkable. No hydronephrosis or nephrolithiasis. Bladder is unremarkable. Stomach/Bowel: Stomach is within normal limits. Appendix appears normal. Scattered diverticulosis without diverticulitis. No evidence of bowel wall  thickening, distention, or inflammatory changes. Vascular/Lymphatic: No significant vascular findings are present. There are few prominent left periaortic lymph nodes which are likely reactive. Reproductive: There is a  heterogeneous low-density lesion in left adnexa with rim enhancement and internal septation. This measures approximately 4.7 x 3.4 x 4.9 cm (series 2, image 73, coronal image 71). There is adjacent inflammatory stranding. There is a 4.3 cm right adnexal cyst which appears simple and without rim enhancement. Other: Tiny fat containing umbilical hernia. No bowel containing hernia. No abdominopelvic ascites. No free air. Musculoskeletal: No acute osseous abnormality. No suspicious lytic or blastic lesions. There is right greater left subchondral sclerosis of the sacroiliac joints consistent with chronic sacroiliitis. IMPRESSION: Recurrent left-sided tubo-ovarian abscess measuring 4.7 x 3.4 x 4.9 cm. 4.3 cm right adnexal cyst which appears simple and without rim enhancement. Electronically Signed   By: Maurine Simmering M.D.   On: 03/30/2021 14:25    Procedures Procedures   CRITICAL CARE Performed by: Gwenyth Allegra Daaiel Starlin Total critical care time: 35 minutes Critical care time was exclusive of separately billable procedures and treating other patients. Critical care was necessary to treat or prevent imminent or life-threatening deterioration. Critical care was time spent personally by me on the following activities: development of treatment plan with patient and/or surrogate as well as nursing, discussions with consultants, evaluation of patient's response to treatment, examination of patient, obtaining history from patient or surrogate, ordering and performing treatments and interventions, ordering and review of laboratory studies, ordering and review of radiographic studies, pulse oximetry and re-evaluation of patient's condition.   Medications Ordered in ED Medications  sodium chloride flush  (NS) 0.9 % injection 3 mL (3 mLs Intravenous Not Given 03/30/21 1538)  metroNIDAZOLE (FLAGYL) IVPB 500 mg (has no administration in time range)  doxycycline (VIBRAMYCIN) 100 mg in sodium chloride 0.9 % 250 mL IVPB (has no administration in time range)  cefTRIAXone (ROCEPHIN) 2 g in sodium chloride 0.9 % 100 mL IVPB (2 g Intravenous New Bag/Given 03/30/21 1542)  iohexol (OMNIPAQUE) 300 MG/ML solution 100 mL (100 mLs Intravenous Contrast Given 03/30/21 1315)  morphine 4 MG/ML injection 4 mg (4 mg Intravenous Given 03/30/21 1412)  ondansetron (ZOFRAN) injection 4 mg (4 mg Intravenous Given 03/30/21 1409)  sodium chloride 0.9 % bolus 1,000 mL (0 mLs Intravenous Stopped 03/30/21 1542)    ED Course  I have reviewed the triage vital signs and the nursing notes.  Pertinent labs & imaging results that were available during my care of the patient were reviewed by me and considered in my medical decision making (see chart for details).    MDM Rules/Calculators/A&P                           Yasuko Pozo is a 40 y.o. female with a past medical history significant for hypertension, diabetes, PCOS, and recurrent diverticulitis with previous abscess who presents with left lower quadrant abdominal pain, diarrhea, nausea, and some mild dysuria.  Patient reports that this feels similar to when she had diverticulitis in the past.  She reports she normally gets admitted for because they are usually diverticular abscesses involved.  She reports that it is not as bad last time so she wanted come in for escalated.  She report symptoms of gone for the last few days.  She reports no vomiting but has had some mild nausea.  She denies any cough, congestion, shortness breath, chest pain.  She reports some "tingling" when she urinates but otherwise no vaginal discharge or vaginal bleeding.  She reports the pain does go to her left flank slightly.  No hematuria reported.  No recent  trauma.  No rash to suggest shingles.  No other  complaints.  She reports the pain is 9 out of 10 in severity.   On exam, lungs are clear and chest is nontender.  Abdomen is very tender in the left lower quadrant.  CVA areas nontender.  Normal bowel sounds.  Legs nontender nonedematous.  Otherwise patient is well-appearing.  Oral temperature was 99.3.  Given her history of previous intra-abdominal abscesses related to diverticulitis, the location of discomfort, and similar to prior, I am concerned about recurrent diverticulitis.  She had some screening labs in triage which did show mild leukocytosis and otherwise was similar to prior.  We will add on a lactic acid given the previous admissions for and get urinalysis given the mild urinary symptoms.  Will get CT scan to rule out diverticulitis or abscess.  Given her lack of any pelvic concerns, low suspicion for pelvic etiology of her discomfort at this time.  Will give pain medicine, nausea medicine, and fluids given the diarrhea and her feeling dehydrated.  Anticipate reassessment for work-up to determine disposition.  3:22 PM Patient CT unfortunately reveals evidence of a recurrent left-sided TOA.  I spoke to the [facility] OB[facility]N team and spoke to Dr. Alesia Richards who recommends US obtaining a pelvic exam with swabs and a pelvic ultrasound.  They will admit the patient to [facility] and would like [place] to do an ED to ED transfer so they can see her this afternoon and likely speak with IR for further management as well.  They requested we also give antibiotics.  I initially ordered the antibiotic she received last time but we do not have cefotetan at this facility and pharmacy corrected the antibiotics.  Patient is amenable this plan.  3:37 PM Pelvic exam was performed and the ultrasound was obtained.  It has not yet been read by the patient did have some vaginal discharge on exam.  Patient will be admitted to OB/GYN service at [facility] but will be transferred ED to ED so they can be seen more  expediently with this abscess.  Patient accepted by Dr. Sherwood Gambler for ED to ED transfer.  When she arrives, the OB/GYN with [facility] [place], Dr. Alesia Richards will need to be called to see the patient and from there can speak with IR.  Final Clinical Impression(s) / ED Diagnoses Final diagnoses:  Left lower quadrant abdominal pain  Diarrhea, unspecified type  TOA (tubo-ovarian abscess)      Clinical Impression: 1. TOA (tubo-ovarian abscess)   2. Left lower quadrant abdominal pain   3. Diarrhea, unspecified type   4. Tubo-ovarian abscess     Disposition: Admit  This note was prepared with assistance of Dragon voice recognition software. Occasional wrong-word or sound-a-like substitutions may have occurred due to the inherent limitations of voice recognition software.     Shalinda Burkholder, Gwenyth Allegra, MD 03/30/21 [phone]

## 2021-03-31 DIAGNOSIS — Z20822 Contact with and (suspected) exposure to covid-19: Secondary | ICD-10-CM | POA: Diagnosis present

## 2021-03-31 DIAGNOSIS — F17210 Nicotine dependence, cigarettes, uncomplicated: Secondary | ICD-10-CM | POA: Diagnosis present

## 2021-03-31 DIAGNOSIS — Z79899 Other long term (current) drug therapy: Secondary | ICD-10-CM | POA: Diagnosis not present

## 2021-03-31 DIAGNOSIS — I10 Essential (primary) hypertension: Secondary | ICD-10-CM | POA: Diagnosis present

## 2021-03-31 DIAGNOSIS — Z794 Long term (current) use of insulin: Secondary | ICD-10-CM | POA: Diagnosis not present

## 2021-03-31 DIAGNOSIS — N7093 Salpingitis and oophoritis, unspecified: Secondary | ICD-10-CM | POA: Diagnosis not present

## 2021-03-31 DIAGNOSIS — N838 Other noninflammatory disorders of ovary, fallopian tube and broad ligament: Secondary | ICD-10-CM | POA: Diagnosis present

## 2021-03-31 DIAGNOSIS — E119 Type 2 diabetes mellitus without complications: Secondary | ICD-10-CM | POA: Diagnosis present

## 2021-03-31 DIAGNOSIS — Z833 Family history of diabetes mellitus: Secondary | ICD-10-CM | POA: Diagnosis not present

## 2021-03-31 DIAGNOSIS — Z8249 Family history of ischemic heart disease and other diseases of the circulatory system: Secondary | ICD-10-CM | POA: Diagnosis not present

## 2021-03-31 DIAGNOSIS — Z6841 Body Mass Index (BMI) 40.0 and over, adult: Secondary | ICD-10-CM | POA: Diagnosis not present

## 2021-03-31 DIAGNOSIS — Z7984 Long term (current) use of oral hypoglycemic drugs: Secondary | ICD-10-CM | POA: Diagnosis not present

## 2021-03-31 DIAGNOSIS — F410 Panic disorder [episodic paroxysmal anxiety] without agoraphobia: Secondary | ICD-10-CM | POA: Diagnosis present

## 2021-03-31 DIAGNOSIS — Z23 Encounter for immunization: Secondary | ICD-10-CM | POA: Diagnosis not present

## 2021-03-31 DIAGNOSIS — Z809 Family history of malignant neoplasm, unspecified: Secondary | ICD-10-CM | POA: Diagnosis not present

## 2021-03-31 DIAGNOSIS — D72829 Elevated white blood cell count, unspecified: Secondary | ICD-10-CM | POA: Diagnosis present

## 2021-03-31 LAB — CBC WITH DIFFERENTIAL/PLATELET
Abs Immature Granulocytes: 0.03 10*3/uL (ref 0.00–0.07)
Basophils Absolute: 0.1 10*3/uL (ref 0.0–0.1)
Basophils Relative: 1 %
Eosinophils Absolute: 0.1 10*3/uL (ref 0.0–0.5)
Eosinophils Relative: 1 %
HCT: 37.5 % (ref 36.0–46.0)
Hemoglobin: 12.1 g/dL (ref 12.0–15.0)
Immature Granulocytes: 0 %
Lymphocytes Relative: 24 %
Lymphs Abs: 2.4 10*3/uL (ref 0.7–4.0)
MCH: 28.3 pg (ref 26.0–34.0)
MCHC: 32.3 g/dL (ref 30.0–36.0)
MCV: 87.8 fL (ref 80.0–100.0)
Monocytes Absolute: 0.9 10*3/uL (ref 0.1–1.0)
Monocytes Relative: 9 %
Neutro Abs: 6.4 10*3/uL (ref 1.7–7.7)
Neutrophils Relative %: 65 %
Platelets: 227 10*3/uL (ref 150–400)
RBC: 4.27 MIL/uL (ref 3.87–5.11)
RDW: 14.5 % (ref 11.5–15.5)
WBC: 9.8 10*3/uL (ref 4.0–10.5)
nRBC: 0 % (ref 0.0–0.2)

## 2021-03-31 LAB — URINE CULTURE: Culture: <10000 — AB

## 2021-03-31 LAB — GLUCOSE, CAPILLARY: Glucose-Capillary: 159 mg/dL — ABNORMAL HIGH (ref 70–99)

## 2021-03-31 MED ORDER — HYDROMORPHONE HCL 1 MG/ML IJ SOLN
0.5000 mg | INTRAMUSCULAR | Status: DC | PRN
  Administered 2021-03-31 – 2021-04-01 (×3): 0.5 mg via INTRAVENOUS
  Filled 2021-03-31 (×3): qty 0.5

## 2021-03-31 MED ORDER — IBUPROFEN 800 MG PO TABS
800.0000 mg | ORAL_TABLET | Freq: Three times a day (TID) | ORAL | Status: DC
  Administered 2021-03-31 – 2021-04-01 (×2): 800 mg via ORAL
  Filled 2021-03-31 (×2): qty 1

## 2021-03-31 NOTE — Progress Notes (Signed)
Late Entry Note - patient seen and examined at bedside at 11:00 am  Subjective: Patient reports abdominal pain. She notes that it is no worse, nor better from yesterday.  She states that the IV dilaudid did help.  She has not had a bowel movement in two days.  She denies fever or chills.   Objective: I have reviewed patient's vital signs, medications, labs, microbiology, and radiology results.  General: alert, cooperative, and mild distress GI: normal findings: soft, +tender to palpation left>>right with rebound, no guarding. Abdomen non-distended, no acute peritonitis signs.  Extremities: Homans sign is negative, no sign of DVT     Assessment/Plan: 40 year old with Right Recurrent TOA Counseled patient and spouse for more than 30 minutes about care plan Discussed that it would be best to get 48 hours of IV antibiotic therapy, therefore she would stay in hospital until tomorrow and we would transition her to oral antibiotic therapy for 14 days.  Discussed that we would like to avoid anything invasive right now with an acute infectious flare, however she was briefly counseled that most likely she will need a right salpingoopherectomy.  Will adjust frequency of her pain medication to improve pain control Discussed importance of strict glucose control as it will help her immunity to fight off infection.    LOS: 1 day    [facility] 03/31/2021, 5:09 PM

## 2021-03-31 NOTE — Progress Notes (Signed)
Mobility Specialist Progress Note:   03/31/21 1120  Mobility  Activity Ambulated in hall  Level of Assistance Independent  Assistive Device None  Distance Ambulated (ft) 110 ft  Mobility Ambulated independently in hallway  Mobility Response Tolerated well  Mobility performed by Mobility specialist  $Mobility charge 1 Mobility   Pt c/o minor pain during ambulation. Left sitting EOB, with spouse present.   Addison Lank Mobility Specialist  Phone [phone]

## 2021-03-31 NOTE — TOC Initial Note (Signed)
Transition of Care [facility]) - Initial/Assessment Note    Patient Details  Name: Robyn [name] MRN: [id number] Date of Birth: [birth year]  [facility] CM/SW Contact:    Kingsley Plan, RN Phone Number: 03/31/2021, 12:03 PM  Clinical Narrative:                 Patient from home.   Her PCP is at [facility] , [address], [place], [place] [address] phone [phone], however, PCP moved to [place] and patient has been assigned to another provider at same practice but not sure of provider's name. Patient consented for NCM to call and schedule an appointment.  NCM called [facility], patient has been assigned to Charna Archer NP, appointment scheduled for April 07, 2021 at 1100 am. Information placed on AVS.    Patient does not have insurance. Patient states she is looking at enrolling in insurance plan.    At discharge NCM will assist with discharge prescriptions, through [facility]. Co pays around $3 per prescription , patient states she can afford co pays.   Expected Discharge Plan: Home/Self Care Barriers to Discharge: Continued Medical Work up   Patient Goals and CMS Choice Patient states their goals for this hospitalization and ongoing recovery are:: to return to home CMS Medicare.gov Compare Post Acute Care list provided to:: Patient    Expected Discharge Plan and Services Expected Discharge Plan: Home/Self Care In-house Referral: Financial Counselor     Living arrangements for the past 2 months: Single Family Home                 DME Arranged: N/A         HH Arranged: NA          Prior Living Arrangements/Services Living arrangements for the past 2 months: Single Family Home Lives with:: Self Patient language and need for interpreter reviewed:: Yes Do you feel safe going back to the place where you live?: Yes      Need for Family Participation in Patient Care: Yes (Comment) Care giver  support system in place?: Yes (comment)   Criminal Activity/Legal Involvement Pertinent to Current Situation/Hospitalization: No - Comment as needed  Activities of Daily Living Home Assistive Devices/Equipment: None ADL Screening (condition at time of admission) Patient's cognitive ability adequate to safely complete daily activities?: Yes Is the patient deaf or have difficulty hearing?: No Does the patient have difficulty seeing, even when wearing glasses/contacts?: No Does the patient have difficulty concentrating, remembering, or making decisions?: No Patient able to express need for assistance with ADLs?: Yes Does the patient have difficulty dressing or bathing?: No Independently performs ADLs?: Yes (appropriate for developmental age) Does the patient have difficulty walking or climbing stairs?: No Weakness of Legs: None Weakness of Arms/Hands: None  Permission Sought/Granted   Permission granted to share information with : No              Emotional Assessment Appearance:: Appears stated age Attitude/Demeanor/Rapport: Engaged Affect (typically observed): Accepting Orientation: : Oriented to Self, Oriented to Place, Oriented to  Time, Oriented to Situation Alcohol / Substance Use: Not Applicable Psych Involvement: No (comment)  Admission diagnosis:  Tubo-ovarian abscess [N70.93] TOA (tubo-ovarian abscess) [N70.93] Diarrhea, unspecified type [R19.7] Left lower quadrant abdominal pain [R10.32] Patient Active Problem List   Diagnosis Date Noted   Tubo-ovarian abscess 08/06/2019   TOA (tubo-ovarian abscess) 08/05/2019   Colonic diverticular abscess 02/10/2019   Diverticulitis 02/09/2019  S/P repeat low transverse C-section 03/23/2015   Hypertension - on Labetalol 200 mg po bid 11/22/2014   Diabetes mellitus type 2, uncontrolled, without complications -- dx'd in 2011 11/22/2014   Hx of incompetent cervix, currently pregnant - cerclage placed on 10/09/14 by Dr. Su Hilt  11/22/2014   History of preterm delivery x 2 - weekly 17P injections - first injection at 16.5 wks 11/22/2014   Elderly multigravida 11/22/2014   PCOS (polycystic ovarian syndrome) 08/21/2014   Herpes 08/21/2014   ANA positive 08/21/2014   Previous cesarean delivery, antepartum condition or complication x 2--previous vertical incision 08/21/2014   Obesity 05/20/2012   PCP:  Oneita Hurt, No Pharmacy:   [facility] [address] [place] - [address] [place] [address] Phone: [phone] Fax: [phone]  [facility] [address] [place] [address] Phone: [phone] Fax: [phone]     Social Determinants of Health (SDOH) Interventions    Readmission Risk Interventions No flowsheet data found.

## 2021-04-01 ENCOUNTER — Other Ambulatory Visit (HOSPITAL_COMMUNITY): Payer: Self-pay

## 2021-04-01 DIAGNOSIS — N7093 Salpingitis and oophoritis, unspecified: Principal | ICD-10-CM

## 2021-04-01 LAB — GC/CHLAMYDIA PROBE AMP (~~LOC~~) NOT AT ARMC
Chlamydia: NEGATIVE
Comment: NEGATIVE
Comment: NORMAL
Neisseria Gonorrhea: NEGATIVE

## 2021-04-01 LAB — GLUCOSE, CAPILLARY
Glucose-Capillary: 169 mg/dL — ABNORMAL HIGH (ref 70–99)
Glucose-Capillary: 139 mg/dL — ABNORMAL HIGH (ref 70–99)

## 2021-04-01 MED ORDER — DOCUSATE SODIUM 100 MG PO CAPS
100.0000 mg | ORAL_CAPSULE | Freq: Every day | ORAL | Status: DC
  Administered 2021-04-01: 100 mg via ORAL
  Filled 2021-04-01: qty 1

## 2021-04-01 MED ORDER — DOXYCYCLINE HYCLATE 50 MG PO CAPS: 100.0000 mg | ORAL_CAPSULE | Freq: Two times a day (BID) | ORAL | 0 refills | Status: AC

## 2021-04-01 MED ORDER — METRONIDAZOLE 500 MG PO TABS: 500.0000 mg | ORAL_TABLET | Freq: Two times a day (BID) | ORAL | 0 refills | Status: AC

## 2021-04-01 MED ORDER — IBUPROFEN 800 MG PO TABS
800.0000 mg | ORAL_TABLET | Freq: Three times a day (TID) | ORAL | 0 refills | Status: DC
  Filled 2021-04-01: qty 30, 10d supply, fill #0

## 2021-04-01 MED ORDER — INFLUENZA VAC SPLIT QUAD 0.5 ML IM SUSY
0.5000 mL | PREFILLED_SYRINGE | INTRAMUSCULAR | Status: AC
  Administered 2021-04-01: 0.5 mL via INTRAMUSCULAR
  Filled 2021-04-01: qty 0.5

## 2021-04-01 MED ORDER — METRONIDAZOLE 500 MG PO TABS
500.0000 mg | ORAL_TABLET | Freq: Two times a day (BID) | ORAL | 0 refills | Status: DC
  Filled 2021-04-01: qty 21, 11d supply, fill #0

## 2021-04-01 MED ORDER — IBUPROFEN 800 MG PO TABS: 800.0000 mg | ORAL_TABLET | Freq: Three times a day (TID) | ORAL | 0 refills | Status: DC

## 2021-04-01 MED ORDER — COVID-19MRNA BIVAL VACC PFIZER 30 MCG/0.3ML IM SUSP
0.3000 mL | INTRAMUSCULAR | Status: AC
  Administered 2021-04-01: 0.3 mL via INTRAMUSCULAR
  Filled 2021-04-01: qty 0.3

## 2021-04-01 MED ORDER — INFLUENZA VAC SPLIT QUAD 0.5 ML IM SUSY: 0.5000 mL | PREFILLED_SYRINGE | INTRAMUSCULAR | Status: DC

## 2021-04-01 MED ORDER — DOXYCYCLINE HYCLATE 50 MG PO CAPS
100.0000 mg | ORAL_CAPSULE | Freq: Two times a day (BID) | ORAL | 0 refills | Status: DC
  Filled 2021-04-01: qty 48, 12d supply, fill #0

## 2021-04-01 NOTE — Care Management (Signed)
  [facility] Medication Assistance Card Name: Kaleigh Spiegelman ID (MRN): [id number] Bin: [id number] RX Group: [id number] Discharge Date: 04/01/2021 Expiration Date: 04/11/2021                                           (must be filled within 7 days of discharge       Dear   : Robyn [name]  You have been approved to have the prescriptions written by your discharging physician filled through our [facility] (Medication Assistance Through [facility]) program. This program allows for a one-time (no refills) 34-day supply of selected medications for a low copay amount.  The copay is $3.00 per prescription. For instance, if you have one prescription, you will pay $3.00; for two prescriptions, you pay $6.00; for three prescriptions, you pay $9.00; and so on.  Only certain pharmacies are participating in this program with [facility]. You will need to select one of the pharmacies from the attached list and take your prescriptions, this letter, and your photo ID to one of the participating pharmacies.   We are excited that you are able to use the [facility] program to get your medications. These prescriptions must be filled within 7 days of hospital discharge or they will no longer be valid for the [facility] program. Should you have any problems with your prescriptions please contact your case management team member at [phone] for [place]/ [facility].  Thank you, [facility] Management

## 2021-04-01 NOTE — Progress Notes (Addendum)
Subjective: Patient reports abdominal pain. She notes that it is slightly improved from yesterday but remains 6/10. She states that the IV dilaudid did help but little relief from oxycotin. She has not had a bowel movement in three days nd is feeling uncomfortable. She denies fever or chills.   Objective: I have reviewed patient's vital signs and intake and output.  General: alert, cooperative GI: normal findings: soft, +tender to palpation left>>right with rebound, no guarding. Abdomen non-distended, no acute peritonitis signs.  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  Will add colace for constipation. Patient does not desire laxative at this time.   Oley Balm 04/01/2021, 8:17 AM

## 2021-04-01 NOTE — Progress Notes (Signed)
Subjective: Patient reports doing better and wants to go home.  She said something is going on with one of her children and she feels better so she's ready to go.  No BM in 3 days.  Objective: I have reviewed patient's vital signs.  General: alert and no distress Resp: clear to auscultation bilaterally Cardio: regular rate and rhythm, S1, S2 normal, no murmur, click, rub or gallop Extremities: no calf tenderness Vaginal Bleeding: none   Assessment/Plan: Admitted d/t pain and left TOA  She is doing better today after IV antibiotics Long term POC discussed Pt wants to discuss further at office visit Precautions reviewed Discussed rec for BM Pt currently not on [facility] I reviewed recommendation Will have pt f/u in office in 2 and 6wks I discussed plan with Irving Burton, CNM on call who will contact ID and get recs on which abx to go home on for a total of 14 days Questions answered  LOS: 2 days    Purcell Nails 04/01/2021, 4:21 PM

## 2021-04-01 NOTE — Consult Note (Addendum)
[facility]  Total days of antibiotics 3       Reason for Consult: left TOA    Referring Physician: Pinn  Active Problems:   TOA (tubo-ovarian abscess)   Tubo-ovarian abscess    HPI: Robyn [name] is a 40 y.o. female hx of DM, hx of diverticulosis s/p drainage of diverticular abscess, hx of TOA abscess in 2021. Now presents with 5 day history of left lower abdominal pain, chills, and 2 days of diarrhea. She was found to have mild leukocytosis of 10K. Clue cells are presentImaging showed  Recurrent left-sided tubo-ovarian abscess measuring 4.7 x 3.4 x 4.9 cm. And4.3 cm right adnexal cyst which appears simple and without rim enhancement.  She was started on ceftiraonxe, metronidazole, and doxycycline. She is feeling improved. Consultation asked on o/p abtx course. Past Medical History:  Diagnosis Date   Anxiety    h/o panic attacks   Diabetes mellitus without complication (HCC)    diet controlled    Diverticulosis 02/16/2019   Herpes    Hypertension    Left ovarian cyst 11/29/2011   PCOS (polycystic ovarian syndrome)    Pregnancy induced hypertension    Shortness of breath dyspnea    with exertion and while pregnant   Smoker 11/29/2011    Allergies: No Known Allergies   MEDICATIONS:  docusate sodium  100 mg Oral Daily   losartan  50 mg Oral Daily   And   hydrochlorothiazide  12.5 mg Oral Daily   ibuprofen  800 mg Oral Q8H   metFORMIN  1,000 mg Oral BID WC   sodium chloride flush  3 mL Intravenous Once    Social History   Tobacco Use   Smoking status: Every Day    Years: 13.00    Types: Cigarettes   Smokeless tobacco: Never  Vaping Use   Vaping Use: Never used  Substance Use Topics   Alcohol use: Yes    Comment: socially and rarely   Drug use: No    Family History  Problem Relation Age of Onset   Hypertension Mother    Diabetes Father    Stroke Father    Cancer Maternal Grandmother    Cancer Maternal Uncle     Review of  Systems -    OBJECTIVE: Temp:  [97.7 F (36.5 C)-99.1 F (37.3 C)] 97.7 F (36.5 C) (11/04 0830) Pulse Rate:  [65-72] 68 (11/04 0830) Resp:  [17-18] 17 (11/04 0830) BP: (117-132)/(80-82) 129/82 (11/04 0830) SpO2:  [95 %-100 %] 99 % (11/04 0830)   LABS: Results for orders placed or performed during the hospital encounter of 03/30/21 (from the past 48 hour(s))  Lactic acid, plasma     Status: None   Collection Time: 03/30/21  5:39 PM  Result Value Ref Range   Lactic Acid, Venous 0.8 0.5 - 1.9 mmol/L    Comment: Performed at [facility], [address], [place], [place] [address]  CBC WITH DIFFERENTIAL     Status: None   Collection Time: 03/31/21  1:22 AM  Result Value Ref Range   WBC 9.8 4.0 - 10.5 K/uL   RBC 4.27 3.87 - 5.11 MIL/uL   Hemoglobin 12.1 12.0 - 15.0 g/dL   HCT 56.4 33.2 - 95.1 %   MCV 87.8 80.0 - 100.0 fL   MCH 28.3 26.0 - 34.0 pg   MCHC 32.3 30.0 - 36.0 g/dL   RDW 88.4 16.6 - 06.3 %   Platelets 227 150 -  400 K/uL   nRBC 0.0 0.0 - 0.2 %   Neutrophils Relative % 65 %   Neutro Abs 6.4 1.7 - 7.7 K/uL   Lymphocytes Relative 24 %   Lymphs Abs 2.4 0.7 - 4.0 K/uL   Monocytes Relative 9 %   Monocytes Absolute 0.9 0.1 - 1.0 K/uL   Eosinophils Relative 1 %   Eosinophils Absolute 0.1 0.0 - 0.5 K/uL   Basophils Relative 1 %   Basophils Absolute 0.1 0.0 - 0.1 K/uL   Immature Granulocytes 0 %   Abs Immature Granulocytes 0.03 0.00 - 0.07 K/uL    Comment: Performed at [facility], [address], [place], [place] [address]  Glucose, capillary     Status: Abnormal   Collection Time: 03/31/21 10:02 PM  Result Value Ref Range   Glucose-Capillary 159 (H) 70 - 99 mg/dL    Comment: Glucose reference range applies only to samples taken after fasting for at least 8 hours.  Glucose, capillary     Status: Abnormal   Collection Time: 04/01/21  9:39 AM  Result Value Ref Range   Glucose-Capillary 169 (H) 70 - 99 mg/dL    Comment: Glucose reference range applies  only to samples taken after fasting for at least 8 hours.    MICRO: reviewed IMAGING: No results found.   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 Improving on Iv abtx  - recommend to discharge on 12 days of doxycycline 100mg  PO BID plus metronidazole 500mg  PO BID to complete course - follow up with Dr at conclusion of treatment or sooner should symptoms worsen - recommend to check HIV ab testing as part of routine health/wellness - please offer flu vaccine and covid booster if she has not received them as of yet  -will give flu and bivalent covid prior to discharge  Moana Munford B. MD MPH [facility] [phone]

## 2021-04-01 NOTE — Discharge Summary (Signed)
OB Discharge Summary     Patient Name: Robyn [name] DOB: 12/10/1980 MRN: [id number]  Date of admission: 03/30/2021 Date of discharge: 04/01/2021  Admitting diagnosis: Tubo-ovarian abscess [N70.93] TOA (tubo-ovarian abscess) [N70.93] Diarrhea, unspecified type [R19.7] Left lower quadrant abdominal pain [R10.32] Intrauterine pregnancy: Unknown     Secondary diagnosis:  Active Problems:   TOA (tubo-ovarian abscess)   Tubo-ovarian abscess                                                                                                Hospital course:  Patient was admitted for treatment of tubo-ovarian cyst. She received IV antibiotic and medications for pain control. She has remained afebrile over the last 24 hours.   Physical exam  Vitals:   03/31/21 1556 03/31/21 2100 04/01/21 0424 04/01/21 0830  BP: 125/85 132/80 117/80 129/82  Pulse: 75 72 65 68  Resp: 17 18 17 17   Temp: 97.6 F (36.4 C) 99.1 F (37.3 C) 98.2 F (36.8 C) 97.7 F (36.5 C)  TempSrc: Oral Oral Oral Oral  SpO2: 94% 100% 95% 99%  Weight:      Height:        Labs: Lab Results  Component Value Date   WBC 9.8 03/31/2021   HGB 12.1 03/31/2021   HCT 37.5 03/31/2021   MCV 87.8 03/31/2021   PLT 227 03/31/2021   CMP Latest Ref Rng & Units 03/30/2021  Glucose 70 - 99 mg/dL 13/06/2020)  BUN 6 - 20 mg/dL 11  Creatinine 092(Z - 3.00 mg/dL 7.62  Sodium 2.63 - 335 mmol/L 137  Potassium 3.5 - 5.1 mmol/L 3.8  Chloride 98 - 111 mmol/L 101  CO2 22 - 32 mmol/L 26  Calcium 8.9 - 10.3 mg/dL 9.4  Total Protein 6.5 - 8.1 g/dL 456)  Total Bilirubin 0.3 - 1.2 mg/dL 0.4  Alkaline Phos 38 - 126 U/L 69  AST 15 - 41 U/L 12(L)  ALT 0 - 44 U/L 12    Discharge instruction: per After Visit Summary and "Baby and Me Booklet".  After visit meds:  Allergies as of 04/01/2021   No Known Allergies      Medication List     STOP taking these medications    amoxicillin 500 MG capsule Commonly known as: AMOXIL    diclofenac Sodium 1 % Gel Commonly known as: Voltaren   fluconazole 150 MG tablet Commonly known as: Diflucan   HYDROcodone-acetaminophen 5-325 MG tablet Commonly known as: NORCO/VICODIN   lidocaine 5 % Commonly known as: Lidoderm   losartan 50 MG tablet Commonly known as: COZAAR   methocarbamol 500 MG tablet Commonly known as: ROBAXIN   naproxen 375 MG tablet Commonly known as: NAPROSYN       TAKE these medications    doxycycline 50 MG capsule Commonly known as: VIBRAMYCIN Take 2 capsules (100 mg total) by mouth 2 (two) times daily for 12 days.   ibuprofen 800 MG tablet Commonly known as: ADVIL Take 1 tablet (800 mg total) by mouth every 8 (eight) hours.   losartan-hydrochlorothiazide 50-12.5 MG tablet Commonly known as: HYZAAR Take 1 tablet  by mouth daily.   metFORMIN 500 MG 24 hr tablet Commonly known as: GLUCOPHAGE-XR Take 1,000 mg by mouth 2 (two) times daily.   metroNIDAZOLE 500 MG tablet Commonly known as: Flagyl Take 1 tablet (500 mg total) by mouth 2 (two) times daily for 12 days.        Diet: carb modified diet  Activity: Advance as tolerated. Pelvic rest for 6 weeks.  Medications: outpatient Doxycycline 100mg  PO BID x12 days and Metronidazole 500mg  PO BID x 12 days after consultation with ID.   Outpatient follow up:04/15/2022 0930 with Dr.   POC reviewed with Dr. 04/17/2022   04/01/2021 Su Hilt, CNM

## 2021-04-01 NOTE — Progress Notes (Signed)
Mobility Specialist Progress Note:   04/01/21 1235  Mobility  Activity Ambulated in hall  Level of Assistance Independent  Assistive Device None  Distance Ambulated (ft) 560 ft  Mobility Ambulated independently in hallway  Mobility Response Tolerated well  Mobility performed by Mobility specialist  $Mobility charge 1 Mobility   Pt asx during ambulation. Stated pain is much better. Eager for d/c.  Addison Lank Mobility Specialist  Phone [phone]

## 2021-08-05 ENCOUNTER — Other Ambulatory Visit: Payer: Self-pay | Admitting: Registered Nurse

## 2021-08-05 ENCOUNTER — Ambulatory Visit
Admission: RE | Admit: 2021-08-05 | Discharge: 2021-08-05 | Disposition: A | Discharge status: Home / Self Care | Payer: BLUE CROSS/BLUE SHIELD | Source: Ambulatory Visit | Attending: Registered Nurse | Admitting: Registered Nurse

## 2021-08-05 DIAGNOSIS — R109 Unspecified abdominal pain: Secondary | ICD-10-CM

## 2021-08-05 MED ORDER — IOPAMIDOL (ISOVUE-300) INJECTION 61%
100.0000 mL | Freq: Once | INTRAVENOUS | Status: AC | PRN
  Administered 2021-08-05: 100 mL via INTRAVENOUS

## 2021-10-06 ENCOUNTER — Emergency Department (HOSPITAL_BASED_OUTPATIENT_CLINIC_OR_DEPARTMENT_OTHER)
Admission: EM | Admit: 2021-10-06 | Discharge: 2021-10-06 | Disposition: A | Discharge status: Home / Self Care | Payer: BLUE CROSS/BLUE SHIELD | Attending: Emergency Medicine | Admitting: Emergency Medicine

## 2021-10-06 ENCOUNTER — Other Ambulatory Visit: Payer: Self-pay

## 2021-10-06 ENCOUNTER — Encounter (HOSPITAL_BASED_OUTPATIENT_CLINIC_OR_DEPARTMENT_OTHER): Payer: Self-pay

## 2021-10-06 DIAGNOSIS — N898 Other specified noninflammatory disorders of vagina: Secondary | ICD-10-CM | POA: Diagnosis present

## 2021-10-06 DIAGNOSIS — B9689 Other specified bacterial agents as the cause of diseases classified elsewhere: Secondary | ICD-10-CM

## 2021-10-06 DIAGNOSIS — N760 Acute vaginitis: Secondary | ICD-10-CM | POA: Diagnosis not present

## 2021-10-06 LAB — URINALYSIS, ROUTINE W REFLEX MICROSCOPIC
Bilirubin Urine: NEGATIVE
Glucose, UA: NEGATIVE mg/dL
Ketones, ur: NEGATIVE mg/dL
Leukocytes,Ua: NEGATIVE
Nitrite: NEGATIVE
Protein, ur: NEGATIVE mg/dL
Specific Gravity, Urine: 1.025 (ref 1.005–1.030)
pH: 5.5 (ref 5.0–8.0)

## 2021-10-06 LAB — URINALYSIS, MICROSCOPIC (REFLEX)

## 2021-10-06 LAB — PREGNANCY, URINE: Preg Test, Ur: NEGATIVE

## 2021-10-06 LAB — WET PREP, GENITAL
Sperm: NONE SEEN
Trich, Wet Prep: NONE SEEN
WBC, Wet Prep HPF POC: >=10 — AB (ref <10)
Yeast Wet Prep HPF POC: NONE SEEN

## 2021-10-06 LAB — HIV ANTIBODY (ROUTINE TESTING W REFLEX): HIV Screen 4th Generation wRfx: NONREACTIVE

## 2021-10-06 LAB — HCG, SERUM, QUALITATIVE: Preg, Serum: NEGATIVE

## 2021-10-06 MED ORDER — METRONIDAZOLE 500 MG PO TABS: 500.0000 mg | ORAL_TABLET | Freq: Two times a day (BID) | ORAL | 0 refills | Status: AC

## 2021-10-06 NOTE — ED Triage Notes (Signed)
Patient states the last two months she has had brown discharge - today she is having cramping and vomited today.  ?Patient st she did take a pregnancy test and there was a faint line. ?

## 2021-10-06 NOTE — ED Provider Notes (Signed)
?[facility] EMERGENCY DEPARTMENT ?Provider Note ? ? ?CSN: [id number] ?Arrival date & time: 10/06/21  1359 ? ?  ? ?History ? ?Chief Complaint  ?Patient presents with  ? Emesis  ? Vaginal Discharge  ? ? ?Robyn [name] is a 41 y.o. female. ? ?Patient presents with abnormal periods since February but increased discharge with spotting for the last week.  She took an at-home pregnancy test which initially had a faint positive line but then took a second test which came back negative.  Denies any abnormal discharge but reports pelvic pain "like cramping".  Reports that previous pregnancy she had a negative urine but positive blood test.  Is sexually active without protection or birth control.  Denies any desire to get pregnant. ? ? ?  ? ?Home Medications ?Prior to Admission medications   ?Medication Sig Start Date End Date Taking? Authorizing Provider  ?metroNIDAZOLE (FLAGYL) 500 MG tablet Take 1 tablet (500 mg total) by mouth 2 (two) times daily for 7 days. 10/06/21 10/13/21 Yes Derrel Nip, MD  ?ibuprofen (ADVIL) 800 MG tablet Take 1 tablet (800 mg total) by mouth every 8 (eight) hours. 04/01/21   Essie Hart, MD  ?losartan-hydrochlorothiazide (HYZAAR) 50-12.5 MG tablet Take 1 tablet by mouth daily. 02/26/21   [provider]  ?metFORMIN (GLUCOPHAGE-XR) 500 MG 24 hr tablet Take 1,000 mg by mouth 2 (two) times daily. 12/23/18   [provider]  ?   ? ?Allergies    ?Patient has no known allergies.   ? ?Review of Systems   ?Review of Systems  ?Constitutional:  Negative for fever.  ?HENT:  Negative for congestion and sinus pain.   ?Gastrointestinal:  Positive for diarrhea and vomiting.  ?Genitourinary:  Positive for dyspareunia and vaginal pain. Negative for dysuria and vaginal discharge.  ?All other systems reviewed and are negative. ? ?Physical Exam ?Updated Vital Signs ?BP (!) 186/95 (BP Location: Left Arm)   Pulse 73   Temp 98.3 ?F (36.8 ?C) (Oral)   Resp 20   Ht 5\' 7"  (1.702 m)   Wt  122.5 kg   LMP 07/04/2021   SpO2 100%   BMI 42.29 kg/m?  ?Physical Exam ?Vitals and nursing note reviewed. Exam conducted with a chaperone present.  ?Constitutional:   ?   General: She is not in acute distress. ?   Appearance: She is well-developed.  ?HENT:  ?   Head: Normocephalic and atraumatic.  ?Eyes:  ?   Conjunctiva/sclera: Conjunctivae normal.  ?Cardiovascular:  ?   Rate and Rhythm: Normal rate and regular rhythm.  ?   Heart sounds: No murmur heard. ?Pulmonary:  ?   Effort: Pulmonary effort is normal. No respiratory distress.  ?   Breath sounds: Normal breath sounds.  ?Abdominal:  ?   Palpations: Abdomen is soft.  ?   Tenderness: There is no abdominal tenderness.  ?Genitourinary: ?   General: Normal vulva.  ?   Exam position: Knee-chest position.  ?   Labia:     ?   Right: No rash or tenderness.     ?   Left: No rash or tenderness.   ?   Vagina: Vaginal discharge and bleeding present. No erythema, tenderness or lesions.  ?   Cervix: Cervical bleeding present. No friability or erythema.  ?   Uterus: Normal. Not tender.   ?   Adnexa: Right adnexa normal and left adnexa normal.  ?Musculoskeletal:     ?   General: No swelling.  ?   Cervical  back: Neck supple.  ?Skin: ?   General: Skin is warm and dry.  ?   Capillary Refill: Capillary refill takes less than 2 seconds.  ?Neurological:  ?   Mental Status: She is alert.  ?Psychiatric:     ?   Mood and Affect: Mood normal.  ? ? ?ED Results / Procedures / Treatments   ?Labs ?(all labs ordered are listed, but only abnormal results are displayed) ?Labs Reviewed  ?WET PREP, GENITAL - Abnormal; Notable for the following components:  ?    Result Value  ? Clue Cells Wet Prep HPF POC PRESENT (*)   ? WBC, Wet Prep HPF POC >=10 (*)   ? All other components within normal limits  ?URINALYSIS, ROUTINE W REFLEX MICROSCOPIC - Abnormal; Notable for the following components:  ? Hgb urine dipstick SMALL (*)   ? All other components within normal limits  ?URINALYSIS, MICROSCOPIC  (REFLEX) - Abnormal; Notable for the following components:  ? Bacteria, UA RARE (*)   ? All other components within normal limits  ?PREGNANCY, URINE  ?HCG, SERUM, QUALITATIVE  ?RPR  ?HIV ANTIBODY (ROUTINE TESTING W REFLEX)  ?GC/CHLAMYDIA PROBE AMP ([place]) NOT AT [facility]  ? ? ?EKG ?None ? ?Radiology ?No results found. ? ?Procedures ?Procedures  ? ? ?Medications Ordered in ED ?Medications - No data to display ? ?ED Course/ Medical Decision Making/ A&P ?  ?                        ?Medical Decision Making ?Patient presenting for pelvic pain as well as cramping.  On possibly positive pregnancy test at home and 1 negative home pregnancy test.  Urine pregnancy test today negative.  Blood pregnancy test collected.  Wet prep collected positive for BV.  Prescription sent for metronidazole to patient's pharmacy.  Gonorrhea/chlamydia as well as HIV and RPR collected.  Serum hCG pending at time for shift change.  Handed off to oncoming provider who will discharge after results come back.  Discussed treatment with the patient and return precautions.  No further questions or concerns. ? ?Amount and/or Complexity of Data Reviewed ?Labs: ordered. ? ? ?Final Clinical Impression(s) / ED Diagnoses ?Final diagnoses:  ?BV (bacterial vaginosis)  ? ? ?Rx / DC Orders ?ED Discharge Orders   ? ?      Ordered  ?  metroNIDAZOLE (FLAGYL) 500 MG tablet  2 times daily       ? 10/06/21 1454  ? ?  ?  ? ?  ? ? ?  ?Derrel Nip, MD ?10/06/21 1500 ? ?  ?Jacalyn Lefevre, MD ?10/07/21 [phone] ? ?

## 2021-10-06 NOTE — Discharge Instructions (Addendum)
It was wonderful taking care of you today.  We collected samples that did show you have a infection with bacterial vaginosis.  I have attached information on this.  It is not an STD.  We did collect tests for STD those results have not returned yet and will take some time to come back.  For treatment for the bacterial vaginosis sent a prescription for metronidazole to your pharmacy.  You will take this twice daily for 7 days.  Regarding your abnormal bleeding I recommend follow-up with your gynecologist.  I hope you have a wonderful day! ?

## 2021-10-07 LAB — GC/CHLAMYDIA PROBE AMP (~~LOC~~) NOT AT ARMC
Chlamydia: NEGATIVE
Comment: NEGATIVE
Comment: NORMAL
Neisseria Gonorrhea: NEGATIVE

## 2021-10-07 LAB — RPR: RPR Ser Ql: NONREACTIVE

## 2021-11-01 ENCOUNTER — Emergency Department (HOSPITAL_BASED_OUTPATIENT_CLINIC_OR_DEPARTMENT_OTHER): Payer: BLUE CROSS/BLUE SHIELD

## 2021-11-01 ENCOUNTER — Encounter (HOSPITAL_BASED_OUTPATIENT_CLINIC_OR_DEPARTMENT_OTHER): Payer: Self-pay | Admitting: Emergency Medicine

## 2021-11-01 ENCOUNTER — Emergency Department (HOSPITAL_BASED_OUTPATIENT_CLINIC_OR_DEPARTMENT_OTHER)
Admission: EM | Admit: 2021-11-01 | Discharge: 2021-11-02 | Disposition: A | Discharge status: Home / Self Care | Payer: BLUE CROSS/BLUE SHIELD | Attending: Emergency Medicine | Admitting: Emergency Medicine

## 2021-11-01 ENCOUNTER — Ambulatory Visit (INDEPENDENT_AMBULATORY_CARE_PROVIDER_SITE_OTHER)
Admission: EM | Admit: 2021-11-01 | Discharge: 2021-11-01 | Disposition: A | Discharge status: Other Acute Inpatient Hospital | Payer: BLUE CROSS/BLUE SHIELD | Source: Home / Self Care

## 2021-11-01 DIAGNOSIS — F17210 Nicotine dependence, cigarettes, uncomplicated: Secondary | ICD-10-CM | POA: Insufficient documentation

## 2021-11-01 DIAGNOSIS — Z79899 Other long term (current) drug therapy: Secondary | ICD-10-CM | POA: Insufficient documentation

## 2021-11-01 DIAGNOSIS — I10 Essential (primary) hypertension: Secondary | ICD-10-CM | POA: Insufficient documentation

## 2021-11-01 DIAGNOSIS — N83202 Unspecified ovarian cyst, left side: Secondary | ICD-10-CM | POA: Insufficient documentation

## 2021-11-01 DIAGNOSIS — R399 Unspecified symptoms and signs involving the genitourinary system: Secondary | ICD-10-CM | POA: Insufficient documentation

## 2021-11-01 DIAGNOSIS — Z7984 Long term (current) use of oral hypoglycemic drugs: Secondary | ICD-10-CM | POA: Insufficient documentation

## 2021-11-01 DIAGNOSIS — R109 Unspecified abdominal pain: Secondary | ICD-10-CM | POA: Insufficient documentation

## 2021-11-01 DIAGNOSIS — K5732 Diverticulitis of large intestine without perforation or abscess without bleeding: Secondary | ICD-10-CM | POA: Diagnosis not present

## 2021-11-01 DIAGNOSIS — E119 Type 2 diabetes mellitus without complications: Secondary | ICD-10-CM | POA: Diagnosis not present

## 2021-11-01 DIAGNOSIS — M545 Low back pain, unspecified: Secondary | ICD-10-CM | POA: Diagnosis present

## 2021-11-01 LAB — POCT URINALYSIS DIP (MANUAL ENTRY)
Bilirubin, UA: NEGATIVE
Glucose, UA: NEGATIVE mg/dL
Ketones, POC UA: NEGATIVE mg/dL
Nitrite, UA: NEGATIVE
Protein Ur, POC: 30 mg/dL — AB
Spec Grav, UA: 1.02 (ref 1.010–1.025)
Urobilinogen, UA: 0.2 E.U./dL
pH, UA: 6 (ref 5.0–8.0)

## 2021-11-01 LAB — URINALYSIS, MICROSCOPIC (REFLEX)

## 2021-11-01 LAB — CBC WITH DIFFERENTIAL/PLATELET
Abs Immature Granulocytes: 0.07 10*3/uL (ref 0.00–0.07)
Basophils Absolute: 0.1 10*3/uL (ref 0.0–0.1)
Basophils Relative: 1 %
Eosinophils Absolute: 0.1 10*3/uL (ref 0.0–0.5)
Eosinophils Relative: 1 %
HCT: 38.1 % (ref 36.0–46.0)
Hemoglobin: 12.9 g/dL (ref 12.0–15.0)
Immature Granulocytes: 1 %
Lymphocytes Relative: 19 %
Lymphs Abs: 2.5 10*3/uL (ref 0.7–4.0)
MCH: 28.1 pg (ref 26.0–34.0)
MCHC: 33.9 g/dL (ref 30.0–36.0)
MCV: 83 fL (ref 80.0–100.0)
Monocytes Absolute: 1 10*3/uL (ref 0.1–1.0)
Monocytes Relative: 7 %
Neutro Abs: 9.3 10*3/uL — ABNORMAL HIGH (ref 1.7–7.7)
Neutrophils Relative %: 71 %
Platelets: 279 10*3/uL (ref 150–400)
RBC: 4.59 MIL/uL (ref 3.87–5.11)
RDW: 13.9 % (ref 11.5–15.5)
WBC: 13.1 10*3/uL — ABNORMAL HIGH (ref 4.0–10.5)
nRBC: 0 % (ref 0.0–0.2)

## 2021-11-01 LAB — PREGNANCY, URINE: Preg Test, Ur: NEGATIVE

## 2021-11-01 LAB — URINALYSIS, ROUTINE W REFLEX MICROSCOPIC
Bilirubin Urine: NEGATIVE
Glucose, UA: NEGATIVE mg/dL
Ketones, ur: NEGATIVE mg/dL
Nitrite: NEGATIVE
Protein, ur: NEGATIVE mg/dL
Specific Gravity, Urine: 1.025 (ref 1.005–1.030)
pH: 5.5 (ref 5.0–8.0)

## 2021-11-01 LAB — COMPREHENSIVE METABOLIC PANEL
ALT: 16 U/L (ref 0–44)
AST: 14 U/L — ABNORMAL LOW (ref 15–41)
Albumin: 3.5 g/dL (ref 3.5–5.0)
Alkaline Phosphatase: 73 U/L (ref 38–126)
Anion gap: 10 (ref 5–15)
BUN: 11 mg/dL (ref 6–20)
CO2: 28 mmol/L (ref 22–32)
Calcium: 9.3 mg/dL (ref 8.9–10.3)
Chloride: 97 mmol/L — ABNORMAL LOW (ref 98–111)
Creatinine, Ser: 0.68 mg/dL (ref 0.44–1.00)
GFR, Estimated: >60 mL/min (ref >60)
Glucose, Bld: 149 mg/dL — ABNORMAL HIGH (ref 70–99)
Potassium: 3.1 mmol/L — ABNORMAL LOW (ref 3.5–5.1)
Sodium: 135 mmol/L (ref 135–145)
Total Bilirubin: 0.3 mg/dL (ref 0.3–1.2)
Total Protein: 8.2 g/dL — ABNORMAL HIGH (ref 6.5–8.1)

## 2021-11-01 LAB — LACTIC ACID, PLASMA: Lactic Acid, Venous: 0.8 mmol/L (ref 0.5–1.9)

## 2021-11-01 MED ORDER — ONDANSETRON HCL 4 MG/2ML IJ SOLN
4.0000 mg | Freq: Once | INTRAMUSCULAR | Status: AC
  Administered 2021-11-01: 4 mg via INTRAVENOUS
  Filled 2021-11-01: qty 2

## 2021-11-01 MED ORDER — IOHEXOL 300 MG/ML  SOLN
125.0000 mL | Freq: Once | INTRAMUSCULAR | Status: AC | PRN
  Administered 2021-11-01: 125 mL via INTRAVENOUS

## 2021-11-01 MED ORDER — ACETAMINOPHEN 325 MG PO TABS
650.0000 mg | ORAL_TABLET | Freq: Once | ORAL | Status: AC | PRN
Start: 2021-11-01 — End: 2021-11-01
  Administered 2021-11-01: 650 mg via ORAL
  Filled 2021-11-01: qty 2

## 2021-11-01 MED ORDER — FENTANYL CITRATE PF 50 MCG/ML IJ SOSY
100.0000 ug | PREFILLED_SYRINGE | Freq: Once | INTRAMUSCULAR | Status: AC
  Administered 2021-11-01: 100 ug via INTRAVENOUS
  Filled 2021-11-01: qty 2

## 2021-11-01 NOTE — ED Triage Notes (Signed)
Pt reports lower back pain and generalized abdominal pain since Thursday. Denies n/v. Recently dx with BV and abscess on left ovary.

## 2021-11-01 NOTE — ED Provider Notes (Signed)
[facility] Provider Note: Lowella Dell, MD, FACEP  CSN: [id number] MRN: [id number] ARRIVAL: 11/01/21 at 2145 ROOM: MH04/MH04   CHIEF COMPLAINT  Back Pain   HISTORY OF PRESENT ILLNESS  11/01/21 11:17 PM Robyn [name] is a 41 y.o. female with epigastric pain, suprapubic pain and low back pain for the past 3 days (triage note indicates 5 days).  She describes the pain as crampy and rates it as a 10 out of 10.  It is worse with movement or palpation.  She has had what she describes as a.  But no abnormal vaginal bleeding or discharge.  She denies dysuria.  She has had no nausea or vomiting apart from vomiting after being given IV contrast and CT scan.  She was seen at an urgent care earlier today and because she had a fever and her scopic hematuria she was sent here for further evaluation.  Her temperature on arrival was 100.6.    Past Medical History:  Diagnosis Date   Anxiety    h/o panic attacks   Diabetes mellitus without complication (HCC)    diet controlled    Diverticulosis 02/16/2019   Herpes    Hypertension    Left ovarian cyst 11/29/2011   PCOS (polycystic ovarian syndrome)    Pregnancy induced hypertension    Shortness of breath dyspnea    with exertion and while pregnant   Smoker 11/29/2011    Past Surgical History:  Procedure Laterality Date   CERVICAL CERCLAGE N/A 10/09/2014   Procedure: CERCLAGE CERVICAL;  Surgeon: Osborn Coho, MD;  Location: [facility];  Service: Gynecology;  Laterality: N/A;   CESAREAN SECTION     C/S x 2   CESAREAN SECTION WITH BILATERAL TUBAL LIGATION Bilateral 03/23/2015   Procedure: CESAREAN SECTION WITH BILATERAL TUBAL LIGATION;  Surgeon: Osborn Coho, MD;  Location: [facility];  Service: Obstetrics;  Laterality: Bilateral;  Amt OK'd to move 02/25/2015   FLEXIBLE SIGMOIDOSCOPY N/A 05/08/2019   Procedure: FLEXIBLE SIGMOIDOSCOPY;  Surgeon: Romie Levee, MD;  Location: [facility];  Service: Endoscopy;  Laterality: N/A;   IR  RADIOLOGIST EVAL & MGMT  02/20/2019   IR RADIOLOGIST EVAL & MGMT  03/06/2019   IR RADIOLOGIST EVAL & MGMT  08/21/2019   WISDOM TOOTH EXTRACTION      Family History  Problem Relation Age of Onset   Hypertension Mother    Diabetes Father    Stroke Father    Cancer Maternal Grandmother    Cancer Maternal Uncle     Social History   Tobacco Use   Smoking status: Every Day    Years: 13.00    Types: Cigarettes   Smokeless tobacco: Never  Vaping Use   Vaping Use: Never used  Substance Use Topics   Alcohol use: Yes    Comment: socially and rarely   Drug use: No    Prior to Admission medications   Medication Sig Start Date End Date Taking? Authorizing Provider  ibuprofen (ADVIL) 800 MG tablet Take 1 tablet (800 mg total) by mouth every 8 (eight) hours. 04/01/21   Essie Hart, MD  losartan-hydrochlorothiazide (HYZAAR) 50-12.5 MG tablet Take 1 tablet by mouth daily. 02/26/21   [provider]  metFORMIN (GLUCOPHAGE-XR) 500 MG 24 hr tablet Take 1,000 mg by mouth 2 (two) times daily. 12/23/18   [provider]    Allergies Patient has no known allergies.   REVIEW OF SYSTEMS  Negative except as noted here or in the History of Present Illness.  PHYSICAL EXAMINATION  Initial Vital Signs Blood pressure 114/64, pulse (!) 101, temperature (!) 100.6 F (38.1 C), temperature source Oral, resp. rate 18, height 5\' 8"  (1.727 m), weight 116.6 kg, last menstrual period 10/28/2021, SpO2 94 %.  Examination General: Well-developed, well-nourished female in no acute distress; appearance consistent with age of record HENT: normocephalic; atraumatic Eyes: pupils equal, round and reactive to light; extraocular muscles intact Neck: supple Heart: regular rate and rhythm Lungs: clear to auscultation bilaterally Abdomen: soft; nondistended; epigastric and suprapubic tenderness; bowel sounds present New: No CVA tenderness Extremities: No deformity; full range of motion; pulses  normal Neurologic: Awake, alert and oriented; motor function intact in all extremities and symmetric; no facial droop Skin: Warm and dry Psychiatric: Normal mood and affect   RESULTS  Summary of this visit's results, reviewed and interpreted by myself:   EKG Interpretation  Date/Time:    Ventricular Rate:    PR Interval:    QRS Duration:   QT Interval:    QTC Calculation:   R Axis:     Text Interpretation:         Laboratory Studies: Results for orders placed or performed during the hospital encounter of 11/01/21 (from the past 24 hour(s))  Urinalysis, Routine w reflex microscopic Urine, Clean Catch     Status: Abnormal   Collection Time: 11/01/21 10:10 PM  Result Value Ref Range   Color, Urine YELLOW YELLOW   APPearance CLOUDY (A) CLEAR   Specific Gravity, Urine 1.025 1.005 - 1.030   pH 5.5 5.0 - 8.0   Glucose, UA NEGATIVE NEGATIVE mg/dL   Hgb urine dipstick LARGE (A) NEGATIVE   Bilirubin Urine NEGATIVE NEGATIVE   Ketones, ur NEGATIVE NEGATIVE mg/dL   Protein, ur NEGATIVE NEGATIVE mg/dL   Nitrite NEGATIVE NEGATIVE   Leukocytes,Ua SMALL (A) NEGATIVE  Pregnancy, urine     Status: None   Collection Time: 11/01/21 10:10 PM  Result Value Ref Range   Preg Test, Ur NEGATIVE NEGATIVE  Urinalysis, Microscopic (reflex)     Status: Abnormal   Collection Time: 11/01/21 10:10 PM  Result Value Ref Range   RBC / HPF 11-20 0 - 5 RBC/hpf   WBC, UA 11-20 0 - 5 WBC/hpf   Bacteria, UA FEW (A) NONE SEEN   Squamous Epithelial / LPF 6-10 0 - 5  Comprehensive metabolic panel     Status: Abnormal   Collection Time: 11/01/21 10:39 PM  Result Value Ref Range   Sodium 135 135 - 145 mmol/L   Potassium 3.1 (L) 3.5 - 5.1 mmol/L   Chloride 97 (L) 98 - 111 mmol/L   CO2 28 22 - 32 mmol/L   Glucose, Bld 149 (H) 70 - 99 mg/dL   BUN 11 6 - 20 mg/dL   Creatinine, Ser 9.560.68 0.44 - 1.00 mg/dL   Calcium 9.3 8.9 - 38.710.3 mg/dL   Total Protein 8.2 (H) 6.5 - 8.1 g/dL   Albumin 3.5 3.5 - 5.0 g/dL    AST 14 (L) 15 - 41 U/L   ALT 16 0 - 44 U/L   Alkaline Phosphatase 73 38 - 126 U/L   Total Bilirubin 0.3 0.3 - 1.2 mg/dL   GFR, Estimated >56>60 >43>60 mL/min   Anion gap 10 5 - 15  CBC with Differential     Status: Abnormal   Collection Time: 11/01/21 10:39 PM  Result Value Ref Range   WBC 13.1 (H) 4.0 - 10.5 K/uL   RBC 4.59 3.87 - 5.11 MIL/uL  Hemoglobin 12.9 12.0 - 15.0 g/dL   HCT 62.0 35.5 - 97.4 %   MCV 83.0 80.0 - 100.0 fL   MCH 28.1 26.0 - 34.0 pg   MCHC 33.9 30.0 - 36.0 g/dL   RDW 16.3 84.5 - 36.4 %   Platelets 279 150 - 400 K/uL   nRBC 0.0 0.0 - 0.2 %   Neutrophils Relative % 71 %   Neutro Abs 9.3 (H) 1.7 - 7.7 K/uL   Lymphocytes Relative 19 %   Lymphs Abs 2.5 0.7 - 4.0 K/uL   Monocytes Relative 7 %   Monocytes Absolute 1.0 0.1 - 1.0 K/uL   Eosinophils Relative 1 %   Eosinophils Absolute 0.1 0.0 - 0.5 K/uL   Basophils Relative 1 %   Basophils Absolute 0.1 0.0 - 0.1 K/uL   Immature Granulocytes 1 %   Abs Immature Granulocytes 0.07 0.00 - 0.07 K/uL  Lactic acid, plasma     Status: None   Collection Time: 11/01/21 10:50 PM  Result Value Ref Range   Lactic Acid, Venous 0.8 0.5 - 1.9 mmol/L   Imaging Studies: No results found.  ED COURSE and MDM  Nursing notes, initial and subsequent vitals signs, including pulse oximetry, reviewed and interpreted by myself.  Vitals:   11/01/21 2210 11/01/21 2218 11/01/21 2220  BP: (!) 106/37 114/64   Pulse: 100 (!) 101   Resp: 18    Temp: (!) 100.6 F (38.1 C)    TempSrc: Oral    SpO2: 97% 94%   Weight:   116.6 kg  Height:   5\' 8"  (1.727 m)   Medications  acetaminophen (TYLENOL) tablet 650 mg (has no administration in time range)  iohexol (OMNIPAQUE) 300 MG/ML solution 125 mL (has no administration in time range)      PROCEDURES  Procedures   ED DIAGNOSES  No diagnosis found.

## 2021-11-01 NOTE — ED Notes (Signed)
Patient is being discharged from the Urgent Care and sent to the Emergency Department via POV . Per LG, patient is in need of higher level of care due to abd pain and fever. Patient is aware and verbalizes understanding of plan of care.  Vitals:   11/01/21 1113  BP: 105/72  Pulse: 96  Resp: 18  Temp: (!) 101.6 F (38.7 C)  SpO2: 94%

## 2021-11-01 NOTE — ED Triage Notes (Signed)
Pt presents with generalized abdominal pain and flank pain for past week; pt states she was recently treated for BV but only took 3 days of the medication before stopping.

## 2021-11-01 NOTE — Discharge Instructions (Addendum)
-  Sent to ED via POV ?-Declines AVS ?

## 2021-11-01 NOTE — ED Provider Notes (Signed)
[facility]    CSN: [id number] Arrival date & time: 11/01/21  1019      History   Chief Complaint Chief Complaint  Patient presents with   Abdominal Pain   Flank Pain    HPI Robyn [name] is a 41 y.o. female presenting with abdominal and flank pain for 1 week.  History left ovarian cyst, diverticulosis, diabetes.  She was at the emergency department for [place] on 10/17/2021, she took 3 days of metronidazole, but stated that it was making her sugars go up, and so she stopped it.  She currently denies any vaginal or urinary symptoms, including dysuria, frequency, hematuria, discharge, odor.  She describes severe epigastric and lower abdominal pain with radiation to the bilateral flanks.  She states that ibuprofen will reduce the pain, but it returns.  Symptoms getting progressively worse for the last week.  Patient is confident that it is not her ovarian cyst, though this was not removed; and she states that she knows she does not have kidney stones.  HPI  Past Medical History:  Diagnosis Date   Anxiety    h/o panic attacks   Diabetes mellitus without complication ([place])    diet controlled    Diverticulosis 02/16/2019   Herpes    Hypertension    Left ovarian cyst 11/29/2011   PCOS (polycystic ovarian syndrome)    Pregnancy induced hypertension    Shortness of breath dyspnea    with exertion and while pregnant   Smoker 11/29/2011    Patient Active Problem List   Diagnosis Date Noted   Tubo-ovarian abscess 08/06/2019   TOA (tubo-ovarian abscess) 08/05/2019   Colonic diverticular abscess 02/10/2019   Diverticulitis 02/09/2019   S/P repeat low transverse C-section 03/23/2015   Hypertension - on Labetalol 200 mg po bid 11/22/2014   Diabetes mellitus type 2, uncontrolled, without complications -- dx'd in 2011 11/22/2014   Hx of incompetent cervix, currently pregnant - cerclage placed on 10/09/14 by Dr. Mancel Bale 11/22/2014   History of preterm delivery x 2 - weekly 17P  injections - first injection at 16.5 wks 11/22/2014   Elderly multigravida 11/22/2014   PCOS (polycystic ovarian syndrome) 08/21/2014   Herpes 08/21/2014   ANA positive 08/21/2014   Previous cesarean delivery, antepartum condition or complication x 2--previous vertical incision 08/21/2014   Obesity 05/20/2012    Past Surgical History:  Procedure Laterality Date   CERVICAL CERCLAGE N/A 10/09/2014   Procedure: CERCLAGE CERVICAL;  Surgeon: Everett Graff, MD;  Location: [facility];  Service: Gynecology;  Laterality: N/A;   CESAREAN SECTION     C/S x 2   CESAREAN SECTION WITH BILATERAL TUBAL LIGATION Bilateral 03/23/2015   Procedure: CESAREAN SECTION WITH BILATERAL TUBAL LIGATION;  Surgeon: Everett Graff, MD;  Location: [facility];  Service: Obstetrics;  Laterality: Bilateral;  Amt OK'd to move 02/25/2015   FLEXIBLE SIGMOIDOSCOPY N/A 05/08/2019   Procedure: FLEXIBLE SIGMOIDOSCOPY;  Surgeon: Leighton Ruff, MD;  Location: [facility];  Service: Endoscopy;  Laterality: N/A;   IR RADIOLOGIST EVAL & MGMT  02/20/2019   IR RADIOLOGIST EVAL & MGMT  03/06/2019   IR RADIOLOGIST EVAL & MGMT  08/21/2019   WISDOM TOOTH EXTRACTION      OB History     Gravida  5   Para  3   Term      Preterm  3   AB  2   Living  3      SAB  1   IAB  1   Ectopic  Multiple  0   Live Births  3            Home Medications    Prior to Admission medications   Medication Sig Start Date End Date Taking? Authorizing Provider  ibuprofen (ADVIL) 800 MG tablet Take 1 tablet (800 mg total) by mouth every 8 (eight) hours. 04/01/21   Sanjuana Kava, MD  losartan-hydrochlorothiazide (HYZAAR) 50-12.5 MG tablet Take 1 tablet by mouth daily. 02/26/21   [provider]  metFORMIN (GLUCOPHAGE-XR) 500 MG 24 hr tablet Take 1,000 mg by mouth 2 (two) times daily. 12/23/18   [provider]    Family History Family History  Problem Relation Age of Onset   Hypertension Mother    Diabetes Father     Stroke Father    Cancer Maternal Grandmother    Cancer Maternal Uncle     Social History Social History   Tobacco Use   Smoking status: Every Day    Years: 13.00    Types: Cigarettes   Smokeless tobacco: Never  Vaping Use   Vaping Use: Never used  Substance Use Topics   Alcohol use: Yes    Comment: socially and rarely   Drug use: No     Allergies   Patient has no known allergies.   Review of Systems Review of Systems  Constitutional:  Negative for appetite change, chills, diaphoresis and fever.  Respiratory:  Negative for shortness of breath.   Cardiovascular:  Negative for chest pain.  Gastrointestinal:  Positive for abdominal pain. Negative for blood in stool, constipation, diarrhea, nausea and vomiting.  Genitourinary:  Negative for decreased urine volume, difficulty urinating, dysuria, flank pain, frequency, genital sores, hematuria and urgency.  Musculoskeletal:  Negative for back pain.  Neurological:  Negative for dizziness, weakness and light-headedness.  All other systems reviewed and are negative.   Physical Exam Triage Vital Signs ED Triage Vitals  Enc Vitals Group     BP 11/01/21 1113 105/72     Pulse Rate 11/01/21 1113 96     Resp 11/01/21 1113 18     Temp 11/01/21 1113 (!) 101.6 F (38.7 C)     Temp Source 11/01/21 1113 Oral     SpO2 11/01/21 1113 94 %     Weight --      Height --      Head Circumference --      Peak Flow --      Pain Score 11/01/21 1118 8     Pain Loc --      Pain Edu? --      Excl. in [place]? --    No data found.  Updated Vital Signs BP 105/72 (BP Location: Left Arm)   Pulse 96   Temp (!) 101.6 F (38.7 C) (Oral)   Resp 18   LMP 10/28/2021   SpO2 94%   Visual Acuity Right Eye Distance:   Left Eye Distance:   Bilateral Distance:    Right Eye Near:   Left Eye Near:    Bilateral Near:     Physical Exam Vitals reviewed.  Constitutional:      General: She is not in acute distress.    Appearance: Normal  appearance. She is obese. She is not ill-appearing.  HENT:     Head: Normocephalic and atraumatic.     Mouth/Throat:     Mouth: Mucous membranes are moist.     Comments: Moist mucous membranes Eyes:     Extraocular Movements: Extraocular movements intact.  Pupils: Pupils are equal, round, and reactive to light.  Cardiovascular:     Rate and Rhythm: Normal rate and regular rhythm.     Heart sounds: Normal heart sounds.  Pulmonary:     Effort: Pulmonary effort is normal.     Breath sounds: Normal breath sounds. No wheezing, rhonchi or rales.  Abdominal:     General: Bowel sounds are normal. There is no distension.     Palpations: Abdomen is soft. There is no mass.     Tenderness: There is generalized abdominal tenderness. There is no right CVA tenderness, left CVA tenderness, guarding or rebound.     Comments: Generalized TTP worse in the lower quadrants. No guarding or rebound. No reproducible CVAT.   Skin:    General: Skin is warm.     Capillary Refill: Capillary refill takes less than 2 seconds.     Comments: Good skin turgor  Neurological:     General: No focal deficit present.     Mental Status: She is alert and oriented to person, place, and time.  Psychiatric:        Mood and Affect: Mood normal.        Behavior: Behavior normal.     UC Treatments / Results  Labs (all labs ordered are listed, but only abnormal results are displayed) Labs Reviewed  POCT URINALYSIS DIP (MANUAL ENTRY) - Abnormal; Notable for the following components:      Result Value   Blood, UA large (*)    Protein Ur, POC =30 (*)    Leukocytes, UA Small (1+) (*)    All other components within normal limits  URINE CULTURE    EKG   Radiology No results found.  Procedures Procedures (including critical care time)  Medications Ordered in UC Medications - No data to display  Initial Impression / Assessment and Plan / UC Course  I have reviewed the triage vital signs and the nursing  notes.  Pertinent labs & imaging results that were available during my care of the patient were reviewed by me and considered in my medical decision making (see chart for details).     This patient is a very pleasant 41 y.o. year old female presenting with severe abdominal pain, fevers. Febrile at 101.6 at time of visit. No guarding or rebound. She was treated for BV 10/17/21 with flagyl which she did not complete as directed. Today she denies vaginal or urinary symptoms but UA with large blood, small leuk. She has a history of tubo-ovarian abscess, diverticulitis; differential also includes pyelonephritis, nephrolithiasis.  Sent to ED via POV, she is in agreement.   Final Clinical Impressions(s) / UC Diagnoses   Final diagnoses:  Urinary symptom or sign     Discharge Instructions      Sent to ED via POV. Declines AVS.   ED Prescriptions   None    PDMP not reviewed this encounter.   Hazel Sams, PA-C 11/01/21 1146

## 2021-11-01 NOTE — ED Provider Triage Note (Signed)
Emergency Medicine Provider Triage Evaluation Note  Robyn [name] , a 41 y.o. female  was evaluated in triage.  Pt complains of lower abdominal pain.  Arrives from urgent care with fever.  Symptoms ongoing for the past week.  No vomiting.  Redirected here with fever and concern for need for imaging.  Review of Systems  Positive: Abdominal pain and fever Negative: CP or SOB  Physical Exam  BP 114/64   Pulse (!) 101   Temp (!) 100.6 F (38.1 C) (Oral)   Resp 18   Ht 5\' 8"  (1.727 m)   Wt 116.6 kg   LMP 10/28/2021   SpO2 94%   BMI 39.08 kg/m  Gen:   Awake, no distress   Resp:  Normal effort  MSK:   Moves extremities without difficulty  Other:  Mild tenderness throughout the abdomen worse in the lower abdomen.  Medical Decision Making  Medically screening exam initiated at 10:22 PM.  Appropriate orders placed.  Robyn [name] was informed that the remainder of the evaluation will be completed by another provider, this initial triage assessment does not replace that evaluation, and the importance of remaining in the ED until their evaluation is complete.  Overall well-appearing but low-grade fever with mild tachycardia.  I have ordered lab work and CT imaging.   Shelly Rubenstein, MD 11/01/21 2224

## 2021-11-02 ENCOUNTER — Emergency Department (HOSPITAL_BASED_OUTPATIENT_CLINIC_OR_DEPARTMENT_OTHER): Payer: BLUE CROSS/BLUE SHIELD

## 2021-11-02 DIAGNOSIS — N83202 Unspecified ovarian cyst, left side: Secondary | ICD-10-CM | POA: Diagnosis not present

## 2021-11-02 LAB — URINE CULTURE

## 2021-11-02 LAB — LIPASE, BLOOD: Lipase: 23 U/L (ref 11–51)

## 2021-11-02 MED ORDER — HYDROCODONE-ACETAMINOPHEN 5-325 MG PO TABS: 2.0000 | ORAL_TABLET | Freq: Four times a day (QID) | ORAL | 0 refills | Status: DC | PRN

## 2021-11-02 MED ORDER — AMOXICILLIN-POT CLAVULANATE 875-125 MG PO TABS
1.0000 | ORAL_TABLET | Freq: Once | ORAL | Status: AC
  Administered 2021-11-02: 1 via ORAL
  Filled 2021-11-02: qty 1

## 2021-11-02 MED ORDER — AMOXICILLIN-POT CLAVULANATE 875-125 MG PO TABS: 1.0000 | ORAL_TABLET | Freq: Two times a day (BID) | ORAL | 0 refills | Status: DC

## 2021-11-02 MED ORDER — FENTANYL CITRATE PF 50 MCG/ML IJ SOSY
100.0000 ug | PREFILLED_SYRINGE | Freq: Once | INTRAMUSCULAR | Status: AC
  Administered 2021-11-02: 100 ug via INTRAVENOUS
  Filled 2021-11-02: qty 2

## 2021-11-02 MED ORDER — HYDROCODONE-ACETAMINOPHEN 5-325 MG PO TABS
1.0000 | ORAL_TABLET | Freq: Once | ORAL | Status: AC
  Administered 2021-11-02: 1 via ORAL
  Filled 2021-11-02: qty 1

## 2021-11-02 NOTE — ED Notes (Signed)
Assessment: patient c/o lower back pain, suprapubic pain, and burning with urination. Patient rocking in bed prior to pain medication admin.  Patient visibly uncomfortable.

## 2021-11-03 LAB — URINE CULTURE

## 2021-11-14 ENCOUNTER — Ambulatory Visit: Payer: BLUE CROSS/BLUE SHIELD | Admitting: Orthopedic Surgery

## 2021-11-15 DIAGNOSIS — H918X3 Other specified hearing loss, bilateral: Secondary | ICD-10-CM | POA: Insufficient documentation

## 2021-11-15 DIAGNOSIS — H93A2 Pulsatile tinnitus, left ear: Secondary | ICD-10-CM | POA: Insufficient documentation

## 2021-11-16 ENCOUNTER — Other Ambulatory Visit: Payer: Self-pay | Admitting: Otolaryngology

## 2021-11-16 DIAGNOSIS — H903 Sensorineural hearing loss, bilateral: Secondary | ICD-10-CM

## 2021-11-16 DIAGNOSIS — H93A2 Pulsatile tinnitus, left ear: Secondary | ICD-10-CM

## 2021-11-27 ENCOUNTER — Ambulatory Visit
Admission: RE | Admit: 2021-11-27 | Discharge: 2021-11-27 | Disposition: A | Discharge status: Home / Self Care | Payer: BLUE CROSS/BLUE SHIELD | Source: Ambulatory Visit | Attending: Otolaryngology | Admitting: Otolaryngology

## 2021-11-27 DIAGNOSIS — H93A2 Pulsatile tinnitus, left ear: Secondary | ICD-10-CM

## 2021-11-27 DIAGNOSIS — H903 Sensorineural hearing loss, bilateral: Secondary | ICD-10-CM

## 2021-11-27 MED ORDER — GADOBENATE DIMEGLUMINE 529 MG/ML IV SOLN: 20.0000 mL | Freq: Once | INTRAVENOUS | Status: DC | PRN

## 2021-11-27 MED ORDER — GADOBENATE DIMEGLUMINE 529 MG/ML IV SOLN
20.0000 mL | Freq: Once | INTRAVENOUS | Status: AC | PRN
Start: 2021-11-27 — End: 2021-11-27
  Administered 2021-11-27: 20 mL via INTRAVENOUS

## 2022-03-22 ENCOUNTER — Emergency Department (HOSPITAL_BASED_OUTPATIENT_CLINIC_OR_DEPARTMENT_OTHER)
Admission: EM | Admit: 2022-03-22 | Discharge: 2022-03-22 | Disposition: A | Discharge status: Home / Self Care | Payer: BLUE CROSS/BLUE SHIELD | Attending: Emergency Medicine | Admitting: Emergency Medicine

## 2022-03-22 ENCOUNTER — Emergency Department (HOSPITAL_BASED_OUTPATIENT_CLINIC_OR_DEPARTMENT_OTHER): Payer: BLUE CROSS/BLUE SHIELD

## 2022-03-22 ENCOUNTER — Encounter (HOSPITAL_BASED_OUTPATIENT_CLINIC_OR_DEPARTMENT_OTHER): Payer: Self-pay | Admitting: Emergency Medicine

## 2022-03-22 ENCOUNTER — Other Ambulatory Visit: Payer: Self-pay

## 2022-03-22 DIAGNOSIS — M25462 Effusion, left knee: Secondary | ICD-10-CM | POA: Diagnosis not present

## 2022-03-22 DIAGNOSIS — Z79899 Other long term (current) drug therapy: Secondary | ICD-10-CM | POA: Diagnosis not present

## 2022-03-22 DIAGNOSIS — M25561 Pain in right knee: Secondary | ICD-10-CM | POA: Diagnosis present

## 2022-03-22 DIAGNOSIS — M25461 Effusion, right knee: Secondary | ICD-10-CM | POA: Diagnosis not present

## 2022-03-22 DIAGNOSIS — Z7984 Long term (current) use of oral hypoglycemic drugs: Secondary | ICD-10-CM | POA: Diagnosis not present

## 2022-03-22 MED ORDER — OXYCODONE HCL 5 MG PO TABS
5.0000 mg | ORAL_TABLET | Freq: Once | ORAL | Status: AC
  Administered 2022-03-22: 5 mg via ORAL
  Filled 2022-03-22: qty 1

## 2022-03-22 MED ORDER — ACETAMINOPHEN 500 MG PO TABS
1000.0000 mg | ORAL_TABLET | Freq: Once | ORAL | Status: AC
  Administered 2022-03-22: 1000 mg via ORAL
  Filled 2022-03-22: qty 2

## 2022-03-22 MED ORDER — KETOROLAC TROMETHAMINE 15 MG/ML IJ SOLN
15.0000 mg | Freq: Once | INTRAMUSCULAR | Status: AC
  Administered 2022-03-22: 15 mg via INTRAMUSCULAR
  Filled 2022-03-22: qty 1

## 2022-03-22 NOTE — Discharge Instructions (Signed)
Take 4 over the counter ibuprofen tablets 3 times a day or 2 over-the-counter naproxen tablets twice a day for pain. Also take tylenol 1000mg(2 extra strength) four times a day.    

## 2022-03-22 NOTE — ED Provider Notes (Signed)
[facility] EMERGENCY DEPARTMENT Provider Note   CSN: [id number] Arrival date & time: 03/22/22  1104     History  Chief Complaint  Patient presents with   Leg Pain    right    Robyn [name] is a 41 y.o. female.  41 yo F with a chief complaint of right knee pain.  This been an ongoing issue for her.  She initially filed through [employer] and is seeing orthopedist have an MRI and has had knee injections.  Feels like the pain is worsened over the past few days.  Feels like sometimes it locks up and she has trouble bending it.  She denies fevers or chills.  Denies trauma.   Leg Pain      Home Medications Prior to Admission medications   Medication Sig Start Date End Date Taking? Authorizing Provider  amoxicillin-clavulanate (AUGMENTIN) 875-125 MG tablet Take 1 tablet by mouth every 12 (twelve) hours. 11/02/21   Molpus, John, MD  HYDROcodone-acetaminophen (NORCO) 5-325 MG tablet Take 2 tablets by mouth every 6 (six) hours as needed for severe pain. 11/02/21   Molpus, John, MD  losartan-hydrochlorothiazide (HYZAAR) 50-12.5 MG tablet Take 1 tablet by mouth daily. 02/26/21   [provider]  metFORMIN (GLUCOPHAGE-XR) 500 MG 24 hr tablet Take 1,000 mg by mouth 2 (two) times daily. 12/23/18   [provider]      Allergies    Patient has no known allergies.    Review of Systems   Review of Systems  Physical Exam Updated Vital Signs BP (!) 143/80   Pulse 65   Temp 98.6 F (37 C) (Oral)   Resp 18   Ht 5' 7.5" (1.715 m)   Wt 122.5 kg   LMP 02/14/2022 (Exact Date)   SpO2 100%   BMI 41.66 kg/m  Physical Exam Vitals and nursing note reviewed.  Constitutional:      General: She is not in acute distress.    Appearance: She is well-developed. She is not diaphoretic.  HENT:     Head: Normocephalic and atraumatic.  Eyes:     Pupils: Pupils are equal, round, and reactive to light.  Cardiovascular:     Rate and Rhythm: Normal rate and regular  rhythm.     Heart sounds: No murmur heard.    No friction rub. No gallop.  Pulmonary:     Effort: Pulmonary effort is normal.     Breath sounds: No wheezing or rales.  Abdominal:     General: There is no distension.     Palpations: Abdomen is soft.     Tenderness: There is no abdominal tenderness.  Musculoskeletal:        General: No tenderness.     Cervical back: Normal range of motion and neck supple.     Comments: Mildly swollen right knee compared to the left.  Able to range without significant discomfort.  Negative McMurray's test.  She has some very minimal ligamentous laxity with posterior and medial distraction.  Pulse motor and sensation intact distally.  Skin:    General: Skin is warm and dry.  Neurological:     Mental Status: She is alert and oriented to person, place, and time.  Psychiatric:        Behavior: Behavior normal.     ED Results / Procedures / Treatments   Labs (all labs ordered are listed, but only abnormal results are displayed) Labs Reviewed - No data to display  EKG None  Radiology DG Knee  Complete 4 Views Right  Result Date: 03/22/2022 CLINICAL DATA:  Provided history: Edema. Additional history provided: Patient reports right leg and knee pain, injury 3 months ago. Right knee edema. EXAM: RIGHT KNEE - COMPLETE 4+ VIEW COMPARISON:  Radiographs of the right knee 12/22/2020. FINDINGS: There is normal bony alignment. No evidence of acute osseous or articular abnormality. No more than mild tibiofemoral compartment joint space narrowing. Minimal degenerative spurring along the medial and lateral tibiofemoral compartments. IMPRESSION: No evidence of acute osseous or  articular abnormality. Mild degenerative changes, as described. Electronically Signed   By: Jackey Loge D.O.   On: 03/22/2022 12:09    Procedures Procedures    Medications Ordered in ED Medications  acetaminophen (TYLENOL) tablet 1,000 mg (1,000 mg Oral Given 03/22/22 1252)  ketorolac  (TORADOL) 15 MG/ML injection 15 mg (15 mg Intramuscular Given 03/22/22 1252)  oxyCODONE (Oxy IR/ROXICODONE) immediate release tablet 5 mg (5 mg Oral Given 03/22/22 1251)    ED Course/ Medical Decision Making/ A&P                           Medical Decision Making Amount and/or Complexity of Data Reviewed Radiology: ordered.  Risk OTC drugs. Prescription drug management.   41 yo F with a chief complaints of right knee pain.  This is a chronic problem for her.  Been going on for some time now.  Is already seen orthopedics and had an MRI and knee injections.  No obvious repeat injury.  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  Plain film of the knee independently interpreted by me without fracture.  Offered a knee immobilizer which she is declining.  Will place in an Ace wrap.  Offered crutches which she is declining.  We will give her follow-up with sports medicine if she so chooses.  She told me she would likely try and go back to [employer].  1:28 PM:  I have discussed the diagnosis/risks/treatment options with the patient.  Evaluation and diagnostic testing in the emergency department does not suggest an emergent condition requiring admission or immediate intervention beyond what has been performed at this time.  They will follow up with PCP. We also discussed returning to the ED immediately if new or worsening sx occur. We discussed the sx which are most concerning (e.g., sudden worsening pain, fever, inability to tolerate by mouth) that necessitate immediate return. Medications administered to the patient during their visit and any new prescriptions provided to the patient are listed below.  Medications given during this visit Medications  acetaminophen (TYLENOL) tablet 1,000 mg (1,000 mg Oral Given 03/22/22 1252)  ketorolac (TORADOL) 15 MG/ML injection 15 mg (15 mg Intramuscular Given 03/22/22 1252)  oxyCODONE (Oxy IR/ROXICODONE) immediate release tablet 5 mg (5 mg Oral  Given 03/22/22 1251)     The patient appears reasonably screen and/or stabilized for discharge and I doubt any other medical condition or other [facility] requiring further screening, evaluation, or treatment in the ED at this time prior to discharge.          Final Clinical Impression(s) / ED Diagnoses Final diagnoses:  Acute pain of right knee    Rx / DC Orders ED Discharge Orders     None         Melene Plan, DO 03/22/22 1328

## 2022-03-22 NOTE — ED Notes (Signed)
Discharge instructions reviewed with patient. Patient verbalizes understanding, no further questions at this time. Medications and follow up information provided. No acute distress noted at time of departure.  

## 2022-03-22 NOTE — ED Triage Notes (Signed)
Reports right leg and knee pain , old injury 3 months ago . Reports heat feeling right upper thigh , also co right knee edema

## 2022-06-10 ENCOUNTER — Emergency Department (HOSPITAL_BASED_OUTPATIENT_CLINIC_OR_DEPARTMENT_OTHER)
Admission: EM | Admit: 2022-06-10 | Discharge: 2022-06-10 | Disposition: A | Discharge status: Home / Self Care | Payer: BLUE CROSS/BLUE SHIELD | Attending: Emergency Medicine | Admitting: Emergency Medicine

## 2022-06-10 ENCOUNTER — Other Ambulatory Visit: Payer: Self-pay

## 2022-06-10 ENCOUNTER — Encounter (HOSPITAL_BASED_OUTPATIENT_CLINIC_OR_DEPARTMENT_OTHER): Payer: Self-pay | Admitting: Emergency Medicine

## 2022-06-10 ENCOUNTER — Emergency Department (HOSPITAL_BASED_OUTPATIENT_CLINIC_OR_DEPARTMENT_OTHER): Payer: BLUE CROSS/BLUE SHIELD

## 2022-06-10 DIAGNOSIS — E119 Type 2 diabetes mellitus without complications: Secondary | ICD-10-CM | POA: Diagnosis not present

## 2022-06-10 DIAGNOSIS — Z79899 Other long term (current) drug therapy: Secondary | ICD-10-CM | POA: Diagnosis not present

## 2022-06-10 DIAGNOSIS — Y990 Civilian activity done for income or pay: Secondary | ICD-10-CM | POA: Diagnosis not present

## 2022-06-10 DIAGNOSIS — Z7984 Long term (current) use of oral hypoglycemic drugs: Secondary | ICD-10-CM | POA: Insufficient documentation

## 2022-06-10 DIAGNOSIS — I10 Essential (primary) hypertension: Secondary | ICD-10-CM | POA: Insufficient documentation

## 2022-06-10 DIAGNOSIS — M25561 Pain in right knee: Secondary | ICD-10-CM | POA: Insufficient documentation

## 2022-06-10 DIAGNOSIS — X501XXA Overexertion from prolonged static or awkward postures, initial encounter: Secondary | ICD-10-CM | POA: Insufficient documentation

## 2022-06-10 MED ORDER — KETOROLAC TROMETHAMINE 60 MG/2ML IM SOLN
60.0000 mg | Freq: Once | INTRAMUSCULAR | Status: AC
  Administered 2022-06-10: 60 mg via INTRAMUSCULAR
  Filled 2022-06-10: qty 2

## 2022-06-10 MED ORDER — OXYCODONE HCL 5 MG PO TABS
5.0000 mg | ORAL_TABLET | Freq: Once | ORAL | Status: AC
  Administered 2022-06-10: 5 mg via ORAL
  Filled 2022-06-10: qty 1

## 2022-06-10 MED ORDER — NAPROXEN 375 MG PO TABS: 375.0000 mg | ORAL_TABLET | Freq: Two times a day (BID) | ORAL | 0 refills | Status: DC

## 2022-06-10 NOTE — ED Provider Notes (Signed)
[facility] EMERGENCY DEPARTMENT Provider Note   CSN: [id number] Arrival date & time: 06/10/22  0125     History  Chief Complaint  Patient presents with   Knee Pain    Robyn [name] is a 42 y.o. female.  The history is provided by the patient.  Knee Pain Robyn [name] is a 42 y.o. female who presents to the Emergency Department complaining of knee pain.  She presents to the emergency department complaining of right knee pain for the last week.  Pain is located in the anterior knee as well as in the popliteal fossa.  She did have a injury several months ago but no recent injuries.  She has been taking naproxen, Tylenol and ibuprofen at home with no significant improvement in pain.  The pain keeps her up at night.  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  She is scheduled to follow-up with [facility] next week. No fevers, chest pain, shortness of breath, nausea, vomiting.   Hx/o DM, HTN.  No hx/o CKD, DVT/PE.    Naproxen, tylenol, ibuprofen.      Home Medications Prior to Admission medications   Medication Sig Start Date End Date Taking? Authorizing Provider  naproxen (NAPROSYN) 375 MG tablet Take 1 tablet (375 mg total) by mouth 2 (two) times daily with a meal. 06/10/22  Yes Quintella Reichert, MD  amoxicillin-clavulanate (AUGMENTIN) 875-125 MG tablet Take 1 tablet by mouth every 12 (twelve) hours. 11/02/21   Molpus, John, MD  HYDROcodone-acetaminophen (NORCO) 5-325 MG tablet Take 2 tablets by mouth every 6 (six) hours as needed for severe pain. 11/02/21   Molpus, John, MD  losartan-hydrochlorothiazide (HYZAAR) 50-12.5 MG tablet Take 1 tablet by mouth daily. 02/26/21   [provider]  metFORMIN (GLUCOPHAGE-XR) 500 MG 24 hr tablet Take 1,000 mg by mouth 2 (two) times daily. 12/23/18   [provider]      Allergies    Patient has no known allergies.    Review of Systems   Review of Systems  All other systems  reviewed and are negative.   Physical Exam Updated Vital Signs BP (!) 169/96   Pulse 74   Temp 97.8 F (36.6 C) (Oral)   Resp 18   Ht 5\' 8"  (1.727 m)   Wt 121.6 kg   LMP 05/21/2022 (Exact Date)   SpO2 100%   BMI 40.75 kg/m  Physical Exam Vitals and nursing note reviewed.  Constitutional:      Appearance: She is well-developed.  HENT:     Head: Normocephalic and atraumatic.  Cardiovascular:     Rate and Rhythm: Normal rate and regular rhythm.  Pulmonary:     Effort: Pulmonary effort is normal. No respiratory distress.  Musculoskeletal:     Comments: 2+ DP pulses bilaterally.  There is mild tenderness to palpation over the right anterior knee and popliteal fossa.  There is no significant joint effusion or erythema.  She does have pain on flexion extension of the knee but she is able to range the knee.  Skin:    General: Skin is warm and dry.  Neurological:     Mental Status: She is alert and oriented to person, place, and time.  Psychiatric:        Behavior: Behavior normal.     ED Results / Procedures / Treatments   Labs (all labs ordered are listed, but only abnormal results are displayed) Labs Reviewed - No data to  display  EKG None  Radiology DG Knee Complete 4 Views Right  Result Date: 06/10/2022 CLINICAL DATA:  Right knee pain for several months, no known injury, initial encounter EXAM: RIGHT KNEE - COMPLETE 4+ VIEW COMPARISON:  03/22/2022 FINDINGS: Tricompartmental degenerative changes are noted. No joint effusion is seen. No acute fracture or dislocation is noted. IMPRESSION: Degenerative change without acute abnormality. Electronically Signed   By: Inez Catalina M.D.   On: 06/10/2022 02:38    Procedures Procedures    Medications Ordered in ED Medications  ketorolac (TORADOL) injection 60 mg (60 mg Intramuscular Given 06/10/22 0526)  oxyCODONE (Oxy IR/ROXICODONE) immediate release tablet 5 mg (5 mg Oral Given 06/10/22 5465)    ED Course/ Medical Decision  Making/ A&P                             Medical Decision Making Amount and/or Complexity of Data Reviewed Radiology: ordered.  Risk Prescription drug management.   Patient here for evaluation of atraumatic right knee pain.  Minges are negative for acute fracture or dislocation-images personally reviewed and interpreted, agree with radiologist interpretation.  She does have findings of degenerative change.  Current clinical picture is not consistent with septic or gouty arthritis, overlying cellulitis, DVT.  Discussed with patient home care for knee pain.  She does have a sleeve at home that I recommend she start using.  Discussed rest at home.  Will prescribe naproxen.  Discussed continuing scheduled acetaminophen at home.  Discussed orthopedics follow-up and return precautions.        Final Clinical Impression(s) / ED Diagnoses Final diagnoses:  Acute pain of right knee    Rx / DC Orders ED Discharge Orders          Ordered    naproxen (NAPROSYN) 375 MG tablet  2 times daily with meals        06/10/22 0505              Quintella Reichert, MD 06/10/22 [phone]

## 2022-06-10 NOTE — ED Triage Notes (Signed)
Pt is c/o right knee pain x 2 weeks  Pt states she had an old injury from the summer to that knee but nothing recent

## 2023-03-14 ENCOUNTER — Other Ambulatory Visit: Payer: Self-pay

## 2023-03-14 ENCOUNTER — Emergency Department (HOSPITAL_BASED_OUTPATIENT_CLINIC_OR_DEPARTMENT_OTHER): Payer: BLUE CROSS/BLUE SHIELD

## 2023-03-14 ENCOUNTER — Encounter (HOSPITAL_BASED_OUTPATIENT_CLINIC_OR_DEPARTMENT_OTHER): Payer: Self-pay | Admitting: Emergency Medicine

## 2023-03-14 ENCOUNTER — Emergency Department (HOSPITAL_BASED_OUTPATIENT_CLINIC_OR_DEPARTMENT_OTHER)
Admission: EM | Admit: 2023-03-14 | Discharge: 2023-03-15 | Disposition: A | Discharge status: Home / Self Care | Payer: BLUE CROSS/BLUE SHIELD | Attending: Emergency Medicine | Admitting: Emergency Medicine

## 2023-03-14 DIAGNOSIS — E119 Type 2 diabetes mellitus without complications: Secondary | ICD-10-CM | POA: Insufficient documentation

## 2023-03-14 DIAGNOSIS — R519 Headache, unspecified: Secondary | ICD-10-CM | POA: Diagnosis present

## 2023-03-14 DIAGNOSIS — F17200 Nicotine dependence, unspecified, uncomplicated: Secondary | ICD-10-CM | POA: Insufficient documentation

## 2023-03-14 DIAGNOSIS — I10 Essential (primary) hypertension: Secondary | ICD-10-CM | POA: Diagnosis not present

## 2023-03-14 DIAGNOSIS — R030 Elevated blood-pressure reading, without diagnosis of hypertension: Secondary | ICD-10-CM

## 2023-03-14 LAB — CBC WITH DIFFERENTIAL/PLATELET
Abs Immature Granulocytes: 0.02 10*3/uL (ref 0.00–0.07)
Basophils Absolute: 0.1 10*3/uL (ref 0.0–0.1)
Basophils Relative: 1 %
Eosinophils Absolute: 0.1 10*3/uL (ref 0.0–0.5)
Eosinophils Relative: 1 %
HCT: 37.4 % (ref 36.0–46.0)
Hemoglobin: 12.3 g/dL (ref 12.0–15.0)
Immature Granulocytes: 0 %
Lymphocytes Relative: 38 %
Lymphs Abs: 3.3 10*3/uL (ref 0.7–4.0)
MCH: 27.6 pg (ref 26.0–34.0)
MCHC: 32.9 g/dL (ref 30.0–36.0)
MCV: 84 fL (ref 80.0–100.0)
Monocytes Absolute: 0.6 10*3/uL (ref 0.1–1.0)
Monocytes Relative: 6 %
Neutro Abs: 4.8 10*3/uL (ref 1.7–7.7)
Neutrophils Relative %: 54 %
Platelets: 240 10*3/uL (ref 150–400)
RBC: 4.45 MIL/uL (ref 3.87–5.11)
RDW: 14.3 % (ref 11.5–15.5)
WBC: 8.8 10*3/uL (ref 4.0–10.5)
nRBC: 0 % (ref 0.0–0.2)

## 2023-03-14 LAB — COMPREHENSIVE METABOLIC PANEL
ALT: 17 U/L (ref 0–44)
AST: 15 U/L (ref 15–41)
Albumin: 4 g/dL (ref 3.5–5.0)
Alkaline Phosphatase: 63 U/L (ref 38–126)
Anion gap: 15 (ref 5–15)
BUN: 11 mg/dL (ref 6–20)
CO2: 25 mmol/L (ref 22–32)
Calcium: 9.6 mg/dL (ref 8.9–10.3)
Chloride: 101 mmol/L (ref 98–111)
Creatinine, Ser: 0.73 mg/dL (ref 0.44–1.00)
GFR, Estimated: >60 mL/min (ref >60)
Glucose, Bld: 111 mg/dL — ABNORMAL HIGH (ref 70–99)
Potassium: 3.5 mmol/L (ref 3.5–5.1)
Sodium: 141 mmol/L (ref 135–145)
Total Bilirubin: 0.4 mg/dL (ref 0.3–1.2)
Total Protein: 7.4 g/dL (ref 6.5–8.1)

## 2023-03-14 LAB — URINALYSIS, ROUTINE W REFLEX MICROSCOPIC
Bilirubin Urine: NEGATIVE
Glucose, UA: NEGATIVE mg/dL
Ketones, ur: NEGATIVE mg/dL
Leukocytes,Ua: NEGATIVE
Nitrite: NEGATIVE
Protein, ur: NEGATIVE mg/dL
Specific Gravity, Urine: 1.025 (ref 1.005–1.030)
pH: 7 (ref 5.0–8.0)

## 2023-03-14 LAB — URINALYSIS, MICROSCOPIC (REFLEX): WBC, UA: NONE SEEN WBC/hpf (ref 0–5)

## 2023-03-14 LAB — TROPONIN I (HIGH SENSITIVITY): Troponin I (High Sensitivity): 3 ng/L (ref <18)

## 2023-03-14 MED ORDER — LOSARTAN POTASSIUM 25 MG PO TABS: 50.0000 mg | ORAL_TABLET | Freq: Every day | ORAL | Status: DC

## 2023-03-14 MED ORDER — LOSARTAN POTASSIUM-HCTZ 50-12.5 MG PO TABS: 1.0000 | ORAL_TABLET | Freq: Every day | ORAL | 0 refills | Status: DC

## 2023-03-14 MED ORDER — KETOROLAC TROMETHAMINE 30 MG/ML IJ SOLN
30.0000 mg | Freq: Once | INTRAMUSCULAR | Status: AC
  Administered 2023-03-14: 30 mg via INTRAVENOUS
  Filled 2023-03-14: qty 1

## 2023-03-14 MED ORDER — HYDROCHLOROTHIAZIDE 25 MG PO TABS
25.0000 mg | ORAL_TABLET | Freq: Once | ORAL | Status: AC
  Administered 2023-03-14: 25 mg via ORAL
  Filled 2023-03-14: qty 1

## 2023-03-14 NOTE — ED Triage Notes (Signed)
Pt c/o HA and elevated BP; sts she has been without meds x 1 wk

## 2023-03-15 NOTE — ED Provider Notes (Signed)
Emergency Department Provider Note   I have reviewed the triage vital signs and the nursing notes.   HISTORY  Chief Complaint Headache   HPI Robyn [name] is a 42 y.o. female past history reviewed below presents emergency department with frontal headache for the blood pressures.  She denies any vision changes sudden/severe headache symptoms. No CP or SOB.  She has been out of her blood pressure medication for the last week.    Past Medical History:  Diagnosis Date   Anxiety    h/o panic attacks   Diabetes mellitus without complication (HCC)    diet controlled    Diverticulosis 02/16/2019   Herpes    Hypertension    Left ovarian cyst 11/29/2011   PCOS (polycystic ovarian syndrome)    Pregnancy induced hypertension    Shortness of breath dyspnea    with exertion and while pregnant   Smoker 11/29/2011    Review of Systems  Constitutional: No fever/chills Cardiovascular: Denies chest pain. Respiratory: Denies shortness of breath. Gastrointestinal: No abdominal pain.  No nausea, no vomiting.   Musculoskeletal: Negative for back pain. Skin: Negative for rash. Neurological: Positive HA.    ____________________________________________   PHYSICAL EXAM:  VITAL SIGNS: ED Triage Vitals  Encounter Vitals Group     BP 03/14/23 2011 (!) 205/94     Pulse Rate 03/14/23 2011 73     Resp 03/14/23 2011 18     Temp 03/14/23 2011 98 F (36.7 C)     Temp src --      SpO2 03/14/23 2011 97 %     Weight 03/14/23 2009 263 lb (119.3 kg)     Height 03/14/23 2009 5\' 8"  (1.727 m)   Constitutional: Alert and oriented. Well appearing and in no acute distress. Eyes: Conjunctivae are normal.  Head: Atraumatic. Nose: No congestion/rhinnorhea. Mouth/Throat: Mucous membranes are moist.   Neck: No stridor.  No meningeal signs.  Cardiovascular: Normal rate, regular rhythm. Good peripheral circulation. Grossly normal heart sounds.   Respiratory: Normal respiratory effort.  No  retractions. Lungs CTAB. Gastrointestinal: Soft and nontender. No distention.  Musculoskeletal: No lower extremity tenderness nor edema. No gross deformities of extremities. Neurologic:  Normal speech and language. No gross focal neurologic deficits are appreciated.  Skin:  Skin is warm, dry and intact. No rash noted.   ____________________________________________   LABS (all labs ordered are listed, but only abnormal results are displayed)  Labs Reviewed  COMPREHENSIVE METABOLIC PANEL - Abnormal; Notable for the following components:      Result Value   Glucose, Bld 111 (*)    All other components within normal limits  URINALYSIS, ROUTINE W REFLEX MICROSCOPIC - Abnormal; Notable for the following components:   Hgb urine dipstick LARGE (*)    All other components within normal limits  URINALYSIS, MICROSCOPIC (REFLEX) - Abnormal; Notable for the following components:   Bacteria, UA RARE (*)    All other components within normal limits  CBC WITH DIFFERENTIAL/PLATELET  TROPONIN I (HIGH SENSITIVITY)  TROPONIN I (HIGH SENSITIVITY)   ____________________________________________  EKG   EKG Interpretation Date/Time:  Wednesday March 14 2023 20:14:28 EDT Ventricular Rate:  75 PR Interval:  180 QRS Duration:  97 QT Interval:  385 QTC Calculation: 430 R Axis:   71  Text Interpretation: Sinus rhythm Low voltage, precordial leads Borderline T abnormalities, lateral leads duplicate Confirmed by Arby Barrette [id number]) on 03/14/2023 9:28:35 PM        ____________________________________________  RADIOLOGY  CT Head Wo Contrast  Result Date: 03/15/2023 CLINICAL DATA:  Frequent headache EXAM: CT HEAD WITHOUT CONTRAST TECHNIQUE: Contiguous axial images were obtained from the base of the skull through the vertex without intravenous contrast. RADIATION DOSE REDUCTION: This exam was performed according to the departmental dose-optimization program which includes automated exposure  control, adjustment of the mA and/or kV according to patient size and/or use of iterative reconstruction technique. COMPARISON:  None Available. FINDINGS: Brain: No evidence of acute infarction, hemorrhage, hydrocephalus, extra-axial collection or mass lesion/mass effect. Vascular: No hyperdense vessel or unexpected calcification. Skull: Normal. Negative for fracture or focal lesion. Sinuses/Orbits: No acute finding. Other: None. IMPRESSION: Negative non contrasted CT appearance of the brain. Electronically Signed   By: Jasmine Pang M.D.   On: 03/15/2023 00:09    ____________________________________________   PROCEDURES  Procedure(s) performed:   Procedures  None ____________________________________________   INITIAL IMPRESSION / ASSESSMENT AND PLAN / ED COURSE  Pertinent labs & imaging results that were available during my care of the patient were reviewed by me and considered in my medical decision making (see chart for details).   This patient is Presenting for Evaluation of HA, which does require a range of treatment options, and is a complaint that involves a high risk of morbidity and mortality.  The Differential Diagnoses includes but is not exclusive to subarachnoid hemorrhage, meningitis, encephalitis, previous head trauma, cavernous venous thrombosis, muscle tension headache, glaucoma, temporal arteritis, migraine or migraine equivalent, etc.   Critical Interventions-    Medications  losartan (COZAAR) tablet 50 mg (has no administration in time range)  hydrochlorothiazide (HYDRODIURIL) tablet 25 mg (25 mg Oral Given 03/14/23 2348)  ketorolac (TORADOL) 30 MG/ML injection 30 mg (30 mg Intravenous Given 03/14/23 2349)    Reassessment after intervention: HA pain improved.    Clinical Laboratory Tests Ordered, included troponin negative.  No acute kidney injury.  No anemia.  Radiologic Tests Ordered, included CT head. I independently interpreted the images and agree with  radiology interpretation.   Cardiac Monitor Tracing which shows NSR.    Social Determinants of Health Risk patient is a smoker.   Medical Decision Making: Summary:  Patient presents emergency department for evaluation of headache and elevated blood pressure.  No evidence of endorgan damage related to blood pressure on my initial assessment or by history.  Reevaluation with update and discussion with patient.  Plan to restart home blood pressure medications.  90-day supply provided and encouraged to follow with the primary doctor.   Considered admission but ED workup is reassuring and patient is stable for discharge.   Patient's presentation is most consistent with acute presentation with potential threat to life or bodily function.   Disposition: discharge  ____________________________________________  FINAL CLINICAL IMPRESSION(S) / ED DIAGNOSES  Final diagnoses:  Elevated blood pressure reading  Uncontrolled hypertension    Note:  This document was prepared using Dragon voice recognition software and may include unintentional dictation errors.  Alona Bene, MD, [facility]    Quinntin Malter, Arlyss Repress, MD 03/15/23 [phone]

## 2023-03-15 NOTE — Discharge Instructions (Signed)
You have been seen in the Emergency Department (ED) for a headache.  Please use Tylenol or Motrin as needed for symptoms, but only as written on the box.  As we have discussed, please follow up with your primary care doctor as soon as possible regarding today?s Emergency Department (ED) visit and your headache symptoms.    Call your doctor or return to the ED if you have a worsening headache, sudden and severe headache, confusion, slurred speech, facial droop, weakness or numbness in any arm or leg, extreme fatigue, vision problems, or other symptoms that concern you.

## 2023-03-26 ENCOUNTER — Ambulatory Visit
Admission: EM | Admit: 2023-03-26 | Discharge: 2023-03-26 | Disposition: A | Discharge status: Home / Self Care | Payer: BLUE CROSS/BLUE SHIELD | Attending: Internal Medicine | Admitting: Internal Medicine

## 2023-03-26 ENCOUNTER — Other Ambulatory Visit: Payer: Self-pay

## 2023-03-26 ENCOUNTER — Ambulatory Visit: Payer: BLUE CROSS/BLUE SHIELD

## 2023-03-26 ENCOUNTER — Encounter: Payer: Self-pay | Admitting: Emergency Medicine

## 2023-03-26 DIAGNOSIS — M25561 Pain in right knee: Secondary | ICD-10-CM

## 2023-03-26 MED ORDER — PREDNISONE 10 MG PO TABS: ORAL_TABLET | ORAL | 0 refills | Status: AC

## 2023-03-26 MED ORDER — DEXAMETHASONE SODIUM PHOSPHATE 10 MG/ML IJ SOLN
10.0000 mg | Freq: Once | INTRAMUSCULAR | Status: AC
  Administered 2023-03-26: 10 mg via INTRAMUSCULAR

## 2023-03-26 MED ORDER — KETOROLAC TROMETHAMINE 30 MG/ML IJ SOLN
30.0000 mg | Freq: Once | INTRAMUSCULAR | Status: AC
  Administered 2023-03-26: 30 mg via INTRAMUSCULAR

## 2023-03-26 NOTE — Discharge Instructions (Addendum)
Right knee pain.  X-ray done today and on brief review there does not appear to be any fractures present.  The radiologist will give the final results later today.  Toradol injection as well as steroid injection given today to help with pain.  Will send home on prednisone taper starting with 50 mg tomorrow morning then 40 mg then 30mg  then 20mg  then 10mg .  Continue to use ice to help with swelling.  Use brace when up walking.  Recommend following up with orthopedist for further evaluation.  Return to urgent care or PCP if symptoms worsen or fail to resolve.

## 2023-03-26 NOTE — ED Triage Notes (Addendum)
Right knee pain started Friday morning.  Patient was walking on uneven ground, stepped awkwardly.  From that point on, pain occurred with walking.  Patient did not fall to the ground  Patient has been taken ibuprofen with no relief.  Patient reports swelling and pain radiating down anterior lower leg  With straightening of right leg, feels pain radiating down front of leg and occasionally feels pain in back of knee

## 2023-03-26 NOTE — ED Provider Notes (Signed)
[facility]    CSN: [id number] Arrival date & time: 03/26/23  1316      History   Chief Complaint Chief Complaint  Patient presents with   Knee Pain    HPI Robyn [name] is a 42 y.o. female.   42 yr old female who presents to urgent care with right knee that started after walking Friday on uneven ground. Denies fall but her knee buckled due to the pain. Now can't get the knee to straighten out without severe pain and locking. It is swelling as well. Has had injury to the right knee in the past and was seeing ortho but its been a while ago and she was unable to follow up due to loss of her mother. Currently using ibuprofen and icing the knee. 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    Knee Pain Associated symptoms: no back pain and no fever     Past Medical History:  Diagnosis Date   Anxiety    h/o panic attacks   Diabetes mellitus without complication (HCC)    diet controlled    Diverticulosis 02/16/2019   Herpes    Hypertension    Left ovarian cyst 11/29/2011   PCOS (polycystic ovarian syndrome)    Pregnancy induced hypertension    Shortness of breath dyspnea    with exertion and while pregnant   Smoker 11/29/2011    Patient Active Problem List   Diagnosis Date Noted   Tubo-ovarian abscess 08/06/2019   TOA (tubo-ovarian abscess) 08/05/2019   Colonic diverticular abscess 02/10/2019   Diverticulitis 02/09/2019   S/P repeat low transverse C-section 03/23/2015   Hypertension - on Labetalol 200 mg po bid 11/22/2014   Diabetes mellitus type 2, uncontrolled, without complications -- dx'd in 2011 11/22/2014   Hx of incompetent cervix, currently pregnant - cerclage placed on 10/09/14 by Dr. Su Hilt 11/22/2014   History of preterm delivery x 2 - weekly 17P injections - first injection at 16.5 wks 11/22/2014   Elderly multigravida 11/22/2014   PCOS (polycystic ovarian syndrome) 08/21/2014   Herpes 08/21/2014   ANA positive 08/21/2014    Previous cesarean delivery, antepartum condition or complication x 2--previous vertical incision 08/21/2014   Obesity 05/20/2012    Past Surgical History:  Procedure Laterality Date   CERVICAL CERCLAGE N/A 10/09/2014   Procedure: CERCLAGE CERVICAL;  Surgeon: Osborn Coho, MD;  Location: [facility];  Service: Gynecology;  Laterality: N/A;   CESAREAN SECTION     C/S x 2   CESAREAN SECTION WITH BILATERAL TUBAL LIGATION Bilateral 03/23/2015   Procedure: CESAREAN SECTION WITH BILATERAL TUBAL LIGATION;  Surgeon: Osborn Coho, MD;  Location: [facility];  Service: Obstetrics;  Laterality: Bilateral;  Amt OK'd to move 02/25/2015   FLEXIBLE SIGMOIDOSCOPY N/A 05/08/2019   Procedure: FLEXIBLE SIGMOIDOSCOPY;  Surgeon: Romie Levee, MD;  Location: [facility];  Service: Endoscopy;  Laterality: N/A;   IR RADIOLOGIST EVAL & MGMT  02/20/2019   IR RADIOLOGIST EVAL & MGMT  03/06/2019   IR RADIOLOGIST EVAL & MGMT  08/21/2019   WISDOM TOOTH EXTRACTION      OB History     Gravida  5   Para  3   Term      Preterm  3   AB  2   Living  3      SAB  1   IAB  1   Ectopic      Multiple  0   Live Births  3  Home Medications    Prior to Admission medications   Medication Sig Start Date End Date Taking? Authorizing Provider  amoxicillin-clavulanate (AUGMENTIN) 875-125 MG tablet Take 1 tablet by mouth every 12 (twelve) hours. Patient not taking: Reported on 03/26/2023 11/02/21   Molpus, Jonny Ruiz, MD  HYDROcodone-acetaminophen (NORCO) 5-325 MG tablet Take 2 tablets by mouth every 6 (six) hours as needed for severe pain. Patient not taking: Reported on 03/26/2023 11/02/21   Molpus, John, MD  losartan-hydrochlorothiazide (HYZAAR) 50-12.5 MG tablet Take 1 tablet by mouth daily. 03/14/23   Long, Arlyss Repress, MD  metFORMIN (GLUCOPHAGE-XR) 500 MG 24 hr tablet Take 1,000 mg by mouth 2 (two) times daily. Patient not taking: Reported on 03/26/2023 12/23/18   [provider]  naproxen (NAPROSYN)  375 MG tablet Take 1 tablet (375 mg total) by mouth 2 (two) times daily with a meal. Patient not taking: Reported on 03/26/2023 06/10/22   Tilden Fossa, MD    Family History Family History  Problem Relation Age of Onset   Hypertension Mother    Diabetes Father    Stroke Father    Cancer Maternal Grandmother    Cancer Maternal Uncle     Social History Social History   Tobacco Use   Smoking status: Every Day    Current packs/day: 0.50    Average packs/day: 0.5 packs/day for 13.0 years (6.5 ttl pk-yrs)    Types: Cigarettes   Smokeless tobacco: Never  Vaping Use   Vaping status: Never Used  Substance Use Topics   Alcohol use: Yes    Comment: socially and rarely   Drug use: No     Allergies   Patient has no known allergies.   Review of Systems Review of Systems  Constitutional:  Negative for chills and fever.  HENT:  Negative for ear pain and sore throat.   Eyes:  Negative for pain and visual disturbance.  Respiratory:  Negative for cough and shortness of breath.   Cardiovascular:  Negative for chest pain and palpitations.  Gastrointestinal:  Negative for abdominal pain and vomiting.  Genitourinary:  Negative for dysuria and hematuria.  Musculoskeletal:  Positive for joint swelling. Negative for arthralgias and back pain.       Right knee pain with locking  Skin:  Negative for color change and rash.  Neurological:  Negative for seizures and syncope.  All other systems reviewed and are negative.    Physical Exam Triage Vital Signs ED Triage Vitals  Encounter Vitals Group     BP 03/26/23 1344 (!) 151/89     Systolic BP Percentile --      Diastolic BP Percentile --      Pulse Rate 03/26/23 1344 89     Resp 03/26/23 1344 20     Temp 03/26/23 1344 98.2 F (36.8 C)     Temp Source 03/26/23 1344 Oral     SpO2 03/26/23 1344 96 %     Weight --      Height --      Head Circumference --      Peak Flow --      Pain Score 03/26/23 1339 10     Pain Loc --       Pain Education --      Exclude from Growth Chart --    No data found.  Updated Vital Signs BP (!) 151/89 (BP Location: Left Arm) Comment (BP Location): large cuff  Pulse 89   Temp 98.2 F (36.8 C) (Oral)   Resp  20   LMP 03/09/2023 (Approximate)   SpO2 96%   Visual Acuity Right Eye Distance:   Left Eye Distance:   Bilateral Distance:    Right Eye Near:   Left Eye Near:    Bilateral Near:     Physical Exam Vitals and nursing note reviewed.  Constitutional:      General: She is not in acute distress.    Appearance: She is well-developed.  HENT:     Head: Normocephalic and atraumatic.  Eyes:     Conjunctiva/sclera: Conjunctivae normal.  Cardiovascular:     Rate and Rhythm: Normal rate and regular rhythm.     Heart sounds: No murmur heard. Pulmonary:     Effort: Pulmonary effort is normal. No respiratory distress.     Breath sounds: Normal breath sounds.  Abdominal:     Palpations: Abdomen is soft.     Tenderness: There is no abdominal tenderness.  Musculoskeletal:        General: No swelling.     Cervical back: Neck supple.     Right knee: No deformity, effusion, erythema, ecchymosis, lacerations or crepitus. Decreased range of motion. Tenderness present over the medial joint line and lateral joint line. Normal pulse.     Instability Tests: Anterior drawer test negative. Posterior drawer test negative. Medial McMurray test positive and lateral McMurray test positive.  Skin:    General: Skin is warm and dry.     Capillary Refill: Capillary refill takes less than 2 seconds.  Neurological:     Mental Status: She is alert.  Psychiatric:        Mood and Affect: Mood normal.      UC Treatments / Results  Labs (all labs ordered are listed, but only abnormal results are displayed) Labs Reviewed - No data to display  EKG   Radiology No results found.  Procedures Procedures (including critical care time)  Medications Ordered in UC Medications - No data to  display  Initial Impression / Assessment and Plan / UC Course  I have reviewed the triage vital signs and the nursing notes.  Pertinent labs & imaging results that were available during my care of the patient were reviewed by me and considered in my medical decision making (see chart for details).     Acute pain of right knee    Right knee pain.  X-ray done today and on brief review there does not appear to be any fractures present.  The radiologist will give the final results later today.  Toradol injection as well as steroid injection given today to help with pain.  Will send home on prednisone taper starting with 50 mg tomorrow morning then 40 mg then 30mg  then 20mg  then 10mg .  Continue to use ice to help with swelling.  Use brace when up walking.  Recommend following up with orthopedist for further evaluation.  Final Clinical Impressions(s) / UC Diagnoses   Final diagnoses:  None   Discharge Instructions   None    ED Prescriptions   None    PDMP not reviewed this encounter.   Landis Martins, [place] 03/26/23 1433

## 2023-04-11 ENCOUNTER — Other Ambulatory Visit: Payer: Self-pay

## 2023-04-11 ENCOUNTER — Emergency Department (HOSPITAL_COMMUNITY): Payer: BLUE CROSS/BLUE SHIELD

## 2023-04-11 ENCOUNTER — Encounter (HOSPITAL_COMMUNITY): Payer: Self-pay

## 2023-04-11 ENCOUNTER — Emergency Department (HOSPITAL_COMMUNITY)
Admission: EM | Admit: 2023-04-11 | Discharge: 2023-04-11 | Disposition: A | Discharge status: Home / Self Care | Payer: BLUE CROSS/BLUE SHIELD | Attending: Emergency Medicine | Admitting: Emergency Medicine

## 2023-04-11 DIAGNOSIS — R519 Headache, unspecified: Secondary | ICD-10-CM | POA: Diagnosis present

## 2023-04-11 DIAGNOSIS — I158 Other secondary hypertension: Secondary | ICD-10-CM | POA: Insufficient documentation

## 2023-04-11 DIAGNOSIS — I10 Essential (primary) hypertension: Secondary | ICD-10-CM | POA: Diagnosis not present

## 2023-04-11 DIAGNOSIS — G44219 Episodic tension-type headache, not intractable: Secondary | ICD-10-CM | POA: Insufficient documentation

## 2023-04-11 DIAGNOSIS — F17200 Nicotine dependence, unspecified, uncomplicated: Secondary | ICD-10-CM | POA: Diagnosis not present

## 2023-04-11 DIAGNOSIS — E119 Type 2 diabetes mellitus without complications: Secondary | ICD-10-CM | POA: Diagnosis not present

## 2023-04-11 DIAGNOSIS — Z79899 Other long term (current) drug therapy: Secondary | ICD-10-CM | POA: Diagnosis not present

## 2023-04-11 LAB — BASIC METABOLIC PANEL
Anion gap: 12 (ref 5–15)
BUN: 13 mg/dL (ref 6–20)
CO2: 24 mmol/L (ref 22–32)
Calcium: 10.3 mg/dL (ref 8.9–10.3)
Chloride: 99 mmol/L (ref 98–111)
Creatinine, Ser: 0.65 mg/dL (ref 0.44–1.00)
GFR, Estimated: >60 mL/min (ref >60)
Glucose, Bld: 148 mg/dL — ABNORMAL HIGH (ref 70–99)
Potassium: 3.5 mmol/L (ref 3.5–5.1)
Sodium: 135 mmol/L (ref 135–145)

## 2023-04-11 LAB — CBC
HCT: 41.7 % (ref 36.0–46.0)
Hemoglobin: 13.8 g/dL (ref 12.0–15.0)
MCH: 28.3 pg (ref 26.0–34.0)
MCHC: 33.1 g/dL (ref 30.0–36.0)
MCV: 85.5 fL (ref 80.0–100.0)
Platelets: 235 10*3/uL (ref 150–400)
RBC: 4.88 MIL/uL (ref 3.87–5.11)
RDW: 14.7 % (ref 11.5–15.5)
WBC: 13.4 10*3/uL — ABNORMAL HIGH (ref 4.0–10.5)
nRBC: 0 % (ref 0.0–0.2)

## 2023-04-11 LAB — TROPONIN I (HIGH SENSITIVITY): Troponin I (High Sensitivity): 5 ng/L (ref <18)

## 2023-04-11 LAB — HCG, SERUM, QUALITATIVE: Preg, Serum: NEGATIVE

## 2023-04-11 MED ORDER — PROCHLORPERAZINE EDISYLATE 10 MG/2ML IJ SOLN
10.0000 mg | Freq: Once | INTRAMUSCULAR | Status: AC
  Administered 2023-04-11: 10 mg via INTRAVENOUS
  Filled 2023-04-11: qty 2

## 2023-04-11 MED ORDER — ACETAMINOPHEN 500 MG PO TABS
1000.0000 mg | ORAL_TABLET | Freq: Once | ORAL | Status: AC
  Administered 2023-04-11: 1000 mg via ORAL
  Filled 2023-04-11: qty 2

## 2023-04-11 NOTE — ED Triage Notes (Signed)
Pt reports with htn. Pt states that she has been taking her Losartan as prescribed but today her pressures have been elevated. Pt complains of a headache and neck pain. Pt has been more stressed recently.

## 2023-04-11 NOTE — ED Notes (Signed)
Pt provided with a warm air blanket

## 2023-04-11 NOTE — ED Provider Notes (Signed)
[facility] AT [facility] Provider Note  CSN: [id number] Arrival date & time: 04/11/23 0028  Chief Complaint(s) Hypertension  HPI Robyn [name] is a 42 y.o. female here for elevated blood pressures with systolic BPs in the 200s.  Patient reports that she has had a headache for most of the day intermittently, when she checked her blood pressure this evening, she noted the hypertension.  She reports that she has been compliant with her blood pressure medication.  Denies any focal deficits.  No associated chest pain or shortness of breath.  Reports nausea w/o vomiting.  No recent fevers or infections.  No other physical complaints.  The history is provided by the patient.    Past Medical History Past Medical History:  Diagnosis Date   Anxiety    h/o panic attacks   Diabetes mellitus without complication (HCC)    diet controlled    Diverticulosis 02/16/2019   Herpes    Hypertension    Left ovarian cyst 11/29/2011   PCOS (polycystic ovarian syndrome)    Pregnancy induced hypertension    Shortness of breath dyspnea    with exertion and while pregnant   Smoker 11/29/2011   Patient Active Problem List   Diagnosis Date Noted   Tubo-ovarian abscess 08/06/2019   TOA (tubo-ovarian abscess) 08/05/2019   Colonic diverticular abscess 02/10/2019   Diverticulitis 02/09/2019   S/P repeat low transverse C-section 03/23/2015   Hypertension - on Labetalol 200 mg po bid 11/22/2014   Diabetes mellitus type 2, uncontrolled, without complications -- dx'd in 2011 11/22/2014   Hx of incompetent cervix, currently pregnant - cerclage placed on 10/09/14 by Dr. Su Hilt 11/22/2014   History of preterm delivery x 2 - weekly 17P injections - first injection at 16.5 wks 11/22/2014   Elderly multigravida 11/22/2014   PCOS (polycystic ovarian syndrome) 08/21/2014   Herpes 08/21/2014   ANA positive 08/21/2014   Previous cesarean delivery, antepartum condition or complication x  2--previous vertical incision 08/21/2014   Obesity 05/20/2012   Home Medication(s) Prior to Admission medications   Medication Sig Start Date End Date Taking? Authorizing Provider  amoxicillin-clavulanate (AUGMENTIN) 875-125 MG tablet Take 1 tablet by mouth every 12 (twelve) hours. Patient not taking: Reported on 03/26/2023 11/02/21   Molpus, Jonny Ruiz, MD  HYDROcodone-acetaminophen (NORCO) 5-325 MG tablet Take 2 tablets by mouth every 6 (six) hours as needed for severe pain. Patient not taking: Reported on 03/26/2023 11/02/21   Molpus, John, MD  losartan-hydrochlorothiazide (HYZAAR) 50-12.5 MG tablet Take 1 tablet by mouth daily. 03/14/23   Long, Arlyss Repress, MD  metFORMIN (GLUCOPHAGE-XR) 500 MG 24 hr tablet Take 1,000 mg by mouth 2 (two) times daily. Patient not taking: Reported on 03/26/2023 12/23/18   [provider]  naproxen (NAPROSYN) 375 MG tablet Take 1 tablet (375 mg total) by mouth 2 (two) times daily with a meal. Patient not taking: Reported on 03/26/2023 06/10/22   Tilden Fossa, MD  Allergies Patient has no known allergies.  Review of Systems Review of Systems As noted in HPI  Physical Exam Vital Signs  I have reviewed the triage vital signs BP (!) 141/73   Pulse 79   Temp 98.9 F (37.2 C)   Resp (!) 25   Ht 5\' 8"  (1.727 m)   Wt 123.4 kg   LMP 03/09/2023 (Approximate)   SpO2 98%   BMI 41.36 kg/m   Physical Exam Vitals reviewed.  Constitutional:      General: She is not in acute distress.    Appearance: She is well-developed. She is obese. She is not diaphoretic.  HENT:     Head: Normocephalic and atraumatic.     Nose: Nose normal.  Eyes:     General: No scleral icterus.       Right eye: No discharge.        Left eye: No discharge.     Conjunctiva/sclera: Conjunctivae normal.     Pupils: Pupils are equal, round, and reactive to  light.  Cardiovascular:     Rate and Rhythm: Normal rate and regular rhythm.     Heart sounds: No murmur heard.    No friction rub. No gallop.  Pulmonary:     Effort: Pulmonary effort is normal. No respiratory distress.     Breath sounds: Normal breath sounds. No stridor. No rales.  Abdominal:     General: There is no distension.     Palpations: Abdomen is soft.     Tenderness: There is no abdominal tenderness.  Musculoskeletal:        General: No tenderness.     Cervical back: Normal range of motion and neck supple.  Skin:    General: Skin is warm and dry.     Findings: No erythema or rash.  Neurological:     Mental Status: She is alert and oriented to person, place, and time.     Comments: Mental Status:  Alert and oriented to person, place, and time.  Attention and concentration normal.  Speech clear.  Recent memory is intact  Cranial Nerves:  II Visual Fields: Intact to confrontation. Visual fields intact. III, IV, VI: Pupils equal and reactive to light and near. Full eye movement without nystagmus  V Facial Sensation: Normal. No weakness of masticatory muscles  VII: No facial weakness or asymmetry  VIII Auditory Acuity: Grossly normal  IX/X: The uvula is midline; the palate elevates symmetrically  XI: Normal sternocleidomastoid and trapezius strength  XII: The tongue is midline. No atrophy or fasciculations.   Motor System: Muscle Strength: 5/5 and symmetric in the upper and lower extremities. No pronation or drift.  Muscle Tone: Tone and muscle bulk are normal in the upper and lower extremities.  Coordination: No tremor.  Sensation: Intact to light touch Gait: Routine  gait normal.      ED Results and Treatments Labs (all labs ordered are listed, but only abnormal results are displayed) Labs Reviewed  BASIC METABOLIC PANEL - Abnormal; Notable for the following components:      Result Value   Glucose, Bld 148 (*)    All other components within normal limits   CBC - Abnormal; Notable for the following components:   WBC 13.4 (*)    All other components within normal limits  HCG, SERUM, QUALITATIVE  TROPONIN I (HIGH SENSITIVITY)  EKG  EKG Interpretation Date/Time:  Wednesday April 11 2023 00:41:13 EST Ventricular Rate:  86 PR Interval:  164 QRS Duration:  98 QT Interval:  372 QTC Calculation: 445 R Axis:   61  Text Interpretation: Sinus rhythm Borderline T wave abnormalities Baseline wander in lead(s) III aVL aVF V1 V2 V3 Confirmed by Drema Pry [phone]) on 04/11/2023 1:40:55 AM       Radiology DG Chest 2 View  Result Date: 04/11/2023 CLINICAL DATA:  Elevated blood pressure with headache and neck pain. EXAM: CHEST - 2 VIEW COMPARISON:  January 13, 2021 FINDINGS: The heart size and mediastinal contours are within normal limits. Both lungs are clear. The visualized skeletal structures are unremarkable. IMPRESSION: No active cardiopulmonary disease. Electronically Signed   By: Aram Candela M.D.   On: 04/11/2023 03:14    Medications Ordered in ED Medications  acetaminophen (TYLENOL) tablet 1,000 mg (1,000 mg Oral Given 04/11/23 0147)  prochlorperazine (COMPAZINE) injection 10 mg (10 mg Intravenous Given 04/11/23 0147)   Procedures Procedures  (including critical care time) Medical Decision Making / ED Course   Medical Decision Making Amount and/or Complexity of Data Reviewed Labs: ordered. Decision-making details documented in ED Course. Radiology: ordered and independent interpretation performed. Decision-making details documented in ED Course. ECG/medicine tests: ordered and independent interpretation performed. Decision-making details documented in ED Course.  Risk OTC drugs. Prescription drug management.    Typical headache for the patient. Noted to be hypertensive with sBP 180s with appropriate  cuff.  Non focal neuro exam.  No fever. Doubt meningitis.  Doubt IIH. No recent head trauma. Doubt intracranial bleed.  No indication for imaging.   Will treat with migraine cocktail and reevaluate.  Labs and cardiac markers obtained in triage.  EKG without acute ischemic changes.  Troponin negative.  Patient has not complaining of any chest pain or shortness of breath.  Doubt ACS requiring additional troponins. CBC with mild leukocytosis.  No anemia.  BMP without significant electrolyte derangements or renal sufficiency. Chest x-ray without evidence of pneumonia, pneumothorax, pulmonary edema pleural effusions.  Headache resolved and BP improved.     Final Clinical Impression(s) / ED Diagnoses Final diagnoses:  Other secondary hypertension  Episodic tension-type headache, not intractable   The patient appears reasonably screened and/or stabilized for discharge and I doubt any other medical condition or other [facility] requiring further screening, evaluation, or treatment in the ED at this time. I have discussed the findings, Dx and Tx plan with the patient/family who expressed understanding and agree(s) with the plan. Discharge instructions discussed at length. The patient/family was given strict return precautions who verbalized understanding of the instructions. No further questions at time of discharge.  Disposition: Discharge  Condition: Good  ED Discharge Orders     None         Follow Up: Primary care provider  Schedule an appointment as soon as possible for a visit      This chart was dictated using voice recognition software.  Despite best efforts to proofread,  errors can occur which can change the documentation meaning.    Nira Conn, MD 04/11/23 [phone]

## 2023-07-19 ENCOUNTER — Telehealth: Payer: Self-pay | Admitting: Family Medicine

## 2023-07-19 NOTE — Telephone Encounter (Signed)
Called the patient to get a referral scheduled for her but there was no answer and left VM to call us back

## 2023-08-08 ENCOUNTER — Ambulatory Visit: Payer: Self-pay | Admitting: General Surgery

## 2023-08-08 NOTE — H&P (Signed)
 REFERRING PHYSICIAN:  Eleanora Neighbor, MD  PROVIDER:  Elenora Gamma, MD  MRN: [id number] DOB: Jun 20, 1980 DATE OF ENCOUNTER: 08/08/2023  Subjective   Chief Complaint: New Consultation (External hems)     History of Present Illness: Robyn [name] is a 43 y.o. female who is seen today as an office consultation at the request of Dr. Cloretta Ned for evaluation of New Consultation (External hems) .   Patient with ongoing issues of prolapsing hemorrhoids.  This has been for many years but has gotten worse over the last few months.  She describes occasional bleeding.  Colonoscopy showed large internal hemorrhoids that were not amenable to rubber band ligation.  She is here today for further evaluation.  She reports significant pain with prolapse after most bowel movements.    Review of Systems: A complete review of systems was obtained from the patient.  I have reviewed this information and discussed as appropriate with the patient.  See HPI as well for other ROS.   Medical History: Past Medical History:  Diagnosis Date   Diabetes mellitus type 2, uncomplicated (CMS/HHS-HCC) 2011   Essential hypertension 04/02/2012   Obesity 05/20/2012   Polycystic ovaries 08/21/2014    Patient Active Problem List  Diagnosis   Type 2 diabetes mellitus with hyperglycemia (CMS/HHS-HCC)   Elderly multigravida (HHS-HCC)   Essential hypertension   Obesity   Polycystic ovaries   Tobacco use disorder   ANA positive: Systemic lupus erythematosus (SLE) inhibitor    Past Surgical History:  Procedure Laterality Date   CERVICAL CERCLAGE VAGINAL  10/09/14   CESAREAN SECTION     x 2   Wisdom teeth extraction       No Known Allergies  Current Outpatient Medications on File Prior to Visit  Medication Sig Dispense Refill   atorvastatin (LIPITOR) 10 MG tablet Take 10 mg by mouth at bedtime     insulin ASPART PROTAMINE-ASPART (NOVOLOG MIX FLEXPEN 70/30) 100 unit/mL (70-30) pen injector 60 units  with breakfast, and 40 units with the evening meal, and pen needles 2/day     labetalol (TRANDATE) 200 MG tablet Take by mouth.     losartan-hydroCHLOROthiazide (HYZAAR) 50-12.5 mg tablet losartan 50 mg-hydrochlorothiazide 12.5 mg tablet TAKE 1 TABLET BY MOUTH ONCE DAILY     metFORMIN (GLUCOPHAGE) 1000 MG tablet metformin 1,000 mg tablet TAKE 1 TABLET BY MOUTH TWICE DAILY WITH MEALS     OZEMPIC 0.25 mg or 0.5 mg (2 mg/3 mL) pen injector INJECT 0.25 MG SUBCUTANENOUSLY EVERY WEEK FOR 4 WEEKS THEN INCREASE TO 0.5 MG EVERY WEEK     prenatal vit-iron fumarate-FA (PRENAVITE) tablet Take 1 tablet by mouth once daily.     No current facility-administered medications on file prior to visit.    Family History  Problem Relation Age of Onset   High blood pressure (Hypertension) Mother    Diabetes Father    Stroke Father      Social History   Tobacco Use  Smoking Status Former   Types: Cigarettes  Smokeless Tobacco Never     Social History   Socioeconomic History   Marital status: Single  Tobacco Use   Smoking status: Former    Types: Cigarettes   Smokeless tobacco: Never  Vaping Use   Vaping status: Every Day  Substance and Sexual Activity   Alcohol use: Yes   Drug use: Never   Social Drivers of Health   Food Insecurity: No Food Insecurity (07/12/2021)   Received from [facility], [facility]   Hunger  Vital Sign    Worried About Programme researcher, broadcasting/film/video in the Last Year: Never true    Ran Out of Food in the Last Year: Never true   Received from [facility], [facility]   Social Network    Objective:    Vitals:   08/08/23 1515 08/08/23 1516  BP: 129/85   Pulse: 86   Temp: 36.6 C (97.9 F)   SpO2: 98%   Weight: (!) 128.5 kg (283 lb 6.4 oz)   Height: 170.2 cm (5\' 7" )   PainSc:    8  PainLoc:  Buttocks     Exam Gen: NAD Abd: soft Rectal: Circumferential skin tags without signs of inflammation   Labs, Imaging and Diagnostic Testing:  Procedure:  Anoscopy Surgeon: Maisie Fus After the risks and benefits were explained, written consent was obtained for above procedure.  A medical assistant chaperone was present thoroughout the entire procedure.  Anesthesia: none Diagnosis: hemorrhoids Findings: Grade 3 right posterior and right anterior internal hemorrhoids  Assessment and Plan:  Diagnoses and all orders for this visit:  Prolapsed internal hemorrhoids, grade 3  Other orders -     hydrocortisone (ANUSOL-HC) 25 mg suppository; Place 1 suppository (25 mg total) rectally once daily as needed for Hemorrhoids for up to 10 days     43 year old female with grade 3 hemorrhoids that are causing her quite a bit of discomfort.  I have recommended proceeding with transsphenoidal dearterialization.  We discussed this in detail including the fact that it would address her internal hemorrhoids and prolapse but would not address her external hemorrhoids and skin tags.  I feel that this would adequately treat her symptoms.  We discussed typical recovery time of 3 to 4 weeks.  Return to work in 2 to 3 weeks.  We discussed symptoms of bleeding pain and recurrence after surgery.  Robyn Panda, MD Colon and Rectal Surgery [facility]

## 2023-09-02 NOTE — Progress Notes (Signed)
 COVID Vaccine received:  []  No [x]  Yes Date of any COVID positive Test in last 90 days:  PCP - none  Alpha Gula, MD ?ob-gyn Cardiologist -   Chest x-ray - 04-11-2023  2v  Epic EKG - 04-13-2023  Epic  Stress Test -  ECHO -  Cardiac Cath -   PCR screen: []  Ordered & Completed []   No Order but Needs PROFEND     [x]   N/A for this surgery  Surgery Plan:  [x]  Ambulatory   []  Outpatient in bed  []  Admit Anesthesia:    [x]  General  []  Spinal  []   Choice []   MAC  Bowel Prep - []  No  []   Yes ______     ????  Pacemaker / ICD device [x]  No []  Yes   Spinal Cord Stimulator:[x]  No []  Yes       History of Sleep Apnea? [x]  No []  Yes   CPAP used?- []  No []  Yes    Does the patient monitor blood sugar?   []  N/A   []  No []  Yes  Patient has: []  NO Hx DM   []  Pre-DM   []  DM1  [x]   DM2 Last A1c was: 6.8 on   07-13-23   Does patient have a [employer] or Dexcom? []  No []  Yes   Fasting Blood Sugar Ranges-  Checks Blood Sugar _____ times a day  Insulin Aspart (Novolog) 60 units q breakfast, 40 units q supper,  Metformin ? Ozempic ?  Blood Thinner / Instructions:  none Aspirin Instructions:   none  ERAS Protocol Ordered: []  No  []  Yes PRE-SURGERY []  ENSURE  []  G2   []  No Drink Ordered  Patient is to be NPO after:    ????? IB msg sent   Dental hx: []  Dentures:  []  N/A      []  Bridge or Partial:                   []  Loose or Damaged teeth:   Comments:   Activity level: Patient is able / unable to climb a flight of stairs without difficulty; []  No CP  []  No SOB, but would have ___   Patient can / can not perform ADLs without assistance.   Anesthesia review:  HTN,  DM2, vapes/smokes, panic attacks   Patient denies shortness of breath, fever, cough and chest pain at PAT appointment.  Patient verbalized understanding and agreement to the Pre-Surgical Instructions that were given to them at this PAT appointment. Patient was also educated of the need to review these PAT instructions again  prior to her surgery.I reviewed the appropriate phone numbers to call if they have any and questions or concerns.

## 2023-09-02 NOTE — Patient Instructions (Signed)
 SURGICAL WAITING ROOM VISITATION Patients having surgery or a procedure may have no more than 2 support people in the waiting area - these visitors may rotate in the visitor waiting room.   If the patient needs to stay at the hospital during part of their recovery, the visitor guidelines for inpatient rooms apply.  PRE-OP VISITATION  Pre-op nurse will coordinate an appropriate time for 1 support person to accompany the patient in pre-op.  This support person may not rotate.  This visitor will be contacted when the time is appropriate for the visitor to come back in the pre-op area.  Please refer to the [facility] website for the visitor guidelines for Inpatients (after your surgery is over and you are in a regular room).  You are not required to quarantine at this time prior to your surgery. However, you must do this: Hand Hygiene often Do NOT share personal items Notify your provider if you are in close contact with someone who has COVID or you develop fever 100.4 or greater, new onset of sneezing, cough, sore throat, shortness of breath or body aches.  If you test positive for Covid or have been in contact with anyone that has tested positive in the last 10 days please notify you surgeon.    Your procedure is scheduled on:  THURSDAY  September 13, 2023  Report to [facility] Main Entrance: [facility] entrance where the [employer] is available.   Report to admitting at:  10:15   AM  Call this number if you have any questions or problems the morning of surgery [phone]  FOLLOW ANY ADDITIONAL PRE OP INSTRUCTIONS YOU RECEIVED FROM YOUR SURGEON'S OFFICE!!!  Do not eat food after Midnight the night prior to your surgery/procedure.  After Midnight you may have the following liquids until   ???? 09:30 AM DAY OF SURGERY  Clear Liquid Diet Water Black Coffee (sugar ok, NO MILK/CREAM OR CREAMERS)  Tea (sugar ok, NO MILK/CREAM OR CREAMERS) regular and decaf                              Plain Jell-O  with no fruit (NO RED)                                           Fruit ices (not with fruit pulp, NO RED)                                     Popsicles (NO RED)                                                                  Juice: NO CITRUS JUICES: only apple, WHITE grape, WHITE cranberry Sports drinks like Gatorade or Powerade (NO RED)                 Oral Hygiene is also important to reduce your risk of infection.        Remember - BRUSH YOUR TEETH THE MORNING OF SURGERY WITH YOUR REGULAR TOOTHPASTE  Do NOT smoke after Midnight the night before surgery.  STOP TAKING all Vitamins, Herbs and supplements 1 week before your surgery.   Take ONLY these medicines the morning of surgery with A SIP OF WATER: labetolol ???   If You have been diagnosed with Sleep Apnea - Bring CPAP mask and tubing day of surgery. We will provide you with a CPAP machine on the day of your surgery.                   You may not have any metal on your body including hair pins, jewelry, and body piercing  Do not wear make-up, lotions, powders, perfumes or deodorant  Do not wear nail polish including gel and S&S, artificial / acrylic nails, or any other type of covering on natural nails including finger and toenails. If you have artificial nails, gel coating, etc., that needs to be removed by a nail salon, Please have this removed prior to surgery. Not doing so may mean that your surgery could be cancelled or delayed if the Surgeon or anesthesia staff feels like they are unable to monitor you safely.   Do not shave 48 hours prior to surgery to avoid nicks in your skin which may contribute to postoperative infections.   Contacts, Hearing Aids, dentures or bridgework may not be worn into surgery. DENTURES WILL BE REMOVED PRIOR TO SURGERY PLEASE DO NOT APPLY "Poly grip" OR ADHESIVES!!!  Patients discharged on the day of surgery will not be allowed to drive home.  Someone NEEDS to stay with you for the  first 24 hours after anesthesia.  Do not bring your home medications to the hospital. The Pharmacy will dispense medications listed on your medication list to you during your admission in the Hospital.  Please read over the following fact sheets you were given: IF YOU HAVE QUESTIONS ABOUT YOUR PRE-OP INSTRUCTIONS, PLEASE CALL [phone]   [facility] - Preparing for Surgery Before surgery, you can play an important role.  Because skin is not sterile, your skin needs to be as free of germs as possible.  You can reduce the number of germs on your skin by washing with CHG (chlorahexidine gluconate) soap before surgery.  CHG is an antiseptic cleaner which kills germs and bonds with the skin to continue killing germs even after washing. Please DO NOT use if you have an allergy to CHG or antibacterial soaps.  If your skin becomes reddened/irritated stop using the CHG and inform your nurse when you arrive at Short Stay. Do not shave (including legs and underarms) for at least 48 hours prior to the first CHG shower.  You may shave your face/neck.  Please follow these instructions carefully:  1.  Shower with CHG Soap the night before surgery and the  morning of surgery.  2.  If you choose to wash your hair, wash your hair first as usual with your normal  shampoo.  3.  After you shampoo, rinse your hair and body thoroughly to remove the shampoo.                             4.  Use CHG as you would any other liquid soap.  You can apply chg directly to the skin and wash.  Gently with a scrungie or clean washcloth.  5.  Apply the CHG Soap to your body ONLY FROM THE NECK DOWN.   Do not use on face/ open  Wound or open sores. Avoid contact with eyes, ears mouth and genitals (private parts).                       Wash face,  Genitals (private parts) with your normal soap.             6.  Wash thoroughly, paying special attention to the area where your  surgery  will be performed.  7.   Thoroughly rinse your body with warm water from the neck down.  8.  DO NOT shower/wash with your normal soap after using and rinsing off the CHG Soap.            9.  Pat yourself dry with a clean towel.            10.  Wear clean pajamas.            11.  Place clean sheets on your bed the night of your first shower and do not  sleep with pets.  ON THE DAY OF SURGERY : Do not apply any lotions/deodorants the morning of surgery.  Please wear clean clothes to the hospital/surgery center.     FAILURE TO FOLLOW THESE INSTRUCTIONS MAY RESULT IN THE CANCELLATION OF YOUR SURGERY  PATIENT SIGNATURE_________________________________  NURSE SIGNATURE__________________________________  ________________________________________________________________________

## 2023-09-04 ENCOUNTER — Encounter (HOSPITAL_COMMUNITY)
Admission: RE | Admit: 2023-09-04 | Discharge: 2023-09-04 | Disposition: A | Discharge status: Home / Self Care | Source: Ambulatory Visit | Attending: General Surgery | Admitting: General Surgery

## 2023-09-04 ENCOUNTER — Other Ambulatory Visit: Payer: Self-pay

## 2023-09-04 ENCOUNTER — Encounter (HOSPITAL_COMMUNITY): Payer: Self-pay

## 2023-09-04 VITALS — BP 142/88 | HR 79 | Temp 98.6°F | Resp 20 | Ht 68.0 in | Wt 280.0 lb

## 2023-09-04 DIAGNOSIS — Z794 Long term (current) use of insulin: Secondary | ICD-10-CM | POA: Insufficient documentation

## 2023-09-04 DIAGNOSIS — E119 Type 2 diabetes mellitus without complications: Secondary | ICD-10-CM | POA: Insufficient documentation

## 2023-09-04 DIAGNOSIS — I10 Essential (primary) hypertension: Secondary | ICD-10-CM | POA: Insufficient documentation

## 2023-09-04 DIAGNOSIS — Z01818 Encounter for other preprocedural examination: Secondary | ICD-10-CM

## 2023-09-04 DIAGNOSIS — Z01812 Encounter for preprocedural laboratory examination: Secondary | ICD-10-CM | POA: Diagnosis present

## 2023-09-04 HISTORY — DX: Unspecified osteoarthritis, unspecified site: M19.90

## 2023-09-04 LAB — CBC
HCT: 37.6 % (ref 36.0–46.0)
Hemoglobin: 12 g/dL (ref 12.0–15.0)
MCH: 28 pg (ref 26.0–34.0)
MCHC: 31.9 g/dL (ref 30.0–36.0)
MCV: 87.9 fL (ref 80.0–100.0)
Platelets: 244 10*3/uL (ref 150–400)
RBC: 4.28 MIL/uL (ref 3.87–5.11)
RDW: 14.5 % (ref 11.5–15.5)
WBC: 9.7 10*3/uL (ref 4.0–10.5)
nRBC: 0 % (ref 0.0–0.2)

## 2023-09-04 LAB — BASIC METABOLIC PANEL WITH GFR
Anion gap: 8 (ref 5–15)
BUN: 15 mg/dL (ref 6–20)
CO2: 26 mmol/L (ref 22–32)
Calcium: 9.2 mg/dL (ref 8.9–10.3)
Chloride: 107 mmol/L (ref 98–111)
Creatinine, Ser: 0.75 mg/dL (ref 0.44–1.00)
GFR, Estimated: >60 mL/min (ref >60)
Glucose, Bld: 100 mg/dL — ABNORMAL HIGH (ref 70–99)
Potassium: 4 mmol/L (ref 3.5–5.1)
Sodium: 141 mmol/L (ref 135–145)

## 2023-09-04 LAB — GLUCOSE, CAPILLARY: Glucose-Capillary: 100 mg/dL — ABNORMAL HIGH (ref 70–99)

## 2023-09-05 LAB — HEMOGLOBIN A1C
Hgb A1c MFr Bld: 7 % — ABNORMAL HIGH (ref 4.8–5.6)
Mean Plasma Glucose: 154 mg/dL

## 2023-09-09 ENCOUNTER — Emergency Department (HOSPITAL_BASED_OUTPATIENT_CLINIC_OR_DEPARTMENT_OTHER)

## 2023-09-09 ENCOUNTER — Other Ambulatory Visit: Payer: Self-pay

## 2023-09-09 ENCOUNTER — Emergency Department (HOSPITAL_BASED_OUTPATIENT_CLINIC_OR_DEPARTMENT_OTHER)
Admission: EM | Admit: 2023-09-09 | Discharge: 2023-09-09 | Disposition: A | Discharge status: Home / Self Care | Attending: Emergency Medicine | Admitting: Emergency Medicine

## 2023-09-09 ENCOUNTER — Encounter (HOSPITAL_BASED_OUTPATIENT_CLINIC_OR_DEPARTMENT_OTHER): Payer: Self-pay | Admitting: Emergency Medicine

## 2023-09-09 DIAGNOSIS — R1013 Epigastric pain: Secondary | ICD-10-CM | POA: Insufficient documentation

## 2023-09-09 DIAGNOSIS — Z7984 Long term (current) use of oral hypoglycemic drugs: Secondary | ICD-10-CM | POA: Insufficient documentation

## 2023-09-09 DIAGNOSIS — I10 Essential (primary) hypertension: Secondary | ICD-10-CM | POA: Diagnosis not present

## 2023-09-09 DIAGNOSIS — Z79899 Other long term (current) drug therapy: Secondary | ICD-10-CM | POA: Diagnosis not present

## 2023-09-09 DIAGNOSIS — E119 Type 2 diabetes mellitus without complications: Secondary | ICD-10-CM | POA: Diagnosis not present

## 2023-09-09 DIAGNOSIS — F17210 Nicotine dependence, cigarettes, uncomplicated: Secondary | ICD-10-CM | POA: Insufficient documentation

## 2023-09-09 LAB — CBC WITH DIFFERENTIAL/PLATELET
Abs Immature Granulocytes: 0.04 10*3/uL (ref 0.00–0.07)
Basophils Absolute: 0.1 10*3/uL (ref 0.0–0.1)
Basophils Relative: 1 %
Eosinophils Absolute: 0.1 10*3/uL (ref 0.0–0.5)
Eosinophils Relative: 1 %
HCT: 38.5 % (ref 36.0–46.0)
Hemoglobin: 12.7 g/dL (ref 12.0–15.0)
Immature Granulocytes: 1 %
Lymphocytes Relative: 33 %
Lymphs Abs: 2.9 10*3/uL (ref 0.7–4.0)
MCH: 28.1 pg (ref 26.0–34.0)
MCHC: 33 g/dL (ref 30.0–36.0)
MCV: 85.2 fL (ref 80.0–100.0)
Monocytes Absolute: 0.6 10*3/uL (ref 0.1–1.0)
Monocytes Relative: 7 %
Neutro Abs: 5 10*3/uL (ref 1.7–7.7)
Neutrophils Relative %: 57 %
Platelets: 275 10*3/uL (ref 150–400)
RBC: 4.52 MIL/uL (ref 3.87–5.11)
RDW: 14.5 % (ref 11.5–15.5)
WBC: 8.7 10*3/uL (ref 4.0–10.5)
nRBC: 0 % (ref 0.0–0.2)

## 2023-09-09 LAB — URINALYSIS, ROUTINE W REFLEX MICROSCOPIC
Bilirubin Urine: NEGATIVE
Glucose, UA: NEGATIVE mg/dL
Ketones, ur: NEGATIVE mg/dL
Leukocytes,Ua: NEGATIVE
Nitrite: NEGATIVE
Protein, ur: NEGATIVE mg/dL
Specific Gravity, Urine: 1.02 (ref 1.005–1.030)
pH: 7.5 (ref 5.0–8.0)

## 2023-09-09 LAB — COMPREHENSIVE METABOLIC PANEL WITH GFR
ALT: 15 U/L (ref 0–44)
AST: 14 U/L — ABNORMAL LOW (ref 15–41)
Albumin: 3.9 g/dL (ref 3.5–5.0)
Alkaline Phosphatase: 60 U/L (ref 38–126)
Anion gap: 8 (ref 5–15)
BUN: 12 mg/dL (ref 6–20)
CO2: 24 mmol/L (ref 22–32)
Calcium: 9 mg/dL (ref 8.9–10.3)
Chloride: 108 mmol/L (ref 98–111)
Creatinine, Ser: 0.64 mg/dL (ref 0.44–1.00)
GFR, Estimated: >60 mL/min (ref >60)
Glucose, Bld: 106 mg/dL — ABNORMAL HIGH (ref 70–99)
Potassium: 3.4 mmol/L — ABNORMAL LOW (ref 3.5–5.1)
Sodium: 140 mmol/L (ref 135–145)
Total Bilirubin: 0.6 mg/dL (ref 0.0–1.2)
Total Protein: 7.6 g/dL (ref 6.5–8.1)

## 2023-09-09 LAB — URINALYSIS, MICROSCOPIC (REFLEX)

## 2023-09-09 LAB — LIPASE, BLOOD: Lipase: 38 U/L (ref 11–51)

## 2023-09-09 LAB — PREGNANCY, URINE: Preg Test, Ur: NEGATIVE

## 2023-09-09 MED ORDER — PANTOPRAZOLE SODIUM 20 MG PO TBEC: 40.0000 mg | DELAYED_RELEASE_TABLET | Freq: Every day | ORAL | 0 refills | Status: DC

## 2023-09-09 MED ORDER — MORPHINE SULFATE (PF) 4 MG/ML IV SOLN
4.0000 mg | Freq: Once | INTRAVENOUS | Status: AC
  Administered 2023-09-09: 4 mg via INTRAVENOUS
  Filled 2023-09-09: qty 1

## 2023-09-09 MED ORDER — ALUM & MAG HYDROXIDE-SIMETH 200-200-20 MG/5ML PO SUSP
30.0000 mL | Freq: Once | ORAL | Status: AC
  Administered 2023-09-09: 30 mL via ORAL
  Filled 2023-09-09: qty 30

## 2023-09-09 MED ORDER — ONDANSETRON HCL 4 MG/2ML IJ SOLN
4.0000 mg | Freq: Once | INTRAMUSCULAR | Status: AC
  Administered 2023-09-09: 4 mg via INTRAVENOUS
  Filled 2023-09-09: qty 2

## 2023-09-09 MED ORDER — ONDANSETRON HCL 4 MG PO TABS: 4.0000 mg | ORAL_TABLET | ORAL | 0 refills | Status: AC | PRN

## 2023-09-09 MED ORDER — SUCRALFATE 1 G PO TABS
1.0000 g | ORAL_TABLET | Freq: Two times a day (BID) | ORAL | 0 refills | Status: AC
Start: 2023-09-09 — End: 2023-09-23

## 2023-09-09 MED ORDER — SUCRALFATE 1 G PO TABS: 1.0000 g | ORAL_TABLET | Freq: Two times a day (BID) | ORAL | 0 refills | Status: DC

## 2023-09-09 MED ORDER — SUCRALFATE 1 G PO TABS
1.0000 g | ORAL_TABLET | Freq: Three times a day (TID) | ORAL | 0 refills | Status: AC
Start: 2023-09-09 — End: 2023-09-16

## 2023-09-09 MED ORDER — PANTOPRAZOLE SODIUM 40 MG PO TBEC: 40.0000 mg | DELAYED_RELEASE_TABLET | Freq: Every day | ORAL | 0 refills | Status: AC

## 2023-09-09 MED ORDER — PANTOPRAZOLE SODIUM 40 MG PO TBEC
40.0000 mg | DELAYED_RELEASE_TABLET | Freq: Once | ORAL | Status: AC
  Administered 2023-09-09: 40 mg via ORAL
  Filled 2023-09-09: qty 1

## 2023-09-09 MED ORDER — IOHEXOL 300 MG/ML  SOLN
100.0000 mL | Freq: Once | INTRAMUSCULAR | Status: AC | PRN
  Administered 2023-09-09: 100 mL via INTRAVENOUS

## 2023-09-09 NOTE — Discharge Instructions (Addendum)
 Follow-up with your primary care doctor.  Not sure if this could be a side effect from your Ozempic.  I prescribed you some medication to help with stomach inflammation.  Take as prescribed.  Continue Tylenol 1000 mg every 6 hours as needed for pain.

## 2023-09-09 NOTE — ED Provider Notes (Signed)
 CT scan unremarkable per radiology report.  Stable ovarian cyst.  Her pain is in the epigastric region.  I do think that this is likely either from her Ozempic or could be from stomach inflammation.  Will prescribe PPI and Carafate and Zofran.  Close follow-up with primary care doctor.  Discussed with primary care doctor about continuing Ozempic if not improving.  Discharged in good condition.  This chart was dictated using voice recognition software.  Despite best efforts to proofread,  errors can occur which can change the documentation meaning.    Lowery Rue, DO 09/09/23 [phone]

## 2023-09-09 NOTE — ED Triage Notes (Signed)
 Epigastric pain that radiates to her back that started on Thursday and has waxed and waned since. Pt also endorses nausea without vomiting. Denies diarrhea, constipation or fever. Pt rocking on stretcher. Pt states pain improves after eating. States she has been taking Ibuprofen for pain, with some relief.

## 2023-09-09 NOTE — ED Provider Notes (Signed)
 [place] [facility] Provider Note  CSN: [id number] Arrival date & time: 09/09/23 0458  Chief Complaint(s) Abdominal Pain  HPI Robyn [name] is a 43 y.o. female with past medical history as below, significant for anxiety, DM, diverticulosis, hypertension, PCOS, obesity, TOA, C-section who presents to the ED with complaint of epigastric pain, nausea  Pain has been ongoing proximately 4 days, epigastric tightness, burning sensation radiation to her back.  She is having nausea but no vomiting.  No change in bowel or bladder function, no BRBPR or melena.  Has been taking Motrin for the discomfort which has provided some relief.  Pain describes intermittent, she reports some improvement with p.o. intake.  Past Medical History Past Medical History:  Diagnosis Date   Anxiety    h/o panic attacks   Arthritis    Diabetes mellitus without complication (HCC)    diet controlled    Diverticulosis 02/16/2019   Herpes    Hypertension    Left ovarian cyst 11/29/2011   PCOS (polycystic ovarian syndrome)    Pregnancy induced hypertension    Smoker 11/29/2011   Patient Active Problem List   Diagnosis Date Noted   Tubo-ovarian abscess 08/06/2019   TOA (tubo-ovarian abscess) 08/05/2019   Colonic diverticular abscess 02/10/2019   Diverticulitis 02/09/2019   S/P repeat low transverse C-section 03/23/2015   Hypertension - on Labetalol 200 mg po bid 11/22/2014   Diabetes mellitus type 2, uncontrolled, without complications -- dx'd in 2011 11/22/2014   Hx of incompetent cervix, currently pregnant - cerclage placed on 10/09/14 by Dr. Adelene Homer 11/22/2014   History of preterm delivery x 2 - weekly 17P injections - first injection at 16.5 wks 11/22/2014   Elderly multigravida 11/22/2014   PCOS (polycystic ovarian syndrome) 08/21/2014   Herpes 08/21/2014   ANA positive 08/21/2014   Previous cesarean delivery, antepartum condition or complication x 2--previous  vertical incision 08/21/2014   Obesity 05/20/2012   Home Medication(s) Prior to Admission medications   Medication Sig Start Date End Date Taking? Authorizing Provider  ondansetron (ZOFRAN) 4 MG tablet Take 1 tablet (4 mg total) by mouth every 4 (four) hours as needed for nausea or vomiting. 09/09/23  Yes Russella Courts A, DO  pantoprazole (PROTONIX) 40 MG tablet Take 1 tablet (40 mg total) by mouth daily for 14 days. 09/09/23 09/23/23 Yes Russella Courts A, DO  sucralfate (CARAFATE) 1 g tablet Take 1 tablet (1 g total) by mouth with breakfast, with lunch, and with evening meal for 7 days. 09/09/23 09/16/23 Yes Russella Courts A, DO  atorvastatin (LIPITOR) 10 MG tablet Take 10 mg by mouth at bedtime. 07/16/23   [provider]  cyanocobalamin (VITAMIN B12) 500 MCG tablet Take 500 mcg by mouth daily.    [provider]  ibuprofen (ADVIL) 800 MG tablet Take 800 mg by mouth every 8 (eight) hours as needed for mild pain (pain score 1-3) or moderate pain (pain score 4-6).    [provider]  losartan-hydrochlorothiazide (HYZAAR) 100-25 MG tablet Take 1 tablet by mouth daily.    [provider]  losartan-hydrochlorothiazide (HYZAAR) 50-12.5 MG tablet Take 1 tablet by mouth daily. Patient not taking: Reported on 09/04/2023 03/14/23   Long, Joshua G, MD  metFORMIN (GLUCOPHAGE-XR) 500 MG 24 hr tablet Take 1,000 mg by mouth 2 (two) times daily. Patient taking differently: Take 500 mg by mouth 2 (two) times daily. 12/23/18   [provider]  OZEMPIC, 0.25 OR 0.5 MG/DOSE, 2 MG/3ML SOPN Inject  1 Dose into the skin once a week. 07/16/23   [provider]  Vitamin D, Ergocalciferol, (DRISDOL) 1.25 MG (50000 UNIT) CAPS capsule Take 50,000 Units by mouth every 7 (seven) days.    [provider]                                                                                                                                    Past Surgical History Past Surgical History:   Procedure Laterality Date   CERVICAL CERCLAGE N/A 10/09/2014   Procedure: CERCLAGE CERVICAL;  Surgeon: Renea Carrion, MD;  Location: [facility];  Service: Gynecology;  Laterality: N/A;   CESAREAN SECTION     C/S x 2   CESAREAN SECTION WITH BILATERAL TUBAL LIGATION Bilateral 03/23/2015   Procedure: CESAREAN SECTION WITH BILATERAL TUBAL LIGATION;  Surgeon: Renea Carrion, MD;  Location: [facility];  Service: Obstetrics;  Laterality: Bilateral;  Amt OK'd to move 02/25/2015   DILATION AND CURETTAGE OF UTERUS  2006   in [place]    FLEXIBLE SIGMOIDOSCOPY N/A 05/08/2019   Procedure: FLEXIBLE SIGMOIDOSCOPY;  Surgeon: Joyce Nixon, MD;  Location: [facility];  Service: Endoscopy;  Laterality: N/A;   IR RADIOLOGIST EVAL & MGMT  02/20/2019   IR RADIOLOGIST EVAL & MGMT  03/06/2019   IR RADIOLOGIST EVAL & MGMT  08/21/2019   WISDOM TOOTH EXTRACTION     Family History Family History  Problem Relation Age of Onset   Hypertension Mother    Diabetes Father    Stroke Father    Cancer Maternal Grandmother    Cancer Maternal Uncle     Social History Social History   Tobacco Use   Smoking status: Every Day    Current packs/day: 0.50    Average packs/day: 0.5 packs/day for 13.0 years (6.5 ttl pk-yrs)    Types: Cigarettes   Smokeless tobacco: Never  Vaping Use   Vaping status: Former  Substance Use Topics   Alcohol use: Yes    Comment: socially and rarely   Drug use: No   Allergies Patient has no known allergies.  Review of Systems A thorough review of systems was obtained and all systems are negative except as noted in the HPI and PMH.   Physical Exam Vital Signs  I have reviewed the triage vital signs BP (!) 166/89 (BP Location: Right Arm)   Pulse 85   Temp 99 F (37.2 C)   Resp 20   Ht 5\' 7"  (1.702 m)   Wt 126.6 kg   LMP 09/01/2023 (Exact Date)   SpO2 97%   BMI 43.70 kg/m  Physical Exam Vitals and nursing note reviewed.  Constitutional:      General: She is not in acute  distress.    Appearance: Normal appearance. She is well-developed. She is obese.  HENT:     Head: Normocephalic and atraumatic.     Right Ear: External ear normal.  Left Ear: External ear normal.     Nose: Nose normal.     Mouth/Throat:     Mouth: Mucous membranes are moist.  Eyes:     General: No scleral icterus.       Right eye: No discharge.        Left eye: No discharge.  Cardiovascular:     Rate and Rhythm: Normal rate and regular rhythm.     Pulses: Normal pulses.     Heart sounds: Normal heart sounds.  Pulmonary:     Effort: Pulmonary effort is normal. No respiratory distress.     Breath sounds: Normal breath sounds. No stridor.  Abdominal:     General: Abdomen is flat. There is no distension.     Palpations: Abdomen is soft.     Tenderness: There is abdominal tenderness in the epigastric area.  Musculoskeletal:     Cervical back: No rigidity.     Right lower leg: No edema.     Left lower leg: No edema.  Skin:    General: Skin is warm and dry.     Capillary Refill: Capillary refill takes less than 2 seconds.  Neurological:     Mental Status: She is alert.  Psychiatric:        Mood and Affect: Mood normal.        Behavior: Behavior normal. Behavior is cooperative.     ED Results and Treatments Labs (all labs ordered are listed, but only abnormal results are displayed) Labs Reviewed  COMPREHENSIVE METABOLIC PANEL WITH GFR - Abnormal; Notable for the following components:      Result Value   Potassium 3.4 (*)    Glucose, Bld 106 (*)    AST 14 (*)    All other components within normal limits  URINALYSIS, ROUTINE W REFLEX MICROSCOPIC - Abnormal; Notable for the following components:   Hgb urine dipstick TRACE (*)    All other components within normal limits  URINALYSIS, MICROSCOPIC (REFLEX) - Abnormal; Notable for the following components:   Bacteria, UA FEW (*)    All other components within normal limits  CBC WITH DIFFERENTIAL/PLATELET  LIPASE, BLOOD   PREGNANCY, URINE                                                                                                                          Radiology No results found.  Pertinent labs & imaging results that were available during my care of the patient were reviewed by me and considered in my medical decision making (see MDM for details).  Medications Ordered in ED Medications  morphine (PF) 4 MG/ML injection 4 mg (4 mg Intravenous Given 09/09/23 0610)  ondansetron (ZOFRAN) injection 4 mg (4 mg Intravenous Given 09/09/23 0610)  alum & mag hydroxide-simeth (MAALOX/MYLANTA) 200-200-20 MG/5ML suspension 30 mL (30 mLs Oral Given 09/09/23 0654)  Procedures Procedures  (including critical care time)  Medical Decision Making / ED Course    Medical Decision Making:    Jalan Bodi is a 43 y.o. female with past medical history as below, significant for anxiety, DM, diverticulosis, hypertension, PCOS, obesity, TOA, C-section who presents to the ED with complaint of epigastric pain, nausea. The complaint involves an extensive differential diagnosis and also carries with it a high risk of complications and morbidity.  Serious etiology was considered. Ddx includes but is not limited to: Differential diagnosis includes but is not exclusive to acute cholecystitis, intrathoracic causes for epigastric abdominal pain, gastritis, duodenitis, pancreatitis, small bowel or large bowel obstruction, abdominal aortic aneurysm, hernia, gastritis, etc.   Complete initial physical exam performed, notably the patient was in no distress, sitting comfortably on stretcher.    Reviewed and confirmed nursing documentation for past medical history, family history, social history.  Vital signs reviewed.       Brief summary: 43 year old female history as above here with epigastric pain,  nausea over the past few days.  She is mildly TTP to epigastrium.  HDS, she is well-appearing overall.  Will collect screening labs, give analgesics. `  She is feeling better on recheck  Labs stable  Pending ct at handoff               Additional history obtained: -Additional history obtained from na -External records from outside source obtained and reviewed including: Chart review including previous notes, labs, imaging, consultation notes including  Prior ED visits, home medications, prior labs, prior imaging   Lab Tests: -I ordered, reviewed, and interpreted labs.   The pertinent results include:   Labs Reviewed  COMPREHENSIVE METABOLIC PANEL WITH GFR - Abnormal; Notable for the following components:      Result Value   Potassium 3.4 (*)    Glucose, Bld 106 (*)    AST 14 (*)    All other components within normal limits  URINALYSIS, ROUTINE W REFLEX MICROSCOPIC - Abnormal; Notable for the following components:   Hgb urine dipstick TRACE (*)    All other components within normal limits  URINALYSIS, MICROSCOPIC (REFLEX) - Abnormal; Notable for the following components:   Bacteria, UA FEW (*)    All other components within normal limits  CBC WITH DIFFERENTIAL/PLATELET  LIPASE, BLOOD  PREGNANCY, URINE    Notable for labs stable  EKG   EKG Interpretation Date/Time:    Ventricular Rate:    PR Interval:    QRS Duration:    QT Interval:    QTC Calculation:   R Axis:      Text Interpretation:           Imaging Studies ordered: I ordered imaging studies including CT A/P > pending  Medicines ordered and prescription drug management: Meds ordered this encounter  Medications   morphine (PF) 4 MG/ML injection 4 mg   ondansetron (ZOFRAN) injection 4 mg   alum & mag hydroxide-simeth (MAALOX/MYLANTA) 200-200-20 MG/5ML suspension 30 mL   pantoprazole (PROTONIX) 40 MG tablet    Sig: Take 1 tablet (40 mg total) by mouth daily for 14 days.    Dispense:  14  tablet    Refill:  0   sucralfate (CARAFATE) 1 g tablet    Sig: Take 1 tablet (1 g total) by mouth with breakfast, with lunch, and with evening meal for 7 days.    Dispense:  21 tablet    Refill:  0   ondansetron (ZOFRAN) 4 MG tablet  Sig: Take 1 tablet (4 mg total) by mouth every 4 (four) hours as needed for nausea or vomiting.    Dispense:  12 tablet    Refill:  0    -I have reviewed the patients home medicines and have made adjustments as needed   Consultations Obtained: na   Cardiac Monitoring: Continuous pulse oximetry interpreted by myself, 100% on ra.    Social Determinants of Health:  Diagnosis or treatment significantly limited by social determinants of health: obesity, smoker   Reevaluation: After the interventions noted above, I reevaluated the patient and found that they have improved  Co morbidities that complicate the patient evaluation  Past Medical History:  Diagnosis Date   Anxiety    h/o panic attacks   Arthritis    Diabetes mellitus without complication (HCC)    diet controlled    Diverticulosis 02/16/2019   Herpes    Hypertension    Left ovarian cyst 11/29/2011   PCOS (polycystic ovarian syndrome)    Pregnancy induced hypertension    Smoker 11/29/2011      Dispostion: Disposition decision including need for hospitalization was considered, and patient disposition pending at time of sign out.    Final Clinical Impression(s) / ED Diagnoses Final diagnoses:  Epigastric pain        Teddi Favors, DO 09/09/23 [id number]

## 2023-09-13 ENCOUNTER — Ambulatory Visit (HOSPITAL_BASED_OUTPATIENT_CLINIC_OR_DEPARTMENT_OTHER): Admitting: Anesthesiology

## 2023-09-13 ENCOUNTER — Ambulatory Visit (HOSPITAL_COMMUNITY): Admitting: Anesthesiology

## 2023-09-13 ENCOUNTER — Other Ambulatory Visit: Payer: Self-pay

## 2023-09-13 ENCOUNTER — Ambulatory Visit (HOSPITAL_COMMUNITY)
Admission: RE | Admit: 2023-09-13 | Discharge: 2023-09-13 | Disposition: A | Discharge status: Home / Self Care | Source: Ambulatory Visit | Attending: General Surgery | Admitting: General Surgery

## 2023-09-13 ENCOUNTER — Encounter (HOSPITAL_COMMUNITY): Payer: Self-pay | Admitting: General Surgery

## 2023-09-13 ENCOUNTER — Encounter (HOSPITAL_COMMUNITY)
Admission: RE | Disposition: A | Discharge status: Home / Self Care | Payer: Self-pay | Source: Ambulatory Visit | Attending: General Surgery

## 2023-09-13 DIAGNOSIS — K642 Third degree hemorrhoids: Secondary | ICD-10-CM | POA: Diagnosis present

## 2023-09-13 DIAGNOSIS — Z7985 Long-term (current) use of injectable non-insulin antidiabetic drugs: Secondary | ICD-10-CM | POA: Diagnosis not present

## 2023-09-13 DIAGNOSIS — Z6841 Body Mass Index (BMI) 40.0 and over, adult: Secondary | ICD-10-CM | POA: Insufficient documentation

## 2023-09-13 DIAGNOSIS — I10 Essential (primary) hypertension: Secondary | ICD-10-CM | POA: Insufficient documentation

## 2023-09-13 DIAGNOSIS — E282 Polycystic ovarian syndrome: Secondary | ICD-10-CM | POA: Diagnosis not present

## 2023-09-13 DIAGNOSIS — E119 Type 2 diabetes mellitus without complications: Secondary | ICD-10-CM | POA: Insufficient documentation

## 2023-09-13 DIAGNOSIS — E6689 Other obesity not elsewhere classified: Secondary | ICD-10-CM | POA: Insufficient documentation

## 2023-09-13 DIAGNOSIS — F17290 Nicotine dependence, other tobacco product, uncomplicated: Secondary | ICD-10-CM | POA: Insufficient documentation

## 2023-09-13 DIAGNOSIS — K219 Gastro-esophageal reflux disease without esophagitis: Secondary | ICD-10-CM | POA: Insufficient documentation

## 2023-09-13 DIAGNOSIS — Z7984 Long term (current) use of oral hypoglycemic drugs: Secondary | ICD-10-CM | POA: Diagnosis not present

## 2023-09-13 DIAGNOSIS — Z79899 Other long term (current) drug therapy: Secondary | ICD-10-CM | POA: Diagnosis not present

## 2023-09-13 DIAGNOSIS — Z01818 Encounter for other preprocedural examination: Secondary | ICD-10-CM

## 2023-09-13 DIAGNOSIS — Z794 Long term (current) use of insulin: Secondary | ICD-10-CM | POA: Diagnosis not present

## 2023-09-13 HISTORY — PX: HEMORRHOID SURGERY: SHX153

## 2023-09-13 LAB — GLUCOSE, CAPILLARY
Glucose-Capillary: 119 mg/dL — ABNORMAL HIGH (ref 70–99)
Glucose-Capillary: 149 mg/dL — ABNORMAL HIGH (ref 70–99)

## 2023-09-13 LAB — POCT PREGNANCY, URINE: Preg Test, Ur: NEGATIVE

## 2023-09-13 SURGERY — HEMORRHOIDECTOMY
Anesthesia: General

## 2023-09-13 MED ORDER — MIDAZOLAM HCL 2 MG/2ML IJ SOLN: 0.5000 mg | Freq: Once | INTRAMUSCULAR | Status: DC | PRN

## 2023-09-13 MED ORDER — MEPERIDINE HCL 50 MG/ML IJ SOLN: 6.2500 mg | INTRAMUSCULAR | Status: DC | PRN

## 2023-09-13 MED ORDER — ONDANSETRON HCL 4 MG/2ML IJ SOLN
INTRAMUSCULAR | Status: DC | PRN
  Administered 2023-09-13: 4 mg via INTRAVENOUS

## 2023-09-13 MED ORDER — PROPOFOL 10 MG/ML IV BOLUS
INTRAVENOUS | Status: AC
  Filled 2023-09-13: qty 20

## 2023-09-13 MED ORDER — LACTATED RINGERS IV SOLN: INTRAVENOUS | Status: DC

## 2023-09-13 MED ORDER — FENTANYL CITRATE (PF) 100 MCG/2ML IJ SOLN
INTRAMUSCULAR | Status: DC | PRN
  Administered 2023-09-13: 100 ug via INTRAVENOUS
  Administered 2023-09-13 (×2): 50 ug via INTRAVENOUS

## 2023-09-13 MED ORDER — HYDROMORPHONE HCL 1 MG/ML IJ SOLN
INTRAMUSCULAR | Status: AC
Start: 2023-09-13 — End: 2023-09-13
  Administered 2023-09-13: 0.5 mg via INTRAVENOUS
  Filled 2023-09-13: qty 2

## 2023-09-13 MED ORDER — INSULIN ASPART 100 UNIT/ML IJ SOLN: 0.0000 [IU] | INTRAMUSCULAR | Status: DC | PRN

## 2023-09-13 MED ORDER — 0.9 % SODIUM CHLORIDE (POUR BTL) OPTIME
TOPICAL | Status: DC | PRN
  Administered 2023-09-13: 1000 mL

## 2023-09-13 MED ORDER — PROPOFOL 10 MG/ML IV BOLUS
INTRAVENOUS | Status: DC | PRN
  Administered 2023-09-13: 200 mg via INTRAVENOUS

## 2023-09-13 MED ORDER — ACETAMINOPHEN 500 MG PO TABS
1000.0000 mg | ORAL_TABLET | ORAL | Status: AC
  Administered 2023-09-13: 1000 mg via ORAL
  Filled 2023-09-13: qty 2

## 2023-09-13 MED ORDER — BUPIVACAINE LIPOSOME 1.3 % IJ SUSP: 20.0000 mL | Freq: Once | INTRAMUSCULAR | Status: DC

## 2023-09-13 MED ORDER — BUPIVACAINE LIPOSOME 1.3 % IJ SUSP
INTRAMUSCULAR | Status: AC
  Filled 2023-09-13: qty 20

## 2023-09-13 MED ORDER — CHLORHEXIDINE GLUCONATE 0.12 % MT SOLN
15.0000 mL | Freq: Once | OROMUCOSAL | Status: AC
  Administered 2023-09-13: 15 mL via OROMUCOSAL

## 2023-09-13 MED ORDER — ROCURONIUM BROMIDE 10 MG/ML (PF) SYRINGE
PREFILLED_SYRINGE | INTRAVENOUS | Status: AC
  Filled 2023-09-13: qty 10

## 2023-09-13 MED ORDER — ORAL CARE MOUTH RINSE: 15.0000 mL | Freq: Once | OROMUCOSAL | Status: AC

## 2023-09-13 MED ORDER — BUPIVACAINE LIPOSOME 1.3 % IJ SUSP
INTRAMUSCULAR | Status: DC | PRN
  Administered 2023-09-13: 20 mL

## 2023-09-13 MED ORDER — OXYCODONE HCL 5 MG PO TABS
ORAL_TABLET | ORAL | Status: AC
  Filled 2023-09-13: qty 1

## 2023-09-13 MED ORDER — OXYCODONE HCL 5 MG PO TABS: 5.0000 mg | ORAL_TABLET | Freq: Four times a day (QID) | ORAL | 0 refills | Status: DC | PRN

## 2023-09-13 MED ORDER — ONDANSETRON HCL 4 MG/2ML IJ SOLN
INTRAMUSCULAR | Status: AC
  Filled 2023-09-13: qty 2

## 2023-09-13 MED ORDER — MIDAZOLAM HCL 5 MG/5ML IJ SOLN
INTRAMUSCULAR | Status: DC | PRN
  Administered 2023-09-13: 2 mg via INTRAVENOUS

## 2023-09-13 MED ORDER — FENTANYL CITRATE (PF) 100 MCG/2ML IJ SOLN
INTRAMUSCULAR | Status: AC
  Filled 2023-09-13: qty 2

## 2023-09-13 MED ORDER — OXYCODONE HCL 5 MG PO TABS
5.0000 mg | ORAL_TABLET | Freq: Once | ORAL | Status: AC | PRN
  Administered 2023-09-13: 5 mg via ORAL

## 2023-09-13 MED ORDER — MIDAZOLAM HCL 2 MG/2ML IJ SOLN
INTRAMUSCULAR | Status: AC
  Filled 2023-09-13: qty 2

## 2023-09-13 MED ORDER — DEXAMETHASONE SODIUM PHOSPHATE 10 MG/ML IJ SOLN
INTRAMUSCULAR | Status: AC
  Filled 2023-09-13: qty 1

## 2023-09-13 MED ORDER — OXYCODONE HCL 5 MG/5ML PO SOLN: 5.0000 mg | Freq: Once | ORAL | Status: AC | PRN

## 2023-09-13 MED ORDER — SODIUM CHLORIDE 0.9% FLUSH: 3.0000 mL | Freq: Two times a day (BID) | INTRAVENOUS | Status: DC

## 2023-09-13 MED ORDER — BUPIVACAINE-EPINEPHRINE 0.5% -1:200000 IJ SOLN
INTRAMUSCULAR | Status: DC | PRN
  Administered 2023-09-13: 30 mL

## 2023-09-13 MED ORDER — LIDOCAINE HCL (CARDIAC) PF 100 MG/5ML IV SOSY
PREFILLED_SYRINGE | INTRAVENOUS | Status: DC | PRN
  Administered 2023-09-13: 20 mg via INTRAVENOUS

## 2023-09-13 MED ORDER — LIDOCAINE HCL (PF) 2 % IJ SOLN
INTRAMUSCULAR | Status: AC
Start: 2023-09-13 — End: ?
  Filled 2023-09-13: qty 5

## 2023-09-13 MED ORDER — DEXAMETHASONE SODIUM PHOSPHATE 4 MG/ML IJ SOLN
INTRAMUSCULAR | Status: DC | PRN
  Administered 2023-09-13: 4 mg via INTRAVENOUS

## 2023-09-13 MED ORDER — CELECOXIB 200 MG PO CAPS
200.0000 mg | ORAL_CAPSULE | ORAL | Status: AC
Start: 2023-09-13 — End: 2023-09-13
  Administered 2023-09-13: 200 mg via ORAL
  Filled 2023-09-13: qty 1

## 2023-09-13 MED ORDER — HYDROMORPHONE HCL 1 MG/ML IJ SOLN
0.2500 mg | INTRAMUSCULAR | Status: DC | PRN
  Administered 2023-09-13 (×3): 0.5 mg via INTRAVENOUS

## 2023-09-13 MED ORDER — HYDROMORPHONE HCL 1 MG/ML IJ SOLN
INTRAMUSCULAR | Status: AC
  Filled 2023-09-13: qty 1

## 2023-09-13 MED ORDER — HYDROMORPHONE HCL 1 MG/ML IJ SOLN
0.2500 mg | INTRAMUSCULAR | Status: DC | PRN
  Administered 2023-09-13: 0.5 mg via INTRAVENOUS

## 2023-09-13 MED ORDER — BUPIVACAINE-EPINEPHRINE (PF) 0.5% -1:200000 IJ SOLN
INTRAMUSCULAR | Status: AC
  Filled 2023-09-13: qty 30

## 2023-09-13 MED ORDER — GABAPENTIN 300 MG PO CAPS
300.0000 mg | ORAL_CAPSULE | ORAL | Status: AC
  Administered 2023-09-13: 300 mg via ORAL
  Filled 2023-09-13: qty 1

## 2023-09-13 SURGICAL SUPPLY — 27 items
BAG COUNTER SPONGE SURGICOUNT (BAG) IMPLANT
BLADE SURG 15 STRL LF DISP TIS (BLADE) ×1 IMPLANT
BLADE SURG SZ10 CARB STEEL (BLADE) ×1 IMPLANT
DRAIN PENROSE 0.25X18 (DRAIN) IMPLANT
DRAPE LAPAROTOMY T 98X78 PEDS (DRAPES) ×1 IMPLANT
ELECT REM PT RETURN 15FT ADLT (MISCELLANEOUS) ×1 IMPLANT
GAUZE 4X4 16PLY ~~LOC~~+RFID DBL (SPONGE) ×1 IMPLANT
GAUZE PAD ABD 8X10 STRL (GAUZE/BANDAGES/DRESSINGS) IMPLANT
GAUZE SPONGE 4X4 12PLY STRL (GAUZE/BANDAGES/DRESSINGS) IMPLANT
GLOVE BIO SURGEON STRL SZ 6.5 (GLOVE) ×1 IMPLANT
GLOVE INDICATOR 6.5 STRL GRN (GLOVE) ×2 IMPLANT
GOWN STRL REUS W/ TWL XL LVL3 (GOWN DISPOSABLE) ×2 IMPLANT
KIT BASIN OR (CUSTOM PROCEDURE TRAY) ×1 IMPLANT
KIT TURNOVER KIT A (KITS) ×1 IMPLANT
PACK GENERAL/GYN (CUSTOM PROCEDURE TRAY) ×1 IMPLANT
PACK LITHOTOMY IV (CUSTOM PROCEDURE TRAY) ×1 IMPLANT
PENCIL SMOKE EVACUATOR (MISCELLANEOUS) IMPLANT
SET IRRIG Y TYPE TUR BLADDER L (SET/KITS/TRAYS/PACK) ×1 IMPLANT
SPONGE HEMORRHOID 8X3CM (HEMOSTASIS) IMPLANT
SURGILUBE 2OZ TUBE FLIPTOP (MISCELLANEOUS) ×1 IMPLANT
SUT CHROMIC 2 0 SH (SUTURE) ×1 IMPLANT
SUT CHROMIC 3 0 SH 27 (SUTURE) ×1 IMPLANT
SUT SILK 2-0 18XBRD TIE 12 (SUTURE) IMPLANT
SUT VIC AB 2-0 UR6 27 (SUTURE) IMPLANT
SUT VICRYL 0 UR6 27IN ABS (SUTURE) IMPLANT
TOWEL OR 17X26 10 PK STRL BLUE (TOWEL DISPOSABLE) ×1 IMPLANT
WATER STERILE IRR 500ML POUR (IV SOLUTION) ×1 IMPLANT

## 2023-09-13 NOTE — Op Note (Signed)
 09/13/2023  11:07 AM  PATIENT:  Robyn [name]  43 y.o. female  Patient Care Team: Murry Art as PCP - General (Physician Assistant)  PRE-OPERATIVE DIAGNOSIS:  GRADE 3 HEMORRHOIDS  POST-OPERATIVE DIAGNOSIS:  GRADE 3 HEMORRHOIDS  PROCEDURE:  HEMORRHOIDOPEXY, 3 column    Surgeon(s): Joyce Nixon, MD  ASSISTANT: none   ANESTHESIA:   local and MAC  EBL:  Total I/O In: -  Out: 10 [Blood:10]  DRAINS: none   SPECIMEN:  No Specimen  DISPOSITION OF SPECIMEN:  N/A  COUNTS:  YES  PLAN OF CARE: Discharge to home after PACU  PATIENT DISPOSITION:  PACU - hemodynamically stable.  INDICATION: 43 y.o. F with bleeding grade 3 hemorrhoids   OR FINDINGS: Grade 3 hemorrhoids at all 3 locations  Description: Informed consent was confirmed. Patient underwent general anesthesia without difficulty. Patient was placed into lithotomy positioning.  The perianal region was prepped and draped in sterile fashion. Surgical time out confirmed or plan.  I did digital rectal examination and then transitioned over to anoscopy to get a sense of the anatomy.  I proceeded to ligate the hemorrhoidal arteries. I used a 2-0 Vicryl suture on a UR-6 needle in a figure-of-eight fashion around 6 cm proximal to the anal verge. I then ran that stitch longitudinally more distally to the dentate line. I then tied that stitch down to cause a hemorrhoidopexy. I did that for all 6 locations (right posterior/lateral/anterior, left posterior /lateral/anterior).  An additional stitch was placed at left posterior for additional pexy of prolapsed tissue    At completion of this, all hemorrhoids were reduced into the rectum.  There is no more prolapse. External anatomy looked normal.  I repeated anoscopy and examination.  Hemostasis was good. I placed a soft Gelfoam cylinder into the rectum. Patient is being extubated go to recovery room.  I am about to discuss the patient's status to the family.     [name], MD  Colorectal and General Surgery [facility] Surgery

## 2023-09-13 NOTE — Transfer of Care (Signed)
 Immediate Anesthesia Transfer of Care Note  Patient: Robyn [name]  Procedure(s) Performed: HEMORRHOIDOPEXY  Patient Location: PACU  Anesthesia Type:General  Level of Consciousness: awake  Airway & Oxygen Therapy: Patient Spontanous Breathing  Post-op Assessment: Report given to RN  Post vital signs: Reviewed  Last Vitals:  Vitals Value Taken Time  BP 150/78 09/13/23  1121  Temp 36.7 09/13/23  1121  Pulse 75 09/13/23 1121  Resp 14 09/13/23 1121  SpO2 100 % 09/13/23 1121  Vitals shown include unfiled device data.  Last Pain:  Vitals:   09/13/23 0835  TempSrc:   PainSc: 9       Patients Stated Pain Goal: 5 (09/13/23 0829)  Complications: No notable events documented.

## 2023-09-13 NOTE — Anesthesia Preprocedure Evaluation (Signed)
 Anesthesia Evaluation  Patient identified by MRN, date of birth, ID band Patient awake    Reviewed: Allergy & Precautions, NPO status , Patient's Chart, lab work & pertinent test results  Airway Mallampati: II  TM Distance: >3 FB Neck ROM: Full    Dental  (+) Dental Advisory Given   Pulmonary Current SmokerPatient did not abstain from smoking.   breath sounds clear to auscultation       Cardiovascular hypertension, Pt. on medications (-) angina  Rhythm:Regular Rate:Normal     Neuro/Psych   Anxiety     negative neurological ROS     GI/Hepatic Neg liver ROS,GERD  Medicated and Controlled,,  Endo/Other  diabetes (glu 119), Oral Hypoglycemic Agents  Class 4 obesityOzempic: last shot 2 weeks ago PCOS BMI 43.7  Renal/GU negative Renal ROS     Musculoskeletal   Abdominal   Peds  Hematology negative hematology ROS (+)   Anesthesia Other Findings   Reproductive/Obstetrics                             Anesthesia Physical Anesthesia Plan  ASA: 3  Anesthesia Plan: General   Post-op Pain Management: Tylenol PO (pre-op)*   Induction: Intravenous  PONV Risk Score and Plan: 2 and Ondansetron and Dexamethasone  Airway Management Planned: Oral ETT  Additional Equipment: None  Intra-op Plan:   Post-operative Plan: Extubation in OR  Informed Consent: I have reviewed the patients History and Physical, chart, labs and discussed the procedure including the risks, benefits and alternatives for the proposed anesthesia with the patient or authorized representative who has indicated his/her understanding and acceptance.     Dental advisory given  Plan Discussed with: CRNA and Surgeon  Anesthesia Plan Comments:        Anesthesia Quick Evaluation

## 2023-09-13 NOTE — H&P (Signed)
 REFERRING PHYSICIAN:  Eleanora Neighbor, MD   PROVIDER:  Elenora Gamma, MD   MRN: [id number] DOB: 25-Jun-1980    Subjective    Chief Complaint: New Consultation (External hems)       History of Present Illness: Robyn [name] is a 43 y.o. female who is seen today as an office consultation at the request of Dr. Cloretta Ned for evaluation of New Consultation (External hems) .   Patient with ongoing issues of prolapsing hemorrhoids.  This has been for many years but has gotten worse over the last few months.  She describes occasional bleeding.  Colonoscopy showed large internal hemorrhoids that were not amenable to rubber band ligation.  She is here today for further evaluation.  She reports significant pain with prolapse after most bowel movements.       Review of Systems: A complete review of systems was obtained from the patient.  I have reviewed this information and discussed as appropriate with the patient.  See HPI as well for other ROS.     Medical History:     Past Medical History:  Diagnosis Date   Diabetes mellitus type 2, uncomplicated (CMS/HHS-HCC) 2011   Essential hypertension 04/02/2012   Obesity 05/20/2012   Polycystic ovaries 08/21/2014         Patient Active Problem List  Diagnosis   Type 2 diabetes mellitus with hyperglycemia (CMS/HHS-HCC)   Elderly multigravida (HHS-HCC)   Essential hypertension   Obesity   Polycystic ovaries   Tobacco use disorder   ANA positive: Systemic lupus erythematosus (SLE) inhibitor           Past Surgical History:  Procedure Laterality Date   CERVICAL CERCLAGE VAGINAL   10/09/14   CESAREAN SECTION        x 2   Wisdom teeth extraction          No Known Allergies         Current Outpatient Medications on File Prior to Visit  Medication Sig Dispense Refill   atorvastatin (LIPITOR) 10 MG tablet Take 10 mg by mouth at bedtime       insulin ASPART PROTAMINE-ASPART (NOVOLOG MIX FLEXPEN 70/30) 100 unit/mL (70-30) pen  injector 60 units with breakfast, and 40 units with the evening meal, and pen needles 2/day       labetalol (TRANDATE) 200 MG tablet Take by mouth.       losartan-hydroCHLOROthiazide (HYZAAR) 50-12.5 mg tablet losartan 50 mg-hydrochlorothiazide 12.5 mg tablet TAKE 1 TABLET BY MOUTH ONCE DAILY       metFORMIN (GLUCOPHAGE) 1000 MG tablet metformin 1,000 mg tablet TAKE 1 TABLET BY MOUTH TWICE DAILY WITH MEALS       OZEMPIC 0.25 mg or 0.5 mg (2 mg/3 mL) pen injector INJECT 0.25 MG SUBCUTANENOUSLY EVERY WEEK FOR 4 WEEKS THEN INCREASE TO 0.5 MG EVERY WEEK       prenatal vit-iron fumarate-FA (PRENAVITE) tablet Take 1 tablet by mouth once daily.        No current facility-administered medications on file prior to visit.           Family History  Problem Relation Age of Onset   High blood pressure (Hypertension) Mother     Diabetes Father     Stroke Father        Social History        Tobacco Use  Smoking Status Former   Types: Cigarettes  Smokeless Tobacco Never      Social History  Socioeconomic History   Marital status: Single  Tobacco Use   Smoking status: Former      Types: Cigarettes   Smokeless tobacco: Never  Vaping Use   Vaping status: Every Day  Substance and Sexual Activity   Alcohol use: Yes   Drug use: Never    Social Drivers of Health        Food Insecurity: No Food Insecurity (07/12/2021)    Received from [facility]    Hunger Vital Sign     Worried About Running Out of Food in the Last Year: Never true     Ran Out of Food in the Last Year: Never true    Received from [facility], [facility]    Social Network      Objective:      Vitals:   09/13/23 0817  BP: (!) 146/80  Pulse: 87  Resp: 20  Temp: 98.9 F (37.2 C)  SpO2: 100%      Exam Gen: NAD CV: RRR Abd: soft Rectal: Circumferential skin tags without signs of inflammation     Labs, Imaging and Diagnostic Testing:   Procedure: Anoscopy Surgeon:  Andy Bannister After the risks and benefits were explained, written consent was obtained for above procedure.  A medical assistant chaperone was present thoroughout the entire procedure.  Anesthesia: none Diagnosis: hemorrhoids Findings: Grade 3 right posterior and right anterior internal hemorrhoids   Assessment and Plan:  Diagnoses and all orders for this visit:   Prolapsed internal hemorrhoids, grade 51     43 year old female with grade 3 hemorrhoids that are causing her quite a bit of discomfort.  I have recommended proceeding with transsphenoidal dearterialization.  We discussed this in detail including the fact that it would address her internal hemorrhoids and prolapse but would not address her external hemorrhoids and skin tags.  I feel that this would adequately treat her symptoms.  We discussed typical recovery time of 3 to 4 weeks.  Return to work in 2 to 3 weeks.  We discussed symptoms of bleeding pain and recurrence after surgery.  Unfortunately her insurance would not cover the [facility] procedure, so we decided to perform hemorrhoidal pexy instead.   Robyn Howells, MD Colon and Rectal Surgery [facility] Surgery

## 2023-09-13 NOTE — Anesthesia Postprocedure Evaluation (Signed)
 Anesthesia Post Note  Patient: Robyn [name]  Procedure(s) Performed: HEMORRHOIDOPEXY     Patient location during evaluation: PACU Anesthesia Type: General Level of consciousness: awake and alert, patient cooperative and oriented Pain management: pain level controlled Vital Signs Assessment: post-procedure vital signs reviewed and stable Respiratory status: spontaneous breathing, nonlabored ventilation and respiratory function stable Cardiovascular status: blood pressure returned to baseline and stable Postop Assessment: no apparent nausea or vomiting, able to ambulate and adequate PO intake Anesthetic complications: no   No notable events documented.  Last Vitals:  Vitals:   09/13/23 1345 09/13/23 1400  BP: 137/81   Pulse: 87 87  Resp:    Temp:  36.8 C  SpO2:  96%    Last Pain:  Vitals:   09/13/23 1315  TempSrc:   PainSc: 5                  Kamari Bilek,E. Shatima Zalar

## 2023-09-13 NOTE — Anesthesia Procedure Notes (Signed)
 Procedure Name: LMA Insertion Date/Time: 09/13/2023 10:26 AM  Performed by: [address], Juancarlos Crescenzo A, CRNAPre-anesthesia Checklist: Patient identified, Emergency Drugs available, Suction available, Patient being monitored and Timeout performed Patient Re-evaluated:Patient Re-evaluated prior to induction Oxygen Delivery Method: Circle system utilized Preoxygenation: Pre-oxygenation with 100% oxygen Induction Type: IV induction LMA Size: 5.0 Tube secured with: Tape

## 2023-09-13 NOTE — Discharge Instructions (Addendum)

## 2023-09-14 ENCOUNTER — Encounter (HOSPITAL_COMMUNITY): Payer: Self-pay | Admitting: General Surgery

## 2023-10-01 ENCOUNTER — Other Ambulatory Visit (HOSPITAL_COMMUNITY)
Admission: RE | Admit: 2023-10-01 | Discharge: 2023-10-01 | Disposition: A | Discharge status: Home / Self Care | Source: Ambulatory Visit | Attending: Obstetrics and Gynecology | Admitting: Obstetrics and Gynecology

## 2023-10-01 ENCOUNTER — Ambulatory Visit: Admitting: Obstetrics and Gynecology

## 2023-10-01 ENCOUNTER — Encounter: Payer: Self-pay | Admitting: Obstetrics and Gynecology

## 2023-10-01 ENCOUNTER — Other Ambulatory Visit: Payer: Self-pay

## 2023-10-01 VITALS — BP 130/84 | HR 80 | Wt 282.0 lb

## 2023-10-01 DIAGNOSIS — Z124 Encounter for screening for malignant neoplasm of cervix: Secondary | ICD-10-CM

## 2023-10-01 DIAGNOSIS — Z1231 Encounter for screening mammogram for malignant neoplasm of breast: Secondary | ICD-10-CM

## 2023-10-01 DIAGNOSIS — Z1331 Encounter for screening for depression: Secondary | ICD-10-CM | POA: Diagnosis not present

## 2023-10-01 DIAGNOSIS — N907 Vulvar cyst: Secondary | ICD-10-CM | POA: Diagnosis not present

## 2023-10-01 DIAGNOSIS — Z3009 Encounter for other general counseling and advice on contraception: Secondary | ICD-10-CM

## 2023-10-01 DIAGNOSIS — E282 Polycystic ovarian syndrome: Secondary | ICD-10-CM

## 2023-10-01 NOTE — Progress Notes (Signed)
 NEW GYNECOLOGY PATIENT Patient name: Robyn [name] MRN [id number]  Date of birth: November 21, 1980 Chief Complaint:   Gynecologic Exam     History:  Robyn [name] is a 43 y.o. [id number] being seen today for labial cyst, pap, sterilization discussion.    Normal menses, monthly, 5-6 days. Sexually active, female partner, not using contraception interested in tubal  Discussed the use of AI scribe software for clinical note transcription with the patient, who gave verbal consent to proceed.  History of Present Illness Robyn [name] is a 43 year old female who presents with a labial cyst and discussion of tubal ligation.  She has a labial cyst on the right side, initially thought to be an ingrown hair, which has grown to the size of a pea. There is no pain or discomfort, and she is not currently sexually active. She is concerned about its cosmetic appearance, especially with potential new sexual partners. A previous OB mentioned removal if it became too large, but she prefers not to wait.  She desires a tubal ligation due to concerns about future pregnancies. She has a history of three C-sections, with significant scar tissue noted during the last procedure. She is worried about becoming pregnant again, especially as she plans to start nursing school and has a history of PCOS. Her menstrual cycle has been regular for the past nine years without birth control, which she avoids due to weight gain concerns. Her periods corrected themselves after her last child was born and she has completed childbearing.  She had a normal Pap smear in 2023 at [facility] She recently underwent comprehensive STI testing and does not require additional testing at this time.     Gynecologic History Patient's last menstrual period was 09/24/2023 (exact date). Contraception: none Last Pap: reported 2023 normal Last Mammogram: none seen on file Last Colonoscopy: n/a  Obstetric History OB History  Gravida Para  Term Preterm AB Living  5 3 0 3 2 3   SAB IAB Ectopic Multiple Live Births  1 1 0 0 3    # Outcome Date GA Lbr Len/2nd Weight Sex Type Anes PTL Lv  5 Preterm 03/23/15 [redacted]w[redacted]d  8 lb 0.8 oz (3.65 kg) F CS-LVertical Spinal  LIV  4 Preterm 03/05/10 [redacted]w[redacted]d    CS-Unspec   LIV  3 Preterm 01/28/95 [redacted]w[redacted]d    CS-Unspec   LIV  2 IAB           1 SAB             Past Medical History:  Diagnosis Date   Anxiety    h/o panic attacks   Arthritis    Diabetes mellitus without complication (HCC)    diet controlled    Diverticulosis 02/16/2019   Herpes    Hypertension    Left ovarian cyst 11/29/2011   PCOS (polycystic ovarian syndrome)    Pregnancy induced hypertension    Smoker 11/29/2011    Past Surgical History:  Procedure Laterality Date   CERVICAL CERCLAGE N/A 10/09/2014   Procedure: CERCLAGE CERVICAL;  Surgeon: Renea Carrion, MD;  Location: [facility];  Service: Gynecology;  Laterality: N/A;   CESAREAN SECTION     C/S x 2   CESAREAN SECTION WITH BILATERAL TUBAL LIGATION Bilateral 03/23/2015   Procedure: CESAREAN SECTION WITH BILATERAL TUBAL LIGATION;  Surgeon: Renea Carrion, MD;  Location: [facility];  Service: Obstetrics;  Laterality: Bilateral;  Amt OK'd to move 02/25/2015   DILATION AND CURETTAGE OF UTERUS  2006   in [place]  [place]   FLEXIBLE SIGMOIDOSCOPY N/A 05/08/2019   Procedure: FLEXIBLE SIGMOIDOSCOPY;  Surgeon: Joyce Nixon, MD;  Location: [facility];  Service: Endoscopy;  Laterality: N/A;   HEMORRHOID SURGERY N/A 09/13/2023   Procedure: HEMORRHOIDOPEXY;  Surgeon: Joyce Nixon, MD;  Location: [facility];  Service: General;  Laterality: N/A;   IR RADIOLOGIST EVAL & MGMT  02/20/2019   IR RADIOLOGIST EVAL & MGMT  03/06/2019   IR RADIOLOGIST EVAL & MGMT  08/21/2019   WISDOM TOOTH EXTRACTION      Current Outpatient Medications on File Prior to Visit  Medication Sig Dispense Refill   atorvastatin  (LIPITOR ) 10 MG tablet Take 10 mg by mouth at bedtime.     cyanocobalamin (VITAMIN B12) 500 MCG  tablet Take 500 mcg by mouth daily.     ibuprofen  (ADVIL ) 800 MG tablet Take 800 mg by mouth every 8 (eight) hours as needed for mild pain (pain score 1-3) or moderate pain (pain score 4-6).     lisdexamfetamine (VYVANSE) 20 MG capsule Take 10 mg by mouth daily.     losartan -hydrochlorothiazide  (HYZAAR) 100-25 MG tablet Take 1 tablet by mouth daily.     metFORMIN  (GLUCOPHAGE -XR) 500 MG 24 hr tablet Take 1,000 mg by mouth 2 (two) times daily. (Patient taking differently: Take 500 mg by mouth 2 (two) times daily.)     OZEMPIC, 0.25 OR 0.5 MG/DOSE, 2 MG/3ML SOPN Inject 1 Dose into the skin once a week.     ondansetron  (ZOFRAN ) 4 MG tablet Take 1 tablet (4 mg total) by mouth every 4 (four) hours as needed for nausea or vomiting. (Patient not taking: Reported on 10/01/2023) 12 tablet 0   pantoprazole  (PROTONIX ) 40 MG tablet Take 1 tablet (40 mg total) by mouth daily for 14 days. 14 tablet 0   sucralfate  (CARAFATE ) 1 g tablet Take 1 tablet (1 g total) by mouth with breakfast, with lunch, and with evening meal for 7 days. 21 tablet 0   sucralfate  (CARAFATE ) 1 g tablet Take 1 tablet (1 g total) by mouth 2 (two) times daily for 14 days. 28 tablet 0   Vitamin D, Ergocalciferol, (DRISDOL) 1.25 MG (50000 UNIT) CAPS capsule Take 50,000 Units by mouth every 7 (seven) days.     No current facility-administered medications on file prior to visit.    No Known Allergies  Social History:  reports that she has been smoking cigarettes. She has a 6.5 pack-year smoking history. She has never been exposed to tobacco smoke. She has never used smokeless tobacco. She reports current alcohol use. She reports that she does not use drugs.  Family History  Problem Relation Age of Onset   Hypertension Mother    Diabetes Father    Stroke Father    Cancer Maternal Grandmother    Cancer Maternal Uncle     The following portions of the patient's history were reviewed and updated as appropriate: allergies, current medications,  past family history, past medical history, past social history, past surgical history and problem list.  Review of Systems Pertinent items noted in HPI and remainder of comprehensive ROS otherwise negative.  Physical Exam:  BP 130/84   Pulse 80   Wt 282 lb (127.9 kg)   LMP 09/24/2023 (Exact Date) Comment: POC pregnancy test (-) negative on 09/13/2023  BMI 44.17 kg/m  Physical Exam Vitals and nursing note reviewed. Exam conducted with a chaperone present.  Constitutional:      Appearance: Normal appearance.  Pulmonary:     Effort: Pulmonary effort  is normal.  Abdominal:     Palpations: Abdomen is soft.  Genitourinary:    General: Normal vulva.     Exam position: Lithotomy position.     Comments: 8mm x 3mm inclusion cyst on inner labia majora, nonerythematous, nontender, mobile Neurological:     Mental Status: She is alert.        Assessment and Plan:    Assessment & Plan Polycystic Ovary Syndrome (PCOS) PCOS with regulated menstrual cycle post childbirth. Discussed conception challenges post-40 and permanent contraception options. Explained tubal ligation feasibility despite potential scar tissue from C-sections. - Sign Medicaid consent form for tubal ligation. - Schedule laparoscopic tubal ligation. - Conduct preoperative appointment to discuss risks, alternatives, and surgical details.  Inclusion cyst Pea-sized inclusion cyst on right labia, not causing discomfort. Removal is cosmetic and not medically necessary. Patient prefers monitoring. - Monitor the inclusion cyst for changes or discomfort.   Routine preventative health maintenance measures emphasized. Please refer to After Visit Summary for other counseling recommendations.   Follow-up: No follow-ups on file.      Kiki Pelton, MD Obstetrician & Gynecologist, Faculty [facility] for [employer], [facility]

## 2023-10-03 ENCOUNTER — Telehealth: Payer: Self-pay

## 2023-10-03 NOTE — Telephone Encounter (Signed)
 I called patient to schedule surgery w/ Dr. Elester Grim on 11/26/23. I left a detailed voicemail and asking patient to call me at [phone].

## 2023-10-08 ENCOUNTER — Encounter: Payer: Self-pay | Admitting: Obstetrics and Gynecology

## 2023-10-08 LAB — CYTOLOGY - PAP
Adequacy: ABSENT
Comment: NEGATIVE
Diagnosis: NEGATIVE
High risk HPV: NEGATIVE

## 2023-10-09 ENCOUNTER — Ambulatory Visit: Payer: Self-pay

## 2023-10-09 NOTE — Telephone Encounter (Addendum)
 Called pt regarding scheduling mammogram. Pt stated she had one earlier this year and does not need another one. Pt states she recently changed doctors and her PCP sent all the referrals for her mammogram. Pt stated she is going to call the location she had the mammogram to get records so it can be accurately recorded in her chart. Pt provided with office phone number at her request. Pt denies any further questions or concerns.   Arnell Lange, RN    ----- Message from Kiki Pelton sent at 10/08/2023  5:41 PM EDT ----- Schedule mammo

## 2023-11-03 ENCOUNTER — Ambulatory Visit: Admission: EM | Admit: 2023-11-03 | Discharge: 2023-11-03 | Disposition: A | Discharge status: Home / Self Care

## 2023-11-03 ENCOUNTER — Ambulatory Visit (INDEPENDENT_AMBULATORY_CARE_PROVIDER_SITE_OTHER)

## 2023-11-03 ENCOUNTER — Encounter: Payer: Self-pay | Admitting: Emergency Medicine

## 2023-11-03 DIAGNOSIS — E119 Type 2 diabetes mellitus without complications: Secondary | ICD-10-CM | POA: Insufficient documentation

## 2023-11-03 DIAGNOSIS — N926 Irregular menstruation, unspecified: Secondary | ICD-10-CM | POA: Insufficient documentation

## 2023-11-03 DIAGNOSIS — O200 Threatened abortion: Secondary | ICD-10-CM | POA: Insufficient documentation

## 2023-11-03 DIAGNOSIS — R058 Other specified cough: Secondary | ICD-10-CM | POA: Diagnosis not present

## 2023-11-03 DIAGNOSIS — R051 Acute cough: Secondary | ICD-10-CM

## 2023-11-03 DIAGNOSIS — Z3A32 32 weeks gestation of pregnancy: Secondary | ICD-10-CM | POA: Insufficient documentation

## 2023-11-03 DIAGNOSIS — O10919 Unspecified pre-existing hypertension complicating pregnancy, unspecified trimester: Secondary | ICD-10-CM | POA: Insufficient documentation

## 2023-11-03 DIAGNOSIS — N760 Acute vaginitis: Secondary | ICD-10-CM | POA: Insufficient documentation

## 2023-11-03 MED ORDER — BENZONATATE 100 MG PO CAPS: 100.0000 mg | ORAL_CAPSULE | Freq: Three times a day (TID) | ORAL | 0 refills | Status: AC

## 2023-11-03 MED ORDER — PROMETHAZINE-DM 6.25-15 MG/5ML PO SYRP: 5.0000 mL | ORAL_SOLUTION | Freq: Every evening | ORAL | 0 refills | Status: AC | PRN

## 2023-11-03 MED ORDER — PREDNISONE 20 MG PO TABS: 40.0000 mg | ORAL_TABLET | Freq: Every day | ORAL | 0 refills | Status: AC

## 2023-11-03 NOTE — Discharge Instructions (Addendum)
 Chest x-ray done today is negative for an acute process.  Cough is likely postviral syndrome cough and we can treat this with medication for cough and anti-inflammatory medication like steroids.  We will treat with the following: Prednisone  40 mg (2 tablets) once daily for 5 days. Take this in the "morning" time.  This is a steroid to help with inflammation.  Benzonatate (tessalon) 100 mg every 8 hours as needed for cough.   Promethazine  DM 5 mL every before bed as needed for cough.  Use caution as this medication can cause drowsiness.  Return to urgent care or PCP if symptoms worsen or fail to resolve.

## 2023-11-03 NOTE — ED Provider Notes (Signed)
 [facility]    CSN: [id number] Arrival date & time: 11/03/23  1341      History   Chief Complaint Chief Complaint  Patient presents with   Cough    HPI Robyn [name] is a 43 y.o. female.   43 year old female who presents urgent care with complaints of a cough that has been persistent for a week.  She did initially have an upper respiratory type infection but her symptoms resolved except for the cough.  She reports that the cough is so bad that it will wake her up at night.  She denies shortness of breath, chest pain, fevers, chills, nausea, vomiting.  Her cough is not productive however she does feel like there is something in her chest.     Cough Associated symptoms: no chest pain, no chills, no ear pain, no fever, no rash, no shortness of breath and no sore throat     Past Medical History:  Diagnosis Date   Anxiety    h/o panic attacks   Arthritis    Diabetes mellitus without complication (HCC)    diet controlled    Diverticulosis 02/16/2019   Herpes    Hypertension    Left ovarian cyst 11/29/2011   PCOS (polycystic ovarian syndrome)    Pregnancy induced hypertension    Smoker 11/29/2011    Patient Active Problem List   Diagnosis Date Noted   Type 2 diabetes mellitus (HCC) 11/03/2023   Threatened abortion in first trimester 11/03/2023   Premature delivery 11/03/2023   Irregular periods 11/03/2023   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 11/03/2023   Bacterial vaginosis 11/03/2023   [redacted] weeks gestation of pregnancy 11/03/2023   Pulsatile tinnitus of left ear 11/15/2021   Asymmetrical hearing loss 11/15/2021   Noncompliance with diabetes treatment 09/16/19   At risk for sudden cardiac death 16-Sep-2019   Genital herpes simplex 09/04/2019   Tubo-ovarian abscess 08/06/2019   TOA (tubo-ovarian abscess) 08/05/2019   Colonic diverticular abscess 02/10/2019   Diverticulitis 02/09/2019   Noncompliance with medication regimen 01/15/2019    Hypertriglyceridemia 11/20/2016   S/P repeat low transverse C-section 03/23/2015   Hypertension - on Labetalol  200 mg po bid 11/22/2014   Diabetes mellitus type 2, uncontrolled, without complications -- dx'd in 2011 11/22/2014   Hx of incompetent cervix, currently pregnant - cerclage placed on 10/09/14 by Dr. Adelene Homer 11/22/2014   History of preterm delivery x 2 - weekly 17P injections - first injection at 16.5 wks 11/22/2014   Elderly multigravida 11/22/2014   Cervical incompetence 10/02/2014   PCOS (polycystic ovarian syndrome) 08/21/2014   Herpes 08/21/2014   ANA positive 08/21/2014   Previous cesarean delivery, antepartum condition or complication x 2--previous vertical incision 08/21/2014   Systemic lupus erythematosus (SLE) inhibitor (HCC) 08/21/2014   Type 2 diabetes mellitus with hyperglycemia (HCC) 08/19/2013   Obesity 05/20/2012    Past Surgical History:  Procedure Laterality Date   CERVICAL CERCLAGE N/A 10/09/2014   Procedure: CERCLAGE CERVICAL;  Surgeon: Renea Carrion, MD;  Location: [facility];  Service: Gynecology;  Laterality: N/A;   CESAREAN SECTION     C/S x 2   CESAREAN SECTION WITH BILATERAL TUBAL LIGATION Bilateral 03/23/2015   Procedure: CESAREAN SECTION WITH BILATERAL TUBAL LIGATION;  Surgeon: Renea Carrion, MD;  Location: [facility];  Service: Obstetrics;  Laterality: Bilateral;  Amt OK'd to move 02/25/2015   DILATION AND CURETTAGE OF UTERUS  2006   in [place]    FLEXIBLE SIGMOIDOSCOPY N/A 05/08/2019   Procedure: FLEXIBLE SIGMOIDOSCOPY;  Surgeon: Joyce Nixon, MD;  Location: [facility];  Service: Endoscopy;  Laterality: N/A;   HEMORRHOID SURGERY N/A 09/13/2023   Procedure: HEMORRHOIDOPEXY;  Surgeon: Joyce Nixon, MD;  Location: [facility];  Service: General;  Laterality: N/A;   IR RADIOLOGIST EVAL & MGMT  02/20/2019   IR RADIOLOGIST EVAL & MGMT  03/06/2019   IR RADIOLOGIST EVAL & MGMT  08/21/2019   WISDOM TOOTH EXTRACTION      OB History     Gravida  5   Para   3   Term  0   Preterm  3   AB  2   Living  3      SAB  1   IAB  1   Ectopic  0   Multiple  0   Live Births  3            Home Medications    Prior to Admission medications   Medication Sig Start Date End Date Taking? Authorizing Provider  benzonatate (TESSALON) 100 MG capsule Take 1 capsule (100 mg total) by mouth every 8 (eight) hours. 11/03/23  Yes Sonoma Firkus A, PA-C  pantoprazole  (PROTONIX ) 20 MG tablet Take 40 mg by mouth daily. 09/10/23  Yes [provider]  predniSONE  (DELTASONE ) 20 MG tablet Take 2 tablets (40 mg total) by mouth daily with breakfast for 5 days. 11/03/23 11/08/23 Yes Lailoni Baquera A, PA-C  promethazine -dextromethorphan (PROMETHAZINE -DM) 6.25-15 MG/5ML syrup Take 5 mLs by mouth at bedtime as needed for cough. 11/03/23  Yes Chasin Findling A, PA-C  atorvastatin  (LIPITOR ) 10 MG tablet Take 10 mg by mouth at bedtime. 07/16/23   [provider]  cyanocobalamin (VITAMIN B12) 500 MCG tablet Take 500 mcg by mouth daily.    [provider]  ibuprofen  (ADVIL ) 800 MG tablet Take 800 mg by mouth every 8 (eight) hours as needed for mild pain (pain score 1-3) or moderate pain (pain score 4-6).    [provider]  lisdexamfetamine (VYVANSE) 20 MG capsule Take 10 mg by mouth daily.    [provider]  losartan -hydrochlorothiazide  (HYZAAR) 100-25 MG tablet Take 1 tablet by mouth daily.    [provider]  metFORMIN  (GLUCOPHAGE -XR) 500 MG 24 hr tablet Take 1,000 mg by mouth 2 (two) times daily. Patient taking differently: Take 500 mg by mouth 2 (two) times daily. 12/23/18   [provider]  ondansetron  (ZOFRAN ) 4 MG tablet Take 1 tablet (4 mg total) by mouth every 4 (four) hours as needed for nausea or vomiting. Patient not taking: Reported on 10/01/2023 09/09/23   Russella Courts A, DO  OZEMPIC, 0.25 OR 0.5 MG/DOSE, 2 MG/3ML SOPN Inject 1 Dose into the skin once a week. 07/16/23   [provider]   pantoprazole  (PROTONIX ) 40 MG tablet Take 1 tablet (40 mg total) by mouth daily for 14 days. 09/09/23 09/23/23  Teddi Favors, DO  sucralfate  (CARAFATE ) 1 g tablet Take 1 tablet (1 g total) by mouth with breakfast, with lunch, and with evening meal for 7 days. 09/09/23 09/16/23  Russella Courts A, DO  sucralfate  (CARAFATE ) 1 g tablet Take 1 tablet (1 g total) by mouth 2 (two) times daily for 14 days. 09/09/23 09/23/23  Curatolo, Adam, DO  Vitamin D, Ergocalciferol, (DRISDOL) 1.25 MG (50000 UNIT) CAPS capsule Take 50,000 Units by mouth every 7 (seven) days.    [provider]    Family History Family History  Problem Relation Age of Onset   Hypertension Mother  Diabetes Father    Stroke Father    Cancer Maternal Grandmother    Cancer Maternal Uncle     Social History Social History   Tobacco Use   Smoking status: Every Day    Current packs/day: 0.50    Average packs/day: 0.5 packs/day for 13.0 years (6.5 ttl pk-yrs)    Types: Cigarettes    Passive exposure: Never   Smokeless tobacco: Never  Vaping Use   Vaping status: Former  Substance Use Topics   Alcohol use: Yes    Comment: socially and rarely   Drug use: No     Allergies   Patient has no known allergies.   Review of Systems Review of Systems  Constitutional:  Negative for chills and fever.  HENT:  Negative for ear pain and sore throat.   Eyes:  Negative for pain and visual disturbance.  Respiratory:  Positive for cough. Negative for shortness of breath.   Cardiovascular:  Negative for chest pain and palpitations.  Gastrointestinal:  Negative for abdominal pain and vomiting.  Genitourinary:  Negative for dysuria and hematuria.  Musculoskeletal:  Negative for arthralgias and back pain.  Skin:  Negative for color change and rash.  Neurological:  Negative for seizures and syncope.  All other systems reviewed and are negative.    Physical Exam Triage Vital Signs ED Triage Vitals  Encounter Vitals Group      BP 11/03/23 1408 (!) 147/86     Systolic BP Percentile --      Diastolic BP Percentile --      Pulse Rate 11/03/23 1408 78     Resp 11/03/23 1408 18     Temp 11/03/23 1408 98.5 F (36.9 C)     Temp Source 11/03/23 1408 Oral     SpO2 11/03/23 1408 98 %     Weight 11/03/23 1407 281 lb 15.5 oz (127.9 kg)     Height --      Head Circumference --      Peak Flow --      Pain Score 11/03/23 1406 0     Pain Loc --      Pain Education --      Exclude from Growth Chart --    No data found.  Updated Vital Signs BP (!) 147/86 (BP Location: Left Arm)   Pulse 78   Temp 98.5 F (36.9 C) (Oral)   Resp 18   Wt 281 lb 15.5 oz (127.9 kg)   LMP 10/08/2023 (Exact Date)   SpO2 98%   BMI 44.16 kg/m   Visual Acuity Right Eye Distance:   Left Eye Distance:   Bilateral Distance:    Right Eye Near:   Left Eye Near:    Bilateral Near:     Physical Exam Vitals and nursing note reviewed.  Constitutional:      General: She is not in acute distress.    Appearance: She is well-developed.  HENT:     Head: Normocephalic and atraumatic.  Eyes:     Conjunctiva/sclera: Conjunctivae normal.  Cardiovascular:     Rate and Rhythm: Normal rate and regular rhythm.     Heart sounds: No murmur heard. Pulmonary:     Effort: Pulmonary effort is normal. No respiratory distress.     Breath sounds: Examination of the right-lower field reveals wheezing. Wheezing present. No decreased breath sounds or rhonchi.  Abdominal:     Palpations: Abdomen is soft.     Tenderness: There is no abdominal tenderness.  Musculoskeletal:  General: No swelling.     Cervical back: Neck supple.  Skin:    General: Skin is warm and dry.     Capillary Refill: Capillary refill takes less than 2 seconds.  Neurological:     Mental Status: She is alert.  Psychiatric:        Mood and Affect: Mood normal.      UC Treatments / Results  Labs (all labs ordered are listed, but only abnormal results are displayed) Labs  Reviewed - No data to display  EKG   Radiology DG Chest 2 View Result Date: 11/03/2023 CLINICAL DATA:  Cough. EXAM: CHEST - 2 VIEW COMPARISON:  04/11/2023. FINDINGS: Low lung volume. Bilateral lung fields are clear. Bilateral costophrenic angles are clear. Normal cardio-mediastinal silhouette. No acute osseous abnormalities. The soft tissues are within normal limits. IMPRESSION: No active cardiopulmonary disease. Electronically Signed   By: Beula Brunswick M.D.   On: 11/03/2023 14:50    Procedures Procedures (including critical care time)  Medications Ordered in UC Medications - No data to display  Initial Impression / Assessment and Plan / UC Course  I have reviewed the triage vital signs and the nursing notes.  Pertinent labs & imaging results that were available during my care of the patient were reviewed by me and considered in my medical decision making (see chart for details).     Acute cough - Plan: DG Chest 2 View, DG Chest 2 View  Post-viral cough syndrome   Chest x-ray done today due to duration of cough, severity of cough and slight wheeze in the right lower lobe. Chest x-ray done today is negative for an acute process.  Cough is likely postviral syndrome cough and we can treat this with medication for cough and anti-inflammatory medication like steroids.  We will treat with the following: Prednisone  40 mg (2 tablets) once daily for 5 days. Take this in the "morning" time.  This is a steroid to help with inflammation.  Benzonatate (tessalon) 100 mg every 8 hours as needed for cough.   Promethazine  DM 5 mL every before bed as needed for cough.  Use caution as this medication can cause drowsiness.  Return to urgent care or PCP if symptoms worsen or fail to resolve.    Final Clinical Impressions(s) / UC Diagnoses   Final diagnoses:  Acute cough  Post-viral cough syndrome     Discharge Instructions      Chest x-ray done today is negative for an acute process.  Cough  is likely postviral syndrome cough and we can treat this with medication for cough and anti-inflammatory medication like steroids.  We will treat with the following: Prednisone  40 mg (2 tablets) once daily for 5 days. Take this in the "morning" time.  This is a steroid to help with inflammation.  Benzonatate (tessalon) 100 mg every 8 hours as needed for cough.   Promethazine  DM 5 mL every before bed as needed for cough.  Use caution as this medication can cause drowsiness.  Return to urgent care or PCP if symptoms worsen or fail to resolve.     ED Prescriptions     Medication Sig Dispense Auth. Provider   benzonatate (TESSALON) 100 MG capsule Take 1 capsule (100 mg total) by mouth every 8 (eight) hours. 21 capsule Larose Batres A, PA-C   promethazine -dextromethorphan (PROMETHAZINE -DM) 6.25-15 MG/5ML syrup Take 5 mLs by mouth at bedtime as needed for cough. 180 mL Zelpha Messing A, PA-C   predniSONE  (DELTASONE ) 20 MG tablet  Take 2 tablets (40 mg total) by mouth daily with breakfast for 5 days. 10 tablet Kreg Pesa, [place]      PDMP not reviewed this encounter.   Kreg Pesa, [place] 11/03/23 [phone]

## 2023-11-03 NOTE — ED Triage Notes (Signed)
 Pt presents c/o productive cough x 1 week. Pt denies sore throat. She states she was sick about a week ago and all sxs improved except the cough.

## 2024-01-09 ENCOUNTER — Telehealth: Payer: Self-pay

## 2024-01-09 NOTE — Telephone Encounter (Signed)
 I called patient to see if she's available for surgery with Dr. Jeralyn on 02/11/24. An error message stated my call could not be completed at this time, and to try again later.

## 2024-01-17 ENCOUNTER — Telehealth: Payer: Self-pay

## 2024-01-17 NOTE — Telephone Encounter (Signed)
 Patient called back in regards to scheduling surgery with Dr. Jeralyn. The dates provided for Sept and Oct were not feasible due to her children birthdays. Patient will wait for November schedule.

## 2024-01-17 NOTE — Telephone Encounter (Signed)
 I called patient to see if she's available for surgery w/ Dr. Jeralyn on 03/20/24. I left a voicemail requesting patient call me back to schedule.

## 2024-03-06 ENCOUNTER — Encounter: Payer: Self-pay | Admitting: Emergency Medicine

## 2024-03-06 ENCOUNTER — Ambulatory Visit
Admission: EM | Admit: 2024-03-06 | Discharge: 2024-03-06 | Disposition: A | Discharge status: Home / Self Care | Attending: Family Medicine | Admitting: Family Medicine

## 2024-03-06 DIAGNOSIS — N898 Other specified noninflammatory disorders of vagina: Secondary | ICD-10-CM | POA: Diagnosis present

## 2024-03-06 NOTE — Discharge Instructions (Signed)
We have sent testing for various causes of vaginal infections. We will notify you of any positive results once they are received. If required, we will prescribe any medications you might need.  Please refrain from all sexual activity for at least the next seven days.  

## 2024-03-06 NOTE — ED Triage Notes (Signed)
 Pt presents c/o vaginal itching and discharge x 2 days. Pt reports she is currently is on antibiotics for yeast infection but states the itching started yesterday. Pt is not sure if she is having an allergic reaction to the meds. Pt denies any additional sxs.

## 2024-03-06 NOTE — ED Provider Notes (Signed)
 [facility]   [id number] 03/06/24 Arrival Time: 1116  ASSESSMENT & PLAN:  1. Vaginal itching    Continue fluconazole .    Discharge Instructions      We have sent testing for various causes of vaginal infections. We will notify you of any positive results once they are received. If required, we will prescribe any medications you might need.  Please refrain from all sexual activity for at least the next seven days.     Without s/s of PID.  Labs Reviewed  CERVICOVAGINAL ANCILLARY ONLY     Will notify of any positive results. Instructed to refrain from sexual activity for at least seven days.  Reviewed expectations re: course of current medical issues. Questions answered. Outlined signs and symptoms indicating need for more acute intervention. Patient verbalized understanding. After Visit Summary given.   SUBJECTIVE:  Robyn [name] is a 43 y.o. female who presents with complaint of vaginal irritation. On Macrobid for UTI. Has taken one Diflucan . With vaginal itching. Slight clear discharge noted yesterday.   Patient's last menstrual period was 02/27/2024 (exact date).   OBJECTIVE:  Vitals:   03/06/24 1219 03/06/24 1220  BP:  130/85  Pulse:  88  Resp:  18  Temp:  98.2 F (36.8 C)  TempSrc:  Oral  SpO2:  98%  Weight: 127.9 kg      General appearance: alert, cooperative, appears stated age and no distress Lungs: unlabored respirations; speaks full sentences without difficulty Back: no CVA tenderness; FROM at waist Abdomen: soft, non-tender GU: deferred Skin: warm and dry Psychological: alert and cooperative; normal mood and affect.    Labs Reviewed  CERVICOVAGINAL ANCILLARY ONLY    No Known Allergies  Past Medical History:  Diagnosis Date   Anxiety    h/o panic attacks   Arthritis    Diabetes mellitus without complication (HCC)    diet controlled    Diverticulosis 02/16/2019   Herpes    Hypertension    Left ovarian cyst  11/29/2011   PCOS (polycystic ovarian syndrome)    Pregnancy induced hypertension    Smoker 11/29/2011   Family History  Problem Relation Age of Onset   Hypertension Mother    Diabetes Father    Stroke Father    Cancer Maternal Grandmother    Cancer Maternal Uncle    Social History   Socioeconomic History   Marital status: Legally Separated    Spouse name: Not on file   Number of children: Not on file   Years of education: Not on file   Highest education level: Not on file  Occupational History   Not on file  Tobacco Use   Smoking status: Every Day    Current packs/day: 0.50    Average packs/day: 0.5 packs/day for 13.0 years (6.5 ttl pk-yrs)    Types: Cigarettes    Passive exposure: Never   Smokeless tobacco: Never  Vaping Use   Vaping status: Former  Substance and Sexual Activity   Alcohol use: Yes    Comment: socially and rarely   Drug use: No   Sexual activity: Yes    Partners: Male    Birth control/protection: None  Other Topics Concern   Not on file  Social History Narrative   Not on file   Social Drivers of Health   Financial Resource Strain: Not on file  Food Insecurity: Food Insecurity Present (10/01/2023)   Hunger Vital Sign    Worried About Running Out of Food in the Last Year: Sometimes true  Ran Out of Food in the Last Year: Sometimes true  Transportation Needs: Not on file  Physical Activity: Not on file  Stress: Not on file  Social Connections: Unknown (10/06/2021)   [facility]   Social Network    Social Network: Not on file  Intimate Partner Violence: Unknown (09/01/2021)   Received from [facility]   HITS    Physically Hurt: Not on file    Insult or Talk Down To: Not on file    Threaten Physical Harm: Not on file    Scream or Curse: Not on file           Rolinda Rogue, MD 03/06/24 1313

## 2024-03-07 LAB — CERVICOVAGINAL ANCILLARY ONLY
Bacterial Vaginitis (gardnerella): NEGATIVE
Candida Glabrata: NEGATIVE
Candida Vaginitis: NEGATIVE
Chlamydia: NEGATIVE
Comment: NEGATIVE
Comment: NEGATIVE
Comment: NEGATIVE
Comment: NEGATIVE
Comment: NEGATIVE
Comment: NORMAL
Neisseria Gonorrhea: NEGATIVE
Trichomonas: POSITIVE — AB

## 2024-03-10 ENCOUNTER — Telehealth: Payer: Self-pay

## 2024-03-10 ENCOUNTER — Ambulatory Visit (HOSPITAL_COMMUNITY): Payer: Self-pay

## 2024-03-10 MED ORDER — METRONIDAZOLE 500 MG PO TABS: 500.0000 mg | ORAL_TABLET | Freq: Two times a day (BID) | ORAL | 0 refills | Status: AC

## 2024-03-10 NOTE — Telephone Encounter (Signed)
-----   Message from Benita Gains sent at 03/05/2024  8:17 AM EDT ----- Regarding: RE: Consent Form Expiring Soon Good morning,  Please have the patient sign a new medicaid consent form.  Thanks, Dejuana ----- Message ----- From: Kelle Caroleen RAMAN Sent: 03/03/2024   4:58 PM EDT To: Mliss LOISE Rinne, PA-C; Benita Gains Subject: Consent Form Expiring Soon                     Benita, I saw your telephone encounter for this patient about how she would like a November date for surgery. She will need to have her surgery on or before 03/28/2024, or she will need to resign a consent form. Please reach out to the office to have her resign is she is not agreeable to an October date.

## 2024-03-10 NOTE — Telephone Encounter (Signed)
 RN attempted to call pt to inform her that she will need to come by office to sign a new sterilization consent due to her last one expiring before future surgery date.  Left voicemail with office call back number.     [place], RN

## 2024-03-10 NOTE — Telephone Encounter (Signed)
 Pt called requesting meds be called in because she st's they were never called in.  Attempted to call pt. Call went straight to voice mail.  No message left at this time

## 2024-03-12 NOTE — Telephone Encounter (Signed)
 RN made second attempt to call pt in regards to needing a new Consent for Sterilzation form.  Left voicemail with office call back number.  MyChart message sent.    [place], RN
